# Patient Record
Sex: Male | Born: 1955 | Race: White | Hispanic: No | Marital: Married | Smoking: Current every day smoker
Health system: Southern US, Community
[De-identification: ages and names within clinical notes are randomized; demographics above are authoritative.]

## PROBLEM LIST (undated history)

## (undated) DIAGNOSIS — I639 Cerebral infarction, unspecified: Secondary | ICD-10-CM

## (undated) DIAGNOSIS — Z860100 Personal history of colon polyps, unspecified: Secondary | ICD-10-CM

## (undated) DIAGNOSIS — E785 Hyperlipidemia, unspecified: Secondary | ICD-10-CM

## (undated) DIAGNOSIS — R7989 Other specified abnormal findings of blood chemistry: Secondary | ICD-10-CM

## (undated) DIAGNOSIS — I10 Essential (primary) hypertension: Secondary | ICD-10-CM

## (undated) DIAGNOSIS — R945 Abnormal results of liver function studies: Secondary | ICD-10-CM

## (undated) DIAGNOSIS — F419 Anxiety disorder, unspecified: Secondary | ICD-10-CM

## (undated) DIAGNOSIS — R229 Localized swelling, mass and lump, unspecified: Secondary | ICD-10-CM

## (undated) HISTORY — DX: Abnormal results of liver function studies: R94.5

## (undated) HISTORY — DX: Cerebral infarction, unspecified: I63.9

## (undated) HISTORY — DX: Personal history of colonic polyps: Z86.010

## (undated) HISTORY — PX: CERVICAL DISC SURGERY: SHX588

## (undated) HISTORY — DX: Other specified abnormal findings of blood chemistry: R79.89

## (undated) HISTORY — DX: Personal history of colon polyps, unspecified: Z86.0100

## (undated) HISTORY — DX: Hyperlipidemia, unspecified: E78.5

## (undated) HISTORY — DX: Anxiety disorder, unspecified: F41.9

## (undated) HISTORY — DX: Essential (primary) hypertension: I10

## (undated) HISTORY — PX: NECK SURGERY: SHX720

## (undated) HISTORY — DX: Localized swelling, mass and lump, unspecified: R22.9

## (undated) HISTORY — PX: TOTAL HIP ARTHROPLASTY: SHX124

## (undated) HISTORY — PX: LEG SURGERY: SHX1003

---

## 1998-05-11 ENCOUNTER — Ambulatory Visit (HOSPITAL_COMMUNITY): Admission: RE | Admit: 1998-05-11 | Discharge: 1998-05-11 | Payer: Self-pay | Admitting: Unknown Physician Specialty

## 2001-08-03 ENCOUNTER — Emergency Department (HOSPITAL_COMMUNITY): Admission: EM | Admit: 2001-08-03 | Discharge: 2001-08-03 | Payer: Self-pay | Admitting: Emergency Medicine

## 2001-08-03 ENCOUNTER — Encounter: Payer: Self-pay | Admitting: Emergency Medicine

## 2001-08-14 ENCOUNTER — Observation Stay (HOSPITAL_COMMUNITY): Admission: RE | Admit: 2001-08-14 | Discharge: 2001-08-15 | Payer: Self-pay | Admitting: Neurosurgery

## 2001-08-14 ENCOUNTER — Encounter: Payer: Self-pay | Admitting: Neurosurgery

## 2006-06-12 ENCOUNTER — Ambulatory Visit (HOSPITAL_COMMUNITY): Admission: RE | Admit: 2006-06-12 | Discharge: 2006-06-12 | Payer: Self-pay | Admitting: Gastroenterology

## 2006-06-12 ENCOUNTER — Encounter (INDEPENDENT_AMBULATORY_CARE_PROVIDER_SITE_OTHER): Payer: Self-pay | Admitting: *Deleted

## 2008-06-24 ENCOUNTER — Inpatient Hospital Stay (HOSPITAL_COMMUNITY): Admission: RE | Admit: 2008-06-24 | Discharge: 2008-06-26 | Payer: Self-pay | Admitting: Orthopaedic Surgery

## 2010-10-19 ENCOUNTER — Ambulatory Visit (HOSPITAL_COMMUNITY): Payer: PRIVATE HEALTH INSURANCE

## 2010-10-19 ENCOUNTER — Ambulatory Visit (HOSPITAL_COMMUNITY)
Admission: RE | Admit: 2010-10-19 | Discharge: 2010-10-19 | Disposition: A | Discharge status: Home / Self Care | Payer: PRIVATE HEALTH INSURANCE | Source: Ambulatory Visit | Attending: Orthopaedic Surgery | Admitting: Orthopaedic Surgery

## 2010-10-19 DIAGNOSIS — I10 Essential (primary) hypertension: Secondary | ICD-10-CM | POA: Insufficient documentation

## 2010-10-19 DIAGNOSIS — F17200 Nicotine dependence, unspecified, uncomplicated: Secondary | ICD-10-CM | POA: Insufficient documentation

## 2010-10-19 DIAGNOSIS — F411 Generalized anxiety disorder: Secondary | ICD-10-CM | POA: Insufficient documentation

## 2010-10-19 DIAGNOSIS — Z01812 Encounter for preprocedural laboratory examination: Secondary | ICD-10-CM | POA: Insufficient documentation

## 2010-10-19 DIAGNOSIS — S8263XA Displaced fracture of lateral malleolus of unspecified fibula, initial encounter for closed fracture: Secondary | ICD-10-CM | POA: Insufficient documentation

## 2010-10-19 DIAGNOSIS — Z01818 Encounter for other preprocedural examination: Secondary | ICD-10-CM | POA: Insufficient documentation

## 2010-10-19 DIAGNOSIS — X58XXXA Exposure to other specified factors, initial encounter: Secondary | ICD-10-CM | POA: Insufficient documentation

## 2010-10-19 DIAGNOSIS — Z01810 Encounter for preprocedural cardiovascular examination: Secondary | ICD-10-CM | POA: Insufficient documentation

## 2010-10-19 LAB — BASIC METABOLIC PANEL
CO2: 33 mEq/L — ABNORMAL HIGH (ref 19–32)
Calcium: 9.2 mg/dL (ref 8.4–10.5)
Creatinine, Ser: 0.97 mg/dL (ref 0.4–1.5)
GFR calc Af Amer: >60 mL/min (ref >60)
GFR calc non Af Amer: >60 mL/min (ref >60)
Sodium: 139 mEq/L (ref 135–145)

## 2010-10-19 LAB — CBC
HCT: 51.3 % (ref 39.0–52.0)
Hemoglobin: 18.4 g/dL — ABNORMAL HIGH (ref 13.0–17.0)
MCH: 34.8 pg — ABNORMAL HIGH (ref 26.0–34.0)
MCHC: 35.9 g/dL (ref 30.0–36.0)
MCV: 97.2 fL (ref 78.0–100.0)
Platelets: 235 10*3/uL (ref 150–400)
RBC: 5.28 MIL/uL (ref 4.22–5.81)
RDW: 12.6 % (ref 11.5–15.5)
WBC: 8.3 10*3/uL (ref 4.0–10.5)

## 2010-10-19 LAB — SURGICAL PCR SCREEN
MRSA, PCR: NEGATIVE
Staphylococcus aureus: NEGATIVE

## 2010-11-22 NOTE — Op Note (Signed)
Patrick Padilla, NEAR                 ACCOUNT NO.:  [id number]   MEDICAL RECORD NO.:  [id number]          PATIENT TYPE:  INP   LOCATION:  2899                         FACILITY:  [facility]   PHYSICIAN:  Mark C. Ophelia Charter, M.D.    DATE OF BIRTH:  Jul 10, 1956   DATE OF PROCEDURE:  06/24/2008  DATE OF DISCHARGE:                               OPERATIVE REPORT   PREOPERATIVE DIAGNOSIS:  Left hip avascular necrosis, (Fryckman VI).   POSTOPERATIVE DIAGNOSIS:  Left hip avascular necrosis, (Fryckman VI).   PROCEDURE:  Left total hip arthroplasty.   SURGEON:  Mark C. Ophelia Charter, MD   ASSISTANT:  Wende Neighbors, PA   ANESTHESIA:  GOT plus Marcaine, skin local.   ESTIMATED BLOOD LOSS:  300 mL.   DRAINS:  None.   COMPONENTS USED:  DePuy Pinnacle 100 series acetabular two-piece,  acetabulum 58-mm external diameter.  A 40 x 58 mm metal shell.  Summit  size 7 femoral prosthesis, high offset with 40-mm ball, plus 8.5 mm  neck.   PROCEDURE:  After induction of general anesthesia and orotracheal  intubation, the patient placed in lateral position with axillary roll.  Standard prep and drape was performed in the usual fashion.  Foley was  inserted after the patient was intubated and before he was placed in  lateral position.  Prepping and draping of impervious stockinette,  Coban, split sheets, drapes, sterile skin marker, and Betadine Vi-Drapes  x2 was used to seal the lateral hip and buttocks as well as the groin.  Surgical checklist was completed, 2 g of Ancef was given.  Posterior  approach was made.  Gluteus maximus was split in line with the fibers.  Piriformis was tagged and released.  Posterior capsule was thick and  there was clear fluid in the joint under some mild pressure, no  purulence.  Short external rotators had multiple small bleeders that had  to be coagulated.  Large Steinmann pin was placed in the pelvis  underneath the medius perpendicular to the floor and then a drill bit  selected,  pounded into the trochanter parallel to the initial pin and a  measurement used for neck length was initially made.  This measured 58  mm at the beginning.  Final prosthesis inserted was 60-mm since the  patient was slightly short due to AVN and some collapse of the hip.  Posterior capsule was removed.  The hip was dislocated.  Neck was cut  slightly less than a fingerbreadth above the lesser troch.  Sequential  reaming of the femur was done,  first calcar reamer reaming up 6 to 7  and broaching up to 7, which gave excellent rotational stability.  Spine  was placed down in the femoral canal and acetabulum was prepared  removing hypertrophic ligamentum.  There was some very thick capsule  anteriorly and the hip joint was excised with Bovie electrocautery.  Anterior spurs were present.  The patient had essentially no internal  rotation of the hip.  Anterior spurs removed with a half-inch osteotome  and sequential reaming was performed for the 58-mm cup.  Trials  were  inserted at 90 degrees flexion.  He bumped into the post and would  lever.  This was the anterior post used to secure the patient to keep  him in lateral position, 85 degrees flexion.  He then internally rotated  to 80 degrees and then began to lever out off the anterior osteophytes  and these were the osteophytes that were removed at this time with the  osteotome as previously described.  Once the osteotome was removed,  there was excellent stability.  No shuck, full extension, quad was not  tight.  Measurement showed he had been lengthened to 3 mm.  Trials were  used and this was 8.5 neck length high offset that was selected.  Permanent femur was then inserted after central hole eliminator was  filled and the metal 40-mm liner with suction device was inserted after  it was meticulously cleaned and dried and there was excellent __________  packed in position, was stable, checked with a Glorious Peach, and was secure.  Permanent stem was  impacted making sure appropriate version was kept in  the femur.  The +8.5 40-mm ball was impacted into place.  Hip was  reduced and again the hip was flexed to 85 degrees, this time internally  rotated to 85 degrees before we began to sublux.  Small amount of  anterior capsule was resected, which was thick, and leg length again  showed that he had been almost restored,he was about 5 mm short at the  beginning of the case, he was lengthened 2-3 mm with prosthesis that was  selected.  There was good hip stability.  On extension and external  rotation, there was no anterior instability.  Wound was irrigated.  Piriformis was repaired with free needle back to gluteus medius.  Tensor  fascia was repaired with nonabsorbable Tycron or Ethibond sutures #1.  A  0 Vicryl in the gluteus maximus fascia, 2-0 subcutaneous sutures for  subcuticular skin closure.  Tincture of Benzoin, Steri-Strips, Marcaine  infiltration, postop dressing, knee immobilizer.  Instrument count and  needle count was correct.      Mark C. Ophelia Charter, M.D.  Electronically Signed     MCY/MEDQ  D:  06/24/2008  T:  06/25/2008  Job:  [id number]

## 2010-11-25 NOTE — H&P (Signed)
[place]. [facility]  Patient:    Patrick Padilla, Patrick Padilla Visit Number: [id number] MRN: [id number]          Service Type: OBV Location: 3000 3020 01 Attending Physician:  Coletta Memos Dictated by:   Mena Goes. Franky Macho, M.D. Admit Date:  08/14/2001                           History and Physical  ADMITTING DIAGNOSES: 1. Right C7 radiculopathy. 2. Displaced disk, right C6-7.  INDICATION:  Patrick Padilla is a 55 year old gentleman who presented with a three-week history of searing pain down the right upper extremity into the hand.  He has had the pain every since he sneezed that morning.  He has had no previous problem with neck pain or arm pain.  He is self-employed and Publishing rights manager.  He is right-handed and does have some weakness in the right upper extremity.  PAST MEDICAL HISTORY:  Excellent.  ALLERGIES:  He has no known drug allergies.  MEDICATIONS: 1. Voltaren 75 mg b.i.d. 2. Percocet. 3. Zanaflex one-half to one tablet twice a day.  FAMILY HISTORY:  Mother is 39 and is in good health.  Father is deceased. Diabetes present in the family history.  He reports tingling in the right arm.  SOCIAL HISTORY:  He smokes two packs of cigarettes per day.  He drinks alcohol daily.  Does not use illicit drugs.  Six feet tall and weighs 180 pounds.  REVIEW OF SYSTEMS:  Positive for eye glasses, hearing loss, arm weakness, arm pain and neck pain.  Denies constitutional, cardiovascular, respiratory, gastrointestinal, genitourinary, skin, neurological, psychiatric, endocrine, hematologic or allergic problems.  PHYSICAL EXAMINATION:  VITAL SIGNS:  He has a pulse of 80.  GENERAL:  He is alert, oriented x4 and answers all questions appropriately. He is in moderate distress.  He is holding his neck slightly flex and rotated towards the right side.  He also had a on soft cervical color.  NEUROLOGIC:  Weakness of the right triceps is 4/5, otherwise, good strength; 4/5  wrist extensors, 4+/5 otherwise; good strength in the right upper extremity; normal strength in the left upper and both lower extremities. There is no Hoffmanns sign elicited.  He has normal muscle tone, bulk and coordination.  Romberg test is negative.  He can tandem walk without difficulty.  Deep tendon reflexes are 2+ at the biceps, triceps, brachioradialis, knees and ankles.  Toes are downgoing to plantar stimulation. Pinprick and proprioception are intact.  NECK:  There are no cervical masses or bruits.  LUNGS:  Lung fields are clear.  HEART:  Regular rhythm and rate.  No murmurs or rubs.  Pulses are good at the wrists and feet bilaterally.  LABORATORY AND ACCESSORY DATA:  MRI shows a large herniated disk at C6-7 on the right side.  He also has some spondylitic change present at C6-7 and at C5-6.  ASSESSMENT AND PLAN:  I recommended to Mr. Gettel that he undergo operative decompression via an anterior approach with arthrodesis and plating due to the weakness in the right triceps, pain in the right upper extremity and a large disk herniation.  He has agreed.  Risks of the procedure including bleeding, infection, no pain relief, bowel or bladder dysfunction and need for further surgery were discussed.  He understands and wishes to proceed. Dictated by:   Mena Goes. Franky Macho, M.D. Attending Physician:  Coletta Memos DD:  08/14/01 TD:  08/15/01 Job: [id number]

## 2010-11-25 NOTE — Op Note (Signed)
[place]. [facility]  Patient:    Patrick Padilla Visit Number: [id number] MRN: [id number]          Service Type: OBV Location: 3000 3020 01 Attending Physician:  Coletta Memos Dictated by:   Mena Goes. Franky Macho, M.D. Proc. Date: 08/14/01 Admit Date:  08/14/2001 Discharge Date: 08/15/2001                             Operative Report  PREOPERATIVE DIAGNOSES: 1. Displaced disk at right C6-7. 2. Right C7 radiculopathy.  POSTOPERATIVE DIAGNOSES: 1. Displaced disk at right C6-7. 2. Right C7 radiculopathy.  PROCEDURE:  Anterior cervical diskectomy at C6-7, arthrodesis C6-7 with anterior plating, 16 mm Synthes standard plate, and 8 mm ______ allograft.  COMPLICATIONS:  None.  SURGEON:  Kyle L. Franky Macho, M.D.  ASSISTANT:  Payton Doughty, M.D.  ANESTHESIA:  General endotracheal.  INDICATIONS FOR PROCEDURE:  Patrick Padilla is a 55 year old gentleman with a three week history of severe pain in the right upper extremity consistent with a right C6-7 radiculopathy.  MRI showed a large herniated disk at C6-7 eccentric to the right side.  I recommended, and he has agreed, to undergo and anterior cervical diskectomy and arthrodesis.  DESCRIPTION OF PROCEDURE:  Patrick Padilla was brought to the operating room, intubated, and placed under general anesthesia without difficulty.  His head was positioned in slight extension with 10 pounds of traction applied via ______ on a horseshoe head rest.  His neck was prepped, and he was draped in a sterile fashion.  Infiltrated 4 cc of 0.50% lidocaine with 1:200,000 strength epinephrine into my proposed incision at his lower skin crease on the neck two fingerbreadths above the sternal notch.  I made my incision with a #10 blade, and took this down to the platysma.  I dissected above the platysma with Metzenbaum scissors.  I then opened the platysma in a horizontal fashion. I dissected inferior to the platysma both longitudinally and  caudally.  I then developed a plane between the medial strap muscles, the omohyoid, and the sternocleidomastoid.  I retracted those muscles medially, and using a Kidner was able to clean soft tissue from the anterior cervical spine.  After exposing the disk space, I placed a needle into that disk space.  X-rays showed that I was at C6-7.  I then proceeded to place the self-retaining Cloward retractors after reflecting the longus coli muscles laterally surrounding C6-7.  I then placed self-retaining McCloward retractor.  I placed through distraction pin, one at C6, one at C7, and distracted the disk space. I opened the disk space with a #15 blade, and starting removing disk material. I brought the microscope into the operative field, and using curettes, pituitary rongeurs and Kerrison punches removed more soft disk.  After identifying the posterior longitudinal ligament, I used curettes again, and was able to develop a plane where I could get a Kerrison punch underneath the posterior longitudinal ligament.  ______ ______  for this portion of the procedure.  I was able to also remove three large fragments of disk material lodge over the right C7 nerve root.  The nerve roots were then decompressed bilaterally, and after I felt there was adequate decompression, I inspected both neural foramins.  After that was done, I placed an 8 mm ______ graft into the disk space.  I removed the distraction pins.  The weight was taken off of the traction device.  I then placed  a 60 mm plate, Synthes ______ first by drilling, tapping, and then placing the screws.  This was also done with the assistance of Dr. Trey Sailors.  I then irrigated the wound once more. Took another x-ray.  The locking screws had been placed.  X-rays showed the screws to be in good position just at the inferior surface of the vertebral end plate on C6.  Irrigated again.  I then closed the wound in layered fashion after ensuring hemostasis.   Platysma was reapproximated.  Subcutaneous tissue reapproximated.  Dermabond used for a sterile incision.  The patient was then awakened, extubated, and moving all extremities well. Dictated by:   Mena Goes. Franky Macho, M.D. Attending Physician:  Coletta Memos DD:  08/14/01 TD:  08/15/01 Job: [id number]

## 2011-04-13 LAB — CBC
HCT: 32.6 % — ABNORMAL LOW (ref 39.0–52.0)
HCT: 33.8 % — ABNORMAL LOW (ref 39.0–52.0)
HCT: 50.3 % (ref 39.0–52.0)
Hemoglobin: 11.1 g/dL — ABNORMAL LOW (ref 13.0–17.0)
MCHC: 34.2 g/dL (ref 30.0–36.0)
MCV: 100.3 fL — ABNORMAL HIGH (ref 78.0–100.0)
Platelets: 244 10*3/uL (ref 150–400)
RBC: 3.37 MIL/uL — ABNORMAL LOW (ref 4.22–5.81)
RDW: 12.8 % (ref 11.5–15.5)
RDW: 13 % (ref 11.5–15.5)
WBC: 8.6 10*3/uL (ref 4.0–10.5)
WBC: 9.9 10*3/uL (ref 4.0–10.5)

## 2011-04-13 LAB — BASIC METABOLIC PANEL
CO2: 28 mEq/L (ref 19–32)
Chloride: 102 mEq/L (ref 96–112)
Chloride: 108 mEq/L (ref 96–112)
GFR calc Af Amer: >60 mL/min (ref >60)
GFR calc non Af Amer: >60 mL/min (ref >60)
Glucose, Bld: 131 mg/dL — ABNORMAL HIGH (ref 70–99)
Potassium: 3.5 mEq/L (ref 3.5–5.1)
Potassium: 3.6 mEq/L (ref 3.5–5.1)
Sodium: 136 mEq/L (ref 135–145)

## 2011-04-13 LAB — COMPREHENSIVE METABOLIC PANEL
AST: 32 U/L (ref 0–37)
Albumin: 4.2 g/dL (ref 3.5–5.2)
Alkaline Phosphatase: 133 U/L — ABNORMAL HIGH (ref 39–117)
BUN: 8 mg/dL (ref 6–23)
GFR calc Af Amer: >60 mL/min (ref >60)
Potassium: 3.6 mEq/L (ref 3.5–5.1)
Total Protein: 6.4 g/dL (ref 6.0–8.3)

## 2011-04-13 LAB — DIFFERENTIAL
Basophils Relative: 1 % (ref 0–1)
Eosinophils Relative: 3 % (ref 0–5)
Lymphocytes Relative: 19 % (ref 12–46)
Monocytes Absolute: 0.5 10*3/uL (ref 0.1–1.0)
Monocytes Relative: 5 % (ref 3–12)
Neutro Abs: 7.2 10*3/uL (ref 1.7–7.7)

## 2011-04-13 LAB — APTT: aPTT: 26 seconds (ref 24–37)

## 2011-05-15 ENCOUNTER — Other Ambulatory Visit: Payer: Self-pay | Admitting: Otolaryngology

## 2011-05-22 ENCOUNTER — Ambulatory Visit
Admission: RE | Admit: 2011-05-22 | Discharge: 2011-05-22 | Disposition: A | Discharge status: Home / Self Care | Payer: PRIVATE HEALTH INSURANCE | Source: Ambulatory Visit | Attending: Otolaryngology | Admitting: Otolaryngology

## 2011-05-22 MED ORDER — IOHEXOL 300 MG/ML  SOLN
75.0000 mL | Freq: Once | INTRAMUSCULAR | Status: AC | PRN
  Administered 2011-05-22: 75 mL via INTRAVENOUS

## 2011-12-26 ENCOUNTER — Encounter: Payer: Self-pay | Admitting: Gastroenterology

## 2011-12-26 ENCOUNTER — Ambulatory Visit (INDEPENDENT_AMBULATORY_CARE_PROVIDER_SITE_OTHER): Payer: PRIVATE HEALTH INSURANCE | Admitting: Gastroenterology

## 2011-12-26 ENCOUNTER — Other Ambulatory Visit (INDEPENDENT_AMBULATORY_CARE_PROVIDER_SITE_OTHER): Payer: PRIVATE HEALTH INSURANCE

## 2011-12-26 VITALS — BP 100/58 | HR 108 | Ht 72.0 in | Wt 194.0 lb

## 2011-12-26 DIAGNOSIS — R7989 Other specified abnormal findings of blood chemistry: Secondary | ICD-10-CM

## 2011-12-26 LAB — CBC WITH DIFFERENTIAL/PLATELET
Basophils Absolute: 0 10*3/uL (ref 0.0–0.1)
Eosinophils Absolute: 0.2 10*3/uL (ref 0.0–0.7)
Hemoglobin: 17.3 g/dL — ABNORMAL HIGH (ref 13.0–17.0)
Lymphocytes Relative: 19.7 % (ref 12.0–46.0)
Lymphs Abs: 2.1 10*3/uL (ref 0.7–4.0)
MCHC: 34.5 g/dL (ref 30.0–36.0)
Monocytes Absolute: 0.6 10*3/uL (ref 0.1–1.0)
Neutro Abs: 7.7 10*3/uL (ref 1.4–7.7)
RDW: 14.4 % (ref 11.5–14.6)

## 2011-12-26 LAB — COMPREHENSIVE METABOLIC PANEL
ALT: 52 U/L (ref 0–53)
AST: 42 U/L — ABNORMAL HIGH (ref 0–37)
Calcium: 9.7 mg/dL (ref 8.4–10.5)
Chloride: 102 mEq/L (ref 96–112)
Creatinine, Ser: 1 mg/dL (ref 0.4–1.5)
Potassium: 3.3 mEq/L — ABNORMAL LOW (ref 3.5–5.1)

## 2011-12-26 LAB — FERRITIN: Ferritin: 409.9 ng/mL — ABNORMAL HIGH (ref 22.0–322.0)

## 2011-12-26 LAB — IBC PANEL
Iron: 221 ug/dL — ABNORMAL HIGH (ref 42–165)
Saturation Ratios: 54.8 % — ABNORMAL HIGH (ref 20.0–50.0)
Transferrin: 287.8 mg/dL (ref 212.0–360.0)

## 2011-12-26 LAB — IGA: IgA: 279 mg/dL (ref 68–378)

## 2011-12-26 NOTE — Progress Notes (Signed)
HPI: This is a   very pleasant 56 year old man whom I am meeting for the first time today.  Had elevated liver tests last year also.   This year was also found to have slightly elevated liver tests, see below.  Never had cirrhsois, never jaundiced, no hepatitis.  His father had cirrhosis, he thinks.  He was an alcoholic.  No tatoos.   No remote blood tranfusions, never in [employer].      Labs earlier this month: Alk phos 151 (ULN 150) ast 56 Alt 69 tbili normal Ferritin 601 HCV ab <0.1)  US liver 12/2011 shows increased liver echogenicity, normal bile ducts, normal GB without stones.   He drinks 3-4 whisky drinks a day.  Used to drink a lot of beer.  He has never tried stopped.  He thinks he would have shakes if he stopped.  Started weaning down on etoh lately, was up to 5-6 whiskey's a day.    Review of systems: Pertinent positive and negative review of systems were noted in the above HPI section. Complete review of systems was performed and was otherwise normal.    Past Medical History  Diagnosis Date  . HTN (hypertension)   . Anxiety   . Localized superficial swelling, mass, or lump   . Elevated LFTs   . History of colon polyps   . HLD (hyperlipidemia)     Past Surgical History  Procedure Date  . Total hip arthroplasty     left  . Leg surgery     right leg and ankle  . Cervical disc surgery   . Neck surgery     cyst removed    Current Outpatient Prescriptions  Medication Sig Dispense Refill  . PARoxetine (PAXIL) 20 MG tablet Take 20 mg by mouth daily.        Allergies as of 12/26/2011  . (No Known Allergies)    Family History  Problem Relation Age of Onset  . Hepatitis Mother   . Clotting disorder Mother   . Stroke Mother   . Thyroid disease Mother   . Colon cancer Father     ?  Marland Kitchen Rheumatic fever Brother   . Heart disease Brother     History   Social History  . Marital Status: Married    Spouse Name: N/A    Number of Children: 2  . Years  of Education: N/A   Occupational History  . disabled    Social History Main Topics  . Smoking status: Current Everyday Smoker  . Smokeless tobacco: Never Used  . Alcohol Use: Yes     3-4 a day  . Drug Use: No  . Sexually Active: Not on file   Other Topics Concern  . Not on file   Social History Narrative  . No narrative on file       Physical Exam: BP 100/58  Pulse 108  Ht 6' (1.829 m)  Wt 194 lb (87.998 kg)  BMI 26.31 kg/m2 Constitutional: generally well-appearing Psychiatric: alert and oriented x3 Eyes: extraocular movements intact Mouth: oral pharynx moist, no lesions Neck: supple no lymphadenopathy Cardiovascular: heart regular rate and rhythm Lungs: clear to auscultation bilaterally Abdomen: soft, nontender, nondistended, no obvious ascites, no peritoneal signs, normal bowel sounds Extremities: no lower extremity edema bilaterally Skin: no lesions on visible extremities    Assessment and plan: 56 y.o. male with  elevated liver tests, alcoholic  He was drinking at least 5-6 whiskey drinks a day, lately has been cutting back. Still  drinks 3-4 a day. I suspect this habit is at least contributing to his elevated liver tests. He is going to get a battery of blood work to rule out other causes, see list below in the instructions. I recommended he try to completely quit drinking alcohol as well stop smoking. We will call him with the results of his liver tests and will arrange followup for her.

## 2011-12-26 NOTE — Patient Instructions (Addendum)
One of your biggest health concerns is your smoking.  This increases your risk for most cancers and serious cardiovascular diseases such as strokes, heart attacks.  You should try your best to stop.  If you need assistance, please contact your PCP or Smoking Cessation Class at [facility] [phone]) or [facility] Quit-Line (1-800-QUIT-NOW). Alcohol consumption MAY be contributing or causing your liver test elevation, you should try to completely stop drinking alcohol for the next 6-7 weeks and then we will recheck your bloodwork (LFTS) You will get labs drawn today:  Hepatitis A (IgM and IgG), Hepatitis B surface antigen, Hepatitis B surface antibody, Hepatitis C antibody, total iron, ferritin, TIBC, ANA, AMA, alphafeto protein (AFP), anti smooth muscle antibody, alpha 1 antitrypsin, cerulloplasm, TTG, total IgA level, CBC, CMET, INR. A copy of this information will be made available to Dr. Andi Devon.

## 2011-12-27 LAB — HEPATITIS B SURFACE ANTIGEN: Hepatitis B Surface Ag: NEGATIVE

## 2011-12-27 LAB — ANTI-SMOOTH MUSCLE ANTIBODY, IGG: Smooth Muscle Ab: 2 U (ref <20)

## 2011-12-27 LAB — HEPATITIS A ANTIBODY, TOTAL: Hep A Total Ab: NEGATIVE

## 2011-12-27 LAB — TISSUE TRANSGLUTAMINASE, IGA: Tissue Transglutaminase Ab, IgA: 4.5 U/mL (ref <20)

## 2011-12-27 LAB — HEPATITIS C ANTIBODY: HCV Ab: NEGATIVE

## 2012-02-13 ENCOUNTER — Telehealth: Payer: Self-pay

## 2012-02-13 NOTE — Telephone Encounter (Signed)
Message copied by Donata Duff on Tue Feb 13, 2012  8:04 AM ------      Message from: Donata Duff      Created: Tue Dec 26, 2011 10:41 AM       Pt to get repeat labs

## 2012-02-14 NOTE — Telephone Encounter (Signed)
Pt has been notified to have lab work

## 2012-04-09 ENCOUNTER — Telehealth: Payer: Self-pay

## 2012-04-09 NOTE — Telephone Encounter (Signed)
Pt does not want to schedule the labs or the ROV and will call when ready

## 2012-04-09 NOTE — Telephone Encounter (Signed)
Message copied by Donata Duff on Tue Apr 09, 2012  9:13 AM ------      Message from: Donata Duff      Created: Mon Jan 08, 2012 10:05 AM       Pt needs appt and labs see 01/08/12 result note

## 2017-08-23 DIAGNOSIS — E559 Vitamin D deficiency, unspecified: Secondary | ICD-10-CM | POA: Insufficient documentation

## 2017-08-25 DIAGNOSIS — F1010 Alcohol abuse, uncomplicated: Secondary | ICD-10-CM | POA: Insufficient documentation

## 2017-08-25 DIAGNOSIS — Z720 Tobacco use: Secondary | ICD-10-CM | POA: Insufficient documentation

## 2017-08-25 DIAGNOSIS — Z9189 Other specified personal risk factors, not elsewhere classified: Secondary | ICD-10-CM | POA: Insufficient documentation

## 2018-01-15 DIAGNOSIS — Z8739 Personal history of other diseases of the musculoskeletal system and connective tissue: Secondary | ICD-10-CM | POA: Insufficient documentation

## 2019-02-03 ENCOUNTER — Emergency Department (HOSPITAL_COMMUNITY): Payer: Medicare Other

## 2019-02-03 ENCOUNTER — Inpatient Hospital Stay (HOSPITAL_COMMUNITY)
Admission: EM | Admit: 2019-02-03 | Discharge: 2019-02-05 | DRG: 310 | Disposition: A | Discharge status: Home / Self Care | Payer: Medicare Other | Attending: Cardiovascular Disease | Admitting: Cardiovascular Disease

## 2019-02-03 ENCOUNTER — Other Ambulatory Visit: Payer: Self-pay

## 2019-02-03 ENCOUNTER — Encounter (HOSPITAL_COMMUNITY): Payer: Self-pay | Admitting: *Deleted

## 2019-02-03 DIAGNOSIS — Z79899 Other long term (current) drug therapy: Secondary | ICD-10-CM | POA: Diagnosis not present

## 2019-02-03 DIAGNOSIS — I4811 Longstanding persistent atrial fibrillation: Secondary | ICD-10-CM

## 2019-02-03 DIAGNOSIS — R001 Bradycardia, unspecified: Secondary | ICD-10-CM | POA: Diagnosis present

## 2019-02-03 DIAGNOSIS — I4819 Other persistent atrial fibrillation: Secondary | ICD-10-CM | POA: Diagnosis present

## 2019-02-03 DIAGNOSIS — Z7982 Long term (current) use of aspirin: Secondary | ICD-10-CM

## 2019-02-03 DIAGNOSIS — Z832 Family history of diseases of the blood and blood-forming organs and certain disorders involving the immune mechanism: Secondary | ICD-10-CM

## 2019-02-03 DIAGNOSIS — Z7289 Other problems related to lifestyle: Secondary | ICD-10-CM | POA: Diagnosis not present

## 2019-02-03 DIAGNOSIS — Z96642 Presence of left artificial hip joint: Secondary | ICD-10-CM | POA: Diagnosis present

## 2019-02-03 DIAGNOSIS — Z823 Family history of stroke: Secondary | ICD-10-CM

## 2019-02-03 DIAGNOSIS — E785 Hyperlipidemia, unspecified: Secondary | ICD-10-CM | POA: Diagnosis present

## 2019-02-03 DIAGNOSIS — Z86010 Personal history of colonic polyps: Secondary | ICD-10-CM | POA: Diagnosis not present

## 2019-02-03 DIAGNOSIS — Z20828 Contact with and (suspected) exposure to other viral communicable diseases: Secondary | ICD-10-CM | POA: Diagnosis present

## 2019-02-03 DIAGNOSIS — I10 Essential (primary) hypertension: Secondary | ICD-10-CM | POA: Diagnosis present

## 2019-02-03 DIAGNOSIS — Z800 Family history of malignant neoplasm of digestive organs: Secondary | ICD-10-CM

## 2019-02-03 DIAGNOSIS — F419 Anxiety disorder, unspecified: Secondary | ICD-10-CM | POA: Diagnosis present

## 2019-02-03 DIAGNOSIS — Z8249 Family history of ischemic heart disease and other diseases of the circulatory system: Secondary | ICD-10-CM

## 2019-02-03 DIAGNOSIS — F17210 Nicotine dependence, cigarettes, uncomplicated: Secondary | ICD-10-CM | POA: Diagnosis present

## 2019-02-03 DIAGNOSIS — I4891 Unspecified atrial fibrillation: Secondary | ICD-10-CM | POA: Diagnosis present

## 2019-02-03 DIAGNOSIS — E7800 Pure hypercholesterolemia, unspecified: Secondary | ICD-10-CM | POA: Diagnosis not present

## 2019-02-03 LAB — TROPONIN I (HIGH SENSITIVITY)
Troponin I (High Sensitivity): 5 ng/L (ref <18)
Troponin I (High Sensitivity): 3 ng/L (ref <18)

## 2019-02-03 LAB — BASIC METABOLIC PANEL
Anion gap: 14 (ref 5–15)
BUN: 7 mg/dL — ABNORMAL LOW (ref 8–23)
CO2: 18 mmol/L — ABNORMAL LOW (ref 22–32)
Calcium: 9.2 mg/dL (ref 8.9–10.3)
Chloride: 108 mmol/L (ref 98–111)
Creatinine, Ser: 0.95 mg/dL (ref 0.61–1.24)
GFR calc Af Amer: >60 mL/min (ref >60)
GFR calc non Af Amer: >60 mL/min (ref >60)
Glucose, Bld: 125 mg/dL — ABNORMAL HIGH (ref 70–99)
Potassium: 4.4 mmol/L (ref 3.5–5.1)
Sodium: 140 mmol/L (ref 135–145)

## 2019-02-03 LAB — CBC
HCT: 52.4 % — ABNORMAL HIGH (ref 39.0–52.0)
Hemoglobin: 17.6 g/dL — ABNORMAL HIGH (ref 13.0–17.0)
MCH: 33 pg (ref 26.0–34.0)
MCHC: 33.6 g/dL (ref 30.0–36.0)
MCV: 98.1 fL (ref 80.0–100.0)
Platelets: 250 10*3/uL (ref 150–400)
RBC: 5.34 MIL/uL (ref 4.22–5.81)
RDW: 12.2 % (ref 11.5–15.5)
WBC: 18 10*3/uL — ABNORMAL HIGH (ref 4.0–10.5)
nRBC: 0 % (ref 0.0–0.2)

## 2019-02-03 LAB — SARS CORONAVIRUS 2 BY RT PCR (HOSPITAL ORDER, PERFORMED IN ~~LOC~~ HOSPITAL LAB): SARS Coronavirus 2: NEGATIVE

## 2019-02-03 LAB — D-DIMER, QUANTITATIVE: D-Dimer, Quant: 0.45 ug/mL-FEU (ref 0.00–0.50)

## 2019-02-03 LAB — BRAIN NATRIURETIC PEPTIDE: B Natriuretic Peptide: 103.1 pg/mL — ABNORMAL HIGH (ref 0.0–100.0)

## 2019-02-03 MED ORDER — DILTIAZEM LOAD VIA INFUSION
15.0000 mg | Freq: Once | INTRAVENOUS | Status: AC
  Administered 2019-02-03: 15 mg via INTRAVENOUS
  Filled 2019-02-03: qty 15

## 2019-02-03 MED ORDER — PAROXETINE HCL 20 MG PO TABS
20.0000 mg | ORAL_TABLET | Freq: Every day | ORAL | Status: DC
  Administered 2019-02-05: 20 mg via ORAL
  Filled 2019-02-03 (×3): qty 1

## 2019-02-03 MED ORDER — METOPROLOL TARTRATE 25 MG PO TABS
25.0000 mg | ORAL_TABLET | Freq: Two times a day (BID) | ORAL | Status: DC
  Administered 2019-02-03 – 2019-02-04 (×2): 25 mg via ORAL
  Filled 2019-02-03 (×2): qty 1

## 2019-02-03 MED ORDER — DILTIAZEM HCL-DEXTROSE 100-5 MG/100ML-% IV SOLN (PREMIX)
5.0000 mg/h | INTRAVENOUS | Status: DC
  Administered 2019-02-03: 5 mg/h via INTRAVENOUS
  Filled 2019-02-03: qty 100

## 2019-02-03 NOTE — ED Triage Notes (Signed)
Pt here for central chest aching, especially when taking a deep breath, and shortness of breath that began this morning while sitting in his recliner. Denies sick contacts.

## 2019-02-03 NOTE — H&P (Signed)
Cardiology Admission History and Physical:   Patient ID: Patrick BucklesJoel Langland; MRN: [id number]; DOB: June 14, 1956   Admission date: 02/03/2019  Primary Care Provider: Lavell Islamloward, Davis L, MD Primary Cardiologist: No primary care provider on file.   Chief Complaint:  Chest discomfort  History of Present Illness:   Patrick Padilla is a 63 y.o. male with a history of hypertension, hyperlipidemia, colon polyps and anxiety who presents to the hospital with complaints of chest discomfort. The patient reports that the pain started in the morning while he was lying in a recliner. The pain was localized to the center of the chest. There was no radiation of the pain and it was associated with some shortness of breath. He denied any nausea, vomiting, diaphoresis or palpitations. He had no leg swelling. He also did not report any dizziness or syncope.  He takes ASA for primary prevention. He denies any history of MI or stroke. He is on a statin for hyperlipidemia. He denies having a diagnosis of hypertension. He smokes about 2 packs per day and drinks 3-4 beers per night. His alcohol intake has been relatively stable over the past few months. He is currently on disability. No sick contacts at home. The patient does not have any fevers, chills or travel history outside of [name].  In the ED he was noted to have atrial fibrillation with RVR and the ECG documented a rate of 150 bpm with low voltage in the precordial leads. The BNP was 103.1 with a high sensitivity troponin of 3. The WBC was elevated at 18.0. The CXR revealed no acute cardiopulmonary disease. He was started on an IV diltiazem infusion for rate control.    Past Medical History:  Diagnosis Date  . Anxiety   . Elevated LFTs   . History of colon polyps   . HLD (hyperlipidemia)   . HTN (hypertension)   . Localized superficial swelling, mass, or lump     Past Surgical History:  Procedure Laterality Date  . CERVICAL DISC SURGERY    . LEG SURGERY      right leg and ankle  . NECK SURGERY     cyst removed  . TOTAL HIP ARTHROPLASTY     left     Medications Prior to Admission: Prior to Admission medications   Medication Sig Start Date End Date Taking? Authorizing Provider  aspirin EC 81 MG tablet Take 81 mg by mouth daily.   Yes [provider]  Multiple Vitamin (MULTIVITAMIN) tablet Take 1 tablet by mouth daily.   Yes [provider]  OVER THE COUNTER MEDICATION Take 2 tablets by mouth daily. Equate brand   Yes [provider]  PARoxetine (PAXIL) 20 MG tablet Take 20 mg by mouth daily.   Yes [provider]  simvastatin (ZOCOR) 20 MG tablet Take 20 mg by mouth daily.   Yes [provider]     Allergies:   No Known Allergies  Social History:   Social History   Socioeconomic History  . Marital status: Married    Spouse name: Not on file  . Number of children: 2  . Years of education: Not on file  . Highest education level: Not on file  Occupational History  . Occupation: disabled    Associate Professormployer: SELF-EMPLOYED  Social Needs  . Financial resource strain: Not on file  . Food insecurity    Worry: Not on file    Inability: Not on file  . Transportation needs    Medical: Not on file  Non-medical: Not on file  Tobacco Use  . Smoking status: Current Every Day Smoker  . Smokeless tobacco: Never Used  Substance and Sexual Activity  . Alcohol use: Yes    Comment: 3-4 a day  . Drug use: No  . Sexual activity: Not on file  Lifestyle  . Physical activity    Days per week: Not on file    Minutes per session: Not on file  . Stress: Not on file  Relationships  . Social Herbalist on phone: Not on file    Gets together: Not on file    Attends religious service: Not on file    Active member of club or organization: Not on file    Attends meetings of clubs or organizations: Not on file    Relationship status: Not on file  . Intimate partner violence    Fear of current or  ex partner: Not on file    Emotionally abused: Not on file    Physically abused: Not on file    Forced sexual activity: Not on file  Other Topics Concern  . Not on file  Social History Narrative  . Not on file     Family History:  The patient's family history includes Clotting disorder in his mother; Colon cancer in his father; Heart disease in his brother; Hepatitis in his mother; Rheumatic fever in his brother; Stroke in his mother; Thyroid disease in his mother.     Review of Systems: [y] = yes, [ ]  = no   . General: Weight gain [ ] ; Weight loss [ ] ; Anorexia [ ] ; Fatigue [ ] ; Fever [ ] ; Chills [ ] ; Weakness [ ]   . Cardiac: Chest pain/pressure [Y]; Resting SOB [Y]; Exertional SOB [ ] ; Orthopnea [ ] ; Pedal Edema [ ] ; Palpitations [ ] ; Syncope [ ] ; Presyncope [ ] ; Paroxysmal nocturnal dyspnea[ ]   . Pulmonary: Cough [ ] ; Wheezing[ ] ; Hemoptysis[ ] ; Sputum [ ] ; Snoring [ ]   . GI: Vomiting[ ] ; Dysphagia[ ] ; Melena[ ] ; Hematochezia [ ] ; Heartburn[ ] ; Abdominal pain [ ] ; Constipation [ ] ; Diarrhea [ ] ; BRBPR [ ]   . GU: Hematuria[ ] ; Dysuria [ ] ; Nocturia[ ]   . Vascular: Pain in legs with walking [ ] ; Pain in feet with lying flat [ ] ; Non-healing sores [ ] ; Stroke [ ] ; TIA [ ] ; Slurred speech [ ] ;  . Neuro: Headaches[ ] ; Vertigo[ ] ; Seizures[ ] ; Paresthesias[ ] ;Blurred vision [ ] ; Diplopia [ ] ; Vision changes [ ]   . Ortho/Skin: Arthritis [ ] ; Joint pain [ ] ; Muscle pain [ ] ; Joint swelling [ ] ; Back Pain [ ] ; Rash [ ]   . Psych: Depression[ ] ; Anxiety[Y]  . Heme: Bleeding problems [ ] ; Clotting disorders [ ] ; Anemia [ ]   . Endocrine: Diabetes [ ] ; Thyroid dysfunction[ ]      Physical Exam/Data:   Vitals:   02/03/19 1845 02/03/19 1900 02/03/19 1915 02/03/19 1930  BP: 139/85 138/82 (!) 152/77 (!) 155/97  Pulse: (!) 122 72 (!) 124 (!) 128  Resp: (!) 25 (!) 25 (!) 23 (!) 24  Temp:      TempSrc:      SpO2: 96% 95% 95% 94%  Height:       No intake or output data in the 24 hours ending  02/03/19 2014 There were no vitals filed for this visit. Body mass index is 26.31 kg/m.  General:  Well nourished, well developed, mildly uncomfortable HEENT: normal Lymph: no adenopathy Neck: no JVD  Endocrine:  No thryomegaly Vascular: No carotid bruits; FA pulses 2+ bilaterally without bruits  Cardiac:  normal S1, S2; irregularly irregular rhythm, tachycardiac, no murmur Lungs:  Mild bibasilar crackles, no wheezing  Abd: soft, nontender, no hepatomegaly  Ext: no lower extremity edema Musculoskeletal:  No deformities, BUE and BLE strength normal and equal Skin: warm and dry  Neuro:  CNs 2-12 intact, no focal abnormalities noted Psych:  Normal affect    EKG:  The ECG that was done today was personally reviewed and demonstrates atrial fibrillation with RVR (rate 150 bpm), and low voltage in the precordial leads.   Laboratory Data:  Chemistry Recent Labs  Lab 02/03/19 1640  NA 140  K 4.4  CL 108  CO2 18*  GLUCOSE 125*  BUN 7*  CREATININE 0.95  CALCIUM 9.2  GFRNONAA >60  GFRAA >60  ANIONGAP 14    No results for input(s): PROT, ALBUMIN, AST, ALT, ALKPHOS, BILITOT in the last 168 hours. Hematology Recent Labs  Lab 02/03/19 1640  WBC 18.0*  RBC 5.34  HGB 17.6*  HCT 52.4*  MCV 98.1  MCH 33.0  MCHC 33.6  RDW 12.2  PLT 250   Cardiac EnzymesNo results for input(s): TROPONINI in the last 168 hours. No results for input(s): TROPIPOC in the last 168 hours.  BNP Recent Labs  Lab 02/03/19 1738  BNP 103.1*    DDimer  Recent Labs  Lab 02/03/19 1738  DDIMER 0.45    Radiology/Studies:  Dg Chest Portable 1 View  Result Date: 02/03/2019 CLINICAL DATA:  Pt here for central chest aching, especially when taking a deep breath, and shortness of breath that began this morning while sitting in his recliner. Denies sick contacts. No cough. Pt states fever of 99.0 today. Pt is SOB during exam. Hx of HTN. EXAM: PORTABLE CHEST - 1 VIEW COMPARISON:  10/19/2010 FINDINGS: Lungs  are clear. Heart size and mediastinal contours are within normal limits. No effusion. Cervical fixation hardware. IMPRESSION: No acute cardiopulmonary disease. Electronically Signed   By: Corlis Leak  Hassell M.D.   On: 02/03/2019 18:47    Assessment and Plan:   1. Atrial fibrillation with RVR  The CHA2DS2-VASc Score for the patient is 0 to 1. He is noted to be hypertensive on this admission but does not admit to a history of elevated blood pressures previously. His alcohol intake has been constant in the past few days (3-4 beers per day). He has no prior history of MI or stroke. His cardiovascular exam reveals an irregularly irregular rhythm and he has mild crackles on auscultation.  - Admit to telemetry - IV Cardizem infusion to control heart rate - Start PO metoprolol at 25 mg twice daily - Continue ASA 81 mg daily - Consider 2-D echo to evaluate LV function - Daily weights, strict I and Os, PRN Lasix for c/o shortness of breath - Serial enzymes and ECG to rule out ACS - Continue statin, check lipid panel - Will keep patient NPO after midnight (will evaluate for cardioversion if patient does not convert spontaneously to NSR overnight)    Severity of Illness: The appropriate patient status for this patient is INPATIENT. Inpatient status is judged to be reasonable and necessary in order to provide the required intensity of service to ensure the patient's safety. The patient's presenting symptoms, physical exam findings, and initial radiographic and laboratory data in the context of their chronic comorbidities is felt to place them at high risk for further clinical deterioration. Furthermore, it is not  anticipated that the patient will be medically stable for discharge from the hospital within 2 midnights of admission. The following factors support the patient status of inpatient.   " The patient's presenting symptoms include chest discomfort. " The worrisome physical exam findings include  tachycardia. " The initial radiographic and laboratory data are worrisome because of newly diagnosed AFIB on ECG. " The chronic co-morbidities include anxiety and hyperlipidemia.   * I certify that at the point of admission it is my clinical judgment that the patient will require inpatient hospital care spanning beyond 2 midnights from the point of admission due to high intensity of service, high risk for further deterioration and high frequency of surveillance required.*    For questions or updates, please contact [facility] Please consult [email or web address] for contact info under Cardiology/STEMI.    Signed, Lonie PeakMohammed W Delonte Musich, MD  02/03/2019 8:14 PM

## 2019-02-03 NOTE — ED Provider Notes (Signed)
[facility] EMERGENCY DEPART[facility] Provider Note   CSN: [id number] Arrival date & time: 02/03/19  1625    History   Chief Complaint Chief Complaint  Patient presents with   Chest Pain   Shortness of Breath    HPI Bevelyn BucklesJoel Kemler is a 63 y.o. male.     HPI Pt started having pain in his chest this am.  He then strarted having sghortness of breath.  The pain occurs with deep breathing.  The pain is dull.  The pain is in the center of the chest.  No radiation.  No nausea.  No fevers.  No leg swelling. Past Medical History:  Diagnosis Date   Anxiety    Elevated LFTs    History of colon polyps    HLD (hyperlipidemia)    HTN (hypertension)    Localized superficial swelling, mass, or lump     There are no active problems to display for this patient.   Past Surgical History:  Procedure Laterality Date   CERVICAL DISC SURGERY     LEG SURGERY     right leg and ankle   NECK SURGERY     cyst removed   TOTAL HIP ARTHROPLASTY     left        Home Medications    Prior to Admission medications   Medication Sig Start Date End Date Taking? Authorizing Provider  aspirin EC 81 MG tablet Take 81 mg by mouth daily.   Yes [provider]  Multiple Vitamin (MULTIVITAMIN) tablet Take 1 tablet by mouth daily.   Yes [provider]  OVER THE COUNTER MEDICATION Take 2 tablets by mouth daily. Equate brand   Yes [provider]  PARoxetine (PAXIL) 20 MG tablet Take 20 mg by mouth daily.   Yes [provider]  simvastatin (ZOCOR) 20 MG tablet Take 20 mg by mouth daily.   Yes [provider]    Family History Family History  Problem Relation Age of Onset   Hepatitis Mother    Clotting disorder Mother    Stroke Mother    Thyroid disease Mother    Colon cancer Father        ?   Rheumatic fever Brother    Heart disease Brother     Social History Social History   Tobacco Use   Smoking status: Current  Every Day Smoker   Smokeless tobacco: Never Used  Substance Use Topics   Alcohol use: Yes    Comment: 3-4 a day   Drug use: No     Allergies   Patient has no known allergies.   Review of Systems Review of Systems  All other systems reviewed and are negative.    Physical Exam Updated Vital Signs BP 139/85    Pulse (!) 122    Temp 99 F (37.2 C) (Oral)    Resp (!) 25    Ht 1.829 m (6')    SpO2 96%    BMI 26.31 kg/m   Physical Exam Vitals signs and nursing note reviewed.  Constitutional:      General: He is not in acute distress.    Appearance: He is well-developed.  HENT:     Head: Normocephalic and atraumatic.     Right Ear: External ear normal.     Left Ear: External ear normal.  Eyes:     General: No scleral icterus.       Right eye: No discharge.        Left  eye: No discharge.     Conjunctiva/sclera: Conjunctivae normal.  Neck:     Musculoskeletal: Neck supple.     Trachea: No tracheal deviation.  Cardiovascular:     Rate and Rhythm: Tachycardia present. Rhythm irregular.  Pulmonary:     Effort: Pulmonary effort is normal. No respiratory distress.     Breath sounds: Normal breath sounds. No stridor. No wheezing or rales.  Abdominal:     General: Bowel sounds are normal. There is no distension.     Palpations: Abdomen is soft.     Tenderness: There is no abdominal tenderness. There is no guarding or rebound.  Musculoskeletal:        General: No tenderness.  Skin:    General: Skin is warm and dry.     Findings: No rash.  Neurological:     Mental Status: He is alert.     Cranial Nerves: No cranial nerve deficit (no facial droop, extraocular movements intact, no slurred speech).     Sensory: No sensory deficit.     Motor: No abnormal muscle tone or seizure activity.     Coordination: Coordination normal.      ED Treatments / Results  Labs (all labs ordered are listed, but only abnormal results are displayed) Labs Reviewed  BASIC METABOLIC PANEL -  Abnormal; Notable for the following components:      Result Value   CO2 18 (*)    Glucose, Bld 125 (*)    BUN 7 (*)    All other components within normal limits  CBC - Abnormal; Notable for the following components:   WBC 18.0 (*)    Hemoglobin 17.6 (*)    HCT 52.4 (*)    All other components within normal limits  BRAIN NATRIURETIC PEPTIDE - Abnormal; Notable for the following components:   B Natriuretic Peptide 103.1 (*)    All other components within normal limits  SARS CORONAVIRUS 2 (HOSPITAL ORDER, [place] [facility])  D-DIMER, QUANTITATIVE (NOT AT [facility])  TROPONIN I (HIGH SENSITIVITY)  TROPONIN I (HIGH SENSITIVITY)    EKG EKG Interpretation  Date/Time:  Monday February 03 2019 17:17:02 EDT Ventricular Rate:  150 PR Interval:    QRS Duration: 95 QT Interval:  287 QTC Calculation: 451 R Axis:   37 Text Interpretation:  Atrial fibrillation with rapid ventricular response Low voltage, precordial leads Anteroseptal infarct, age indeterminate a fib is new since last tracing Confirmed by Dorie Rank [phone]) on 02/03/2019 5:41:27 PM   Radiology Dg Chest Portable 1 View  Result Date: 02/03/2019 CLINICAL DATA:  Pt here for central chest aching, especially when taking a deep breath, and shortness of breath that began this morning while sitting in his recliner. Denies sick contacts. No cough. Pt states fever of 99.0 today. Pt is SOB during exam. Hx of HTN. EXAM: PORTABLE CHEST - 1 VIEW COMPARISON:  10/19/2010 FINDINGS: Lungs are clear. Heart size and mediastinal contours are within normal limits. No effusion. Cervical fixation hardware. IMPRESSION: No acute cardiopulmonary disease. Electronically Signed   By: Lucrezia Europe M.D.   On: 02/03/2019 18:47    Procedures .Critical Care Performed by: Dorie Rank, MD Authorized by: Dorie Rank, MD   Critical care provider statement:    Critical care time (minutes):  35   Critical care was time spent personally by me on the  following activities:  Discussions with consultants, evaluation of patient's response to treatment, examination of patient, ordering and performing treatments and interventions, ordering and review  of laboratory studies, ordering and review of radiographic studies, pulse oximetry, re-evaluation of patient's condition, obtaining history from patient or surrogate and review of old charts   (including critical care time)  Medications Ordered in ED Medications  diltiazem (CARDIZEM) 1 mg/mL load via infusion 15 mg (15 mg Intravenous Bolus from Bag 02/03/19 1817)    And  diltiazem (CARDIZEM) 100 mg in dextrose 5% 100mL (1 mg/mL) infusion (5 mg/hr Intravenous New Bag/Given 02/03/19 1817)     Initial Impression / Assessment and Plan / ED Course  I have reviewed the triage vital signs and the nursing notes.  Pertinent labs & imaging results that were available during my care of the patient were reviewed by me and considered in my medical decision making (see chart for details).  Clinical Course as of Feb 03 1903  Mon Feb 03, 2019  1732 Cads Vasc 2=3   [JK]  1901 Patient's heart rate continues in the 130s to 140s while on a Cardizem drip we will need to continue to titrate   [JK]    Clinical Course User Index [JK] Linwood DibblesKnapp, Dariusz Brase, MD     Patient presented to the emergency room with complaints of chest pain.  Patient did not appreciate that his heart was racing.  Patient was noted to be rather tachycardic on exam.  His initial ED work-up is reassuring.  I doubt acute coronary syndrome.  D-dimer is -2.  I doubt PE.  No signs of pneumonia.  COVID test is pending but is not having any symptoms to suggest that.  I suspect his chest discomfort is related to his tachycardia.  I will consult with cardiology for further treatment and evaluation. Final Clinical Impressions(s) / ED Diagnoses   Final diagnoses:  Atrial fibrillation with rapid ventricular response (HCC)      Linwood DibblesKnapp, Yannick Steuber, MD 02/03/19 1904

## 2019-02-04 ENCOUNTER — Inpatient Hospital Stay (HOSPITAL_COMMUNITY): Payer: Medicare Other

## 2019-02-04 ENCOUNTER — Telehealth: Payer: Self-pay | Admitting: Cardiovascular Disease

## 2019-02-04 DIAGNOSIS — I4891 Unspecified atrial fibrillation: Secondary | ICD-10-CM

## 2019-02-04 DIAGNOSIS — E7800 Pure hypercholesterolemia, unspecified: Secondary | ICD-10-CM

## 2019-02-04 LAB — ECHOCARDIOGRAM COMPLETE
Height: 73 in
Weight: 3208 oz

## 2019-02-04 LAB — CBC
HCT: 48.4 % (ref 39.0–52.0)
Hemoglobin: 16.2 g/dL (ref 13.0–17.0)
MCH: 32.7 pg (ref 26.0–34.0)
MCHC: 33.5 g/dL (ref 30.0–36.0)
MCV: 97.8 fL (ref 80.0–100.0)
Platelets: 214 10*3/uL (ref 150–400)
RBC: 4.95 MIL/uL (ref 4.22–5.81)
RDW: 12.2 % (ref 11.5–15.5)
WBC: 16.8 10*3/uL — ABNORMAL HIGH (ref 4.0–10.5)
nRBC: 0 % (ref 0.0–0.2)

## 2019-02-04 LAB — HEMOGLOBIN A1C
Hgb A1c MFr Bld: 5.2 % (ref 4.8–5.6)
Mean Plasma Glucose: 102.54 mg/dL

## 2019-02-04 LAB — BASIC METABOLIC PANEL
Anion gap: 11 (ref 5–15)
BUN: 9 mg/dL (ref 8–23)
CO2: 20 mmol/L — ABNORMAL LOW (ref 22–32)
Calcium: 9.3 mg/dL (ref 8.9–10.3)
Chloride: 108 mmol/L (ref 98–111)
Creatinine, Ser: 0.85 mg/dL (ref 0.61–1.24)
GFR calc Af Amer: >60 mL/min (ref >60)
GFR calc non Af Amer: >60 mL/min (ref >60)
Glucose, Bld: 145 mg/dL — ABNORMAL HIGH (ref 70–99)
Potassium: 4 mmol/L (ref 3.5–5.1)
Sodium: 139 mmol/L (ref 135–145)

## 2019-02-04 LAB — LIPID PANEL
Cholesterol: 135 mg/dL (ref 0–200)
HDL: 78 mg/dL (ref >40)
LDL Cholesterol: 48 mg/dL (ref 0–99)
Total CHOL/HDL Ratio: 1.7 RATIO
Triglycerides: 47 mg/dL (ref <150)
VLDL: 9 mg/dL (ref 0–40)

## 2019-02-04 LAB — PROTIME-INR
INR: 1.1 (ref 0.8–1.2)
Prothrombin Time: 14.2 seconds (ref 11.4–15.2)

## 2019-02-04 LAB — HIV ANTIBODY (ROUTINE TESTING W REFLEX): HIV Screen 4th Generation wRfx: NONREACTIVE

## 2019-02-04 MED ORDER — DILTIAZEM HCL-DEXTROSE 100-5 MG/100ML-% IV SOLN (PREMIX)
5.0000 mg/h | INTRAVENOUS | Status: DC
  Administered 2019-02-04: 15 mg/h via INTRAVENOUS
  Administered 2019-02-04 (×2): 10 mg/h via INTRAVENOUS
  Filled 2019-02-04 (×4): qty 100

## 2019-02-04 MED ORDER — APIXABAN 5 MG PO TABS
5.0000 mg | ORAL_TABLET | Freq: Two times a day (BID) | ORAL | Status: DC
  Administered 2019-02-04 – 2019-02-05 (×4): 5 mg via ORAL
  Filled 2019-02-04 (×5): qty 1

## 2019-02-04 MED ORDER — ASPIRIN EC 81 MG PO TBEC: 81.0000 mg | DELAYED_RELEASE_TABLET | Freq: Every day | ORAL | Status: DC

## 2019-02-04 MED ORDER — NICOTINE 14 MG/24HR TD PT24: 14.0000 mg | MEDICATED_PATCH | Freq: Every day | TRANSDERMAL | Status: DC

## 2019-02-04 MED ORDER — ENOXAPARIN SODIUM 100 MG/ML ~~LOC~~ SOLN
1.0000 mg/kg | Freq: Two times a day (BID) | SUBCUTANEOUS | Status: DC
  Administered 2019-02-04: 95 mg via SUBCUTANEOUS
  Filled 2019-02-04 (×2): qty 0.95

## 2019-02-04 MED ORDER — SODIUM CHLORIDE 0.9% FLUSH
3.0000 mL | Freq: Two times a day (BID) | INTRAVENOUS | Status: DC
  Administered 2019-02-05: 3 mL via INTRAVENOUS

## 2019-02-04 MED ORDER — SODIUM CHLORIDE 0.9% FLUSH: 3.0000 mL | INTRAVENOUS | Status: DC | PRN

## 2019-02-04 MED ORDER — ONDANSETRON HCL 4 MG/2ML IJ SOLN: 4.0000 mg | Freq: Four times a day (QID) | INTRAMUSCULAR | Status: DC | PRN

## 2019-02-04 MED ORDER — SODIUM CHLORIDE 0.9 % IV SOLN
250.0000 mL | INTRAVENOUS | Status: DC
  Administered 2019-02-04: 250 mL via INTRAVENOUS

## 2019-02-04 MED ORDER — ACETAMINOPHEN 325 MG PO TABS: 650.0000 mg | ORAL_TABLET | ORAL | Status: DC | PRN

## 2019-02-04 MED ORDER — SODIUM CHLORIDE 0.9 % IV SOLN: INTRAVENOUS | Status: DC

## 2019-02-04 MED ORDER — METOPROLOL TARTRATE 50 MG PO TABS
50.0000 mg | ORAL_TABLET | Freq: Two times a day (BID) | ORAL | Status: DC
  Administered 2019-02-04 – 2019-02-05 (×3): 50 mg via ORAL
  Filled 2019-02-04 (×3): qty 1

## 2019-02-04 MED ORDER — ADULT MULTIVITAMIN W/MINERALS CH
1.0000 | ORAL_TABLET | Freq: Every day | ORAL | Status: DC
  Administered 2019-02-04 – 2019-02-05 (×2): 1 via ORAL
  Filled 2019-02-04 (×2): qty 1

## 2019-02-04 MED ORDER — NICOTINE 14 MG/24HR TD PT24
14.0000 mg | MEDICATED_PATCH | Freq: Every day | TRANSDERMAL | Status: DC
  Administered 2019-02-04 – 2019-02-05 (×2): 14 mg via TRANSDERMAL
  Filled 2019-02-04 (×2): qty 1

## 2019-02-04 MED ORDER — THIAMINE HCL 100 MG/ML IJ SOLN
100.0000 mg | Freq: Every day | INTRAMUSCULAR | Status: DC
  Filled 2019-02-04 (×2): qty 2

## 2019-02-04 MED ORDER — LORAZEPAM 1 MG PO TABS: 1.0000 mg | ORAL_TABLET | Freq: Four times a day (QID) | ORAL | Status: DC | PRN

## 2019-02-04 MED ORDER — FOLIC ACID 1 MG PO TABS
1.0000 mg | ORAL_TABLET | Freq: Every day | ORAL | Status: DC
  Administered 2019-02-04 – 2019-02-05 (×2): 1 mg via ORAL
  Filled 2019-02-04 (×2): qty 1

## 2019-02-04 MED ORDER — LORAZEPAM 2 MG/ML IJ SOLN: 1.0000 mg | Freq: Four times a day (QID) | INTRAMUSCULAR | Status: DC | PRN

## 2019-02-04 MED ORDER — VITAMIN B-1 100 MG PO TABS
100.0000 mg | ORAL_TABLET | Freq: Every day | ORAL | Status: DC
  Administered 2019-02-04 – 2019-02-05 (×2): 100 mg via ORAL
  Filled 2019-02-04 (×2): qty 1

## 2019-02-04 NOTE — ED Notes (Signed)
ED TO INPATIENT HANDOFF REPORT  ED Nurse Name and Phone #: Britta Mccreedy RN  S Name/Age/Gender Patrick Padilla 63 y.o. male Room/Bed: 037C/037C  Code Status   Code Status: Full Code  Home/SNF/Other Home Patient oriented to: self, place, time and situation Is this baseline? Yes   Triage Complete: Triage complete  Chief Complaint [facility]  Triage Note Pt here for central chest aching, especially when taking a deep breath, and shortness of breath that began this morning while sitting in his recliner. Denies sick contacts.    Allergies No Known Allergies  Level of Care/Admitting Diagnosis ED Disposition    ED Disposition Condition Comment   Admit  Hospital Area: [facility] [[id number]]  Level of Care: Telemetry Cardiac [[id number]]  Covid Evaluation: N/A  Diagnosis: Atrial fibrillation with rapid ventricular response [facility]) [[id number]]  Admitting Physician: Lonie Peak [[id number]]  Attending Physician: Chrystie Nose [phone]  Estimated length of stay: 3 - 4 days  Certification:: I certify this patient will need inpatient services for at least 2 midnights  PT Class (Do Not Modify): Inpatient [101]  PT Acc Code (Do Not Modify): Private [1]       B Medical/Surgery History Past Medical History:  Diagnosis Date  . Anxiety   . Elevated LFTs   . History of colon polyps   . HLD (hyperlipidemia)   . HTN (hypertension)   . Localized superficial swelling, mass, or lump    Past Surgical History:  Procedure Laterality Date  . CERVICAL DISC SURGERY    . LEG SURGERY     right leg and ankle  . NECK SURGERY     cyst removed  . TOTAL HIP ARTHROPLASTY     left     A IV Location/Drains/Wounds Patient Lines/Drains/Airways Status   Active Line/Drains/Airways    Name:   Placement date:   Placement time:   Site:   Days:   Peripheral IV 02/03/19 Left Antecubital   02/03/19    1729    Antecubital   1          Intake/Output Last 24 hours No intake or output data in  the 24 hours ending 02/04/19 1425  Labs/Imaging Results for orders placed or performed during the hospital encounter of 02/03/19 (from the past 48 hour(s))  Basic metabolic panel     Status: Abnormal   Collection Time: 02/03/19  4:40 PM  Result Value Ref Range   Sodium 140 135 - 145 mmol/L   Potassium 4.4 3.5 - 5.1 mmol/L   Chloride 108 98 - 111 mmol/L   CO2 18 (L) 22 - 32 mmol/L   Glucose, Bld 125 (H) 70 - 99 mg/dL   BUN 7 (L) 8 - 23 mg/dL   Creatinine, Ser 3.23 0.61 - 1.24 mg/dL   Calcium 9.2 8.9 - 55.7 mg/dL   GFR calc non Af Amer >60 >60 mL/min   GFR calc Af Amer >60 >60 mL/min   Anion gap 14 5 - 15    Comment: Performed at [facility], [address], [place], [place] [address]  CBC     Status: Abnormal   Collection Time: 02/03/19  4:40 PM  Result Value Ref Range   WBC 18.0 (H) 4.0 - 10.5 K/uL   RBC 5.34 4.22 - 5.81 MIL/uL   Hemoglobin 17.6 (H) 13.0 - 17.0 g/dL   HCT 54.2 (H) 70.6 - 23.7 %   MCV 98.1 80.0 - 100.0 fL   MCH 33.0 26.0 -  34.0 pg   MCHC 33.6 30.0 - 36.0 g/dL   RDW 16.112.2 09.611.5 - 04.515.5 %   Platelets 250 150 - 400 K/uL   nRBC 0.0 0.0 - 0.2 %    Comment: Performed at [facility], [address] [place], [place] [address]  Troponin I (High Sensitivity)     Status: None   Collection Time: 02/03/19  4:40 PM  Result Value Ref Range   Troponin I (High Sensitivity) 5 <18 ng/L    Comment: (NOTE) Elevated high sensitivity troponin I (hsTnI) values and significant  changes across serial measurements may suggest ACS but many other  chronic and acute conditions are known to elevate hsTnI results.  Refer to the "Links" section for chest pain algorithms and additional  guidance. Performed at [facility], [address] [place], [place] [address]   Brain natriuretic peptide     Status: Abnormal   Collection Time: 02/03/19  5:38 PM  Result Value Ref Range   B Natriuretic Peptide 103.1 (H) 0.0 - 100.0 pg/mL    Comment: Performed at [facility], [address] [place], [place] [address]  D-dimer, quantitative (not at [facility])     Status: None   Collection Time: 02/03/19  5:38 PM  Result Value Ref Range   D-Dimer, Quant 0.45 0.00 - 0.50 ug/mL-FEU    Comment: (NOTE) At the manufacturer cut-off of 0.50 ug/mL FEU, this assay has been documented to exclude PE with a sensitivity and negative predictive value of 97 to 99%.  At this time, this assay has not been approved by the FDA to exclude DVT/VTE. Results should be correlated with clinical presentation. Performed at [facility], [address] [place], [place] [address]   SARS Coronavirus 2 (CEPHEID - Performed in [facility] hospital lab), Hosp Order     Status: None   Collection Time: 02/03/19  6:18 PM   Specimen: Nasopharyngeal Swab  Result Value Ref Range   SARS Coronavirus 2 NEGATIVE NEGATIVE    Comment: (NOTE) If result is NEGATIVE SARS-CoV-2 target nucleic acids are NOT DETECTED. The SARS-CoV-2 RNA is generally detectable in upper and lower  respiratory specimens during the acute phase of infection. The lowest  concentration of SARS-CoV-2 viral copies this assay can detect is 250  copies / mL. A negative result does not preclude SARS-CoV-2 infection  and should not be used as the sole basis for treatment or other  patient management decisions.  A negative result may occur with  improper specimen collection / handling, submission of specimen other  than nasopharyngeal swab, presence of viral mutation(s) within the  areas targeted by this assay, and inadequate number of viral copies  (<250 copies / mL). A negative result must be combined with clinical  observations, patient history, and epidemiological information. If result is POSITIVE SARS-CoV-2 target nucleic acids are DETECTED. The SARS-CoV-2 RNA is generally detectable in upper and lower  respiratory specimens dur ing the acute phase of infection.  Positive  results are indicative of active  infection with SARS-CoV-2.  Clinical  correlation with patient history and other diagnostic information is  necessary to determine patient infection status.  Positive results do  not rule out bacterial infection or co-infection with other viruses. If result is PRESUMPTIVE POSTIVE SARS-CoV-2 nucleic acids MAY BE PRESENT.   A presumptive positive result was obtained on the submitted specimen  and confirmed on repeat testing.  While 2019 novel coronavirus  (SARS-CoV-2) nucleic acids  may be present in the submitted sample  additional confirmatory testing may be necessary for epidemiological  and / or clinical management purposes  to differentiate between  SARS-CoV-2 and other Sarbecovirus currently known to infect humans.  If clinically indicated additional testing with an alternate test  methodology [phone]([id number]) is advised. The SARS-CoV-2 RNA is generally  detectable in upper and lower respiratory sp ecimens during the acute  phase of infection. The expected result is Negative. Fact Sheet for Patients:  BoilerBrush.com.cyhttps://www.fda.gov/media/136312/download Fact Sheet for Healthcare Providers: https://pope.com/https://www.fda.gov/media/136313/download This test is not yet approved or cleared by the [place] FDA and has been authorized for detection and/or diagnosis of SARS-CoV-2 by FDA under an Emergency Use Authorization (EUA).  This EUA will remain in effect (meaning this test can be used) for the duration of the COVID-19 declaration under Section 564(b)(1) of the Act, 21 U.S.C. section 360bbb-3(b)(1), unless the authorization is terminated or revoked sooner. Performed at [facility], [address] [place], [place] [address]   Troponin I (High Sensitivity)     Status: None   Collection Time: 02/03/19  6:33 PM  Result Value Ref Range   Troponin I (High Sensitivity) 3 <18 ng/L    Comment: (NOTE) Elevated high sensitivity troponin I (hsTnI) values and significant  changes across serial measurements may  suggest ACS but many other  chronic and acute conditions are known to elevate hsTnI results.  Refer to the "Links" section for chest pain algorithms and additional  guidance. Performed at [facility], [address] [place], [place] [address]   Basic metabolic panel     Status: Abnormal   Collection Time: 02/04/19  3:16 AM  Result Value Ref Range   Sodium 139 135 - 145 mmol/L   Potassium 4.0 3.5 - 5.1 mmol/L   Chloride 108 98 - 111 mmol/L   CO2 20 (L) 22 - 32 mmol/L   Glucose, Bld 145 (H) 70 - 99 mg/dL   BUN 9 8 - 23 mg/dL   Creatinine, Ser 4.780.85 0.61 - 1.24 mg/dL   Calcium 9.3 8.9 - 29.510.3 mg/dL   GFR calc non Af Amer >60 >60 mL/min   GFR calc Af Amer >60 >60 mL/min   Anion gap 11 5 - 15    Comment: Performed at [facility], [address] [place], [place] [address]  Lipid panel     Status: None   Collection Time: 02/04/19  3:16 AM  Result Value Ref Range   Cholesterol 135 0 - 200 mg/dL   Triglycerides 47 <865<150 mg/dL   HDL 78 >78>40 mg/dL   Total CHOL/HDL Ratio 1.7 RATIO   VLDL 9 0 - 40 mg/dL   LDL Cholesterol 48 0 - 99 mg/dL    Comment:        Total Cholesterol/HDL:CHD Risk Coronary Heart Disease Risk Table                     Men   Women  1/2 Average Risk   3.4   3.3  Average Risk       5.0   4.4  2 X Average Risk   9.6   7.1  3 X Average Risk  23.4   11.0        Use the calculated Patient Ratio above and the CHD Risk Table to determine the patient's CHD Risk.        ATP III CLASSIFICATION (LDL):  <100  mg/dL   Optimal  [id number]  mg/dL   Near or Above                    Optimal  130-159  mg/dL   Borderline  [id number]  mg/dL   High  >295>190     mg/dL   Very High Performed at [facility], [address] [place], [place] [address]   CBC     Status: Abnormal   Collection Time: 02/04/19  3:16 AM  Result Value Ref Range   WBC 16.8 (H) 4.0 - 10.5 K/uL   RBC 4.95 4.22 - 5.81 MIL/uL   Hemoglobin 16.2 13.0 - 17.0 g/dL   HCT 24.448.4 01.039.0 - 27.252.0 %   MCV  97.8 80.0 - 100.0 fL   MCH 32.7 26.0 - 34.0 pg   MCHC 33.5 30.0 - 36.0 g/dL   RDW 53.612.2 64.411.5 - 03.415.5 %   Platelets 214 150 - 400 K/uL   nRBC 0.0 0.0 - 0.2 %    Comment: Performed at [facility], [address] [place], [place] [address]  Protime-INR     Status: None   Collection Time: 02/04/19  3:16 AM  Result Value Ref Range   Prothrombin Time 14.2 11.4 - 15.2 seconds   INR 1.1 0.8 - 1.2    Comment: (NOTE) INR goal varies based on device and disease states. Performed at [facility], [address] [place], [place] [address]   HIV antibody (Routine Testing)     Status: None   Collection Time: 02/04/19  3:16 AM  Result Value Ref Range   HIV Screen 4th Generation wRfx Non Reactive Non Reactive    Comment: (NOTE) Performed At: [facility] [address] [place], [place] [id number] Jolene SchimkeNagendra Sanjai MD OA:[phone]   Hemoglobin A1c     Status: None   Collection Time: 02/04/19  3:16 AM  Result Value Ref Range   Hgb A1c MFr Bld 5.2 4.8 - 5.6 %    Comment: (NOTE) Pre diabetes:          5.7%-6.4% Diabetes:              >6.4% Glycemic control for   <7.0% adults with diabetes    Mean Plasma Glucose 102.54 mg/dL    Comment: Performed at [facility], [address] [place], [place] [address]   Dg Chest Portable 1 View  Result Date: 02/03/2019 CLINICAL DATA:  Pt here for central chest aching, especially when taking a deep breath, and shortness of breath that began this morning while sitting in his recliner. Denies sick contacts. No cough. Pt states fever of 99.0 today. Pt is SOB during exam. Hx of HTN. EXAM: PORTABLE CHEST - 1 VIEW COMPARISON:  10/19/2010 FINDINGS: Lungs are clear. Heart size and mediastinal contours are within normal limits. No effusion. Cervical fixation hardware. IMPRESSION: No acute cardiopulmonary disease. Electronically Signed   By: Corlis Leak  Hassell M.D.   On: 02/03/2019 18:47    Pending Labs Unresulted Labs (From admission, onward)     Start     Ordered   02/11/19 0000  Creatinine, serum  (enoxaparin (LOVENOX) full dose)  Weekly,   R    Comments: while on enoxaparin therapy.    02/04/19 0021   02/04/19 0817  TSH  Add-on,   AD     02/04/19 0816          Vitals/Pain Today's Vitals   02/04/19 1130 02/04/19  1145 02/04/19 1200 02/04/19 1314  BP: 123/71 108/71 125/77   Pulse: 65 (!) 37 71   Resp: (!) 21 (!) 22 19   Temp:      TempSrc:      SpO2: 95% 95% 95%   Weight:      Height:      PainSc:    0-No pain    Isolation Precautions No active isolations  Medications Medications  PARoxetine (PAXIL) tablet 20 mg (has no administration in time range)  acetaminophen (TYLENOL) tablet 650 mg (has no administration in time range)  ondansetron (ZOFRAN) injection 4 mg (has no administration in time range)  diltiazem (CARDIZEM) 100 mg in dextrose 5% 117mL (1 mg/mL) infusion (10 mg/hr Intravenous New Bag/Given 02/04/19 0642)  apixaban (ELIQUIS) tablet 5 mg (has no administration in time range)  sodium chloride flush (NS) 0.9 % injection 3 mL (3 mLs Intravenous Not Given 02/04/19 1121)  sodium chloride flush (NS) 0.9 % injection 3 mL (has no administration in time range)  0.9 %  sodium chloride infusion (250 mLs Intravenous New Bag/Given 02/04/19 1122)  LORazepam (ATIVAN) tablet 1 mg (has no administration in time range)    Or  LORazepam (ATIVAN) injection 1 mg (has no administration in time range)  thiamine (VITAMIN B-1) tablet 100 mg (100 mg Oral Given 02/04/19 1309)    Or  thiamine (B-1) injection 100 mg ( Intravenous See Alternative 9/60/45 4098)  folic acid (FOLVITE) tablet 1 mg (has no administration in time range)  multivitamin with minerals tablet 1 tablet (has no administration in time range)  metoprolol tartrate (LOPRESSOR) tablet 50 mg (has no administration in time range)  nicotine (NICODERM CQ - dosed in mg/24 hours) patch 14 mg (14 mg Transdermal Patch Applied 02/04/19 1308)  diltiazem (CARDIZEM) 1 mg/mL load  via infusion 15 mg (15 mg Intravenous Bolus from Bag 02/03/19 1817)    Mobility walks Low fall risk   Focused Assessments Cardiac Assessment Handoff:  Cardiac Rhythm: Atrial fibrillation No results found for: CKTOTAL, CKMB, CKMBINDEX, TROPONINI Lab Results  Component Value Date   DDIMER 0.45 02/03/2019   Does the Patient currently have chest pain? No     R Recommendations: See Admitting Provider Note  Report given to:   Additional Notes:

## 2019-02-04 NOTE — Progress Notes (Signed)
  Echocardiogram 2D Echocardiogram has been performed.  Patrick Padilla 02/04/2019, 4:16 PM

## 2019-02-04 NOTE — Progress Notes (Addendum)
Progress Note  Patient Name: Patrick Padilla Date of Encounter: 02/04/2019  Primary Cardiologist: Sanda Klein, MD   Subjective   Reports feeling much better this morning. Still with lingering chest pressure, particularly with deep breathing. He denies palpitations at this time and did not appreciate any palpitations or racing heart beats yesterday when he was found to have a HR of 150.   Inpatient Medications    Scheduled Meds: . apixaban  5 mg Oral BID  . metoprolol tartrate  25 mg Oral BID  . PARoxetine  20 mg Oral Daily   Continuous Infusions: . diltiazem (CARDIZEM) infusion 10 mg/hr (02/04/19 0642)   PRN Meds: acetaminophen, ondansetron (ZOFRAN) IV   Vital Signs    Vitals:   02/04/19 0845 02/04/19 0900 02/04/19 0945 02/04/19 1015  BP: 124/76 119/79 130/78 112/64  Pulse: 73 83 80 67  Resp: (!) 22 (!) 22 18 18   Temp:      TempSrc:      SpO2: 92% 94% 96% 96%  Weight:      Height:       No intake or output data in the 24 hours ending 02/04/19 1042 Filed Weights   02/04/19 0130  Weight: 94.8 kg    Telemetry    Atrial fibrillation with rates in the 70s-80s this morning - Personally Reviewed  Physical Exam   GEN: Sitting upright in bed in no acute distress.   Neck: No JVD, no carotid bruits Cardiac: IRIR, no murmurs, rubs, or gallops.  Respiratory: Clear to auscultation bilaterally, no wheezes/ rales/ rhonchi GI: NABS, Soft, nontender, non-distended  MS: No edema; No deformity. Neuro:  Nonfocal, moving all extremities spontaneously. Hard of hearing Psych: Normal affect   Labs    Chemistry Recent Labs  Lab 02/03/19 1640 02/04/19 0316  NA 140 139  K 4.4 4.0  CL 108 108  CO2 18* 20*  GLUCOSE 125* 145*  BUN 7* 9  CREATININE 0.95 0.85  CALCIUM 9.2 9.3  GFRNONAA >60 >60  GFRAA >60 >60  ANIONGAP 14 11     Hematology Recent Labs  Lab 02/03/19 1640 02/04/19 0316  WBC 18.0* 16.8*  RBC 5.34 4.95  HGB 17.6* 16.2  HCT 52.4* 48.4  MCV 98.1 97.8   MCH 33.0 32.7  MCHC 33.6 33.5  RDW 12.2 12.2  PLT 250 214    Cardiac EnzymesNo results for input(s): TROPONINI in the last 168 hours. No results for input(s): TROPIPOC in the last 168 hours.   BNP Recent Labs  Lab 02/03/19 1738  BNP 103.1*     DDimer  Recent Labs  Lab 02/03/19 1738  DDIMER 0.45     Radiology    Dg Chest Portable 1 View  Result Date: 02/03/2019 CLINICAL DATA:  Pt here for central chest aching, especially when taking a deep breath, and shortness of breath that began this morning while sitting in his recliner. Denies sick contacts. No cough. Pt states fever of 99.0 today. Pt is SOB during exam. Hx of HTN. EXAM: PORTABLE CHEST - 1 VIEW COMPARISON:  10/19/2010 FINDINGS: Lungs are clear. Heart size and mediastinal contours are within normal limits. No effusion. Cervical fixation hardware. IMPRESSION: No acute cardiopulmonary disease. Electronically Signed   By: Lucrezia Europe M.D.   On: 02/03/2019 18:47    Cardiac Studies   Echo pending.  Patient Profile     63 y.o. male with PMH of HTN, HLD, anxiety, colon polyps, ETOH abuse, and tobacco abuse, who presented with chest pain and  was found to have new onset atrial fibrillation.  Assessment & Plan    1. New onset atrial fibrillation: patient presented with chest pain starting yesterday morning. EKG showed atrial fibrillation with RVR with rate to 150bpm and low voltage in precordial leads. He was started on a diltiazem gtt and po metoprolol for rate control with improvement in HR to 70s-80s, He was started on therapeutic lovenox for stroke ppx given CHA2DS2-VASc Score of at least 1 (HTN). He reports drinking 3-4 beers per day. No prior ischemic evaluation though he does have risk factors for CAD (HTN, HLD, tobacco/ETOH abuse). Trops negative and EKG without ischemic changes.    - Will check TSH  - Will check HgbA1C for risk stratification - Will check an echocardiogram to assess LV function and valves. If normal, could  consider outpatient ischemic evaluation - Will start apixaban 5mg  BID  - Will stop aspirin at this time given no prior CAD history and need for anticoagulation.  - Plan for TEE/DCCV tomorrow. The risks and benefits of transesophageal echocardiogram have been explained including risks of esophageal damage, perforation (1:10,000 risk), bleeding, pharyngeal hematoma as well as other potential complications associated with conscious sedation including aspiration, arrhythmia, respiratory failure and death. Alternatives to treatment were discussed, questions were answered. Patient is willing to proceed.   2. Chest pain: likely occurred in the setting of new onset atrial fibrillation with RVR - rates up to 150s. Still with lingering pressure this morning, with some pleuritic components. Trops and Ddimer negative. EKG non-ischemic. No prior ischemic evaluation though he does have risk factors for CAD (HTN, HLD, tobacco/ETOH abuse).  - Will check an echocardiogram to assess LV function and wall motion - if normal, could consider outpatient ischemic evaluation  3. HTN: BP elevated on presentation to 188/87. Not on medications prior to admission. BP improved with po metoprolol and diltiazem gtt for management of #1.  - Continue metoprolol 25mg  BID - Further medication adjustments pending echo.   4. HLD: LDL 48 this admission. Well controlled on simvastatin 20mg  daily - Continue simvastatin  5. ETOH abuse: patient reports drinking 3-4 beers per day. No history of withdrawals - Continue to encourage cutting back/cessation  6. Tobacco abuse: smoke 2 ppd.  - Continue to encourage smoking cessation  7. Anxiety:  - Continue paxil  For questions or updates, please contact [facility] Please consult [email or web address] for contact info under Cardiology/STEMI.      Signed, Beatriz StallionKrista M. Kroeger, PA-C  02/04/2019, 10:42 AM   [phone]  I have seen and examined the patient along with Beatriz StallionKrista M. Kroeger, PA-C.   I have reviewed the chart, notes and new data.  I agree with PA/NP's note.  Key new complaints: substantially better w rate control; getting a little anxious without cigarettes. Drinks alcohol heavily. Key examination changes: irregular rhythm, otherwise normal CV exam Key new findings / data: AFib w RVR and without ST-T changes even at very fast rates.  PLAN: Plan TEE guided DCCV tomorrow, after 3 doses apixaban. Reinforced need for uninterrupted anticoagulation for at least 30 days after the cardioversion.  If LVEF is normal and no major structural abnormalities, may dc anticoagulation after 1 month (CHADSVasc 1 - HTN). Discussed the strong relationship between alcohol consumption and AFib burden. He needs to stop or cut back to no more than 7 drinks a week. Increase beta blocker and wean off diltiazem.  Thurmon FairMihai D'Arcy Abraha, MD, [facility] [phone] 02/04/2019, 11:24 AM

## 2019-02-04 NOTE — Discharge Instructions (Addendum)
NO tobacco, use the patch LIMIT alcohol to 1 ounce daily>>VERY IMPORTANT  Information on my medicine - ELIQUIS (apixaban)  This medication education was reviewed with me or my healthcare representative as part of my discharge preparation.  Why was Eliquis prescribed for you? Eliquis was prescribed for you to reduce the risk of a blood clot forming that can cause a stroke if you have a medical condition called atrial fibrillation (a type of irregular heartbeat).  What do You need to know about Eliquis ? Take your Eliquis TWICE DAILY - one tablet in the morning and one tablet in the evening with or without food. If you have difficulty swallowing the tablet whole please discuss with your pharmacist how to take the medication safely.  Take Eliquis exactly as prescribed by your doctor and DO NOT stop taking Eliquis without talking to the doctor who prescribed the medication.  Stopping may increase your risk of developing a stroke.  Refill your prescription before you run out.  After discharge, you should have regular check-up appointments with your healthcare provider that is prescribing your Eliquis.  In the future your dose may need to be changed if your kidney function or weight changes by a significant amount or as you get older.  What do you do if you miss a dose? If you miss a dose, take it as soon as you remember on the same day and resume taking twice daily.  Do not take more than one dose of ELIQUIS at the same time to make up a missed dose.  Important Safety Information A possible side effect of Eliquis is bleeding. You should call your healthcare provider right away if you experience any of the following: ? Bleeding from an injury or your nose that does not stop. ? Unusual colored urine (red or dark brown) or unusual colored stools (red or black). ? Unusual bruising for unknown reasons. ? A serious fall or if you hit your head (even if there is no bleeding).  Some medicines  may interact with Eliquis and might increase your risk of bleeding or clotting while on Eliquis. To help avoid this, consult your healthcare provider or pharmacist prior to using any new prescription or non-prescription medications, including herbals, vitamins, non-steroidal anti-inflammatory drugs (NSAIDs) and supplements.  This website has more information on Eliquis (apixaban): http://www.eliquis.com/eliquis/home

## 2019-02-04 NOTE — Telephone Encounter (Signed)
New Message   Per PA Daleen Snook [employer] scheduled [facility] visit with PA Almyra Deforest on 02/21/2019 at 8:15am.

## 2019-02-04 NOTE — Telephone Encounter (Signed)
Phoned and left voicemail requesting callback to [phone]

## 2019-02-04 NOTE — ED Notes (Signed)
Cardiology PA here for eval.

## 2019-02-05 ENCOUNTER — Encounter (HOSPITAL_COMMUNITY)
Admission: EM | Disposition: A | Discharge status: Home / Self Care | Payer: Self-pay | Source: Home / Self Care | Attending: Internal Medicine

## 2019-02-05 ENCOUNTER — Inpatient Hospital Stay (HOSPITAL_COMMUNITY): Payer: Medicare Other | Admitting: Certified Registered"

## 2019-02-05 ENCOUNTER — Inpatient Hospital Stay (HOSPITAL_COMMUNITY): Payer: Medicare Other

## 2019-02-05 ENCOUNTER — Other Ambulatory Visit: Payer: Self-pay

## 2019-02-05 ENCOUNTER — Encounter (HOSPITAL_COMMUNITY): Payer: Self-pay

## 2019-02-05 DIAGNOSIS — I4819 Other persistent atrial fibrillation: Secondary | ICD-10-CM

## 2019-02-05 DIAGNOSIS — I4811 Longstanding persistent atrial fibrillation: Secondary | ICD-10-CM

## 2019-02-05 DIAGNOSIS — I4891 Unspecified atrial fibrillation: Secondary | ICD-10-CM

## 2019-02-05 HISTORY — PX: TEE WITHOUT CARDIOVERSION: SHX5443

## 2019-02-05 HISTORY — PX: CARDIOVERSION: SHX1299

## 2019-02-05 LAB — TSH: TSH: 4.058 u[IU]/mL (ref 0.350–4.500)

## 2019-02-05 SURGERY — ECHOCARDIOGRAM, TRANSESOPHAGEAL
Anesthesia: Monitor Anesthesia Care

## 2019-02-05 MED ORDER — NICOTINE 14 MG/24HR TD PT24: 14.0000 mg | MEDICATED_PATCH | Freq: Every day | TRANSDERMAL | 0 refills | Status: DC

## 2019-02-05 MED ORDER — LACTATED RINGERS IV SOLN
INTRAVENOUS | Status: DC | PRN
  Administered 2019-02-05: 13:00:00 via INTRAVENOUS

## 2019-02-05 MED ORDER — PROPOFOL 500 MG/50ML IV EMUL
INTRAVENOUS | Status: DC | PRN
  Administered 2019-02-05: 150 ug/kg/min via INTRAVENOUS

## 2019-02-05 MED ORDER — LIDOCAINE 2% (20 MG/ML) 5 ML SYRINGE
INTRAMUSCULAR | Status: DC | PRN
  Administered 2019-02-05: 100 mg via INTRAVENOUS

## 2019-02-05 MED ORDER — APIXABAN 5 MG PO TABS: 5.0000 mg | ORAL_TABLET | Freq: Two times a day (BID) | ORAL | 0 refills | Status: DC

## 2019-02-05 MED ORDER — BUTAMBEN-TETRACAINE-BENZOCAINE 2-2-14 % EX AERO
INHALATION_SPRAY | CUTANEOUS | Status: DC | PRN
  Administered 2019-02-05: 13:00:00 2 via TOPICAL

## 2019-02-05 MED ORDER — PROPOFOL 10 MG/ML IV BOLUS
INTRAVENOUS | Status: DC | PRN
  Administered 2019-02-05: 20 mg via INTRAVENOUS
  Administered 2019-02-05: 30 mg via INTRAVENOUS
  Administered 2019-02-05: 20 mg via INTRAVENOUS
  Administered 2019-02-05: 70 mg via INTRAVENOUS

## 2019-02-05 MED ORDER — METOPROLOL TARTRATE 50 MG PO TABS: 50.0000 mg | ORAL_TABLET | Freq: Two times a day (BID) | ORAL | 11 refills | Status: DC

## 2019-02-05 MED ORDER — PHENYLEPHRINE HCL (PRESSORS) 10 MG/ML IV SOLN
INTRAVENOUS | Status: DC | PRN
  Administered 2019-02-05: 120 ug via INTRAVENOUS

## 2019-02-05 NOTE — Progress Notes (Signed)
 Progress Note  Patient Name: Patrick Padilla Date of Encounter: 02/05/2019  Primary Cardiologist: Zyrion Coey, MD   Subjective   Had a good night.  Denies dyspnea or other cardiovascular complaints.  Good ventricular rate control.  Transient bradycardia overnight, diltiazem stopped.  Inpatient Medications    Scheduled Meds: . apixaban  5 mg Oral BID  . folic acid  1 mg Oral Daily  . metoprolol tartrate  50 mg Oral BID  . multivitamin with minerals  1 tablet Oral Daily  . nicotine  14 mg Transdermal Daily  . PARoxetine  20 mg Oral Daily  . sodium chloride flush  3 mL Intravenous Q12H  . thiamine  100 mg Oral Daily   Or  . thiamine  100 mg Intravenous Daily   Continuous Infusions: . sodium chloride    . sodium chloride 10 mL/hr at 02/04/19 1125  . diltiazem (CARDIZEM) infusion Stopped (02/05/19 0127)   PRN Meds: acetaminophen, LORazepam **OR** LORazepam, ondansetron (ZOFRAN) IV, sodium chloride flush   Vital Signs    Vitals:   02/05/19 0109 02/05/19 0400 02/05/19 0416 02/05/19 0744  BP: 110/61  107/75 (!) 114/57  Pulse: 62  65 80  Resp: 18 17 19 19  Temp: 98.6 F (37 C)  98.2 F (36.8 C) 98.2 F (36.8 C)  TempSrc: Oral  Oral Oral  SpO2: 93%  94% 95%  Weight:   91 kg   Height:        Intake/Output Summary (Last 24 hours) at 02/05/2019 0959 Last data filed at 02/05/2019 0700 Gross per 24 hour  Intake 904.11 ml  Output -  Net 904.11 ml   Last 3 Weights 02/05/2019 02/04/2019 02/04/2019  Weight (lbs) 200 lb 9.9 oz 200 lb 8 oz 209 lb  Weight (kg) 91 kg 90.946 kg 94.802 kg      Telemetry    Atrial fibrillation with controlled ventricular response- Personally Reviewed  ECG    No new tracing- Personally Reviewed  Physical Exam  Looks very comfortable lying fully supine in bed GEN: No acute distress.   Neck: No JVD Cardiac:  Irregular, no murmurs, rubs, or gallops.  Respiratory: Clear to auscultation bilaterally. GI: Soft, nontender, non-distended  MS: No  edema; No deformity. Neuro:  Nonfocal  Psych: Normal affect   Labs    High Sensitivity Troponin:   Recent Labs  Lab 02/03/19 1640 02/03/19 1833  TROPONINIHS 5 3      Cardiac EnzymesNo results for input(s): TROPONINI in the last 168 hours. No results for input(s): TROPIPOC in the last 168 hours.   Chemistry Recent Labs  Lab 02/03/19 1640 02/04/19 0316  NA 140 139  K 4.4 4.0  CL 108 108  CO2 18* 20*  GLUCOSE 125* 145*  BUN 7* 9  CREATININE 0.95 0.85  CALCIUM 9.2 9.3  GFRNONAA >60 >60  GFRAA >60 >60  ANIONGAP 14 11     Hematology Recent Labs  Lab 02/03/19 1640 02/04/19 0316  WBC 18.0* 16.8*  RBC 5.34 4.95  HGB 17.6* 16.2  HCT 52.4* 48.4  MCV 98.1 97.8  MCH 33.0 32.7  MCHC 33.6 33.5  RDW 12.2 12.2  PLT 250 214    BNP Recent Labs  Lab 02/03/19 1738  BNP 103.1*     DDimer  Recent Labs  Lab 02/03/19 1738  DDIMER 0.45     Radiology    Dg Chest Portable 1 View  Result Date: 02/03/2019 CLINICAL DATA:  Pt here for central chest aching, especially   when taking a deep breath, and shortness of breath that began this morning while sitting in his recliner. Denies sick contacts. No cough. Pt states fever of 99.0 today. Pt is SOB during exam. Hx of HTN. EXAM: PORTABLE CHEST - 1 VIEW COMPARISON:  10/19/2010 FINDINGS: Lungs are clear. Heart size and mediastinal contours are within normal limits. No effusion. Cervical fixation hardware. IMPRESSION: No acute cardiopulmonary disease. Electronically Signed   By: Lucrezia Europe M.D.   On: 02/03/2019 18:47    Cardiac Studies   Echo 02/04/2019   1. The left ventricle has normal systolic function, with an ejection fraction of 55-60%. The cavity size was normal. There is moderately increased left ventricular wall thickness. Left ventricular diastolic function could not be evaluated secondary to  atrial fibrillation. No evidence of left ventricular regional wall motion abnormalities.  2. The mitral valve is abnormal. Mild  thickening of the mitral valve leaflet.  3. The tricuspid valve is grossly normal.  4. The aortic valve is tricuspid. No stenosis of the aortic valve.  5. The aorta is normal in size and structure.  6. The inferior vena cava was normal in size with <50% respiratory variability.   Patient Profile     63 y.o. male with recently diagnosed persistent atrial fibrillation with rapid ventricular response, history of heavy alcohol use, smoker, hyperlipidemia,  without evidence of structural heart disease or major risk factors (borderline hypertension diagnosis).  Assessment & Plan    1. AFib: Good rate control.  On oral anticoagulants. CHA2DS2-VASc Score 0-1 (borderline HTN).  Will probably discontinue apixaban after 30 days from cardioversion.  Normal size left and right atria.  For TEE-cardioversion today. This procedure has been fully reviewed with the patient and written informed consent has been obtained. 2. HTN: Normal blood pressure while receiving beta-blockers. 3. HLP: On simvastatin; excellent LDL cholesterol level. 4. Alcohol use: Discussed the known relationship between alcohol intake and the overall burden of atrial fibrillation.  Strongly encouraged reduction of alcohol use.   For questions or updates, please contact [place] Please consult [email or web address] for contact info under        Signed, Sanda Klein, MD  02/05/2019, 9:59 AM

## 2019-02-05 NOTE — TOC Benefit Eligibility Note (Signed)
Transition of Care [facility]) Benefit Eligibility Note    Patient Details  Name: Patrick Padilla MRN: [id number] Date of Birth: September 08, 1955                           Additional Notes: NO PHARMACY BENEFIT  ON Kary Kos    Memory Argue Phone Number: 02/05/2019, 12:56 PM

## 2019-02-05 NOTE — CV Procedure (Signed)
TEE/DCC: 3D Rendering of atrial septum Anesthesia : Propofol  EF 55% in afib No LAA thrombus Mild LAE Redundant and mobile atrial septum no obvious PFO Normal RV Trivial MR Normal AV  DCC x 2 150 J and 200 J biphasic  Converted from afib rate 90 to NSR rate 72 bpm On Eliquis  No immediate neurologic sequelae  Jenkins Rouge

## 2019-02-05 NOTE — Anesthesia Postprocedure Evaluation (Signed)
Anesthesia Post Note  Patient: Patrick Padilla  Procedure(s) Performed: TRANSESOPHAGEAL ECHOCARDIOGRAM (TEE) (N/A ) CARDIOVERSION (N/A )     Patient location during evaluation: Endoscopy Anesthesia Type: MAC Level of consciousness: awake and alert, oriented and patient cooperative Pain management: pain level controlled Vital Signs Assessment: post-procedure vital signs reviewed and stable Respiratory status: spontaneous breathing, nonlabored ventilation, respiratory function stable and patient connected to nasal cannula oxygen Cardiovascular status: blood pressure returned to baseline and stable Postop Assessment: no apparent nausea or vomiting Anesthetic complications: no    Last Vitals:  Vitals:   02/05/19 1310 02/05/19 1320  BP: (!) 85/44 (!) 105/56  Pulse: 73 75  Resp: 17 16  Temp:    SpO2: 96% 96%    Last Pain:  Vitals:   02/05/19 1320  TempSrc:   PainSc: 0-No pain                 [name],E. Letica Giaimo

## 2019-02-05 NOTE — Progress Notes (Signed)
Patient's HR dropped in the 40's and had 2 sec pause. Pt is asyptomatic. Cardizem drip discontinued per protocol. MD on call notified. No new orders given. Will continue to monitor pt.

## 2019-02-05 NOTE — Progress Notes (Signed)
Pt verbalized understanding of all discharge instructions and medications

## 2019-02-05 NOTE — Interval H&P Note (Signed)
History and Physical Interval Note:  02/05/2019 12:11 PM  Patrick Padilla  has presented today for surgery, with the diagnosis of AFIB.  The various methods of treatment have been discussed with the patient and family. After consideration of risks, benefits and other options for treatment, the patient has consented to  Procedure(s): TRANSESOPHAGEAL ECHOCARDIOGRAM (TEE) (N/A) CARDIOVERSION (N/A) as a surgical intervention.  The patient's history has been reviewed, patient examined, no change in status, stable for surgery.  I have reviewed the patient's chart and labs.  Questions were answered to the patient's satisfaction.     Jenkins Rouge

## 2019-02-05 NOTE — Discharge Summary (Signed)
Discharge Summary    Patient ID: Patrick BucklesJoel Padilla,  MRN: [id number], DOB/AGE: 03/19/1956 63 y.o.  Admit date: 02/03/2019 Discharge date: 02/05/2019  Primary Care Provider: Lavell Islamloward, Davis L Primary Cardiologist: Thurmon FairMihai Croitoru, MD   Discharge Diagnoses    Principal Problem:   Atrial fibrillation with rapid ventricular response [facility](HCC) Active Problems:   Hyperlipidemia   Anxiety   Other persistent atrial fibrillation   Allergies No Known Allergies  Diagnostic Studies/Procedures    ECHO: 02/04/2019  1. The left ventricle has normal systolic function, with an ejection fraction of 55-60%. The cavity size was normal. There is moderately increased left ventricular wall thickness. Left ventricular diastolic function could not be evaluated secondary to  atrial fibrillation. No evidence of left ventricular regional wall motion abnormalities.  2. The mitral valve is abnormal. Mild thickening of the mitral valve leaflet.  3. The tricuspid valve is grossly normal.  4. The aortic valve is tricuspid. No stenosis of the aortic valve.  5. The aorta is normal in size and structure.  6. The inferior vena cava was normal in size with <50% respiratory variability.  TEE: 02/05/2019  1. The left ventricle has normal systolic function, with an ejection fraction of 55-60%. The cavity size was normal. Left ventricular diastolic function could not be evaluated.  2. The right ventricle has normal systolc function. The cavity was normal. There is no increase in right ventricular wall thickness.  3. Left atrial size was mildly dilated.  4. Aneurysmal and mobile atrial septum but no obvious PFO by color flow or 2D imaging Bubble study not performed.  5. The aortic valve is tricuspid.  6. The aortic root is normal in size and structure.  7. 3D imaging of the atrial septum performed.  8. DCC: x 2 150 J then 200J biphasic. Patient converted from afib rate 98 to NSR rate 74 beats / min On Eliquis No immediate  neurologic sequleae.  _____________   History of Present Illness     63 y.o. male with recently diagnosed persistent atrial fibrillation with rapid ventricular response, history of heavy alcohol use, smoker, hyperlipidemia,  without evidence of structural heart disease or major risk factors (borderline hypertension diagnosis) was admitted 07/28 with chest pain, Afib, RVR.   Hospital Course     Consultants: None   Chest pain resolved with heart rate control and cardiac enzymes were negative.  He was put on IV Cardizem to help with rate control.  He was started on an oral beta-blocker and the IV Cardizem was able to be discontinued.  He was started on Eliquis. CHA2DS2-VASc = 0-1, so long-term anticoagulation is not required.  He is to be on Eliquis for 30 days, then aspirin 81 mg after that.  It was decided that he would benefit from sinus rhythm.  An echocardiogram was performed and reviewed.  He had no significant valvular abnormalities, his atria were normal in size and his EF was normal.  A TEE cardioversion was ordered and performed on 7/29.  Results are above.  He tolerated the procedure well.  Dr. Royann Shiversroitoru evaluated Patrick Padilla on 7/29 and reviewed all data.  After the cardioversion, he was maintaining sinus rhythm and ambulating well.  No further inpatient work-up is indicated and he is considered stable for discharge, to follow-up as an outpatient.  _____________  Discharge Vitals Blood pressure 123/78, pulse 84, temperature (!) 97.5 F (36.4 C), temperature source Oral, resp. rate 18, height 6\' 1"  (1.854 m), weight 91 kg, SpO2  98 %.  Filed Weights   02/04/19 0130 02/04/19 1513 02/05/19 0416  Weight: 94.8 kg 90.9 kg 91 kg    Labs & Radiologic Studies    CBC Recent Labs    02/03/19 1640 02/04/19 0316  WBC 18.0* 16.8*  HGB 17.6* 16.2  HCT 52.4* 48.4  MCV 98.1 97.8  PLT 250 163   Basic Metabolic Panel Recent Labs    02/03/19 1640 02/04/19 0316  NA 140 139  K  4.4 4.0  CL 108 108  CO2 18* 20*  GLUCOSE 125* 145*  BUN 7* 9  CREATININE 0.95 0.85  CALCIUM 9.2 9.3   Cardiac Enzymes High Sensitivity Troponin:   Recent Labs  Lab 02/03/19 1640 02/03/19 1833  TROPONINIHS 5 3      D-Dimer Recent Labs    02/03/19 1738  DDIMER 0.45   Hemoglobin A1C Lab Results  Component Value Date   HGBA1C 5.2 02/04/2019   Fasting Lipid Panel Recent Labs    02/04/19 0316  CHOL 135  HDL 78  LDLCALC 48  TRIG 47  CHOLHDL 1.7   Thyroid Function Tests  Recent Labs    02/05/19 0446  TSH 4.058   _____________  Dg Chest Portable 1 View  Result Date: 02/03/2019 CLINICAL DATA:  Pt here for central chest aching, especially when taking a deep breath, and shortness of breath that began this morning while sitting in his recliner. Denies sick contacts. No cough. Pt states fever of 99.0 today. Pt is SOB during exam. Hx of HTN. EXAM: PORTABLE CHEST - 1 VIEW COMPARISON:  10/19/2010 FINDINGS: Lungs are clear. Heart size and mediastinal contours are within normal limits. No effusion. Cervical fixation hardware. IMPRESSION: No acute cardiopulmonary disease. Electronically Signed   By: Lucrezia Europe M.D.   On: 02/03/2019 18:47   Disposition   Pt is being discharged home today in good condition.  Follow-up Plans & Appointments    Follow-up Information    Almyra Deforest, [place] Follow up on 02/21/2019.   Specialties: Cardiology, Radiology Why: Please arrive 15 minutes early for your 8:15am appointment - post-hospital follow-up.  Contact information: [address] [place] [address] [phone]          Discharge Instructions    Diet - low sodium heart healthy   Complete by: As directed    Increase activity slowly   Complete by: As directed       Discharge Medications   Allergies as of 02/05/2019   No Known Allergies     Medication List    TAKE these medications   apixaban 5 MG Tabs tablet Commonly known as: ELIQUIS Take 1 tablet  (5 mg total) by mouth 2 (two) times daily.   aspirin EC 81 MG tablet Take 81 mg by mouth daily.   metoprolol tartrate 50 MG tablet Commonly known as: LOPRESSOR Take 1 tablet (50 mg total) by mouth 2 (two) times daily.   multivitamin tablet Take 1 tablet by mouth daily.   nicotine 14 mg/24hr patch Commonly known as: NICODERM CQ - dosed in mg/24 hours Place 1 patch (14 mg total) onto the skin daily. Start taking on: February 06, 2019   OVER THE COUNTER MEDICATION Take 2 tablets by mouth daily. Equate brand   PARoxetine 20 MG tablet Commonly known as: PAXIL Take 20 mg by mouth daily.   simvastatin 20 MG tablet Commonly known as: ZOCOR Take 20 mg by mouth daily.          Outstanding  Labs/Studies   None  Duration of Discharge Encounter   Greater than 30 minutes including physician time.  Bobbye RiggsSigned, Rhonda Barrett NP 02/05/2019, 8:08 PM

## 2019-02-05 NOTE — Progress Notes (Signed)
  Echocardiogram 2D Echocardiogram has been performed.  Darlina Sicilian M 02/05/2019, 1:00 PM

## 2019-02-05 NOTE — Progress Notes (Signed)
Doing well, maintaining NSR 5 hours after DCCV. Eliquis for 30 days, then ASA 81 mg daily thereafter (CHADSVasc 0-1). Metoprolol 50 mg BID. Cautioned against abrupt cessation of beta blocker therapy. Needs to significantly reduce alcohol intake, preferably under 7 drinks per week. Follow up 3-4 weeks (in person, needs ECG). Sanda Klein, MD, [facility] [phone] office [phone] pager

## 2019-02-05 NOTE — Transfer of Care (Signed)
Immediate Anesthesia Transfer of Care Note  Patient: Patrick Padilla  Procedure(s) Performed: TRANSESOPHAGEAL ECHOCARDIOGRAM (TEE) (N/A ) CARDIOVERSION (N/A )  Patient Location: PACU  Anesthesia Type:General  Level of Consciousness: awake, alert , oriented and patient cooperative  Airway & Oxygen Therapy: Patient Spontanous Breathing and Patient connected to nasal cannula oxygen  Post-op Assessment: Report given to RN and Post -op Vital signs reviewed and stable  Post vital signs: Reviewed and stable  Last Vitals:  Vitals Value Taken Time  BP 84/47 02/05/19 1301  Temp 36.7 C 02/05/19 1301  Pulse 72 02/05/19 1304  Resp 19 02/05/19 1304  SpO2 95 % 02/05/19 1304  Vitals shown include unvalidated device data.  Last Pain:  Vitals:   02/05/19 1301  TempSrc: Temporal  PainSc: 0-No pain      Patients Stated Pain Goal: 0 (49/20/10 0712)  Complications: No apparent anesthesia complications

## 2019-02-05 NOTE — H&P (View-Only) (Signed)
Progress Note  Patient Name: Patrick BucklesJoel Padilla Date of Encounter: 02/05/2019  Primary Cardiologist: Thurmon FairMihai Cooper Moroney, MD   Subjective   Had a good night.  Denies dyspnea or other cardiovascular complaints.  Good ventricular rate control.  Transient bradycardia overnight, diltiazem stopped.  Inpatient Medications    Scheduled Meds: . apixaban  5 mg Oral BID  . folic acid  1 mg Oral Daily  . metoprolol tartrate  50 mg Oral BID  . multivitamin with minerals  1 tablet Oral Daily  . nicotine  14 mg Transdermal Daily  . PARoxetine  20 mg Oral Daily  . sodium chloride flush  3 mL Intravenous Q12H  . thiamine  100 mg Oral Daily   Or  . thiamine  100 mg Intravenous Daily   Continuous Infusions: . sodium chloride    . sodium chloride 10 mL/hr at 02/04/19 1125  . diltiazem (CARDIZEM) infusion Stopped (02/05/19 0127)   PRN Meds: acetaminophen, LORazepam **OR** LORazepam, ondansetron (ZOFRAN) IV, sodium chloride flush   Vital Signs    Vitals:   02/05/19 0109 02/05/19 0400 02/05/19 0416 02/05/19 0744  BP: 110/61  107/75 (!) 114/57  Pulse: 62  65 80  Resp: 18 17 19 19   Temp: 98.6 F (37 C)  98.2 F (36.8 C) 98.2 F (36.8 C)  TempSrc: Oral  Oral Oral  SpO2: 93%  94% 95%  Weight:   91 kg   Height:        Intake/Output Summary (Last 24 hours) at 02/05/2019 0959 Last data filed at 02/05/2019 0700 Gross per 24 hour  Intake 904.11 ml  Output -  Net 904.11 ml   Last 3 Weights 02/05/2019 02/04/2019 02/04/2019  Weight (lbs) 200 lb 9.9 oz 200 lb 8 oz 209 lb  Weight (kg) 91 kg 90.946 kg 94.802 kg      Telemetry    Atrial fibrillation with controlled ventricular response- Personally Reviewed  ECG    No new tracing- Personally Reviewed  Physical Exam  Looks very comfortable lying fully supine in bed GEN: No acute distress.   Neck: No JVD Cardiac:  Irregular, no murmurs, rubs, or gallops.  Respiratory: Clear to auscultation bilaterally. GI: Soft, nontender, non-distended  MS: No  edema; No deformity. Neuro:  Nonfocal  Psych: Normal affect   Labs    High Sensitivity Troponin:   Recent Labs  Lab 02/03/19 1640 02/03/19 1833  TROPONINIHS 5 3      Cardiac EnzymesNo results for input(s): TROPONINI in the last 168 hours. No results for input(s): TROPIPOC in the last 168 hours.   Chemistry Recent Labs  Lab 02/03/19 1640 02/04/19 0316  NA 140 139  K 4.4 4.0  CL 108 108  CO2 18* 20*  GLUCOSE 125* 145*  BUN 7* 9  CREATININE 0.95 0.85  CALCIUM 9.2 9.3  GFRNONAA >60 >60  GFRAA >60 >60  ANIONGAP 14 11     Hematology Recent Labs  Lab 02/03/19 1640 02/04/19 0316  WBC 18.0* 16.8*  RBC 5.34 4.95  HGB 17.6* 16.2  HCT 52.4* 48.4  MCV 98.1 97.8  MCH 33.0 32.7  MCHC 33.6 33.5  RDW 12.2 12.2  PLT 250 214    BNP Recent Labs  Lab 02/03/19 1738  BNP 103.1*     DDimer  Recent Labs  Lab 02/03/19 1738  DDIMER 0.45     Radiology    Dg Chest Portable 1 View  Result Date: 02/03/2019 CLINICAL DATA:  Pt here for central chest aching, especially  when taking a deep breath, and shortness of breath that began this morning while sitting in his recliner. Denies sick contacts. No cough. Pt states fever of 99.0 today. Pt is SOB during exam. Hx of HTN. EXAM: PORTABLE CHEST - 1 VIEW COMPARISON:  10/19/2010 FINDINGS: Lungs are clear. Heart size and mediastinal contours are within normal limits. No effusion. Cervical fixation hardware. IMPRESSION: No acute cardiopulmonary disease. Electronically Signed   By: Lucrezia Europe M.D.   On: 02/03/2019 18:47    Cardiac Studies   Echo 02/04/2019   1. The left ventricle has normal systolic function, with an ejection fraction of 55-60%. The cavity size was normal. There is moderately increased left ventricular wall thickness. Left ventricular diastolic function could not be evaluated secondary to  atrial fibrillation. No evidence of left ventricular regional wall motion abnormalities.  2. The mitral valve is abnormal. Mild  thickening of the mitral valve leaflet.  3. The tricuspid valve is grossly normal.  4. The aortic valve is tricuspid. No stenosis of the aortic valve.  5. The aorta is normal in size and structure.  6. The inferior vena cava was normal in size with <50% respiratory variability.   Patient Profile     63 y.o. male with recently diagnosed persistent atrial fibrillation with rapid ventricular response, history of heavy alcohol use, smoker, hyperlipidemia,  without evidence of structural heart disease or major risk factors (borderline hypertension diagnosis).  Assessment & Plan    1. AFib: Good rate control.  On oral anticoagulants. CHA2DS2-VASc Score 0-1 (borderline HTN).  Will probably discontinue apixaban after 30 days from cardioversion.  Normal size left and right atria.  For TEE-cardioversion today. This procedure has been fully reviewed with the patient and written informed consent has been obtained. 2. HTN: Normal blood pressure while receiving beta-blockers. 3. HLP: On simvastatin; excellent LDL cholesterol level. 4. Alcohol use: Discussed the known relationship between alcohol intake and the overall burden of atrial fibrillation.  Strongly encouraged reduction of alcohol use.   For questions or updates, please contact [place] Please consult [email or web address] for contact info under        Signed, Sanda Klein, MD  02/05/2019, 9:59 AM

## 2019-02-05 NOTE — Anesthesia Preprocedure Evaluation (Signed)
Anesthesia Evaluation  Patient identified by MRN, date of birth, ID band Patient awake    Reviewed: Allergy & Precautions, NPO status , Patient's Chart, lab work & pertinent test results  Airway Mallampati: I  TM Distance: >3 FB Neck ROM: Full    Dental  (+) Edentulous Upper, Missing, Dental Advisory Given, Chipped   Pulmonary Current Smoker,  02/03/2019 SARS coronavirus NEG   breath sounds clear to auscultation       Cardiovascular METS: no home meds. hypertension, (-) angina+ dysrhythmias Atrial Fibrillation  Rhythm:Irregular Rate:Normal  02/04/2019 ECHO: EF 55-60%, valves OK   Neuro/Psych Anxiety negative neurological ROS     GI/Hepatic negative GI ROS, Neg liver ROS,   Endo/Other  negative endocrine ROS  Renal/GU negative Renal ROS     Musculoskeletal   Abdominal   Peds  Hematology negative hematology ROS (+)   Anesthesia Other Findings   Reproductive/Obstetrics                             Anesthesia Physical Anesthesia Plan  ASA: III  Anesthesia Plan: MAC   Post-op Pain Management:    Induction:   PONV Risk Score and Plan: 0 and Treatment may vary due to age or medical condition  Airway Management Planned: Natural Airway and Nasal Cannula  Additional Equipment:   Intra-op Plan:   Post-operative Plan:   Informed Consent: I have reviewed the patients History and Physical, chart, labs and discussed the procedure including the risks, benefits and alternatives for the proposed anesthesia with the patient or authorized representative who has indicated his/her understanding and acceptance.     Dental advisory given  Plan Discussed with: CRNA and Surgeon  Anesthesia Plan Comments:         Anesthesia Quick Evaluation

## 2019-02-10 ENCOUNTER — Telehealth: Payer: Self-pay | Admitting: Cardiovascular Disease

## 2019-02-10 NOTE — Telephone Encounter (Signed)
Patient contacted regarding discharge from [facility] on 02/05/19.  Patient understands to follow up with provider Almyra Deforest, [place] on 02/21/2019 at [place] [facility]. Patient understands discharge instructions? YES Patient understands medications and regiment? YES Patient understands to bring all medications to this visit? YES  Patient has first 30 days of Eliquis for free. May need patient assistance

## 2019-02-10 NOTE — Telephone Encounter (Signed)
New Message   Patient states that he is returning call that he received today. It shows someone calls but not what the call was about. Please call to discuss.

## 2019-02-10 NOTE — Telephone Encounter (Signed)
duplicate

## 2019-02-21 ENCOUNTER — Ambulatory Visit (INDEPENDENT_AMBULATORY_CARE_PROVIDER_SITE_OTHER): Payer: Medicare Other | Admitting: Physician Assistant

## 2019-02-21 ENCOUNTER — Encounter: Payer: Self-pay | Admitting: Physician Assistant

## 2019-02-21 VITALS — BP 155/103 | HR 90 | Ht 73.0 in | Wt 201.0 lb

## 2019-02-21 DIAGNOSIS — I480 Paroxysmal atrial fibrillation: Secondary | ICD-10-CM

## 2019-02-21 DIAGNOSIS — E7800 Pure hypercholesterolemia, unspecified: Secondary | ICD-10-CM

## 2019-02-21 DIAGNOSIS — F1011 Alcohol abuse, in remission: Secondary | ICD-10-CM

## 2019-02-21 NOTE — Progress Notes (Signed)
Cardiology Office Note    Date:  02/21/2019   ID:  Patrick BucklesJoel Holle, DOB 1955/11/13, MRN [id number]  PCP:  Lavell Islamloward, Davis L, MD  Cardiologist:  Dr. Royann Shiversroitoru   Chief Complaint  Patient presents with   Follow-up    seen for Dr. Royann Shiversroitoru. Post cardioversion    History of Present Illness:  Patrick BucklesJoel Padilla is a 63 y.o. male with past medical history of alcohol abuse, tobacco abuse, hyperlipidemia, and recently diagnosed persistent atrial fibrillation.  She was recently admitted to the hospital on 7/820/2020 with chest pain and atrial fibrillation with RVR.  Chest pain resolved with a rate control.  Cardiac enzyme was negative.  Patient was placed on IV diltiazem and later transitioned to oral beta-blocker.  His CHA2DS2-Vasc score is (0-1), therefore long-term anticoagulation is not required.  A successful TEE cardioversion was performed on 7/29, the plan is to for him to continue Eliquis for 30 days, then transition to aspirin 81 mg after that.  TEE showed mild LAE. Echocardiogram obtained on 01/27/2019 shows EF of 55 to 60%, no significant valve disease.   Patient presents today for cardiology office visit.  He denies any recent chest pain or shortness of breath.  He is still under a lot of emotional stress as he is going through a divorce.  EKG demonstrated he is back in atrial fibrillation with heart rate of 94.  He does not have any cardiac awareness of this.  He has no lower extremity edema, orthopnea or PND.  I recommended increase his metoprolol to 100 mg twice daily.  I will see him back in 1 week, if he is still in atrial fibrillation, I likely will arrange for repeat DC cardioversion.    Past Medical History:  Diagnosis Date   Anxiety    Elevated LFTs    History of colon polyps    HLD (hyperlipidemia)    HTN (hypertension)    Localized superficial swelling, mass, or lump     Past Surgical History:  Procedure Laterality Date   CARDIOVERSION N/A 02/05/2019   Procedure:  CARDIOVERSION;  Surgeon: Wendall StadeNishan, Peter C, MD;  Location: [facility] ENDOSCOPY;  Service: Cardiovascular;  Laterality: N/A;   CERVICAL DISC SURGERY     LEG SURGERY     right leg and ankle   NECK SURGERY     cyst removed   TEE WITHOUT CARDIOVERSION N/A 02/05/2019   Procedure: TRANSESOPHAGEAL ECHOCARDIOGRAM (TEE);  Surgeon: Wendall StadeNishan, Peter C, MD;  [facility];  Service: Cardiovascular;  Laterality: N/A;   TOTAL HIP ARTHROPLASTY     left    Current Medications: Outpatient Medications Prior to Visit  Medication Sig Dispense Refill   apixaban (ELIQUIS) 5 MG TABS tablet Take 1 tablet (5 mg total) by mouth 2 (two) times daily. 60 tablet 0   metoprolol tartrate (LOPRESSOR) 50 MG tablet Take 1 tablet (50 mg total) by mouth 2 (two) times daily. 60 tablet 11   Multiple Vitamin (MULTIVITAMIN) tablet Take 1 tablet by mouth daily.     OVER THE COUNTER MEDICATION Take 2 tablets by mouth daily. Equate brand     PARoxetine (PAXIL) 20 MG tablet Take 20 mg by mouth daily.     aspirin EC 81 MG tablet Take 81 mg by mouth daily.     nicotine (NICODERM CQ - DOSED IN MG/24 HOURS) 14 mg/24hr patch Place 1 patch (14 mg total) onto the skin daily. 28 patch 0   simvastatin (ZOCOR) 20 MG tablet Take 20 mg by mouth  daily.     No facility-administered medications prior to visit.      Allergies:   Patient has no known allergies.   Social History   Socioeconomic History   Marital status: Married    Spouse name: Not on file   Number of children: 2   Years of education: Not on file   Highest education level: Not on file  Occupational History   Occupation: disabled    Fish farm manager: SELF-EMPLOYED  Social Designer, fashion/clothing strain: Not on file   Food insecurity    Worry: Not on file    Inability: Not on file   Transportation needs    Medical: Not on file    Non-medical: Not on file  Tobacco Use   Smoking status: Current Every Day Smoker   Smokeless tobacco: Never Used    Substance and Sexual Activity   Alcohol use: Yes    Comment: 3-4 a day   Drug use: No   Sexual activity: Not on file  Lifestyle   Physical activity    Days per week: Not on file    Minutes per session: Not on file   Stress: Not on file  Relationships   Social connections    Talks on phone: Not on file    Gets together: Not on file    Attends religious service: Not on file    Active member of club or organization: Not on file    Attends meetings of clubs or organizations: Not on file    Relationship status: Not on file  Other Topics Concern   Not on file  Social History Narrative   Not on file     Family History:  The patient's family history includes Clotting disorder in his mother; Colon cancer in his father; Heart disease in his brother; Hepatitis in his mother; Rheumatic fever in his brother; Stroke in his mother; Thyroid disease in his mother.   ROS:   Please see the history of present illness.    ROS All other systems reviewed and are negative.   PHYSICAL EXAM:   VS:  BP (!) 155/103    Pulse 90    Ht 6\' 1"  (1.854 m)    Wt 201 lb (91.2 kg)    SpO2 99%    BMI 26.52 kg/m    GEN: Well nourished, well developed, in no acute distress  HEENT: normal  Neck: no JVD, carotid bruits, or masses Cardiac: Irregularly irregular; no murmurs, rubs, or gallops,no edema  Respiratory:  clear to auscultation bilaterally, normal work of breathing GI: soft, nontender, nondistended, + BS MS: no deformity or atrophy  Skin: warm and dry, no rash Neuro:  Alert and Oriented x 3, Strength and sensation are intact Psych: euthymic mood, full affect  Wt Readings from Last 3 Encounters:  02/21/19 201 lb (91.2 kg)  02/05/19 200 lb 9.9 oz (91 kg)  12/26/11 194 lb (88 kg)      Studies/Labs Reviewed:   EKG:  EKG is ordered today.  The ekg ordered today demonstrates atrial fibrillation without significant ST-T wave changes, poor R wave progression anterior leads  Recent  Labs: 02/03/2019: B Natriuretic Peptide 103.1 02/04/2019: BUN 9; Creatinine, Ser 0.85; Hemoglobin 16.2; Platelets 214; Potassium 4.0; Sodium 139 02/05/2019: TSH 4.058   Lipid Panel    Component Value Date/Time   CHOL 135 02/04/2019 0316   TRIG 47 02/04/2019 0316   HDL 78 02/04/2019 0316   CHOLHDL 1.7 02/04/2019 0316   VLDL 9  02/04/2019 0316   LDLCALC 48 02/04/2019 0316    Additional studies/ records that were reviewed today include:   Echo 02/04/2019   1. The left ventricle has normal systolic function, with an ejection fraction of 55-60%. The cavity size was normal. There is moderately increased left ventricular wall thickness. Left ventricular diastolic function could not be evaluated secondary to  atrial fibrillation. No evidence of left ventricular regional wall motion abnormalities.  2. The mitral valve is abnormal. Mild thickening of the mitral valve leaflet.  3. The tricuspid valve is grossly normal.  4. The aortic valve is tricuspid. No stenosis of the aortic valve.  5. The aorta is normal in size and structure.  6. The inferior vena cava was normal in size with <50% respiratory variability.    ASSESSMENT:    1. Paroxysmal atrial fibrillation (HCC)   2. H/O ETOH abuse   3. Pure hypercholesterolemia      PLAN:  In order of problems listed above:  1. Paroxysmal atrial fibrillation: Recently underwent TEE cardioversion, unfortunately patient has since went back into atrial fibrillation based on today's EKG.  Will increase metoprolol to 100 mg twice daily for better rate control.  I will bring the patient back in 1 week for reassessment, if he is still in atrial fibrillation, I will consider arrange for repeat DC cardioversion.  Recent TEE revealed no left atrial thrombus, mild LAE.  To avoid any future recurrence, he will need to limit alcohol consumption.  Recent TSH was normal.  2. Alcohol abuse: We will need strict limitation of the number of alcohol assumption.   Emphasis has been placed on avoidance of large quantity of alcohol.  3. Hyperlipidemia: Patient says his physician stopped the Zocor.  Recent lab work showed very well controlled cholesterol.    Medication Adjustments/Labs and Tests Ordered: Current medicines are reviewed at length with the patient today.  Concerns regarding medicines are outlined above.  Medication changes, Labs and Tests ordered today are listed in the Patient Instructions below. Patient Instructions  Medication Instructions:   INCREASE Metoprolol Tartrate to 100 mg 2 times a day If you need a refill on your cardiac medications before your next appointment, please call your pharmacy.   Lab work: NONE ordered at this time of appointment   If you have labs (blood work) drawn today and your tests are completely normal, you will receive your results only by:  MyChart Message (if you have MyChart) OR  A paper copy in the mail If you have any lab test that is abnormal or we need to change your treatment, we will call you to review the results.  Testing/Procedures: NONE ordered at this time of appointment   Follow-Up: At [facility], you and your health needs are our priority.  As part of our continuing mission to provide you with exceptional heart care, we have created designated Provider Care Teams.  These Care Teams include your primary Cardiologist (physician) and Advanced Practice Providers (APPs -  Physician Assistants and Nurse Practitioners) who all work together to provide you with the care you need, when you need it.  Your physician recommends that you schedule a follow-up appointment in 1 week with Azalee CourseHao Heily Carlucci, PA-C  Any Other Special Instructions Will Be Listed Below (If Applicable).       Ramond DialSigned, Dalessandro Baldyga, [place]  02/21/2019 9:26 AM    [facility] [address], [place], [place]  [address] Phone: [phone]; Fax: [phone]

## 2019-02-21 NOTE — Patient Instructions (Signed)
Medication Instructions:   INCREASE Metoprolol Tartrate to 100 mg 2 times a day If you need a refill on your cardiac medications before your next appointment, please call your pharmacy.   Lab work: NONE ordered at this time of appointment   If you have labs (blood work) drawn today and your tests are completely normal, you will receive your results only by: Marland Kitchen MyChart Message (if you have MyChart) OR . A paper copy in the mail If you have any lab test that is abnormal or we need to change your treatment, we will call you to review the results.  Testing/Procedures: NONE ordered at this time of appointment   Follow-Up: At [facility], you and your health needs are our priority.  As part of our continuing mission to provide you with exceptional heart care, we have created designated Provider Care Teams.  These Care Teams include your primary Cardiologist (physician) and Advanced Practice Providers (APPs -  Physician Assistants and Nurse Practitioners) who all work together to provide you with the care you need, when you need it. . Your physician recommends that you schedule a follow-up appointment in 1 week with Almyra Deforest, PA-C  Any Other Special Instructions Will Be Listed Below (If Applicable).

## 2019-02-23 NOTE — Progress Notes (Signed)
May need an antiarrhythmic. Multaq 400 mg BID if has insurance. Flecainide another option, but would need a stress Myoview or coronary CTA first.

## 2019-02-27 ENCOUNTER — Ambulatory Visit (INDEPENDENT_AMBULATORY_CARE_PROVIDER_SITE_OTHER): Payer: Medicare Other | Admitting: Physician Assistant

## 2019-02-27 ENCOUNTER — Other Ambulatory Visit: Payer: Self-pay

## 2019-02-27 VITALS — BP 128/72 | HR 84 | Ht 73.0 in | Wt 203.4 lb

## 2019-02-27 DIAGNOSIS — Z79899 Other long term (current) drug therapy: Secondary | ICD-10-CM

## 2019-02-27 DIAGNOSIS — F1010 Alcohol abuse, uncomplicated: Secondary | ICD-10-CM

## 2019-02-27 DIAGNOSIS — E7800 Pure hypercholesterolemia, unspecified: Secondary | ICD-10-CM | POA: Diagnosis not present

## 2019-02-27 DIAGNOSIS — I4819 Other persistent atrial fibrillation: Secondary | ICD-10-CM | POA: Diagnosis not present

## 2019-02-27 NOTE — Patient Instructions (Addendum)
Medication Instructions:   Continue taking Metoprolol Tartrate 100 mg 2 times a day Your physician recommends that you continue on your current medications as directed. Please refer to the Current Medication list given to you today.  If you need a refill on your cardiac medications before your next appointment, please call your pharmacy.   Lab work: You will need to have labs (blood work) drawn before your next appointment:  CBC  If you have labs (blood work) drawn today and your tests are completely normal, you will receive your results only by: Marland Kitchen MyChart Message (if you have MyChart) OR . A paper copy in the mail If you have any lab test that is abnormal or we need to change your treatment, we will call you to review the results.  Testing/Procedures: NONE ordered at this time of appointment   Follow-Up: At [facility], you and your health needs are our priority.  As part of our continuing mission to provide you with exceptional heart care, we have created designated Provider Care Teams.  These Care Teams include your primary Cardiologist (physician) and Advanced Practice Providers (APPs -  Physician Assistants and Nurse Practitioners) who all work together to provide you with the care you need, when you need it. . You will need a follow up appointment in 2-3 months with Sanda Klein, MD   Any Other Special Instructions Will Be Listed Below (If Applicable).

## 2019-02-27 NOTE — Progress Notes (Signed)
Cardiology Office Note    Date:  03/01/2019   ID:  Patrick Padilla, DOB 1955/11/11, MRN [id number]  PCP:  Patrick Padilla, Patrick L, MD  Cardiologist: Dr. Royann Padilla  Chief Complaint  Patient presents with   Follow-up    seen for Dr. Royann Padilla    History of Present Illness:  Patrick BucklesJoel Padilla is a 63 y.o. male with past medical history of alcohol abuse, tobacco abuse, hyperlipidemia, and recently diagnosed persistent atrial fibrillation.  She was recently admitted to the hospital on 7/820/2020 with chest pain and atrial fibrillation with RVR.  Chest pain resolved with a rate control.  Cardiac enzyme was negative.  Patient was placed on IV diltiazem and later transitioned to oral beta-blocker.  His CHA2DS2-Vasc score is (0-1), therefore long-term anticoagulation is not required.  A successful TEE cardioversion was performed on 7/29, the plan is for him to continue Eliquis for 30 days, then transition to aspirin 81 mg after that.  TEE showed mild LAE. Echocardiogram obtained on 01/27/2019 shows EF of 55 to 60%, no significant valve disease.   Unfortunately, he has went back into atrial fibrillation when I saw him a week ago on 02/21/2019.  He was under a lot of emotional stress as he was going through a divorce.  I increased his metoprolol to 100 mg twice daily for better rate control.  He returned today for follow-up.  He has high likelihood of recurrent A. fib given alcohol use.   Patient presents today for follow-up.  He remains in atrial fibrillation, however heart rate improved.  I will continue him on 100 mg twice daily of metoprolol.  Eliquis is costing him close to $600, we have started on the medication assistance for him.  We discussed various options with regard to anticoagulation therapy include Coumadin, Pradaxa, Xarelto, Eliquis and Sayvesa.  We also discussed various antiarrhythmic medications and the risk and benefit include multaq, flecainide, amiodarone and Tikosyn.  He is going through a divorce and  he does not think he can afford stress testing, coronary CT or multaq.  He does not have prescription part D plan.  Given lack of symptoms such as shortness of breath, fatigue or palpitation, he opted to remain in atrial fibrillation with good rate control therapy.  I think this is quite reasonable.  Unfortunately, he continues to drink roughly 3 shot glasses of whiskey per night.  He is aware that we will call increase the risk of recurrent atrial fibrillation and make it more difficult for him to come out of A. fib.  I highly advised him to stop drinking.  He likely will require anticoagulation long-term.   Past Medical History:  Diagnosis Date   Anxiety    Elevated LFTs    History of colon polyps    HLD (hyperlipidemia)    HTN (hypertension)    Localized superficial swelling, mass, or lump     Past Surgical History:  Procedure Laterality Date   CARDIOVERSION N/A 02/05/2019   Procedure: CARDIOVERSION;  Surgeon: Wendall StadeNishan, Peter C, MD;  Location: [facility] ENDOSCOPY;  Service: Cardiovascular;  Laterality: N/A;   CERVICAL DISC SURGERY     LEG SURGERY     right leg and ankle   NECK SURGERY     cyst removed   TEE WITHOUT CARDIOVERSION N/A 02/05/2019   Procedure: TRANSESOPHAGEAL ECHOCARDIOGRAM (TEE);  Surgeon: Wendall StadeNishan, Peter C, MD;  [facility];  Service: Cardiovascular;  Laterality: N/A;   TOTAL HIP ARTHROPLASTY     left    Current  Medications: Outpatient Medications Prior to Visit  Medication Sig Dispense Refill   metoprolol tartrate (LOPRESSOR) 100 MG tablet Take 100 mg by mouth 2 (two) times daily.     apixaban (ELIQUIS) 5 MG TABS tablet Take 1 tablet (5 mg total) by mouth 2 (two) times daily. 60 tablet 0   metoprolol tartrate (LOPRESSOR) 50 MG tablet Take 1 tablet (50 mg total) by mouth 2 (two) times daily. (Patient not taking: Reported on 02/27/2019) 60 tablet 11   Multiple Vitamin (MULTIVITAMIN) tablet Take 1 tablet by mouth daily.     PARoxetine (PAXIL) 20 MG  tablet Take 20 mg by mouth daily.     OVER THE COUNTER MEDICATION Take 2 tablets by mouth daily. Equate brand     No facility-administered medications prior to visit.      Allergies:   Patient has no known allergies.   Social History   Socioeconomic History   Marital status: Married    Spouse name: Not on file   Number of children: 2   Years of education: Not on file   Highest education level: Not on file  Occupational History   Occupation: disabled    Fish farm manager: SELF-EMPLOYED  Social Designer, fashion/clothing strain: Not on file   Food insecurity    Worry: Not on file    Inability: Not on file   Transportation needs    Medical: Not on file    Non-medical: Not on file  Tobacco Use   Smoking status: Current Every Day Smoker   Smokeless tobacco: Never Used  Substance and Sexual Activity   Alcohol use: Yes    Comment: 3-4 a day   Drug use: No   Sexual activity: Not on file  Lifestyle   Physical activity    Days per week: Not on file    Minutes per session: Not on file   Stress: Not on file  Relationships   Social connections    Talks on phone: Not on file    Gets together: Not on file    Attends religious service: Not on file    Active member of club or organization: Not on file    Attends meetings of clubs or organizations: Not on file    Relationship status: Not on file  Other Topics Concern   Not on file  Social History Narrative   Not on file     Family History:  The patient's family history includes Clotting disorder in his mother; Colon cancer in his father; Heart disease in his brother; Hepatitis in his mother; Rheumatic fever in his brother; Stroke in his mother; Thyroid disease in his mother.   ROS:   Please see the history of present illness.    ROS All other systems reviewed and are negative.   PHYSICAL EXAM:   VS:  BP 128/72    Pulse 84    Ht 6\' 1"  (1.854 m)    Wt 203 lb 6.4 oz (92.3 kg)    BMI 26.84 kg/m    GEN: Well  nourished, well developed, in no acute distress  HEENT: normal  Neck: no JVD, carotid bruits, or masses Cardiac: irregular irregular; no murmurs, rubs, or gallops,no edema  Respiratory:  clear to auscultation bilaterally, normal work of breathing GI: soft, nontender, nondistended, + BS MS: no deformity or atrophy  Skin: warm and dry, no rash Neuro:  Alert and Oriented x 3, Strength and sensation are intact Psych: euthymic mood, full affect  Wt Readings from Last  3 Encounters:  02/27/19 203 lb 6.4 oz (92.3 kg)  02/21/19 201 lb (91.2 kg)  02/05/19 200 lb 9.9 oz (91 kg)      Studies/Labs Reviewed:   EKG:  EKG is ordered today.  The ekg ordered today demonstrates atrial fibrillation, poor R wave progression in anterior leads.  Recent Labs: 02/03/2019: B Natriuretic Peptide 103.1 02/04/2019: BUN 9; Creatinine, Ser 0.85; Hemoglobin 16.2; Platelets 214; Potassium 4.0; Sodium 139 02/05/2019: TSH 4.058   Lipid Panel    Component Value Date/Time   CHOL 135 02/04/2019 0316   TRIG 47 02/04/2019 0316   HDL 78 02/04/2019 0316   CHOLHDL 1.7 02/04/2019 0316   VLDL 9 02/04/2019 0316   LDLCALC 48 02/04/2019 0316    Additional studies/ records that were reviewed today include:   Echo 02/04/2019 1. The left ventricle has normal systolic function, with an ejection fraction of 55-60%. The cavity size was normal. There is moderately increased left ventricular wall thickness. Left ventricular diastolic function could not be evaluated secondary to  atrial fibrillation. No evidence of left ventricular regional wall motion abnormalities.  2. The mitral valve is abnormal. Mild thickening of the mitral valve leaflet.  3. The tricuspid valve is grossly normal.  4. The aortic valve is tricuspid. No stenosis of the aortic valve.  5. The aorta is normal in size and structure.  6. The inferior vena cava was normal in size with <50% respiratory variability.    ASSESSMENT:    1. Persistent atrial  fibrillation   2. Medication management   3. Pure hypercholesterolemia   4. Alcohol abuse      PLAN:  In order of problems listed above:  1. Persistent atrial fibrillation: Although his CHA2DS2-Vasc score is low, his alcohol intake likely make conversion to sinus sinus rhythm very difficult.  I increased his metoprolol to 100 mg twice daily.  He is still in atrial fibrillation based on today's EKG.  Dr. Royann Padilla recommended consideration of antiarrhythmic therapy include multaq versus flecainide.  I set down with the patient today and had a long discussion between different antiarrhythmic therapies include multaq, flecainide, amiodarone, and Tikosyn.  We also discussed the different anticoagulation therapies include Coumadin, Pradaxa, Eliquis and Xarelto.  He says he is completely asymptomatic, he is going through a divorce and is in significant financial difficulty.  At this time he does not wish to consider antiarrhythmic therapy.  I will hold off on cardioversion and antiarrhythmic therapy at this time.  He is aware that the longer he stays in atrial fibrillation, the less likelihood he will convert back to sinus rhythm in the future.  Although Xarelto is currently causing him $600, however when I discussed with him benefit and risk of the Coumadin therapy, he says his eating habit is very poor and unlikely to be compliant with fixed amount of vegetable on a weekly basis.  The increased bleeding risk with Coumadin also does not interest him either.  We will proceed with Xarelto medication assistance program.  2. Alcohol abuse: Unfortunately he still drinks 3 shots glass of whiskey per night.  He is aware of the correlation between alcohol abuse and atrial fibrillation  3. Hyperlipidemia: Recent lipid panel shows very well-controlled cholesterol despite not on any statins.  Although he carries a diagnosis of hyperlipidemia, there is really no evidence to support this.    Medication  Adjustments/Labs and Tests Ordered: Current medicines are reviewed at length with the patient today.  Concerns regarding medicines are outlined above.  Medication changes, Labs and Tests ordered today are listed in the Patient Instructions below. Patient Instructions  Medication Instructions:   Continue taking Metoprolol Tartrate 100 mg 2 times a day Your physician recommends that you continue on your current medications as directed. Please refer to the Current Medication list given to you today.  If you need a refill on your cardiac medications before your next appointment, please call your pharmacy.   Lab work: You will need to have labs (blood work) drawn before your next appointment:  CBC  If you have labs (blood work) drawn today and your tests are completely normal, you will receive your results only by:  MyChart Message (if you have MyChart) OR  A paper copy in the mail If you have any lab test that is abnormal or we need to change your treatment, we will call you to review the results.  Testing/Procedures: NONE ordered at this time of appointment   Follow-Up: At [facility], you and your health needs are our priority.  As part of our continuing mission to provide you with exceptional heart care, we have created designated Provider Care Teams.  These Care Teams include your primary Cardiologist (physician) and Advanced Practice Providers (APPs -  Physician Assistants and Nurse Practitioners) who all work together to provide you with the care you need, when you need it.  You will need a follow up appointment in 2-3 months with Thurmon FairMihai Croitoru, MD   Any Other Special Instructions Will Be Listed Below (If Applicable).       Ramond DialSigned, Shawnique Mariotti, [place]  03/01/2019 11:21 AM    [facility] [address], [place], [place]  [address] Phone: [phone]; Fax: [phone]

## 2019-02-28 ENCOUNTER — Telehealth: Payer: Self-pay

## 2019-02-28 NOTE — Telephone Encounter (Signed)
Patient is approved for Eliquis  Free of charge from 02/28/2019-07/10/2019.

## 2019-02-28 NOTE — Telephone Encounter (Signed)
Called and left a message for the patient to give me a call back about his patient assistance form for his Eliquis.

## 2019-03-01 ENCOUNTER — Encounter: Payer: Self-pay | Admitting: Physician Assistant

## 2019-05-27 ENCOUNTER — Ambulatory Visit (INDEPENDENT_AMBULATORY_CARE_PROVIDER_SITE_OTHER): Payer: Medicare Other | Admitting: Cardiovascular Disease

## 2019-05-27 ENCOUNTER — Other Ambulatory Visit: Payer: Self-pay

## 2019-05-27 ENCOUNTER — Encounter: Payer: Self-pay | Admitting: Cardiovascular Disease

## 2019-05-27 VITALS — BP 124/80 | HR 84 | Temp 97.0°F | Ht 73.0 in | Wt 195.0 lb

## 2019-05-27 DIAGNOSIS — I4819 Other persistent atrial fibrillation: Secondary | ICD-10-CM

## 2019-05-27 NOTE — Progress Notes (Signed)
Cardiology Office Note:    Date:  05/29/2019   ID:  Patrick Padilla, DOB 05-13-1956, MRN [id number]  PCP:  Lavell Islam, MD  Cardiologist:  Thurmon Fair, MD  Electrophysiologist:  None   Referring MD: Lavell Islam, MD   Chief Complaint  Patient presents with  . Atrial Fibrillation    3 months.    History of Present Illness:    Patrick Padilla is a 63 y.o. male with a hx of  Alcohol and tobacco abuse, hyperlipidemia (questionable) and persistent atrial fibrillation.  He was hospitalized in July 2020 with chest pain in the setting of atrial fibrillation with rapid ventricular response and underwent TEE cardioversion on July 29.  Echo showed normal left ventricular systolic function and no valvular abnormalities.  By the time he returned for a follow-up visit on August 14, he was back in atrial fibrillation.With good rate control, he is asymptomatic.  He continues to be under emotional stress due to his divorce and continues to drink, in my opinion, excessively.  He does not have prescription drug coverage.  Awaiting response on Eliquis patient assistance program.  Notes that his embolic risk is low (CHA2DS2-VASc 0-1).  Past Medical History:  Diagnosis Date  . Anxiety   . Elevated LFTs   . History of colon polyps   . HLD (hyperlipidemia)   . HTN (hypertension)   . Localized superficial swelling, mass, or lump     Past Surgical History:  Procedure Laterality Date  . CARDIOVERSION N/A 02/05/2019   Procedure: CARDIOVERSION;  Surgeon: Wendall Stade, MD;  [facility];  Service: Cardiovascular;  Laterality: N/A;  . CERVICAL DISC SURGERY    . LEG SURGERY     right leg and ankle  . NECK SURGERY     cyst removed  . TEE WITHOUT CARDIOVERSION N/A 02/05/2019   Procedure: TRANSESOPHAGEAL ECHOCARDIOGRAM (TEE);  Surgeon: Wendall Stade, MD;  Location[facility] ENDOSCOPY;  Service: Cardiovascular;  Laterality: N/A;  . TOTAL HIP ARTHROPLASTY     left    Current Medications:  Current Meds  Medication Sig  . apixaban (ELIQUIS) 5 MG TABS tablet Take 1 tablet (5 mg total) by mouth 2 (two) times daily.  . metoprolol tartrate (LOPRESSOR) 100 MG tablet Take 100 mg by mouth 2 (two) times daily.  . metoprolol tartrate (LOPRESSOR) 50 MG tablet Take 1 tablet (50 mg total) by mouth 2 (two) times daily.  . Multiple Vitamin (MULTIVITAMIN) tablet Take 1 tablet by mouth daily.  Marland Kitchen PARoxetine (PAXIL) 20 MG tablet Take 20 mg by mouth daily.     Allergies:   Patient has no known allergies.   Social History   Socioeconomic History  . Marital status: Married    Spouse name: Not on file  . Number of children: 2  . Years of education: Not on file  . Highest education level: Not on file  Occupational History  . Occupation: disabled    Associate Professor: SELF-EMPLOYED  Social Needs  . Financial resource strain: Not on file  . Food insecurity    Worry: Not on file    Inability: Not on file  . Transportation needs    Medical: Not on file    Non-medical: Not on file  Tobacco Use  . Smoking status: Current Every Day Smoker  . Smokeless tobacco: Never Used  Substance and Sexual Activity  . Alcohol use: Yes    Comment: 3-4 a day  . Drug use: No  . Sexual activity: Not on  file  Lifestyle  . Physical activity    Days per week: Not on file    Minutes per session: Not on file  . Stress: Not on file  Relationships  . Social Herbalist on phone: Not on file    Gets together: Not on file    Attends religious service: Not on file    Active member of club or organization: Not on file    Attends meetings of clubs or organizations: Not on file    Relationship status: Not on file  Other Topics Concern  . Not on file  Social History Narrative  . Not on file     Family History: The patient's family history includes Clotting disorder in his mother; Colon cancer in his father; Heart disease in his brother; Hepatitis in his mother; Rheumatic fever in his brother; Stroke in  his mother; Thyroid disease in his mother.  ROS:   Please see the history of present illness.     All other systems reviewed and are negative.  EKGs/Labs/Other Studies Reviewed:    The following studies were reviewed today: Transthoracic echocardiogram and transesophageal echocardiogram July 2020  EKG:  EKG is  ordered today.  The ekg ordered today demonstrates atrial fibrillation with controlled ventricular response and T wave inversion limited to leads V1-V2, otherwise normal repolarization, QTC 425 ms  Recent Labs: 02/03/2019: B Natriuretic Peptide 103.1 02/04/2019: BUN 9; Creatinine, Ser 0.85; Hemoglobin 16.2; Platelets 214; Potassium 4.0; Sodium 139 02/05/2019: TSH 4.058  Recent Lipid Panel    Component Value Date/Time   CHOL 135 02/04/2019 0316   TRIG 47 02/04/2019 0316   HDL 78 02/04/2019 0316   CHOLHDL 1.7 02/04/2019 0316   VLDL 9 02/04/2019 0316   LDLCALC 48 02/04/2019 0316    Physical Exam:    VS:  BP 124/80 (BP Location: Left Arm, Patient Position: Sitting, Cuff Size: Normal)   Pulse 84   Temp (!) 97 F (36.1 C)   Ht 6\' 1"  (1.854 m)   Wt 195 lb (88.5 kg)   BMI 25.73 kg/m     Wt Readings from Last 3 Encounters:  05/27/19 195 lb (88.5 kg)  02/27/19 203 lb 6.4 oz (92.3 kg)  02/21/19 201 lb (91.2 kg)     GEN:  Well nourished, well developed in no acute distress HEENT: Normal NECK: No JVD; No carotid bruits LYMPHATICS: No lymphadenopathy CARDIAC: Irregular, no murmurs, rubs, gallops RESPIRATORY:  Clear to auscultation without rales, wheezing or rhonchi  ABDOMEN: Soft, non-tender, non-distended MUSCULOSKELETAL:  No edema; No deformity  SKIN: Warm and dry NEUROLOGIC:  Alert and oriented x 3 PSYCHIATRIC:  Normal affect   ASSESSMENT:    1. Persistent atrial fibrillation (HCC)    PLAN:    In order of problems listed above:  AFib: He is asymptomatic while he is well rate controlled. I think we are compelled to stick to a rate control strategy.  He does  not want to be hospitalized or to take additional antiarrhythmic medications.  It makes little sense to pursue another cardioversion without a change to therapy.    I do not think he is an ablation candidate until he curtail his use of alcohol.  I discussed the fact that the longer he stays in atrial fibrillation the lesser our chances of returning him to stable normal rhythm in the long run.  I strongly encouraged reduction in alcohol use.  Medication Adjustments/Labs and Tests Ordered: Current medicines are reviewed at length with  the patient today.  Concerns regarding medicines are outlined above.  Orders Placed This Encounter  Procedures  . EKG 12-Lead   No orders of the defined types were placed in this encounter.   Patient Instructions  Medication Instructions:  No changes *If you need a refill on your cardiac medications before your next appointment, please call your pharmacy*  Lab Work: None ordered If you have labs (blood work) drawn today and your tests are completely normal, you will receive your results only by: Marland Kitchen. MyChart Message (if you have MyChart) OR . A paper copy in the mail If you have any lab test that is abnormal or we need to change your treatment, we will call you to review the results.  Testing/Procedures: None ordered  Follow-Up: At [facility], you and your health needs are our priority.  As part of our continuing mission to provide you with exceptional heart care, we have created designated Provider Care Teams.  These Care Teams include your primary Cardiologist (physician) and Advanced Practice Providers (APPs -  Physician Assistants and Nurse Practitioners) who all work together to provide you with the care you need, when you need it.  Your next appointment:   Follow up in 3 months with Azalee CourseHao Meng, PA Follow up in 6 months with Dr. Royann Shiversroitoru    Signed, Thurmon FairMihai Myrna Vonseggern, MD  05/29/2019 1:51 PM     [facility]

## 2019-05-27 NOTE — Patient Instructions (Signed)
Medication Instructions:  No changes *If you need a refill on your cardiac medications before your next appointment, please call your pharmacy*  Lab Work: None ordered If you have labs (blood work) drawn today and your tests are completely normal, you will receive your results only by: Marland Kitchen MyChart Message (if you have MyChart) OR . A paper copy in the mail If you have any lab test that is abnormal or we need to change your treatment, we will call you to review the results.  Testing/Procedures: None ordered  Follow-Up: At [facility], you and your health needs are our priority.  As part of our continuing mission to provide you with exceptional heart care, we have created designated Provider Care Teams.  These Care Teams include your primary Cardiologist (physician) and Advanced Practice Providers (APPs -  Physician Assistants and Nurse Practitioners) who all work together to provide you with the care you need, when you need it.  Your next appointment:   Follow up in 3 months with Almyra Deforest, PA Follow up in 6 months with Dr. Sallyanne Kuster

## 2019-05-29 ENCOUNTER — Encounter: Payer: Self-pay | Admitting: Cardiovascular Disease

## 2019-07-08 ENCOUNTER — Other Ambulatory Visit: Payer: Self-pay | Admitting: *Deleted

## 2019-07-08 MED ORDER — APIXABAN 5 MG PO TABS: 5.0000 mg | ORAL_TABLET | Freq: Two times a day (BID) | ORAL | 3 refills | Status: DC

## 2019-07-10 ENCOUNTER — Telehealth: Payer: Self-pay | Admitting: *Deleted

## 2019-07-10 NOTE — Telephone Encounter (Signed)
Eliquis assistance papers have been successfully faxed.

## 2019-07-30 NOTE — Telephone Encounter (Signed)
Assistance has been approved from 07/23/2019-07/09/2020

## 2019-08-26 ENCOUNTER — Telehealth: Payer: Self-pay | Admitting: *Deleted

## 2019-08-26 NOTE — Telephone Encounter (Signed)
A message was left, re: his follow up visit. 

## 2019-08-27 ENCOUNTER — Emergency Department (HOSPITAL_COMMUNITY): Payer: Medicare Other

## 2019-08-27 ENCOUNTER — Encounter (HOSPITAL_COMMUNITY): Payer: Self-pay

## 2019-08-27 ENCOUNTER — Emergency Department (HOSPITAL_COMMUNITY)
Admission: EM | Admit: 2019-08-27 | Discharge: 2019-08-27 | Disposition: A | Discharge status: Home / Self Care | Payer: Medicare Other | Attending: Emergency Medicine | Admitting: Emergency Medicine

## 2019-08-27 ENCOUNTER — Other Ambulatory Visit: Payer: Self-pay

## 2019-08-27 DIAGNOSIS — Z79899 Other long term (current) drug therapy: Secondary | ICD-10-CM | POA: Diagnosis not present

## 2019-08-27 DIAGNOSIS — Z7901 Long term (current) use of anticoagulants: Secondary | ICD-10-CM | POA: Insufficient documentation

## 2019-08-27 DIAGNOSIS — I4891 Unspecified atrial fibrillation: Secondary | ICD-10-CM | POA: Diagnosis not present

## 2019-08-27 DIAGNOSIS — F17210 Nicotine dependence, cigarettes, uncomplicated: Secondary | ICD-10-CM | POA: Insufficient documentation

## 2019-08-27 DIAGNOSIS — J189 Pneumonia, unspecified organism: Secondary | ICD-10-CM | POA: Insufficient documentation

## 2019-08-27 DIAGNOSIS — I10 Essential (primary) hypertension: Secondary | ICD-10-CM | POA: Diagnosis not present

## 2019-08-27 DIAGNOSIS — Z20822 Contact with and (suspected) exposure to covid-19: Secondary | ICD-10-CM | POA: Insufficient documentation

## 2019-08-27 DIAGNOSIS — R0789 Other chest pain: Secondary | ICD-10-CM | POA: Diagnosis present

## 2019-08-27 DIAGNOSIS — R079 Chest pain, unspecified: Secondary | ICD-10-CM

## 2019-08-27 LAB — CBC
HCT: 46.6 % (ref 39.0–52.0)
Hemoglobin: 15.6 g/dL (ref 13.0–17.0)
MCH: 32.6 pg (ref 26.0–34.0)
MCHC: 33.5 g/dL (ref 30.0–36.0)
MCV: 97.5 fL (ref 80.0–100.0)
Platelets: 266 10*3/uL (ref 150–400)
RBC: 4.78 MIL/uL (ref 4.22–5.81)
RDW: 13.4 % (ref 11.5–15.5)
WBC: 18 10*3/uL — ABNORMAL HIGH (ref 4.0–10.5)
nRBC: 0 % (ref 0.0–0.2)

## 2019-08-27 LAB — TROPONIN I (HIGH SENSITIVITY)
Troponin I (High Sensitivity): 5 ng/L (ref <18)
Troponin I (High Sensitivity): 8 ng/L (ref <18)

## 2019-08-27 LAB — BASIC METABOLIC PANEL
Anion gap: 14 (ref 5–15)
BUN: 12 mg/dL (ref 8–23)
CO2: 21 mmol/L — ABNORMAL LOW (ref 22–32)
Calcium: 8.6 mg/dL — ABNORMAL LOW (ref 8.9–10.3)
Chloride: 100 mmol/L (ref 98–111)
Creatinine, Ser: 0.82 mg/dL (ref 0.61–1.24)
GFR calc Af Amer: >60 mL/min (ref >60)
GFR calc non Af Amer: >60 mL/min (ref >60)
Glucose, Bld: 126 mg/dL — ABNORMAL HIGH (ref 70–99)
Potassium: 4.8 mmol/L (ref 3.5–5.1)
Sodium: 135 mmol/L (ref 135–145)

## 2019-08-27 LAB — SARS CORONAVIRUS 2 (TAT 6-24 HRS): SARS Coronavirus 2: NEGATIVE

## 2019-08-27 LAB — PROTIME-INR
INR: 1.4 — ABNORMAL HIGH (ref 0.8–1.2)
Prothrombin Time: 16.8 seconds — ABNORMAL HIGH (ref 11.4–15.2)

## 2019-08-27 LAB — MAGNESIUM: Magnesium: 1.7 mg/dL (ref 1.7–2.4)

## 2019-08-27 LAB — ETHANOL: Alcohol, Ethyl (B): 134 mg/dL — ABNORMAL HIGH (ref <10)

## 2019-08-27 MED ORDER — METOPROLOL TARTRATE 5 MG/5ML IV SOLN
5.0000 mg | Freq: Once | INTRAVENOUS | Status: AC
  Administered 2019-08-27: 5 mg via INTRAVENOUS
  Filled 2019-08-27: qty 5

## 2019-08-27 MED ORDER — METOPROLOL TARTRATE 25 MG PO TABS
100.0000 mg | ORAL_TABLET | Freq: Once | ORAL | Status: AC
  Administered 2019-08-27: 100 mg via ORAL
  Filled 2019-08-27: qty 4

## 2019-08-27 MED ORDER — AZITHROMYCIN 250 MG PO TABS: 250.0000 mg | ORAL_TABLET | Freq: Every day | ORAL | 0 refills | Status: DC

## 2019-08-27 NOTE — ED Notes (Signed)
Pt placed on 3L [place] for comfort.

## 2019-08-27 NOTE — ED Provider Notes (Signed)
Care assumed from [facility], PA-C at shift change. See her note for full HPI.  In short, patient is a 64 year old male with a medical history significant for HTN, dyslipidemia, and persistent A. Fib who presents to the ED due to shortness of breath x 5 days. He had a similar episode 3 weeks ago that lasted roughly 10 days. Shortness of breath is associated with left-sided chest pressure. Patient had been compliant with his daily medication as well as Eliquis. Of note, his metoprolol was halved when he last picked up his prescription.   Plan: F/u on troponin. Suspect chest discomfort due to increased demand. Continue to monitor vitals. If patient is rate controlled and feels better, patient can be discharged home with cardiology follow-up. If his heart rate remains elevated and he continues to be symptomatic, admission for observation.   Physical Exam  BP 122/80   Pulse (!) 109   Temp 99.2 F (37.3 C) (Oral)   Resp (!) 21   Ht 6\' 1"  (1.854 m)   Wt 88.5 kg   SpO2 96%   BMI 25.73 kg/m   Physical Exam Vitals and nursing note reviewed.  Constitutional:      General: He is not in acute distress.    Appearance: He is not toxic-appearing.  HENT:     Head: Normocephalic.  Eyes:     Pupils: Pupils are equal, round, and reactive to light.  Cardiovascular:     Rate and Rhythm: Tachycardia present. Rhythm irregular.     Pulses: Normal pulses.     Heart sounds: Normal heart sounds. No murmur. No friction rub. No gallop.      Comments: A. Fib on monitor.  Pulmonary:     Effort: Pulmonary effort is normal.     Breath sounds: Normal breath sounds.     Comments: Decreased air movement in left base.  Abdominal:     General: Abdomen is flat. Bowel sounds are normal. There is no distension.     Palpations: Abdomen is soft.     Tenderness: There is no abdominal tenderness. There is no guarding or rebound.  Musculoskeletal:     Cervical back: Neck supple.     Comments: Able to move all 4  extremities without difficulty. No lower extremity edema.   Skin:    General: Skin is warm and dry.  Neurological:     General: No focal deficit present.     Mental Status: He is alert.  Psychiatric:        Mood and Affect: Mood normal.        Behavior: Behavior normal.     ED Course/Procedures   Clinical Course as of Aug 27 1119  Wed Aug 27, 2019  0518 Patient compliant with metoprolol, but was taking 100mg  BID and his most recent Rx was for 50mg  BID. Continues to take his Eliquis. From chart review, appears to chronically be in rate controlled Afib. Had TEE cardioversion in 01/2019 without sustained success. Not felt good candidate for ablation due to ETOH use.  Appears uncomfortable, diaphoretic. While rate is rapid, not severely so. Plan for 5mg  IV Lopressor with 100mg  oral metoprolol dose now to try and control rate. He was placed on 3L O2 via [place] for comfort. Sats never dropped below 94% on room air.   [KH]    Clinical Course User Index [KH] , PA-C    Procedures  MDM  Care assumed from [facility], PA-C at shift change. See her note  for full MDM.  63 year old male with a history of A. Fib with RVR presents to the ED for evaluation of shortness of breath and chest pressure. When patient arrived, patient was in A. Fib with HR from 105-122bpm. He appeared diaphoretic, but stable. Patient was placed on O2 for comfort, no hypoxia.   Patient given 3 doses of Lopressor (Oral 100mg  and IV 5mg  x 2). CBC significant for leukocytosis at 18, but appears to be chronic in nature. Previous provider had no concern for infectious etiology. BMP reassuring. CXR personally reviewed which demonstrates: 1. Retrocardiac left lower lobe opacity with pleural effusion. This  may represent pneumonia with parapneumonic effusion or pleural  effusion with compressive atelectasis.  2. Stable mild cardiomegaly.   Patient notes 3 weeks ago he felt feverish and had chills.He admits to an  intermittent chronic cough. Smoke 1.5 packs a day. Denies sick contacts and COVID exposures. Will add COVID test given CXR findings, but doesn't appear to be COVID-like pneumonia. Initial troponin 5. Will obtain delta given patient is still having mild chest pressure. During my evaluation HR has improved to 105-110.   2nd troponin normal. Low suspicion for ACS. Suspect shortness of breath related to combination of demand and possible pneumonia. Low suspicion for PE/DVT. Elevated Ethanol level. Patient clinically sober on my exam. HR has improved throughout his ED stay. Patient taken off O2 with no further complaints. Patient notes he is ready to go home which I find to be reasonable given his improvement in HR. Will discharge patient with treatment for community acquired pneumonia. COVID test pending.  Instructed patient to self quarantine until results become available.  Patient advised to call cardiologist today to inquire about metoprolol amount he should be taking daily.  Advised patient to follow-up with cardiologist within the next week for further evaluation.  Ambulatory A. fib clinic referral placed at discharge.  Patient advised to return to the ER for worsening shortness of breath, central chest pain, or worsening symptoms. Discussed case with Dr. Stark Jock who agrees with assessment and plan for discharge. Strict ED precautions discussed with patient. Patient states understanding and agrees to plan. Patient discharged home in no acute distress and stable vitals     Karie Kirks 08/27/19 1122    Veryl Speak, MD 08/28/19 [phone]

## 2019-08-27 NOTE — Discharge Instructions (Addendum)
As discussed, your heart rate slowed down as your were observed here in the ER. Your chest x-ray showed what could be pneumonia. Your COVID test is pending. Self quarantine until your results become available. I am sending you home with an antibiotic. Start the antibiotics today. Call your cardiologist when you leave here to ask how much metoprolol you should be taking twice a day. I suspect the change in the medication has caused your heart rate to elevated. Follow-up with cardiologist within the next week for further evaluation. Return to the ER if you develop worsening shortness of breath, central chest pain, or worsening symptoms.

## 2019-08-27 NOTE — ED Triage Notes (Signed)
Pt from home w CC of central chest pain, SHOB w exertion. Chest pain is not radiating, not reproducible. EKG showed Afib RVR 16 G in L AC, fluids en route EMS noted orthostatic changes en route, VSS otherwise

## 2019-08-27 NOTE — ED Notes (Signed)
Warm blanket provided, HOB lowered for comfort

## 2019-08-27 NOTE — ED Notes (Signed)
Patient verbalized understanding of dc instructions, vss, ambulatory with nad.   

## 2019-08-27 NOTE — ED Provider Notes (Signed)
[facility] EMERGENCY DEPARTMENT Provider Note   CSN: [id number] Arrival date & time: 08/27/19  0405     History Chief Complaint  Patient presents with  . Chest Pain  . Shortness of Breath    Patrick Padilla is a 64 y.o. male.  64 year old male with a history of hypertension, dyslipidemia, persistent atrial fibrillation presents to the ED for complaints of shortness of breath.  States that he had onset of shortness of breath episode 3 weeks ago which lasted approximately 10 days.  His symptoms spontaneously subsided, but recurred 5 days ago and have continued to remain constant.  He reports inability to sleep secondary to his shortness of breath.  Also noting associated left-sided chest pressure.  Has had some discomfort in his left shoulder as well, though he initially attributed this to musculoskeletal pain.  Feels sweaty and lightheaded at times.  Had one episode of vomiting when his shortness of breath began, but no nausea or vomiting since.  He has been compliant with his daily medications as well as his chronic Eliquis.  Does state that his most recent prescription for metoprolol appeared to have been prescribed at half the dose he was used to taking.  Continues to drink alcohol daily; usually 3-4 liquor drinks per night.  Denies any drug use as well as any fevers, cough, leg swelling, hemoptysis, syncope.  CHA2DS2-Vasc score is 0-1 Cardiologist - Dr. Sallyanne Kuster  The history is provided by the patient. No language interpreter was used.  Chest Pain Associated symptoms: shortness of breath   Shortness of Breath Associated symptoms: chest pain        Past Medical History:  Diagnosis Date  . Anxiety   . Elevated LFTs   . History of colon polyps   . HLD (hyperlipidemia)   . HTN (hypertension)   . Localized superficial swelling, mass, or lump     Patient Active Problem List   Diagnosis Date Noted  . Other persistent atrial fibrillation ([place])   . Atrial  fibrillation with rapid ventricular response ([place]) 02/03/2019  . Hyperlipidemia 02/03/2019  . Anxiety 02/03/2019    Past Surgical History:  Procedure Laterality Date  . CARDIOVERSION N/A 02/05/2019   Procedure: CARDIOVERSION;  Surgeon: Josue Hector, MD;  Location: [facility] ENDOSCOPY;  Service: Cardiovascular;  Laterality: N/A;  . CERVICAL DISC SURGERY    . LEG SURGERY     right leg and ankle  . NECK SURGERY     cyst removed  . TEE WITHOUT CARDIOVERSION N/A 02/05/2019   Procedure: TRANSESOPHAGEAL ECHOCARDIOGRAM (TEE);  Surgeon: Josue Hector, MD;  Location: [facility] ENDOSCOPY;  Service: Cardiovascular;  Laterality: N/A;  . TOTAL HIP ARTHROPLASTY     left       Family History  Problem Relation Age of Onset  . Hepatitis Mother   . Clotting disorder Mother   . Stroke Mother   . Thyroid disease Mother   . Colon cancer Father        ?  Marland Kitchen Rheumatic fever Brother   . Heart disease Brother     Social History   Tobacco Use  . Smoking status: Current Every Day Smoker  . Smokeless tobacco: Never Used  Substance Use Topics  . Alcohol use: Yes    Comment: 3-4 a day  . Drug use: No    Home Medications Prior to Admission medications   Medication Sig Start Date End Date Taking? Authorizing Provider  apixaban (ELIQUIS) 5 MG TABS tablet Take 1 tablet (5  mg total) by mouth 2 (two) times daily. 07/08/19  Yes Croitoru, Mihai, MD  metoprolol tartrate (LOPRESSOR) 50 MG tablet Take 1 tablet (50 mg total) by mouth 2 (two) times daily. 02/05/19  Yes Barrett, Joline Salt, PA-C  Multiple Vitamin (MULTIVITAMIN) tablet Take 1 tablet by mouth daily.   Yes [provider]  PARoxetine (PAXIL) 20 MG tablet Take 20 mg by mouth daily.   Yes [provider]    Allergies    Patient has no known allergies.  Review of Systems   Review of Systems  Respiratory: Positive for shortness of breath.   Cardiovascular: Positive for chest pain.  Ten systems reviewed and are negative for acute change,  except as noted in the HPI.    Physical Exam Updated Vital Signs BP 110/82   Pulse (!) 110   Temp 99.2 F (37.3 C) (Oral)   Resp (!) 25   Ht 6\' 1"  (1.854 m)   Wt 88.5 kg   SpO2 99%   BMI 25.73 kg/m   Physical Exam Vitals and nursing note reviewed.  Constitutional:      General: He is not in acute distress.    Appearance: He is well-developed.     Comments: Appears uncomfortable. Mildly diaphoretic.  HENT:     Head: Normocephalic and atraumatic.  Eyes:     General: No scleral icterus.    Conjunctiva/sclera: Conjunctivae normal.  Cardiovascular:     Rate and Rhythm: Tachycardia present. Rhythm irregularly irregular.     Comments: Rate 106-122bpm. Afib on monitor. Pulmonary:     Effort: Pulmonary effort is normal. No respiratory distress.     Breath sounds: No wheezing.     Comments: Decreased in the L lung base. Otherwise grossly clear. Musculoskeletal:        General: Normal range of motion.     Cervical back: Normal range of motion.     Comments: No BLE edema.  Skin:    General: Skin is warm and dry.     Coloration: Skin is not pale.     Findings: No erythema or rash.  Neurological:     Mental Status: He is alert and oriented to person, place, and time.     Coordination: Coordination normal.  Psychiatric:        Behavior: Behavior normal.     ED Results / Procedures / Treatments   Labs (all labs ordered are listed, but only abnormal results are displayed) Labs Reviewed  BASIC METABOLIC PANEL - Abnormal; Notable for the following components:      Result Value   CO2 21 (*)    Glucose, Bld 126 (*)    Calcium 8.6 (*)    All other components within normal limits  CBC - Abnormal; Notable for the following components:   WBC 18.0 (*)    All other components within normal limits  PROTIME-INR - Abnormal; Notable for the following components:   Prothrombin Time 16.8 (*)    INR 1.4 (*)    All other components within normal limits  MAGNESIUM  ETHANOL  RAPID  URINE DRUG SCREEN, HOSP PERFORMED  TROPONIN I (HIGH SENSITIVITY)    EKG EKG Interpretation  Date/Time:  Wednesday August 27 2019 04:12:37 EST Ventricular Rate:  107 PR Interval:    QRS Duration: 88 QT Interval:  317 QTC Calculation: 419 R Axis:   73 Text Interpretation: Atrial fibrillation Anteroseptal infarct, age indeterminate Confirmed by [id number] (Ross Marcus) on 08/27/2019 4:25:43 AM   Radiology DG Chest 2  View  Result Date: 08/27/2019 CLINICAL DATA:  Chest pain and shortness of breath. Atrial fibrillation EKG. EXAM: CHEST - 2 VIEW COMPARISON:  02/03/2019 FINDINGS: Retrocardiac opacity with left pleural effusion. Mild cardiomegaly is similar to prior with unchanged mediastinal contours. No pulmonary edema or pneumothorax. Surgical hardware in the lower cervical spine is partially included. IMPRESSION: 1. Retrocardiac left lower lobe opacity with pleural effusion. This may represent pneumonia with parapneumonic effusion or pleural effusion with compressive atelectasis. 2. Stable mild cardiomegaly. Electronically Signed   By: Narda Rutherford M.D.   On: 08/27/2019 04:46    Procedures Procedures (including critical care time)  Medications Ordered in ED Medications  metoprolol tartrate (LOPRESSOR) injection 5 mg (5 mg Intravenous Given 08/27/19 0519)  metoprolol tartrate (LOPRESSOR) tablet 100 mg (100 mg Oral Given 08/27/19 0522)  metoprolol tartrate (LOPRESSOR) injection 5 mg (5 mg Intravenous Given 08/27/19 0631)    ED Course  I have reviewed the triage vital signs and the nursing notes.  Pertinent labs & imaging results that were available during my care of the patient were reviewed by me and considered in my medical decision making (see chart for details).  Clinical Course as of Aug 26 648  Wed Aug 27, 2019  0518 Patient compliant with metoprolol, but was taking 100mg  BID and his most recent Rx was for 50mg  BID. Continues to take his Eliquis. From chart review, appears  to chronically be in rate controlled Afib. Had TEE cardioversion in 01/2019 without sustained success. Not felt good candidate for ablation due to ETOH use.  Appears uncomfortable, diaphoretic. While rate is rapid, not severely so. Plan for 5mg  IV Lopressor with 100mg  oral metoprolol dose now to try and control rate. He was placed on 3L O2 via [place] for comfort. Sats never dropped below 94% on room air.   [KH]    Clinical Course User Index [KH] , PA-C   MDM Rules/Calculators/A&P                      64 year old male with a history of A. fib with RVR presents to the emergency department complaining of shortness of breath with mild chest pressure.  Noted to have atrial fibrillation on monitor with rate from 105-122bpm.  Mildly diaphoretic, but stable.  No hypoxia, placed on O2 for comfort.  Rate control being attempted with IV and oral metoprolol. Labs generally reassuring.  While he has a leukocytosis, this appears chronic x 6 months and at baseline.  He denies infectious symptoms such as fever and cough.  Chest x-ray findings favored to represent pleural effusion with atelectasis rather than opacity.  Patient signed out to [place], PA-C at change of shift pending reassessment.  Patient may require admission if symptoms unable to be controlled.   Final Clinical Impression(s) / ED Diagnoses Final diagnoses:  Atrial fibrillation with RVR [facility])    Rx / DC Orders ED Discharge Orders         Ordered    Amb referral to AFIB Clinic     08/27/19 0423           64, PA-C 08/27/19 [facility]    08/29/19, MD 08/27/19 [phone]

## 2019-08-27 NOTE — ED Notes (Signed)
ED Provider at bedside. 

## 2019-08-27 NOTE — ED Notes (Signed)
Patient trialled off of supplemental O2, remains 96-98% on room air.

## 2019-09-16 ENCOUNTER — Other Ambulatory Visit: Payer: Self-pay | Admitting: Physician Assistant

## 2019-09-17 ENCOUNTER — Other Ambulatory Visit: Payer: Self-pay | Admitting: Physician Assistant

## 2019-09-17 ENCOUNTER — Ambulatory Visit (INDEPENDENT_AMBULATORY_CARE_PROVIDER_SITE_OTHER): Payer: Medicare Other | Admitting: Physician Assistant

## 2019-09-17 ENCOUNTER — Other Ambulatory Visit: Payer: Self-pay

## 2019-09-17 ENCOUNTER — Encounter: Payer: Self-pay | Admitting: Physician Assistant

## 2019-09-17 VITALS — BP 126/74 | HR 93 | Temp 97.3°F | Ht 73.0 in | Wt 196.6 lb

## 2019-09-17 DIAGNOSIS — F1010 Alcohol abuse, uncomplicated: Secondary | ICD-10-CM | POA: Diagnosis not present

## 2019-09-17 DIAGNOSIS — I4819 Other persistent atrial fibrillation: Secondary | ICD-10-CM

## 2019-09-17 DIAGNOSIS — E7800 Pure hypercholesterolemia, unspecified: Secondary | ICD-10-CM

## 2019-09-17 MED ORDER — METOPROLOL TARTRATE 100 MG PO TABS: 100.0000 mg | ORAL_TABLET | Freq: Two times a day (BID) | ORAL | 1 refills | Status: DC

## 2019-09-17 NOTE — Progress Notes (Signed)
Cardiology Office Note:    Date:  09/19/2019   ID:  Patrick Padilla, DOB 09-22-1955, MRN [id number]  PCP:  Patient, No Pcp Per  Cardiologist:  Sanda Klein, MD  Electrophysiologist:  None   Referring MD: No ref. provider found   Chief Complaint  Patient presents with  . Follow-up    seen for Dr. Sallyanne Kuster    History of Present Illness:    Patrick Padilla is a 64 y.o. male with a hx of alcohol abuse, tobacco abuse, hyperlipidemia, and persistent atrial fibrillation. He was recently admitted to the hospital in 01/2019 with chest pain and atrial fibrillation with RVR. Chest pain resolved with a rate control. Cardiac enzyme was negative.Patient was placed on IV diltiazem and later transitioned to oral beta-blocker. His CHA2DS2-Vasc score is (0-1),therefore long-term anticoagulation is not required. A successfulTEE cardioversion was performed on 7/29, the plan is for him to continue Eliquis for 30 days, then transition to aspirin 81 mg after that. TEE showed mild LAE. Echocardiogram obtained on 01/27/2019 shows EF of 55 to 60%, no significant valve disease.   When I saw the patient back in August 2020, he has went back into atrial fibrillation.  I discussed with the patient about antiarrhythmic therapy at the time, however he was going through a divorce and wished to remain on rate control therapy.  He was also drinking excessively.  More recently, patient presented to the ED with chest discomfort.  Heart rate was not very well controlled at 109.  Serial troponin negative.  Patient was instructed to follow-up with A. fib clinic, this apparently did not happen.  His white blood cell count was 18.0.  EtOH level elevated 134.  Chest x-ray revealed a left lower lobe opacity with pleural effusion, this read may represent pneumonia with parapneumonic effusion versus pleural effusion with atelectasis.  His metoprolol was increased to 100 mg twice daily and he was given antibiotic for possible  pneumonia.  Patient presents today for cardiology office visit.  He has been taking 2 tablets of metoprolol every morning and every night.  He requires a refill of his medication.  I have consolidated the metoprolol dosage to 100 mg twice daily.  He says his breathing is much better.  Heart rate is well controlled in the 70s.  He denies any further chest discomfort.  On exam he does not have any lower extremity edema and his lungs is clear.  He can follow-up with Dr. Sallyanne Kuster in 3 to 4 months.   Past Medical History:  Diagnosis Date  . Anxiety   . Elevated LFTs   . History of colon polyps   . HLD (hyperlipidemia)   . HTN (hypertension)   . Localized superficial swelling, mass, or lump     Past Surgical History:  Procedure Laterality Date  . CARDIOVERSION N/A 02/05/2019   Procedure: CARDIOVERSION;  Surgeon: Josue Hector, MD;  Location: [facility] ENDOSCOPY;  Service: Cardiovascular;  Laterality: N/A;  . CERVICAL DISC SURGERY    . LEG SURGERY     right leg and ankle  . NECK SURGERY     cyst removed  . TEE WITHOUT CARDIOVERSION N/A 02/05/2019   Procedure: TRANSESOPHAGEAL ECHOCARDIOGRAM (TEE);  Surgeon: Josue Hector, MD;  Location: [facility] ENDOSCOPY;  Service: Cardiovascular;  Laterality: N/A;  . TOTAL HIP ARTHROPLASTY     left    Current Medications: Current Meds  Medication Sig  . apixaban (ELIQUIS) 5 MG TABS tablet Take 1 tablet (5 mg total) by mouth 2 (  two) times daily.  . metoprolol tartrate (LOPRESSOR) 100 MG tablet Take 1 tablet (100 mg total) by mouth 2 (two) times daily.  . Multiple Vitamin (MULTIVITAMIN) tablet Take 1 tablet by mouth daily.  Marland Kitchen PARoxetine (PAXIL) 20 MG tablet Take 20 mg by mouth daily.  . [DISCONTINUED] metoprolol tartrate (LOPRESSOR) 50 MG tablet Take 1 tablet (50 mg total) by mouth 2 (two) times daily.     Allergies:   Patient has no known allergies.   Social History   Socioeconomic History  . Marital status: Married    Spouse name: Not on file  . Number  of children: 2  . Years of education: Not on file  . Highest education level: Not on file  Occupational History  . Occupation: disabled    Employer: SELF-EMPLOYED  Tobacco Use  . Smoking status: Current Every Day Smoker  . Smokeless tobacco: Never Used  Substance and Sexual Activity  . Alcohol use: Yes    Comment: 3-4 a day  . Drug use: No  . Sexual activity: Not on file  Other Topics Concern  . Not on file  Social History Narrative  . Not on file   Social Determinants of Health   Financial Resource Strain:   . Difficulty of Paying Living Expenses:   Food Insecurity:   . Worried About Programme researcher, broadcasting/film/video in the Last Year:   . Barista in the Last Year:   Transportation Needs:   . Freight forwarder (Medical):   Marland Kitchen Lack of Transportation (Non-Medical):   Physical Activity:   . Days of Exercise per Week:   . Minutes of Exercise per Session:   Stress:   . Feeling of Stress :   Social Connections:   . Frequency of Communication with Friends and Family:   . Frequency of Social Gatherings with Friends and Family:   . Attends Religious Services:   . Active Member of Clubs or Organizations:   . Attends Banker Meetings:   Marland Kitchen Marital Status:      Family History: The patient's family history includes Clotting disorder in his mother; Colon cancer in his father; Heart disease in his brother; Hepatitis in his mother; Rheumatic fever in his brother; Stroke in his mother; Thyroid disease in his mother.  ROS:   Please see the history of present illness.     All other systems reviewed and are negative.  EKGs/Labs/Other Studies Reviewed:    The following studies were reviewed today:  Echo 02/05/2019 1. The left ventricle has normal systolic function, with an ejection  fraction of 55-60%. The cavity size was normal. Left ventricular diastolic  function could not be evaluated.  2. The right ventricle has normal systolc function. The cavity was  normal.  There is no increase in right ventricular wall thickness.  3. Left atrial size was mildly dilated.  4. Aneurysmal and mobile atrial septum but no obvious PFO by color flow  or 2D imaging Bubble study not performed.  5. The aortic valve is tricuspid.  6. The aortic root is normal in size and structure.  7. 3D imaging of the atrial septum performed.  8. DCC: x 2 150 J then 200J biphasic. Patient converted from afib rate 98  to NSR rate 74 beats / min On Eliquis No immediate neurologic sequleae.   EKG:  EKG is ordered today.  The ekg ordered today demonstrates atrial fibrillation, heart rate 77.  Recent Labs: 02/03/2019: B Natriuretic Peptide 103.1 02/05/2019:  TSH 4.058 08/27/2019: BUN 12; Creatinine, Ser 0.82; Hemoglobin 15.6; Magnesium 1.7; Platelets 266; Potassium 4.8; Sodium 135  Recent Lipid Panel    Component Value Date/Time   CHOL 135 02/04/2019 0316   TRIG 47 02/04/2019 0316   HDL 78 02/04/2019 0316   CHOLHDL 1.7 02/04/2019 0316   VLDL 9 02/04/2019 0316   LDLCALC 48 02/04/2019 0316    Physical Exam:    VS:  BP 126/74   Pulse 93   Temp (!) 97.3 F (36.3 C)   Ht 6\' 1"  (1.854 m)   Wt 196 lb 9.6 oz (89.2 kg)   SpO2 96%   BMI 25.94 kg/m     Wt Readings from Last 3 Encounters:  09/17/19 196 lb 9.6 oz (89.2 kg)  08/27/19 195 lb (88.5 kg)  05/27/19 195 lb (88.5 kg)     GEN:  Well nourished, well developed in no acute distress HEENT: Normal NECK: No JVD; No carotid bruits LYMPHATICS: No lymphadenopathy CARDIAC: Irregularly irregular, no murmurs, rubs, gallops RESPIRATORY:  Clear to auscultation without rales, wheezing or rhonchi  ABDOMEN: Soft, non-tender, non-distended MUSCULOSKELETAL:  No edema; No deformity  SKIN: Warm and dry NEUROLOGIC:  Alert and oriented x 3 PSYCHIATRIC:  Normal affect   ASSESSMENT:    1. Persistent atrial fibrillation (HCC)   2. Pure hypercholesterolemia   3. Alcohol abuse    PLAN:    In order of problems listed  above:  1. Persistent atrial fibrillation: Continue Eliquis and metoprolol.  Consolidate metoprolol to 100 mg twice daily dosing.  Heart rate is well controlled on the current therapy.  Unfortunately he continues to drink alcohol on a daily basis.  I did encourage him to cut back.  He is aware of the correlation between alcohol use and persistent atrial fibrillation.  Cardioverting him at this point likely will not offer much benefit because of high likelihood of recurrence.  2. Hyperlipidemia: Diet and exercise  3. Alcohol abuse: Cessation recommended due to correlation with atrial fibrillation   Medication Adjustments/Labs and Tests Ordered: Current medicines are reviewed at length with the patient today.  Concerns regarding medicines are outlined above.  Orders Placed This Encounter  Procedures  . EKG 12-Lead   Meds ordered this encounter  Medications  . metoprolol tartrate (LOPRESSOR) 100 MG tablet    Sig: Take 1 tablet (100 mg total) by mouth 2 (two) times daily.    Dispense:  180 tablet    Refill:  1    New Rx cancel the old 50 mg BID    Patient Instructions  Medication Instructions:   INCREASE Metoprolol Tartrate to 100 mg 2 times a day\  *If you need a refill on your cardiac medications before your next appointment, please call your pharmacy*  Lab Work: NONE ordered at this time of appointment   If you have labs (blood work) drawn today and your tests are completely normal, you will receive your results only by: 05/29/19 MyChart Message (if you have MyChart) OR . A paper copy in the mail If you have any lab test that is abnormal or we need to change your treatment, we will call you to review the results.  Testing/Procedures: NONE ordered at this time of appointment   Follow-Up: At [facility], you and your health needs are our priority.  As part of our continuing mission to provide you with exceptional heart care, we have created designated Provider Care Teams.  These  Care Teams include your primary Cardiologist (physician) and  Advanced Practice Providers (APPs -  Physician Assistants and Nurse Practitioners) who all work together to provide you with the care you need, when you need it.  We recommend signing up for the patient portal called "MyChart".  Sign up information is provided on this After Visit Summary.  MyChart is used to connect with patients for Virtual Visits (Telemedicine).  Patients are able to view lab/test results, encounter notes, upcoming appointments, etc.  Non-urgent messages can be sent to your provider as well.   To learn more about what you can do with MyChart, go to [email or web address].    Your next appointment:   3-4 month(s)  The format for your next appointment:   In Person  Provider:   Thurmon Fair, MD  Other Instructions      Signed, Azalee Course, [place]  09/19/2019 11:56 PM    [facility]

## 2019-09-17 NOTE — Patient Instructions (Signed)
Medication Instructions:   INCREASE Metoprolol Tartrate to 100 mg 2 times a day\  *If you need a refill on your cardiac medications before your next appointment, please call your pharmacy*  Lab Work: NONE ordered at this time of appointment   If you have labs (blood work) drawn today and your tests are completely normal, you will receive your results only by: Marland Kitchen MyChart Message (if you have MyChart) OR . A paper copy in the mail If you have any lab test that is abnormal or we need to change your treatment, we will call you to review the results.  Testing/Procedures: NONE ordered at this time of appointment   Follow-Up: At [facility], you and your health needs are our priority.  As part of our continuing mission to provide you with exceptional heart care, we have created designated Provider Care Teams.  These Care Teams include your primary Cardiologist (physician) and Advanced Practice Providers (APPs -  Physician Assistants and Nurse Practitioners) who all work together to provide you with the care you need, when you need it.  We recommend signing up for the patient portal called "MyChart".  Sign up information is provided on this After Visit Summary.  MyChart is used to connect with patients for Virtual Visits (Telemedicine).  Patients are able to view lab/test results, encounter notes, upcoming appointments, etc.  Non-urgent messages can be sent to your provider as well.   To learn more about what you can do with MyChart, go to [email or web address].    Your next appointment:   3-4 month(s)  The format for your next appointment:   In Person  Provider:   Thurmon Fair, MD  Other Instructions

## 2019-09-19 ENCOUNTER — Encounter: Payer: Self-pay | Admitting: Physician Assistant

## 2019-12-24 ENCOUNTER — Ambulatory Visit (INDEPENDENT_AMBULATORY_CARE_PROVIDER_SITE_OTHER): Payer: Medicare Other | Admitting: Cardiovascular Disease

## 2019-12-24 ENCOUNTER — Other Ambulatory Visit: Payer: Self-pay

## 2019-12-24 VITALS — BP 140/80 | HR 80 | Ht 72.0 in | Wt 202.0 lb

## 2019-12-24 DIAGNOSIS — I4811 Longstanding persistent atrial fibrillation: Secondary | ICD-10-CM

## 2019-12-24 DIAGNOSIS — I10 Essential (primary) hypertension: Secondary | ICD-10-CM | POA: Diagnosis not present

## 2019-12-24 DIAGNOSIS — L02413 Cutaneous abscess of right upper limb: Secondary | ICD-10-CM | POA: Diagnosis not present

## 2019-12-24 MED ORDER — CIPROFLOXACIN HCL 500 MG PO TABS: 500.0000 mg | ORAL_TABLET | Freq: Two times a day (BID) | ORAL | 0 refills | Status: DC

## 2019-12-24 NOTE — Progress Notes (Signed)
Cardiology Office Note:    Date:  12/26/2019   ID:  Patrick Padilla, DOB [birth year], MRN [id number]  PCP:  Patient, No Pcp Per  Cardiologist:  Thurmon Fair, MD  Electrophysiologist:  None   Referring MD: No ref. provider found   Chief Complaint  Patient presents with  . Atrial Fibrillation    History of Present Illness:    Patrick Padilla is a 64 y.o. male with a hx of alcohol and tobacco abuse, hyperlipidemia (questionable) and persistent atrial fibrillation.  He was hospitalized in July 2020 with chest pain in the setting of atrial fibrillation with rapid ventricular response and underwent TEE cardioversion on July 29.  Echo showed normal left ventricular systolic function and no valvular abnormalities.  Not long after he returned to persistent atrial fibrillation.  He was seen in the emergency room in February 2021 with shortness of breath and some left-sided chest pressure.  Work-up was benign including negative troponins.Marland Kitchen  He improved quickly after administration of metoprolol.  He presents today completely oblivious to the arrhythmia.  He is compliant with the metoprolol and since then has not had any cardiac complaints.  He also reports compliance with the Eliquis and has not had any falls, injuries or serious bleeding problems.  The patient specifically denies any chest pain at rest exertion, dyspnea at rest or with exertion, orthopnea, paroxysmal nocturnal dyspnea, syncope, palpitations, focal neurological deficits, intermittent claudication, lower extremity edema, unexplained weight gain, cough, hemoptysis or wheezing.  He still drinks alcohol, although "not as much".  More than a week ago he had a cat bite on his forearm and developed a forearm abscess.  This is actively draining.  He says that the area is not as red or as firm as it was a few days ago and appears to be improving, but the drainage is continuing any thinks he needs antibiotics.  He does not have painful axillary  lymphadenopathy and does not have fever or chills.   Past Medical History:  Diagnosis Date  . Anxiety   . Elevated LFTs   . History of colon polyps   . HLD (hyperlipidemia)   . HTN (hypertension)   . Localized superficial swelling, mass, or lump     Past Surgical History:  Procedure Laterality Date  . CARDIOVERSION N/A 02/05/2019   Procedure: CARDIOVERSION;  Surgeon: Wendall Stade, MD;  Location: [facility] ENDOSCOPY;  Service: Cardiovascular;  Laterality: N/A;  . CERVICAL DISC SURGERY    . LEG SURGERY     right leg and ankle  . NECK SURGERY     cyst removed  . TEE WITHOUT CARDIOVERSION N/A 02/05/2019   Procedure: TRANSESOPHAGEAL ECHOCARDIOGRAM (TEE);  Surgeon: Wendall Stade, MD;  Location: [facility] ENDOSCOPY;  Service: Cardiovascular;  Laterality: N/A;  . TOTAL HIP ARTHROPLASTY     left    Current Medications: Current Meds  Medication Sig  . apixaban (ELIQUIS) 5 MG TABS tablet Take 1 tablet (5 mg total) by mouth 2 (two) times daily.  . metoprolol tartrate (LOPRESSOR) 100 MG tablet Take 1 tablet (100 mg total) by mouth 2 (two) times daily.  . Multiple Vitamin (MULTIVITAMIN) tablet Take 1 tablet by mouth daily.  . Naproxen Sodium (ALEVE PO) Take 1 tablet by mouth. As needed  . PARoxetine (PAXIL) 20 MG tablet Take 20 mg by mouth daily.     Allergies:   Patient has no known allergies.   Social History   Socioeconomic History  . Marital status: Married    Spouse  name: Not on file  . Number of children: 2  . Years of education: Not on file  . Highest education level: Not on file  Occupational History  . Occupation: disabled    Employer: SELF-EMPLOYED  Tobacco Use  . Smoking status: Current Every Day Smoker  . Smokeless tobacco: Never Used  Substance and Sexual Activity  . Alcohol use: Yes    Comment: 3-4 a day  . Drug use: No  . Sexual activity: Not on file  Other Topics Concern  . Not on file  Social History Narrative  . Not on file   Social Determinants of Health    Financial Resource Strain:   . Difficulty of Paying Living Expenses:   Food Insecurity:   . Worried About Programme researcher, broadcasting/film/video in the Last Year:   . Barista in the Last Year:   Transportation Needs:   . Freight forwarder (Medical):   Marland Kitchen Lack of Transportation (Non-Medical):   Physical Activity:   . Days of Exercise per Week:   . Minutes of Exercise per Session:   Stress:   . Feeling of Stress :   Social Connections:   . Frequency of Communication with Friends and Family:   . Frequency of Social Gatherings with Friends and Family:   . Attends Religious Services:   . Active Member of Clubs or Organizations:   . Attends Banker Meetings:   Marland Kitchen Marital Status:      Family History: The patient's family history includes Clotting disorder in his mother; Colon cancer in his father; Heart disease in his brother; Hepatitis in his mother; Rheumatic fever in his brother; Stroke in his mother; Thyroid disease in his mother.  ROS:   Please see the history of present illness.     All other systems reviewed and are negative.  EKGs/Labs/Other Studies Reviewed:    The following studies were reviewed today:  EKG:  EKG is  ordered today.  It shows atrial fibrillation with minor nonspecific ST-T changes, QTC 440 ms.  Recent Labs: 02/03/2019: B Natriuretic Peptide 103.1 02/05/2019: TSH 4.058 08/27/2019: BUN 12; Creatinine, Ser 0.82; Hemoglobin 15.6; Magnesium 1.7; Platelets 266; Potassium 4.8; Sodium 135  Recent Lipid Panel    Component Value Date/Time   CHOL 135 02/04/2019 0316   TRIG 47 02/04/2019 0316   HDL 78 02/04/2019 0316   CHOLHDL 1.7 02/04/2019 0316   VLDL 9 02/04/2019 0316   LDLCALC 48 02/04/2019 0316    Physical Exam:    VS:  BP 140/80 (BP Location: Right Arm, Patient Position: Sitting, Cuff Size: Normal)   Pulse 80   Ht 6' (1.829 m)   Wt 202 lb (91.6 kg)   BMI 27.40 kg/m     Wt Readings from Last 3 Encounters:  12/24/19 202 lb (91.6 kg)   09/17/19 196 lb 9.6 oz (89.2 kg)  08/27/19 195 lb (88.5 kg)      General: Alert, oriented x3, no distress, appears comfortable Head: no evidence of trauma, PERRL, EOMI, no exophtalmos or lid lag, no myxedema, no xanthelasma; normal ears, nose and oropharynx Neck: normal jugular venous pulsations and no hepatojugular reflux; brisk carotid pulses without delay and no carotid bruits Chest: clear to auscultation, no signs of consolidation by percussion or palpation, normal fremitus, symmetrical and full respiratory excursions Cardiovascular: normal position and quality of the apical impulse, regular rhythm, normal first and second heart sounds, no murmurs, rubs or gallops Abdomen: no tenderness or distention, no masses  by palpation, no abnormal pulsatility or arterial bruits, normal bowel sounds, no hepatosplenomegaly Extremities: no clubbing, cyanosis or edema; 2+ radial, ulnar and brachial pulses bilaterally; 2+ right femoral, posterior tibial and dorsalis pedis pulses; 2+ left femoral, posterior tibial and dorsalis pedis pulses; no subclavian or femoral bruits On the volar aspect of his right forearm he has a roughly 1 cm ruptured abscess that is draining seropurulent fluid.  Surrounding it there is a dusky purple poorly defined area of inflammation that is roughly 6-7 cm in diameter.  It is not particularly tender or warm to touch.  There is no epitrochlear or axillary lymphadenopathy.  He does not appear toxic. Neurological: grossly nonfocal Psych: Normal mood and affect   ASSESSMENT:    1. Longstanding persistent atrial fibrillation (HCC)   2. Cutaneous abscess of right upper extremity    PLAN:    In order of problems listed above:  1. AFib: I believe the odds of return to normal rhythm are low and he does not want to take antiarrhythmics or undergo invasive procedures.  He is completely asymptomatic as long as he is rate controlled.  It is likely that his arrhythmia is much more  chronic than we had initially estimated.  He has preserved left ventricular systolic function.  Discussed the need to continue treatment with anticoagulation to avoid stroke (technically CHA2DS2-VASc 1 for HTN, but he is getting very close to the age of 42 and had rather not have a repeat discussion with him about the pros and cons of anticoagulation after interrupting it for a year).  Strongly recommended that he cut back on drinking. 2. HTN: Borderline elevated blood pressure.  Continue metoprolol.  Avoid excessive salt in food.  I think we need to keep his medical regimen uncomplicated to ensure compliance. 3. Abscess: It looks more like a staphylococcal abscess than true cat scratch disease, but will prescribe Cipro just in case.  Medication Adjustments/Labs and Tests Ordered: Current medicines are reviewed at length with the patient today.  Concerns regarding medicines are outlined above.  Orders Placed This Encounter  Procedures  . EKG 12-Lead   Meds ordered this encounter  Medications  . ciprofloxacin (CIPRO) 500 MG tablet    Sig: Take 1 tablet (500 mg total) by mouth 2 (two) times daily.    Dispense:  10 tablet    Refill:  0    Patient Instructions  Medication Instructions:  START Cipro 500 mg twice daily for 5 days  *If you need a refill on your cardiac medications before your next appointment, please call your pharmacy*   Lab Work: None ordered If you have labs (blood work) drawn today and your tests are completely normal, you will receive your results only by: Marland Kitchen MyChart Message (if you have MyChart) OR . A paper copy in the mail If you have any lab test that is abnormal or we need to change your treatment, we will call you to review the results.   Testing/Procedures: None ordered   Follow-Up: At [facility], you and your health needs are our priority.  As part of our continuing mission to provide you with exceptional heart care, we have created designated Provider  Care Teams.  These Care Teams include your primary Cardiologist (physician) and Advanced Practice Providers (APPs -  Physician Assistants and Nurse Practitioners) who all work together to provide you with the care you need, when you need it.  We recommend signing up for the patient portal called "MyChart".  Sign  up information is provided on this After Visit Summary.  MyChart is used to connect with patients for Virtual Visits (Telemedicine).  Patients are able to view lab/test results, encounter notes, upcoming appointments, etc.  Non-urgent messages can be sent to your provider as well.   To learn more about what you can do with MyChart, go to [email or web address].    Your next appointment:   12 month(s)  The format for your next appointment:   In Person  Provider:   You may see Sanda Klein, MD or one of the following Advanced Practice Providers on your designated Care Team:    Almyra Deforest, PA-C  Fabian Sharp, [place] or   Roby Lofts, PA-C        Signed, Sanda Klein, MD  12/26/2019 7:14 PM    [place]

## 2019-12-24 NOTE — Patient Instructions (Signed)
Medication Instructions:  START Cipro 500 mg twice daily for 5 days  *If you need a refill on your cardiac medications before your next appointment, please call your pharmacy*   Lab Work: None ordered If you have labs (blood work) drawn today and your tests are completely normal, you will receive your results only by: Marland Kitchen MyChart Message (if you have MyChart) OR . A paper copy in the mail If you have any lab test that is abnormal or we need to change your treatment, we will call you to review the results.   Testing/Procedures: None ordered   Follow-Up: At [facility], you and your health needs are our priority.  As part of our continuing mission to provide you with exceptional heart care, we have created designated Provider Care Teams.  These Care Teams include your primary Cardiologist (physician) and Advanced Practice Providers (APPs -  Physician Assistants and Nurse Practitioners) who all work together to provide you with the care you need, when you need it.  We recommend signing up for the patient portal called "MyChart".  Sign up information is provided on this After Visit Summary.  MyChart is used to connect with patients for Virtual Visits (Telemedicine).  Patients are able to view lab/test results, encounter notes, upcoming appointments, etc.  Non-urgent messages can be sent to your provider as well.   To learn more about what you can do with MyChart, go to [email or web address].    Your next appointment:   12 month(s)  The format for your next appointment:   In Person  Provider:   You may see Thurmon Fair, MD or one of the following Advanced Practice Providers on your designated Care Team:    Azalee Course, PA-C  Micah Flesher, PA-C or   Judy Pimple, [place]

## 2019-12-26 ENCOUNTER — Encounter: Payer: Self-pay | Admitting: Cardiovascular Disease

## 2019-12-26 DIAGNOSIS — I10 Essential (primary) hypertension: Secondary | ICD-10-CM | POA: Insufficient documentation

## 2020-04-27 ENCOUNTER — Other Ambulatory Visit: Payer: Self-pay | Admitting: Physician Assistant

## 2020-04-28 NOTE — Telephone Encounter (Signed)
Rx has been sent to the pharmacy electronically. ° °

## 2020-07-06 ENCOUNTER — Other Ambulatory Visit: Payer: Self-pay | Admitting: Cardiovascular Disease

## 2020-07-23 IMAGING — DX PORTABLE CHEST - 1 VIEW
1 series · 1 of 1 positions shown · non-contrast
Comparison: 10/19/2010

CLINICAL DATA: Pt here for central chest aching, especially when
taking a deep breath, and shortness of breath that began this
morning while sitting in his recliner. Denies sick contacts. No
cough. Pt states fever of 99.0 today. Pt is SOB during exam. Hx of
HTN.

EXAM:
PORTABLE CHEST - 1 VIEW

[chest ap]
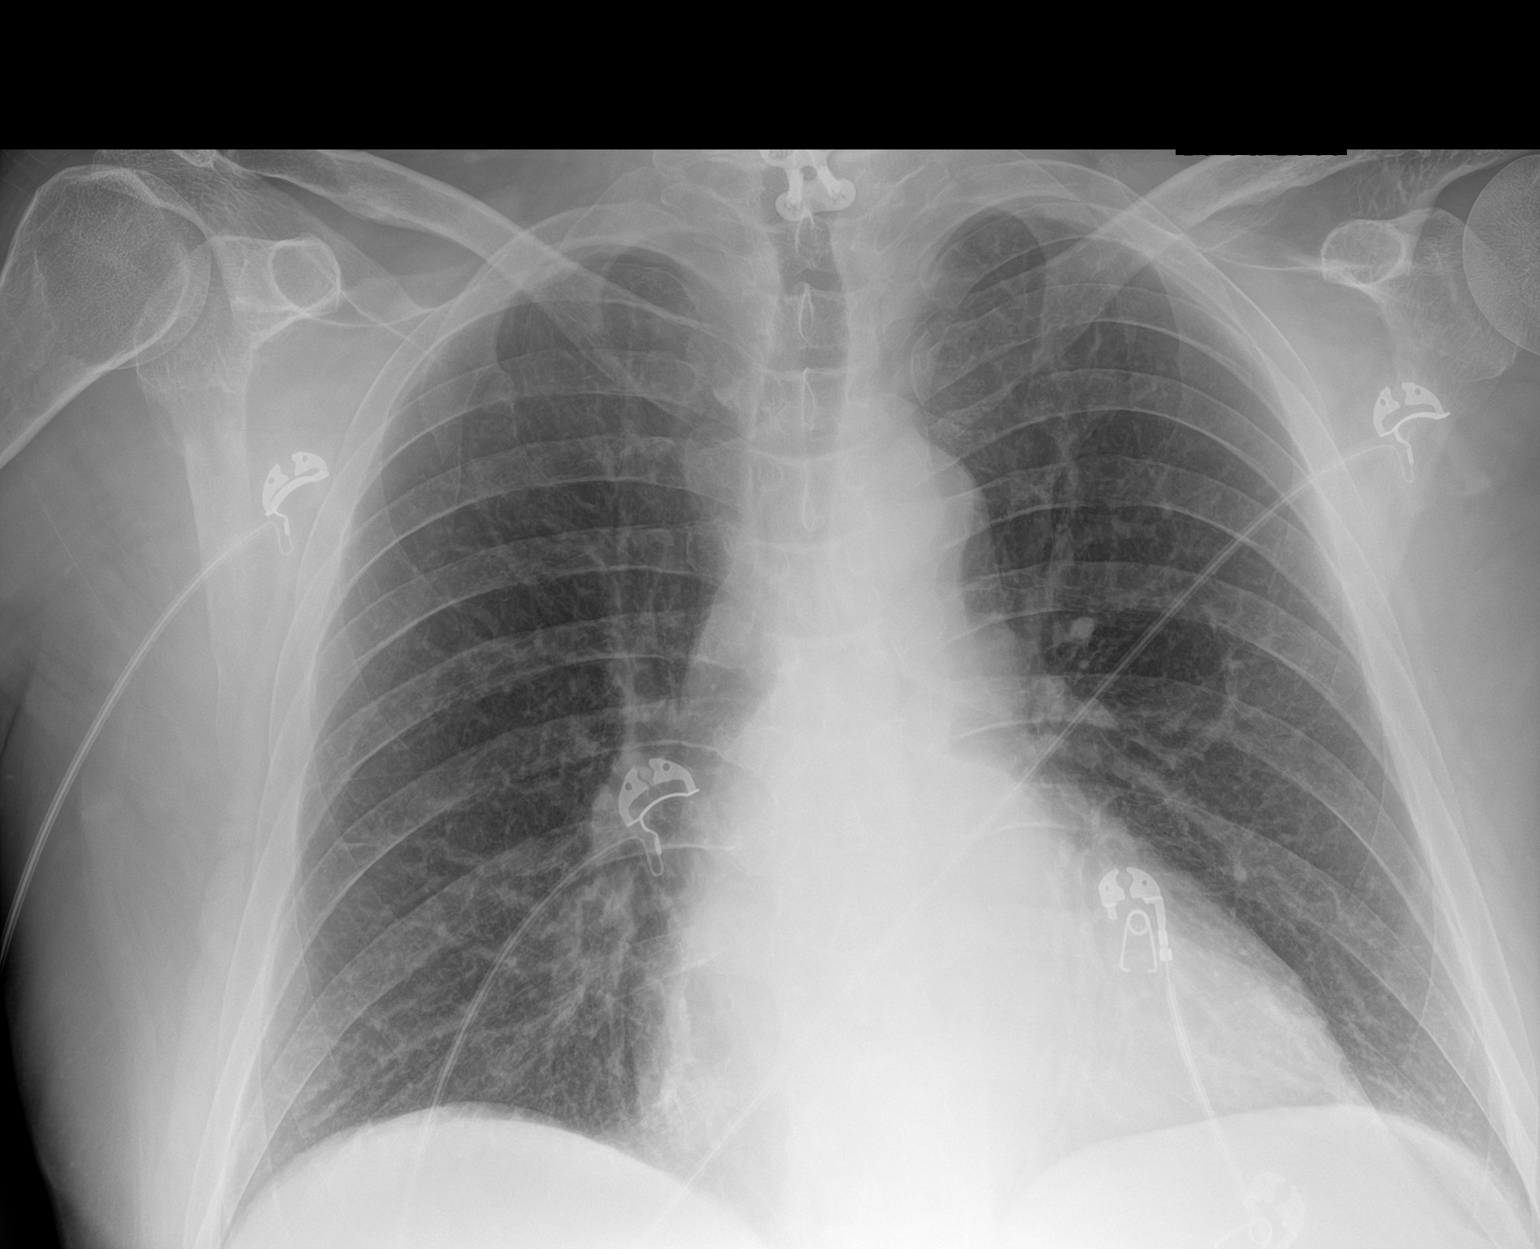

[1 of 1 positions shown; findings below may reference images not displayed]

FINDINGS: Lungs are clear.

Heart size and mediastinal contours are within normal limits.

No effusion.

Cervical fixation hardware.
IMPRESSION: No acute cardiopulmonary disease.

## 2020-10-19 DIAGNOSIS — F17200 Nicotine dependence, unspecified, uncomplicated: Secondary | ICD-10-CM | POA: Insufficient documentation

## 2020-10-19 DIAGNOSIS — F1090 Alcohol use, unspecified, uncomplicated: Secondary | ICD-10-CM | POA: Insufficient documentation

## 2021-03-12 ENCOUNTER — Emergency Department (HOSPITAL_COMMUNITY): Payer: Medicare Other

## 2021-03-12 ENCOUNTER — Emergency Department (HOSPITAL_COMMUNITY): Payer: Medicare Other | Admitting: Registered Nurse

## 2021-03-12 ENCOUNTER — Inpatient Hospital Stay (HOSPITAL_COMMUNITY): Payer: Medicare Other

## 2021-03-12 ENCOUNTER — Other Ambulatory Visit (HOSPITAL_COMMUNITY): Payer: Medicare Other

## 2021-03-12 ENCOUNTER — Inpatient Hospital Stay (HOSPITAL_COMMUNITY)
Admission: EM | Admit: 2021-03-12 | Discharge: 2021-03-17 | DRG: 023 | Disposition: A | Discharge status: Rehabilitation Facility | Payer: Medicare Other | Attending: Neurology | Admitting: Neurology

## 2021-03-12 ENCOUNTER — Encounter (HOSPITAL_COMMUNITY)
Admission: EM | Disposition: A | Discharge status: Rehabilitation Facility | Payer: Self-pay | Source: Home / Self Care | Attending: Neurology

## 2021-03-12 DIAGNOSIS — I6783 Posterior reversible encephalopathy syndrome: Secondary | ICD-10-CM | POA: Diagnosis not present

## 2021-03-12 DIAGNOSIS — I63511 Cerebral infarction due to unspecified occlusion or stenosis of right middle cerebral artery: Secondary | ICD-10-CM | POA: Diagnosis not present

## 2021-03-12 DIAGNOSIS — J9601 Acute respiratory failure with hypoxia: Secondary | ICD-10-CM | POA: Diagnosis present

## 2021-03-12 DIAGNOSIS — R29726 NIHSS score 26: Secondary | ICD-10-CM | POA: Diagnosis present

## 2021-03-12 DIAGNOSIS — Z79899 Other long term (current) drug therapy: Secondary | ICD-10-CM

## 2021-03-12 DIAGNOSIS — R1310 Dysphagia, unspecified: Secondary | ICD-10-CM | POA: Diagnosis not present

## 2021-03-12 DIAGNOSIS — F17210 Nicotine dependence, cigarettes, uncomplicated: Secondary | ICD-10-CM | POA: Diagnosis present

## 2021-03-12 DIAGNOSIS — R29810 Facial weakness: Secondary | ICD-10-CM | POA: Diagnosis present

## 2021-03-12 DIAGNOSIS — I639 Cerebral infarction, unspecified: Secondary | ICD-10-CM | POA: Diagnosis not present

## 2021-03-12 DIAGNOSIS — G8194 Hemiplegia, unspecified affecting left nondominant side: Secondary | ICD-10-CM | POA: Diagnosis present

## 2021-03-12 DIAGNOSIS — M6281 Muscle weakness (generalized): Secondary | ICD-10-CM | POA: Diagnosis not present

## 2021-03-12 DIAGNOSIS — I63512 Cerebral infarction due to unspecified occlusion or stenosis of left middle cerebral artery: Secondary | ICD-10-CM | POA: Diagnosis not present

## 2021-03-12 DIAGNOSIS — R7989 Other specified abnormal findings of blood chemistry: Secondary | ICD-10-CM | POA: Diagnosis not present

## 2021-03-12 DIAGNOSIS — E669 Obesity, unspecified: Secondary | ICD-10-CM | POA: Diagnosis present

## 2021-03-12 DIAGNOSIS — I4891 Unspecified atrial fibrillation: Secondary | ICD-10-CM | POA: Diagnosis not present

## 2021-03-12 DIAGNOSIS — Z9889 Other specified postprocedural states: Secondary | ICD-10-CM | POA: Diagnosis not present

## 2021-03-12 DIAGNOSIS — I517 Cardiomegaly: Secondary | ICD-10-CM | POA: Diagnosis not present

## 2021-03-12 DIAGNOSIS — F419 Anxiety disorder, unspecified: Secondary | ICD-10-CM | POA: Diagnosis present

## 2021-03-12 DIAGNOSIS — E876 Hypokalemia: Secondary | ICD-10-CM | POA: Diagnosis present

## 2021-03-12 DIAGNOSIS — M25561 Pain in right knee: Secondary | ICD-10-CM | POA: Diagnosis present

## 2021-03-12 DIAGNOSIS — Z20822 Contact with and (suspected) exposure to covid-19: Secondary | ICD-10-CM | POA: Diagnosis present

## 2021-03-12 DIAGNOSIS — Z7141 Alcohol abuse counseling and surveillance of alcoholic: Secondary | ICD-10-CM

## 2021-03-12 DIAGNOSIS — R410 Disorientation, unspecified: Secondary | ICD-10-CM | POA: Diagnosis not present

## 2021-03-12 DIAGNOSIS — I63411 Cerebral infarction due to embolism of right middle cerebral artery: Secondary | ICD-10-CM | POA: Diagnosis present

## 2021-03-12 DIAGNOSIS — D72829 Elevated white blood cell count, unspecified: Secondary | ICD-10-CM | POA: Diagnosis present

## 2021-03-12 DIAGNOSIS — N179 Acute kidney failure, unspecified: Secondary | ICD-10-CM | POA: Diagnosis present

## 2021-03-12 DIAGNOSIS — Z7289 Other problems related to lifestyle: Secondary | ICD-10-CM

## 2021-03-12 DIAGNOSIS — D72828 Other elevated white blood cell count: Secondary | ICD-10-CM | POA: Diagnosis not present

## 2021-03-12 DIAGNOSIS — Z716 Tobacco abuse counseling: Secondary | ICD-10-CM | POA: Diagnosis not present

## 2021-03-12 DIAGNOSIS — Z6826 Body mass index (BMI) 26.0-26.9, adult: Secondary | ICD-10-CM

## 2021-03-12 DIAGNOSIS — M25511 Pain in right shoulder: Secondary | ICD-10-CM | POA: Diagnosis present

## 2021-03-12 DIAGNOSIS — R569 Unspecified convulsions: Secondary | ICD-10-CM

## 2021-03-12 DIAGNOSIS — M109 Gout, unspecified: Secondary | ICD-10-CM | POA: Diagnosis not present

## 2021-03-12 DIAGNOSIS — I609 Nontraumatic subarachnoid hemorrhage, unspecified: Secondary | ICD-10-CM | POA: Diagnosis not present

## 2021-03-12 DIAGNOSIS — Z7901 Long term (current) use of anticoagulants: Secondary | ICD-10-CM | POA: Diagnosis not present

## 2021-03-12 DIAGNOSIS — R404 Transient alteration of awareness: Secondary | ICD-10-CM | POA: Diagnosis not present

## 2021-03-12 DIAGNOSIS — I10 Essential (primary) hypertension: Secondary | ICD-10-CM | POA: Diagnosis not present

## 2021-03-12 DIAGNOSIS — I480 Paroxysmal atrial fibrillation: Secondary | ICD-10-CM | POA: Diagnosis present

## 2021-03-12 DIAGNOSIS — Z823 Family history of stroke: Secondary | ICD-10-CM | POA: Diagnosis not present

## 2021-03-12 DIAGNOSIS — J9600 Acute respiratory failure, unspecified whether with hypoxia or hypercapnia: Secondary | ICD-10-CM

## 2021-03-12 DIAGNOSIS — F17200 Nicotine dependence, unspecified, uncomplicated: Secondary | ICD-10-CM | POA: Diagnosis not present

## 2021-03-12 DIAGNOSIS — G9340 Encephalopathy, unspecified: Secondary | ICD-10-CM | POA: Diagnosis not present

## 2021-03-12 DIAGNOSIS — I6523 Occlusion and stenosis of bilateral carotid arteries: Secondary | ICD-10-CM | POA: Diagnosis not present

## 2021-03-12 DIAGNOSIS — F1010 Alcohol abuse, uncomplicated: Secondary | ICD-10-CM

## 2021-03-12 DIAGNOSIS — R34 Anuria and oliguria: Secondary | ICD-10-CM | POA: Diagnosis present

## 2021-03-12 DIAGNOSIS — M25461 Effusion, right knee: Secondary | ICD-10-CM | POA: Diagnosis not present

## 2021-03-12 DIAGNOSIS — Z781 Physical restraint status: Secondary | ICD-10-CM

## 2021-03-12 DIAGNOSIS — Z1389 Encounter for screening for other disorder: Secondary | ICD-10-CM

## 2021-03-12 DIAGNOSIS — Z96642 Presence of left artificial hip joint: Secondary | ICD-10-CM | POA: Diagnosis not present

## 2021-03-12 DIAGNOSIS — I499 Cardiac arrhythmia, unspecified: Secondary | ICD-10-CM | POA: Diagnosis not present

## 2021-03-12 DIAGNOSIS — R29818 Other symptoms and signs involving the nervous system: Secondary | ICD-10-CM | POA: Diagnosis not present

## 2021-03-12 DIAGNOSIS — E785 Hyperlipidemia, unspecified: Secondary | ICD-10-CM | POA: Diagnosis not present

## 2021-03-12 DIAGNOSIS — W19XXXA Unspecified fall, initial encounter: Secondary | ICD-10-CM | POA: Diagnosis present

## 2021-03-12 DIAGNOSIS — R4182 Altered mental status, unspecified: Secondary | ICD-10-CM | POA: Diagnosis not present

## 2021-03-12 DIAGNOSIS — I4811 Longstanding persistent atrial fibrillation: Secondary | ICD-10-CM | POA: Diagnosis not present

## 2021-03-12 DIAGNOSIS — R0603 Acute respiratory distress: Secondary | ICD-10-CM | POA: Diagnosis not present

## 2021-03-12 DIAGNOSIS — Z043 Encounter for examination and observation following other accident: Secondary | ICD-10-CM | POA: Diagnosis not present

## 2021-03-12 HISTORY — PX: IR PERCUTANEOUS ART THROMBECTOMY/INFUSION INTRACRANIAL INC DIAG ANGIO: IMG6087

## 2021-03-12 HISTORY — PX: IR CT HEAD LTD: IMG2386

## 2021-03-12 HISTORY — PX: RADIOLOGY WITH ANESTHESIA: SHX6223

## 2021-03-12 LAB — I-STAT VENOUS BLOOD GAS, ED
Acid-Base Excess: 1 mmol/L (ref 0.0–2.0)
Bicarbonate: 24.9 mmol/L (ref 20.0–28.0)
Calcium, Ion: 1.09 mmol/L — ABNORMAL LOW (ref 1.15–1.40)
HCT: 54 % — ABNORMAL HIGH (ref 39.0–52.0)
Hemoglobin: 18.4 g/dL — ABNORMAL HIGH (ref 13.0–17.0)
O2 Saturation: 100 %
Potassium: 3.6 mmol/L (ref 3.5–5.1)
Sodium: 140 mmol/L (ref 135–145)
TCO2: 26 mmol/L (ref 22–32)
pCO2, Ven: 38.5 mmHg — ABNORMAL LOW (ref 44.0–60.0)
pH, Ven: 7.42 (ref 7.250–7.430)
pO2, Ven: 185 mmHg — ABNORMAL HIGH (ref 32.0–45.0)

## 2021-03-12 LAB — T4, FREE: Free T4: 1.15 ng/dL — ABNORMAL HIGH (ref 0.61–1.12)

## 2021-03-12 LAB — BLOOD GAS, VENOUS
Acid-base deficit: 0.2 mmol/L (ref 0.0–2.0)
Bicarbonate: 25.5 mmol/L (ref 20.0–28.0)
Drawn by: 164
FIO2: 100
O2 Saturation: 49.7 %
Patient temperature: 37
pCO2, Ven: 53.8 mmHg (ref 44.0–60.0)
pH, Ven: 7.297 (ref 7.250–7.430)
pO2, Ven: <32 mmHg — CL (ref 32.0–45.0)

## 2021-03-12 LAB — POCT I-STAT 7, (LYTES, BLD GAS, ICA,H+H)
Acid-base deficit: 4 mmol/L — ABNORMAL HIGH (ref 0.0–2.0)
Bicarbonate: 21.5 mmol/L (ref 20.0–28.0)
Calcium, Ion: 1.21 mmol/L (ref 1.15–1.40)
HCT: 51 % (ref 39.0–52.0)
Hemoglobin: 17.3 g/dL — ABNORMAL HIGH (ref 13.0–17.0)
O2 Saturation: 95 %
Patient temperature: 98.5
Potassium: 3.8 mmol/L (ref 3.5–5.1)
Sodium: 140 mmol/L (ref 135–145)
TCO2: 23 mmol/L (ref 22–32)
pCO2 arterial: 40.1 mmHg (ref 32.0–48.0)
pH, Arterial: 7.336 — ABNORMAL LOW (ref 7.350–7.450)
pO2, Arterial: 78 mmHg — ABNORMAL LOW (ref 83.0–108.0)

## 2021-03-12 LAB — CBC WITH DIFFERENTIAL/PLATELET
Abs Immature Granulocytes: 0.14 10*3/uL — ABNORMAL HIGH (ref 0.00–0.07)
Basophils Absolute: 0.1 10*3/uL (ref 0.0–0.1)
Basophils Relative: 0 %
Eosinophils Absolute: 0 10*3/uL (ref 0.0–0.5)
Eosinophils Relative: 0 %
HCT: 53.2 % — ABNORMAL HIGH (ref 39.0–52.0)
Hemoglobin: 18.6 g/dL — ABNORMAL HIGH (ref 13.0–17.0)
Immature Granulocytes: 1 %
Lymphocytes Relative: 5 %
Lymphs Abs: 1.1 10*3/uL (ref 0.7–4.0)
MCH: 35 pg — ABNORMAL HIGH (ref 26.0–34.0)
MCHC: 35 g/dL (ref 30.0–36.0)
MCV: 100 fL (ref 80.0–100.0)
Monocytes Absolute: 1.2 10*3/uL — ABNORMAL HIGH (ref 0.1–1.0)
Monocytes Relative: 6 %
Neutro Abs: 18.2 10*3/uL — ABNORMAL HIGH (ref 1.7–7.7)
Neutrophils Relative %: 88 %
Platelets: 217 10*3/uL (ref 150–400)
RBC: 5.32 MIL/uL (ref 4.22–5.81)
RDW: 13.5 % (ref 11.5–15.5)
WBC: 20.6 10*3/uL — ABNORMAL HIGH (ref 4.0–10.5)
nRBC: 0 % (ref 0.0–0.2)

## 2021-03-12 LAB — ETHANOL: Alcohol, Ethyl (B): <10 mg/dL (ref <10)

## 2021-03-12 LAB — AMMONIA: Ammonia: 31 umol/L (ref 9–35)

## 2021-03-12 LAB — I-STAT ARTERIAL BLOOD GAS, ED
Acid-Base Excess: 2 mmol/L (ref 0.0–2.0)
Bicarbonate: 27.3 mmol/L (ref 20.0–28.0)
Calcium, Ion: 1.23 mmol/L (ref 1.15–1.40)
HCT: 49 % (ref 39.0–52.0)
Hemoglobin: 16.7 g/dL (ref 13.0–17.0)
O2 Saturation: 100 %
Patient temperature: 98.7
Potassium: 3.4 mmol/L — ABNORMAL LOW (ref 3.5–5.1)
Sodium: 139 mmol/L (ref 135–145)
TCO2: 29 mmol/L (ref 22–32)
pCO2 arterial: 45 mmHg (ref 32.0–48.0)
pH, Arterial: 7.391 (ref 7.350–7.450)
pO2, Arterial: 428 mmHg — ABNORMAL HIGH (ref 83.0–108.0)

## 2021-03-12 LAB — COMPREHENSIVE METABOLIC PANEL
ALT: 16 U/L (ref 0–44)
AST: 24 U/L (ref 15–41)
Albumin: 3.7 g/dL (ref 3.5–5.0)
Alkaline Phosphatase: 154 U/L — ABNORMAL HIGH (ref 38–126)
Anion gap: 14 (ref 5–15)
BUN: 8 mg/dL (ref 8–23)
CO2: 20 mmol/L — ABNORMAL LOW (ref 22–32)
Calcium: 9.5 mg/dL (ref 8.9–10.3)
Chloride: 106 mmol/L (ref 98–111)
Creatinine, Ser: 1.17 mg/dL (ref 0.61–1.24)
GFR, Estimated: >60 mL/min (ref >60)
Glucose, Bld: 165 mg/dL — ABNORMAL HIGH (ref 70–99)
Potassium: 3.7 mmol/L (ref 3.5–5.1)
Sodium: 140 mmol/L (ref 135–145)
Total Bilirubin: 2.1 mg/dL — ABNORMAL HIGH (ref 0.3–1.2)
Total Protein: 6.9 g/dL (ref 6.5–8.1)

## 2021-03-12 LAB — LACTIC ACID, PLASMA: Lactic Acid, Venous: 3.7 mmol/L (ref 0.5–1.9)

## 2021-03-12 LAB — SALICYLATE LEVEL: Salicylate Lvl: <7 mg/dL — ABNORMAL LOW (ref 7.0–30.0)

## 2021-03-12 LAB — BRAIN NATRIURETIC PEPTIDE: B Natriuretic Peptide: 348.9 pg/mL — ABNORMAL HIGH (ref 0.0–100.0)

## 2021-03-12 LAB — HIV ANTIBODY (ROUTINE TESTING W REFLEX): HIV Screen 4th Generation wRfx: NONREACTIVE

## 2021-03-12 LAB — TSH: TSH: 3.703 u[IU]/mL (ref 0.350–4.500)

## 2021-03-12 LAB — TROPONIN I (HIGH SENSITIVITY)
Troponin I (High Sensitivity): 11 ng/L (ref <18)
Troponin I (High Sensitivity): 13 ng/L (ref <18)

## 2021-03-12 LAB — CK: Total CK: 130 U/L (ref 49–397)

## 2021-03-12 LAB — ACETAMINOPHEN LEVEL: Acetaminophen (Tylenol), Serum: <10 ug/mL — ABNORMAL LOW (ref 10–30)

## 2021-03-12 SURGERY — RADIOLOGY WITH ANESTHESIA
Anesthesia: General

## 2021-03-12 MED ORDER — LEVETIRACETAM IN NACL 1500 MG/100ML IV SOLN
1500.0000 mg | Freq: Two times a day (BID) | INTRAVENOUS | Status: DC
  Administered 2021-03-13 – 2021-03-15 (×5): 1500 mg via INTRAVENOUS
  Filled 2021-03-12 (×6): qty 100

## 2021-03-12 MED ORDER — METOPROLOL TARTRATE 5 MG/5ML IV SOLN
INTRAVENOUS | Status: AC
  Filled 2021-03-12: qty 5

## 2021-03-12 MED ORDER — LEVETIRACETAM IN NACL 1000 MG/100ML IV SOLN: 1000.0000 mg | Freq: Once | INTRAVENOUS | Status: DC

## 2021-03-12 MED ORDER — CLEVIDIPINE BUTYRATE 0.5 MG/ML IV EMUL
0.0000 mg/h | INTRAVENOUS | Status: DC
  Administered 2021-03-12: 1 mg/h via INTRAVENOUS
  Filled 2021-03-12: qty 50

## 2021-03-12 MED ORDER — IOHEXOL 240 MG/ML SOLN
50.0000 mL | Freq: Once | INTRAMUSCULAR | Status: AC | PRN
  Administered 2021-03-12: 25 mL via INTRA_ARTERIAL

## 2021-03-12 MED ORDER — STROKE: EARLY STAGES OF RECOVERY BOOK
Freq: Once | Status: AC
  Filled 2021-03-12: qty 1

## 2021-03-12 MED ORDER — CLEVIDIPINE BUTYRATE 0.5 MG/ML IV EMUL
0.0000 mg/h | INTRAVENOUS | Status: DC
  Administered 2021-03-12: 16 mg/h via INTRAVENOUS
  Administered 2021-03-12: 21 mg/h via INTRAVENOUS
  Administered 2021-03-13: 4 mg/h via INTRAVENOUS
  Filled 2021-03-12 (×3): qty 100

## 2021-03-12 MED ORDER — LACTATED RINGERS IV SOLN
INTRAVENOUS | Status: DC | PRN
Start: 2021-03-12 — End: 2021-03-12

## 2021-03-12 MED ORDER — ROCURONIUM BROMIDE 50 MG/5ML IV SOLN
100.0000 mg | Freq: Once | INTRAVENOUS | Status: AC
  Administered 2021-03-12: 100 mg via INTRAVENOUS

## 2021-03-12 MED ORDER — VERAPAMIL HCL 2.5 MG/ML IV SOLN
INTRAVENOUS | Status: AC
  Filled 2021-03-12: qty 2

## 2021-03-12 MED ORDER — LEVETIRACETAM IN NACL 1000 MG/100ML IV SOLN
1000.0000 mg | Freq: Once | INTRAVENOUS | Status: AC
  Administered 2021-03-12: 1000 mg via INTRAVENOUS

## 2021-03-12 MED ORDER — PHENYLEPHRINE 40 MCG/ML (10ML) SYRINGE FOR IV PUSH (FOR BLOOD PRESSURE SUPPORT)
PREFILLED_SYRINGE | INTRAVENOUS | Status: DC | PRN
  Administered 2021-03-12: 80 ug via INTRAVENOUS

## 2021-03-12 MED ORDER — IOHEXOL 350 MG/ML SOLN
75.0000 mL | Freq: Once | INTRAVENOUS | Status: AC | PRN
  Administered 2021-03-12: 75 mL via INTRAVENOUS

## 2021-03-12 MED ORDER — PHENYLEPHRINE HCL-NACL 20-0.9 MG/250ML-% IV SOLN: 25.0000 ug/min | INTRAVENOUS | Status: DC

## 2021-03-12 MED ORDER — PROPOFOL 1000 MG/100ML IV EMUL
0.0000 ug/kg/min | INTRAVENOUS | Status: DC
  Administered 2021-03-12: 20 ug/kg/min via INTRAVENOUS
  Administered 2021-03-12: 15 ug/kg/min via INTRAVENOUS
  Administered 2021-03-12: 35 ug/kg/min via INTRAVENOUS
  Administered 2021-03-13: 20 ug/kg/min via INTRAVENOUS
  Administered 2021-03-13 (×2): 40 ug/kg/min via INTRAVENOUS
  Administered 2021-03-14: 20 ug/kg/min via INTRAVENOUS
  Filled 2021-03-12 (×5): qty 100

## 2021-03-12 MED ORDER — FENTANYL CITRATE PF 50 MCG/ML IJ SOSY: 25.0000 ug | PREFILLED_SYRINGE | INTRAMUSCULAR | Status: DC | PRN

## 2021-03-12 MED ORDER — POLYETHYLENE GLYCOL 3350 17 G PO PACK
17.0000 g | PACK | Freq: Every day | ORAL | Status: DC
  Administered 2021-03-13 – 2021-03-14 (×2): 17 g
  Filled 2021-03-12 (×2): qty 1

## 2021-03-12 MED ORDER — VERAPAMIL HCL 2.5 MG/ML IV SOLN
INTRAVENOUS | Status: AC | PRN
  Administered 2021-03-12: 5 mg via INTRA_ARTERIAL

## 2021-03-12 MED ORDER — FENTANYL CITRATE (PF) 100 MCG/2ML IJ SOLN
INTRAMUSCULAR | Status: DC | PRN
  Administered 2021-03-12: 100 ug via INTRAVENOUS

## 2021-03-12 MED ORDER — LABETALOL HCL 5 MG/ML IV SOLN
20.0000 mg | Freq: Once | INTRAVENOUS | Status: AC
  Administered 2021-03-12: 20 mg via INTRAVENOUS
  Filled 2021-03-12: qty 4

## 2021-03-12 MED ORDER — DOCUSATE SODIUM 50 MG/5ML PO LIQD
100.0000 mg | Freq: Two times a day (BID) | ORAL | Status: DC
  Administered 2021-03-12 – 2021-03-14 (×4): 100 mg
  Filled 2021-03-12 (×4): qty 10

## 2021-03-12 MED ORDER — ONDANSETRON HCL 4 MG/2ML IJ SOLN
INTRAMUSCULAR | Status: DC | PRN
  Administered 2021-03-12: 4 mg via INTRAVENOUS

## 2021-03-12 MED ORDER — ACETAMINOPHEN 650 MG RE SUPP: 650.0000 mg | RECTAL | Status: DC | PRN

## 2021-03-12 MED ORDER — FENTANYL CITRATE PF 50 MCG/ML IJ SOSY
25.0000 ug | PREFILLED_SYRINGE | INTRAMUSCULAR | Status: DC | PRN
  Administered 2021-03-13: 100 ug via INTRAVENOUS
  Filled 2021-03-12: qty 2

## 2021-03-12 MED ORDER — METOPROLOL TARTRATE 5 MG/5ML IV SOLN
5.0000 mg | INTRAVENOUS | Status: DC | PRN
  Administered 2021-03-14: 5 mg via INTRAVENOUS
  Filled 2021-03-12 (×2): qty 5

## 2021-03-12 MED ORDER — PANTOPRAZOLE SODIUM 40 MG IV SOLR
40.0000 mg | Freq: Every day | INTRAVENOUS | Status: DC
  Administered 2021-03-12: 40 mg via INTRAVENOUS
  Filled 2021-03-12: qty 40

## 2021-03-12 MED ORDER — SENNOSIDES-DOCUSATE SODIUM 8.6-50 MG PO TABS: 1.0000 | ORAL_TABLET | Freq: Every evening | ORAL | Status: DC | PRN

## 2021-03-12 MED ORDER — LEVETIRACETAM IN NACL 1500 MG/100ML IV SOLN
1500.0000 mg | Freq: Two times a day (BID) | INTRAVENOUS | Status: DC
  Filled 2021-03-12: qty 100

## 2021-03-12 MED ORDER — DEXAMETHASONE SODIUM PHOSPHATE 10 MG/ML IJ SOLN
INTRAMUSCULAR | Status: DC | PRN
  Administered 2021-03-12: 10 mg via INTRAVENOUS

## 2021-03-12 MED ORDER — ACETAMINOPHEN 325 MG PO TABS
650.0000 mg | ORAL_TABLET | ORAL | Status: DC | PRN
  Administered 2021-03-16 – 2021-03-17 (×3): 650 mg
  Filled 2021-03-12 (×3): qty 2

## 2021-03-12 MED ORDER — METOPROLOL TARTRATE 5 MG/5ML IV SOLN
INTRAVENOUS | Status: DC | PRN
  Administered 2021-03-12 (×2): 1 mg via INTRAVENOUS
  Administered 2021-03-12: 2 mg via INTRAVENOUS
  Administered 2021-03-12: 1 mg via INTRAVENOUS

## 2021-03-12 MED ORDER — ACETAMINOPHEN 160 MG/5ML PO SOLN: 650.0000 mg | ORAL | Status: DC | PRN

## 2021-03-12 MED ORDER — ROCURONIUM BROMIDE 10 MG/ML (PF) SYRINGE
PREFILLED_SYRINGE | INTRAVENOUS | Status: DC | PRN
  Administered 2021-03-12 (×2): 40 mg via INTRAVENOUS

## 2021-03-12 MED ORDER — IOHEXOL 240 MG/ML SOLN
50.0000 mL | Freq: Once | INTRAMUSCULAR | Status: AC | PRN
  Administered 2021-03-12: 40 mL via INTRA_ARTERIAL

## 2021-03-12 MED ORDER — IOHEXOL 350 MG/ML SOLN
40.0000 mL | Freq: Once | INTRAVENOUS | Status: AC | PRN
  Administered 2021-03-12: 40 mL via INTRAVENOUS

## 2021-03-12 MED ORDER — ETOMIDATE 2 MG/ML IV SOLN
20.0000 mg | Freq: Once | INTRAVENOUS | Status: AC
  Administered 2021-03-12: 20 mg via INTRAVENOUS

## 2021-03-12 MED ORDER — SODIUM CHLORIDE 0.9 % IV SOLN: INTRAVENOUS | Status: DC

## 2021-03-12 MED ORDER — SODIUM CHLORIDE 0.9 % IV SOLN: 250.0000 mL | INTRAVENOUS | Status: DC

## 2021-03-12 MED ORDER — SODIUM CHLORIDE 0.9 % IV SOLN
40.0000 mg/kg | Freq: Once | INTRAVENOUS | Status: DC
  Filled 2021-03-12: qty 36

## 2021-03-12 NOTE — Progress Notes (Signed)
Transported pt from IR to 4N25 on full support and 100% FIO2 during transport.  Tolerated well.

## 2021-03-12 NOTE — Procedures (Addendum)
Intubation Procedure Note  Patrick Padilla  [id number]  [birth year]  Date:03/12/21  Time:4:22 PM   Provider Performing: ED physician Ancil Boozer, RRT assisted   Procedure: Intubation (31500)  Indication(s) Respiratory Failure  Consent Unable to obtain consent due to emergent nature of procedure.   Anesthesia     Time Out Verified patient identification, verified procedure, site/side was marked, verified correct patient position, special equipment/implants available, medications/allergies/relevant history reviewed, required imaging and test results available.   Sterile Technique Usual hand hygeine, masks, and gloves were used   Procedure Description Patient positioned in bed supine.  Sedation given as noted above.  Patient was intubated with endotracheal tube using Glidescope.  View was Grade 1 full glottis .  Number of attempts was 1.  Colorimetric CO2 detector was consistent with tracheal placement.   Complications/Tolerance None; patient tolerated the procedure well. Chest X-ray is ordered to verify placement.   EBL Minimal   Specimen(s) None

## 2021-03-12 NOTE — ED Notes (Signed)
Son Onalee Hua [phone] would like an update

## 2021-03-12 NOTE — H&P (Addendum)
NEUROLOGY H&P   Date of service: March 12, 2021 Patient Name: Patrick Padilla MRN:  [id number] DOB:  1955/08/16 Reason for admission: CODE IR FOR R M1 OCCLUSION _ _ _   _ __   _ __ _ _  __ __   _ __   __ _  History of Present Illness   65 yo patient with hx EtOH abuse and a fib (unclear if on [facility]) who presented to ED with L hemiplegia and R sided jerking after attempted cardioversion by EMS for a fib with RVR. Patient inubated on my examination and unable to provide history. Family is unreachable. Hx is second-hand from EMS via EDP. Per this report, patient last seen well when he went to bed at 10pm last night. Woke up this morning confused. Earlier this afternoon developed diaphoresis and chest discomfort. Called EMS who stated patient was in a fib with RVR. Attempted cardioversion en route. On arrival to ED per EDP patient was hemiplegic on the left with rhythmic jerking RUE. He was intubated with rocuronium for acute hypoxic respiratory failure. EDP contacted neurology and I activated a stroke code while he was in the CT scan. He remained paralyzed and so I could not perform a neurologic exam or NIHSS. Outside window for TNK. CTH possible subtle loss of gray-white differentiation in R lentiform nucleus and bilateral thalami ASPECTS 9/10. CTA showed R M1 occlusion. CTP showed R MCA infarct core 41ml with penumbra of .   At that point we were still unable to contact family therefore after discussion with neurointerventionalist I activated code IR under emergency protocol. I attempted contacting wife at all numbers listed in chart multiple times and was unable to reach. ED received a message to call [phone] to update son Onalee Hua but when I called the number he was not home and his fiance stated she had no way to get in touch with him until he got home from the store.   Patient taken to IR under emergency protocol for emergent thrombectomy for R M1 occlusion.    ROS   UTA 2/2 intubated and  sedated  Past History   Past Medical History:  Diagnosis Date   Anxiety    Elevated LFTs    History of colon polyps    HLD (hyperlipidemia)    HTN (hypertension)    Localized superficial swelling, mass, or lump    Past Surgical History:  Procedure Laterality Date   CARDIOVERSION N/A 02/05/2019   Procedure: CARDIOVERSION;  Surgeon: Wendall Stade, MD;  Location: [facility] ENDOSCOPY;  Service: Cardiovascular;  Laterality: N/A;   CERVICAL DISC SURGERY     LEG SURGERY     right leg and ankle   NECK SURGERY     cyst removed   TEE WITHOUT CARDIOVERSION N/A 02/05/2019   Procedure: TRANSESOPHAGEAL ECHOCARDIOGRAM (TEE);  Surgeon: Wendall Stade, MD;  Location: [facility] ENDOSCOPY;  Service: Cardiovascular;  Laterality: N/A;   TOTAL HIP ARTHROPLASTY     left   Family History  Problem Relation Age of Onset   Hepatitis Mother    Clotting disorder Mother    Stroke Mother    Thyroid disease Mother    Colon cancer Father        ?   Rheumatic fever Brother    Heart disease Brother    Social History   Socioeconomic History   Marital status: Married    Spouse name: Not on file   Number of children: 2   Years of education: Not  on file   Highest education level: Not on file  Occupational History   Occupation: disabled    Employer: SELF-EMPLOYED  Tobacco Use   Smoking status: Every Day   Smokeless tobacco: Never  Substance and Sexual Activity   Alcohol use: Yes    Comment: 3-4 a day   Drug use: No   Sexual activity: Not on file  Other Topics Concern   Not on file  Social History Narrative   Not on file   Social Determinants of Health   Financial Resource Strain: Not on file  Food Insecurity: Not on file  Transportation Needs: Not on file  Physical Activity: Not on file  Stress: Not on file  Social Connections: Not on file   No Known Allergies  Medications   Scheduled Meds:  docusate  100 mg Per Tube BID   polyethylene glycol  17 g Per Tube Daily   Continuous Infusions:   levETIRAcetam     And   levETIRAcetam     And   levETIRAcetam     And   levETIRAcetam     [START ON 03/13/2021] levETIRAcetam     propofol (DIPRIVAN) infusion Stopped (03/12/21 1658)   PRN Meds:.fentaNYL (SUBLIMAZE) injection, fentaNYL (SUBLIMAZE) injection     Vitals   Vitals:   03/12/21 1540 03/12/21 1545 03/12/21 1600 03/12/21 1632  BP: (!) 153/96 (!) 152/95 (!) 153/89 (!) 142/110  Pulse: (!) 154 (!) 148 (!) 126 (!) 117  Resp: 20 18 18 18   SpO2: 98% 99% 99% 99%  Weight: 90 kg     Height:         Body mass index is 26.91 kg/m.  Physical Exam   Physical Exam  Patient intubated with rocuronium within last 30 min and on my examination remained paralyzed. Pupils 40mm equal and reactive, no other brainstem reflexes, no response to noxious stimuli  NIHSS = 26 given paralysis however this is not reflective of his actual neurologic exam  NIHSS  1a Level of Conscious.: 3 1b LOC Questions: 2 1c LOC Commands: 2 2 Best Gaze: 0 3 Visual: 0 4 Facial Palsy: 0 5a Motor Arm - left: 4 5b Motor Arm - Right: 4 6a Motor Leg - Left: 4 6b Motor Leg - Right: 4 7 Limb Ataxia: 0 8 Sensory: 0 9 Best Language: 3 10 Dysarthria: U 11 Extinct. and Inatten.: 0  TOTAL: 26   Premorbid mRS = 2   Labs   CBC:  Recent Labs  Lab 03/12/21 1540 03/12/21 1600 03/12/21 1645  WBC 20.6*  --   --   NEUTROABS 18.2*  --   --   HGB 18.6* 18.4* 16.7  HCT 53.2* 54.0* 49.0  MCV 100.0  --   --   PLT 217  --   --     Basic Metabolic Panel:  Lab Results  Component Value Date   NA 139 03/12/2021   K 3.4 (L) 03/12/2021   CO2 20 (L) 03/12/2021   GLUCOSE 165 (H) 03/12/2021   BUN 8 03/12/2021   CREATININE 1.17 03/12/2021   CALCIUM 9.5 03/12/2021   GFRNONAA >60 03/12/2021   GFRAA >60 08/27/2019   Lipid Panel:  Lab Results  Component Value Date   LDLCALC 48 02/04/2019   HgbA1c:  Lab Results  Component Value Date   HGBA1C 5.2 02/04/2019   Urine Drug Screen: No results found for:  LABOPIA, COCAINSCRNUR, LABBENZ, AMPHETMU, THCU, LABBARB  Alcohol Level     Component Value Date/Time  ETH <10 03/12/2021 1540     Impression   65 yo man with hx a fib and EtOH abuse presenting with L hemiplegia and seizure on arrival 2/2 acute R M1 occlusion with mismatch on CTP. After discussion with Dr. Salvadore Dom we agreed to activate code IR and take him for emergent thrombectomy. Patient was taken under emergency protocol due to inability to reach any family members to consent. His penumbra was so large that not taking him to IR would have left him with a devastatingly disabling, and potentially fatal, ischemic stroke.   Update 1817: Spoke to son Onalee Hua by phone and updated him. He will come to hospital. He reported that fiance called 911 at 1400 2/2 patient having witnessed seizure. He stated patient has history of a fib, does not know patient's medications or if he is taking eliquis but can bring patient's medicine bottles to the hospital tonight. Patient drinks approx 1/4 gallon whiskey per night.   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 Salvadore Dom post-procedure: Mechanical thrombectomy performed with direct contact aspiration, total 3 passes with complete recanalization (TICI3). No embolus to new territory. Trace SAH in right Sylvian fissure seen on post procedure flat panel head CT. - Admit to NICU under Dr. Selina Cooley - Head CT tonight @ 2100 eval stability SAH - BP parameters per IR - MRI brain wo contrast - TTE  - Check A1c and LDL + add statin per guidelines - SCDs only for now 2/2 trace SAH until confirmed stable on repeat imaging - Hold antiplatelet for now given trace SAH - q4 hr neuro checks - STAT head CT for any change in neuro exam - Tele - PT/OT/SLP - Stroke education - Amb referral to neurology upon discharge  - Stroke team will assume care tomorrow  # Seizure 2/2 acute R MCA stroke - S/p LEV 40 mg/kg  load f/b 1500mg  q 12 hrs - STAT routine EEG f/b LTM  # EtOH abuse - Mgmt of potential withdrawal per CCM; d/w Dr.  # Ventilator mgmt - Per CCM  # A fib - Tele - Cardiology consult - TTE  # Med rec - Patient's son to bring medication bottles to hospital tonight.  Stroke team to assume care tomorrow.    This patient is critically ill and at significant risk of neurological worsening, death and care requires constant monitoring of vital signs, hemodynamics,respiratory and cardiac monitoring, neurological assessment, discussion with family, other specialists and medical decision making of high complexity. I spent 110 minutes of neurocritical care time  in the care of  this patient. This was time spent independent of any time provided by nurse practitioner or PA.  Delton Coombes, MD [facility] [phone]  If 7pm- 7am, please page neurology on call as listed in AMION.

## 2021-03-12 NOTE — Anesthesia Preprocedure Evaluation (Signed)
Anesthesia Evaluation  Patient identified by MRN, date of birth, ID band Patient unresponsive    Reviewed: Unable to perform ROS - Chart review onlyPreop documentation limited or incomplete due to emergent nature of procedure.  Airway Mallampati: Intubated       Dental   Pulmonary  Intubated by ed   breath sounds clear to auscultation       Cardiovascular hypertension, Pt. on medications + dysrhythmias Atrial Fibrillation  Rhythm:Regular     Neuro/Psych Anxiety CVA    GI/Hepatic negative GI ROS, Neg liver ROS,   Endo/Other    Renal/GU negative Renal ROS     Musculoskeletal negative musculoskeletal ROS (+)   Abdominal   Peds  Hematology eliquis Lab Results      Component                Value               Date                      WBC                      20.6 (H)            03/12/2021                HGB                      16.7                03/12/2021                HCT                      49.0                03/12/2021                MCV                      100.0               03/12/2021                PLT                      217                 03/12/2021              Anesthesia Other Findings   Reproductive/Obstetrics                             Anesthesia Physical Anesthesia Plan  ASA: 4 and emergent  Anesthesia Plan: General   Post-op Pain Management:    Induction: Inhalational  PONV Risk Score and Plan: 2 and Treatment may vary due to age or medical condition  Airway Management Planned: Oral ETT  Additional Equipment:   Intra-op Plan:   Post-operative Plan: Post-operative intubation/ventilation  Informed Consent:     Only emergency history available and History available from chart only  Plan Discussed with: CRNA and Anesthesiologist  Anesthesia Plan Comments:         Anesthesia Quick Evaluation

## 2021-03-12 NOTE — Transfer of Care (Signed)
Immediate Anesthesia Transfer of Care Note  Patient: Patrick Padilla  Procedure(s) Performed: RADIOLOGY WITH ANESTHESIA  Patient Location: PACU  Anesthesia Type:General  Level of Consciousness: Patient remains intubated per anesthesia plan  Airway & Oxygen Therapy: Patient remains intubated per anesthesia plan and Patient placed on Ventilator (see vital sign flow sheet for setting)  Post-op Assessment: Report given to RN and Post -op Vital signs reviewed and stable  Post vital signs: Reviewed and stable  Last Vitals:  Vitals Value Taken Time  BP 130/80 03/12/21 1838  Temp 36.9 C 03/12/21 1830  Pulse 83 03/12/21 1847  Resp 18 03/12/21 1847  SpO2 99 % 03/12/21 1847  Vitals shown include unvalidated device data.  Last Pain:  Vitals:   03/12/21 1830  TempSrc: Axillary         Complications: No notable events documented.

## 2021-03-12 NOTE — ED Provider Notes (Signed)
[facility] EMERGENCY DEPARTMENT Provider Note   CSN: [id number] Arrival date & time: 03/12/21  1531     History No chief complaint on file.   Kache Mcclurg is a 65 y.o. male with PMHx atrial fibrillation, HTN, HLD, alcohol use who presents for evaluation of altered mental status.  Patient is altered so history is limited at this time.  EMS provides history.  EMS reports a last known normal approximately 10 PM yesterday evening.  Patient was later observed sleeping around midnight.  Upon awakening this morning, family noted the patient to be confused.  EMS was subsequently called.  Upon EMS arrival, the patient was unresponsive and demonstrating rhythmic shaking in the right arm.  He was noted to be tachycardic, tachypneic, and flushed and diaphoretic.  Reportedly, he was moving all extremities at that time.  He was given 5 Versed without significant improvement.  He was placed on non-rebreather due to tachypnea.  EKG was concerning for atrial fibrillation with RVR.  He was cardioverted while in route with no change in rhythm.  Patient was transported to our emergency department for further evaluation.     Past Medical History:  Diagnosis Date   Anxiety    Elevated LFTs    History of colon polyps    HLD (hyperlipidemia)    HTN (hypertension)    Localized superficial swelling, mass, or lump     Patient Active Problem List   Diagnosis Date Noted   Acute ischemic right MCA stroke (HCC) 03/12/2021   Alcohol abuse    Seizure (HCC)    Essential hypertension 12/26/2019   Longstanding persistent atrial fibrillation (HCC)    Atrial fibrillation with RVR (HCC) 02/03/2019   Hyperlipidemia 02/03/2019   Anxiety 02/03/2019    Past Surgical History:  Procedure Laterality Date   CARDIOVERSION N/A 02/05/2019   Procedure: CARDIOVERSION;  Surgeon: Wendall Stade, MD;  Location: [facility] ENDOSCOPY;  Service: Cardiovascular;  Laterality: N/A;   CERVICAL DISC SURGERY     LEG SURGERY      right leg and ankle   NECK SURGERY     cyst removed   TEE WITHOUT CARDIOVERSION N/A 02/05/2019   Procedure: TRANSESOPHAGEAL ECHOCARDIOGRAM (TEE);  Surgeon: Wendall Stade, MD;  Location: [facility] ENDOSCOPY;  Service: Cardiovascular;  Laterality: N/A;   TOTAL HIP ARTHROPLASTY     left     Family History  Problem Relation Age of Onset   Hepatitis Mother    Clotting disorder Mother    Stroke Mother    Thyroid disease Mother    Colon cancer Father        ?   Rheumatic fever Brother    Heart disease Brother     Social History   Tobacco Use   Smoking status: Every Day   Smokeless tobacco: Never  Substance Use Topics   Alcohol use: Yes    Comment: 3-4 a day   Drug use: No    Home Medications Prior to Admission medications   Medication Sig Start Date End Date Taking? Authorizing Provider  ciprofloxacin (CIPRO) 500 MG tablet Take 1 tablet (500 mg total) by mouth 2 (two) times daily. 12/24/19   Croitoru, Mihai, MD  ELIQUIS 5 MG TABS tablet TAKE 1 TABLET BY MOUTH TWICE DAILY 07/07/20   Croitoru, Rachelle Hora, MD  metoprolol tartrate (LOPRESSOR) 100 MG tablet Take 1 tablet by mouth twice daily 04/28/20   Azalee Course, PA  Multiple Vitamin (MULTIVITAMIN) tablet Take 1 tablet by mouth daily.  [provider]  Naproxen Sodium (ALEVE PO) Take 1 tablet by mouth. As needed    [provider]  PARoxetine (PAXIL) 20 MG tablet Take 20 mg by mouth daily.    [provider]    Allergies    Patient has no known allergies.  Review of Systems   Review of Systems  Unable to perform ROS: Mental status change   Physical Exam Updated Vital Signs BP 114/73   Pulse 98   Temp 98.5 F (36.9 C) (Oral)   Resp (!) 25   Ht 6' (1.829 m)   Wt 90 kg   SpO2 96%   BMI 26.91 kg/m   Physical Exam Constitutional:      General: He is in acute distress.     Appearance: He is ill-appearing and diaphoretic.     Comments: Flushed, diaphoretic man in acute distress.  HENT:     Head:  Normocephalic and atraumatic.  Eyes:     Conjunctiva/sclera: Conjunctivae normal.     Pupils: Pupils are equal, round, and reactive to light.  Cardiovascular:     Rate and Rhythm: Regular rhythm. Tachycardia present.     Pulses: Normal pulses.     Heart sounds: Normal heart sounds.  Pulmonary:     Effort: Respiratory distress present.     Comments: Increased respiratory rate and effort.  Lungs clear to auscultation bilaterally. Abdominal:     General: Abdomen is flat. There is no distension.     Palpations: Abdomen is soft.  Musculoskeletal:     Cervical back: Normal range of motion.     Right lower leg: No edema.     Left lower leg: No edema.  Skin:    General: Skin is warm.     Capillary Refill: Capillary refill takes less than 2 seconds.  Neurological:     Comments: Unresponsive.  Pupils are 6 mm, equal, round, reactive to light.  Does not spontaneously move for withdraw from noxious stimuli in left hemibody.  Right hemibody with withdrawal from noxious stimuli.  Jerking movement of the right upper extremity.      ED Results / Procedures / Treatments   Labs (all labs ordered are listed, but only abnormal results are displayed) Labs Reviewed  COMPREHENSIVE METABOLIC PANEL - Abnormal; Notable for the following components:      Result Value   CO2 20 (*)    Glucose, Bld 165 (*)    Alkaline Phosphatase 154 (*)    Total Bilirubin 2.1 (*)    All other components within normal limits  CBC WITH DIFFERENTIAL/PLATELET - Abnormal; Notable for the following components:   WBC 20.6 (*)    Hemoglobin 18.6 (*)    HCT 53.2 (*)    MCH 35.0 (*)    Neutro Abs 18.2 (*)    Monocytes Absolute 1.2 (*)    Abs Immature Granulocytes 0.14 (*)    All other components within normal limits  LACTIC ACID, PLASMA - Abnormal; Notable for the following components:   Lactic Acid, Venous 3.7 (*)    All other components within normal limits  BLOOD GAS, VENOUS - Abnormal; Notable for the following  components:   pO2, Ven <32.0 (*)    All other components within normal limits  BRAIN NATRIURETIC PEPTIDE - Abnormal; Notable for the following components:   B Natriuretic Peptide 348.9 (*)    All other components within normal limits  ACETAMINOPHEN LEVEL - Abnormal; Notable for the following components:   Acetaminophen (Tylenol), Serum <  10 (*)    All other components within normal limits  SALICYLATE LEVEL - Abnormal; Notable for the following components:   Salicylate Lvl <7.0 (*)    All other components within normal limits  T4, FREE - Abnormal; Notable for the following components:   Free T4 1.15 (*)    All other components within normal limits  I-STAT VENOUS BLOOD GAS, ED - Abnormal; Notable for the following components:   pCO2, Ven 38.5 (*)    pO2, Ven 185.0 (*)    Calcium, Ion 1.09 (*)    HCT 54.0 (*)    Hemoglobin 18.4 (*)    All other components within normal limits  I-STAT ARTERIAL BLOOD GAS, ED - Abnormal; Notable for the following components:   pO2, Arterial 428 (*)    Potassium 3.4 (*)    All other components within normal limits  POCT I-STAT 7, (LYTES, BLD GAS, ICA,H+H) - Abnormal; Notable for the following components:   pH, Arterial 7.336 (*)    pO2, Arterial 78 (*)    Acid-base deficit 4.0 (*)    Hemoglobin 17.3 (*)    All other components within normal limits  CULTURE, BLOOD (ROUTINE X 2)  CULTURE, BLOOD (ROUTINE X 2)  URINE CULTURE  MRSA NEXT GEN BY PCR, NASAL  RESP PANEL BY RT-PCR (FLU A&B, COVID) ARPGX2  AMMONIA  ETHANOL  CK  TSH  HIV ANTIBODY (ROUTINE TESTING W REFLEX)  URINALYSIS, COMPLETE (UACMP) WITH MICROSCOPIC  RAPID URINE DRUG SCREEN, HOSP PERFORMED  BLOOD GAS, ARTERIAL  TRIGLYCERIDES  HEMOGLOBIN A1C  LIPID PANEL  CBG MONITORING, ED  TROPONIN I (HIGH SENSITIVITY)  TROPONIN I (HIGH SENSITIVITY)    EKG None  Radiology CT ANGIO HEAD NECK W WO CM  Result Date: 03/12/2021 CLINICAL DATA:  Neuro deficit.  Stroke suspected. EXAM: CT  ANGIOGRAPHY HEAD AND NECK TECHNIQUE: Multidetector CT imaging of the head and neck was performed using the standard protocol during bolus administration of intravenous contrast. Multiplanar CT image reconstructions and MIPs were obtained to evaluate the vascular anatomy. Carotid stenosis measurements (when applicable) are obtained utilizing NASCET criteria, using the distal internal carotid diameter as the denominator. CONTRAST:  75mL OMNIPAQUE IOHEXOL 350 MG/ML SOLN COMPARISON:  CT temporal bone 05/22/2011. FINDINGS: CT HEAD FINDINGS Brain: The right lentiform nucleus is slightly hypodense compared to the left. Insular ribbon is intact. No acute cortical abnormalities are present. There is a hypoattenuation involve the thalami bilaterally. No acute hemorrhage or mass lesion is present. The ventricles are of normal size. No significant extraaxial fluid collection is present. Vascular: Hyperdense right MCA noted. Atherosclerotic calcifications are present within the cavernous internal carotid arteries bilaterally. Skull: Calvarium is intact. No focal lytic or blastic lesions are present. No significant extracranial soft tissue lesion is present. Sinuses: The paranasal sinuses and mastoid air cells are clear. Orbits: The globes and orbits are within normal limits. Review of the MIP images confirms the above findings CTA NECK FINDINGS Aortic arch: Atherosclerotic calcifications are present at the origin left subclavian artery. Minimal calcifications are present otherwise. No aneurysm or stenosis is present. Right carotid system: Right common carotid artery is within normal limits. Atherosclerotic calcifications are present bifurcation proximal right ICA without significant stenosis. More distal cervical right ICA is within normal limits. Left carotid system: Left common carotid artery is within normal limits. Minimal atherosclerotic changes are noted at the bifurcation. Cervical left ICA is within normal limits.  Vertebral arteries: Left vertebral artery is the dominant vessel. No significant stenosis is  present in either vertebral artery. Both vertebral arteries originate from the subclavian arteries. Skeleton: Cervical fusion noted at C6-7. Endplate changes most evident at C3-4 and C5-6. Vertebral body heights are normal. No focal osseous lesions are present. The patient is edentulous. Other neck: The patient is intubated. Endotracheal tube in satisfactory position. Upper chest: The lung apices are clear. Thoracic inlet is within normal limits. Review of the MIP images confirms the above findings CTA HEAD FINDINGS Anterior circulation: Atherosclerotic calcifications are present within a cavernous internal carotid arteries bilaterally. No significant stenosis is present through the ICA terminus bilaterally. The A1 segments are within normal limits. Anterior communicating artery is patent. Right M1 occlusion is present with flow into a single inferior branch. Poor collaterals are present. A left MCA bifurcation is within normal limits. Left MCA branch vessels bilateral ACA branch vessels are normal. Posterior circulation: The left vertebral artery is the dominant vessel. The left PICA origin is visualized and normal. Right AICA is dominant. Basilar artery is within normal limits. Both posterior cerebral arteries originate from the basilar tip. The PCA branch vessels are within normal limits bilaterally. Venous sinuses: Dural sinuses are patent. Straight sinus deep cerebral veins are intact. Cortical veins are unremarkable. Anatomic variants: None Review of the MIP images confirms the above findings IMPRESSION: 1. Emergent large vessel occlusion of the right M1 segment with poor collaterals. 2. Subtle loss of gray-white differentiation in the right lentiform nucleus and bilateral thalami compatible with acute infarct. ASPECTS score 9/10 3. No acute hemorrhage. 4. Minimal atherosclerotic changes at the carotid bifurcations  bilaterally without significant stenosis. 5. Atherosclerotic changes within the cavernous internal carotid arteries bilaterally without significant stenosis. 6. Aortic Atherosclerosis (ICD10-I70.0). These results were called by telephone at the time of interpretation on 03/12/2021 at 4:08 pm to provider Dr. Selina Cooley, who verbally acknowledged these results. Electronically Signed   By: Marin Roberts M.D.   On: 03/12/2021 16:22   CT CEREBRAL PERFUSION W CONTRAST  Result Date: 03/12/2021 CLINICAL DATA:  Neuro deficit, acute, stroke suspected. EXAM: CT PERFUSION BRAIN TECHNIQUE: Multiphase CT imaging of the brain was performed following IV bolus contrast injection. Subsequent parametric perfusion maps were calculated using RAPID software. CONTRAST:  82mL OMNIPAQUE IOHEXOL 350 MG/ML SOLN COMPARISON:  CTA head and neck of the same day. FINDINGS: CT Brain Perfusion Findings: CBF (<30%) Volume: 71mL Perfusion (Tmax>6.0s) volume: Mismatch Volume: ASPECTS on noncontrast CT Head: 9/10 at 3:55 p.m. today. Infarction Location:Right MCA territory. Core infarct is in the right lentiform nucleus. IMPRESSION: 1. Right MCA territory infarct with a core infarct of 11 mL and surrounding penumbra of 164 mL. 2. Core infarct is in the right lentiform nucleus. The above was relayed via text pager to Dr. Bing Neighbors on 03/12/2021 at 16:20 . Electronically Signed   By: Marin Roberts M.D.   On: 03/12/2021 16:26   DG Chest Portable 1 View  Result Date: 03/12/2021 CLINICAL DATA:  Status post intubation. EXAM: PORTABLE CHEST 1 VIEW COMPARISON:  Chest radiograph dated 08/27/2019. FINDINGS: An endotracheal tube terminates in the midthoracic trachea. The heart is enlarged. There is a minimal atelectasis or interstitial opacity in the right mid lung and the left lung base. There is no pleural effusion or pneumothorax. Degenerative changes are seen in the spine. IMPRESSION: Endotracheal tube in the midthoracic trachea.  Minimal atelectasis or interstitial opacities in the right mid lung and left lung base. Electronically Signed   By: Romona Curls M.D.   On: 03/12/2021 16:24  Procedures Procedure Name: Intubation Date/Time: 03/12/2021 3:35 PM Performed by: Holley Dexter, MD      Medications Ordered in ED Medications  propofol (DIPRIVAN) 1000 MG/100ML infusion (35 mcg/kg/min  90 kg Intravenous New Bag/Given 03/12/21 2323)  docusate (COLACE) 50 MG/5ML liquid 100 mg (100 mg Per Tube Given 03/12/21 2133)  polyethylene glycol (MIRALAX / GLYCOLAX) packet 17 g (has no administration in time range)  fentaNYL (SUBLIMAZE) injection 25 mcg (has no administration in time range)  fentaNYL (SUBLIMAZE) injection 25-100 mcg (has no administration in time range)  levETIRAcetam (KEPPRA) IVPB 1000 mg/100 mL premix (1,000 mg Intravenous Given 03/12/21 1733)    And  levETIRAcetam (KEPPRA) IVPB 1000 mg/100 mL premix (1,000 mg Intravenous Given 03/12/21 1714)    And  levETIRAcetam (KEPPRA) IVPB 1000 mg/100 mL premix (has no administration in time range)    And  levETIRAcetam (KEPPRA) IVPB 1000 mg/100 mL premix (has no administration in time range)  levETIRAcetam (KEPPRA) IVPB 1500 mg/ 100 mL premix (has no administration in time range)  metoprolol tartrate (LOPRESSOR) injection 5 mg (has no administration in time range)  pantoprazole (PROTONIX) injection 40 mg (40 mg Intravenous Given 03/12/21 2133)  verapamil (ISOPTIN) 2.5 MG/ML injection (has no administration in time range)   stroke: mapping our early stages of recovery book (has no administration in time range)  0.9 %  sodium chloride infusion ( Intravenous New Bag/Given 03/12/21 1837)  acetaminophen (TYLENOL) tablet 650 mg (has no administration in time range)    Or  acetaminophen (TYLENOL) 160 MG/5ML solution 650 mg (has no administration in time range)    Or  acetaminophen (TYLENOL) suppository 650 mg (has no administration in time range)  senna-docusate (Senokot-S) tablet  1 tablet (has no administration in time range)  clevidipine (CLEVIPREX) infusion 0.5 mg/mL (16 mg/hr Intravenous New Bag/Given 03/12/21 2143)  0.9 %  sodium chloride infusion (0 mLs Intravenous Hold 03/12/21 2002)  phenylephrine (NEO-SYNEPHRINE) 20mg /NS premix infusion (0 mcg/min Intravenous Hold 03/12/21 2002)  rocuronium (ZEMURON) injection 100 mg (100 mg Intravenous Given 03/12/21 1533)  etomidate (AMIDATE) injection 20 mg (20 mg Intravenous Given 03/12/21 1532)  iohexol (OMNIPAQUE) 350 MG/ML injection 75 mL (75 mLs Intravenous Contrast Given 03/12/21 1604)  iohexol (OMNIPAQUE) 350 MG/ML injection 40 mL (40 mLs Intravenous Contrast Given 03/12/21 1608)  metoprolol tartrate (LOPRESSOR) 5 MG/5ML injection (  Override pull for Anesthesia 03/12/21 1720)  metoprolol tartrate (LOPRESSOR) 5 MG/5ML injection (  Override pull for Anesthesia 03/12/21 1721)  iohexol (OMNIPAQUE) 240 MG/ML injection 50 mL (25 mLs Intra-arterial Contrast Given 03/12/21 1829)  iohexol (OMNIPAQUE) 240 MG/ML injection 50 mL (40 mLs Intra-arterial Contrast Given 03/12/21 1828)  verapamil (ISOPTIN) injection (5 mg Intra-arterial Given 03/12/21 1739)  labetalol (NORMODYNE) injection 20 mg (20 mg Intravenous Given 03/12/21 2129)    ED Course  I have reviewed the triage vital signs and the nursing notes.  Pertinent labs & imaging results that were available during my care of the patient were reviewed by me and considered in my medical decision making (see chart for details).    MDM Rules/Calculators/A&P                           65 y.o. male with past medical history as above who presents for evaluation of altered mental status.  Upon arrival, patient is profoundly altered with GCS 6.  He is demonstrating focal seizure-like activity in the right arm, with a left hemiparesis.  Decision was made  to intubate for airway protection.  Intubation performed as documented above.  Patient was placed on propofol sedation.  After airway was secured, the  patient was transported to the CT scanner for emergent CTA.  Primary considerations included status epilepticus, Todd's paralysis, acute ischemic stroke, intracranial hemorrhage, or toxic/metabolic causes.  Given last known normal approximately 10 PM, patient is outside of tPA window.  Neurology was consulted for further recommendations, who advised that we activated code stroke.  Neurology arrived at bedside and independently evaluated the patient.  Labs are notable for leukocytosis with left shift, elevated hemoglobin, non-anion gap metabolic acidosis with bicarbonate 20, elevated lactic of 3.7.  VBG with pH 7.42, PCO2 38, and PO2 25.  CTA demonstrated a right M1 LVO with right MCA core infarct 11 mL and large surrounding penumbra of 164 mL.  After discussion with neurology, patient will proceed to IR for endovascular intervention.   Final Clinical Impression(s) / ED Diagnoses Final diagnoses:  Altered mental status, unspecified altered mental status type  Ischemic stroke [facility])    Rx / DC Orders ED Discharge Orders     None        Holley Dexter, MD 03/12/21 2351    Milagros Loll, MD 03/13/21 1526

## 2021-03-12 NOTE — Code Documentation (Signed)
Pt here via EMS with AMS, seizure like activity and labored brething. EMS gave 5mg  versed in the field. Pt unable to move left side of body

## 2021-03-12 NOTE — Progress Notes (Signed)
Patient transported on ventilator to CT and back to TRA-A with no complications.

## 2021-03-12 NOTE — Sedation Documentation (Signed)
ED nurse obtain patient's sons contact information: Patrick Padilla [phone]

## 2021-03-12 NOTE — Consult Note (Signed)
NAME:  Patrick Padilla, MRN:  [id number], DOB:  1955-07-28, LOS: 0 ADMISSION DATE:  03/12/2021, CONSULTATION DATE:  9/3 REFERRING MD:  Dr Selina Cooley, CHIEF COMPLAINT:  acute resp failure   History of Present Illness:  65 year old man history of tobacco use (?amount), hypertension, alcohol abuse, atrial fibrillation on Eliquis (unclear compliance).  Last seen normal 22:00 9/2, noted with altered mental status, confusion morning 9/3.  Then evolved chest discomfort diaphoresis.  EMS activated.  Some report of questionable seizure activity although unclear timing.  Found to be tachycardic, question atrial fibrillation plus RVR.  Cardioversion was attempted in the field.  On arrival to the ED patient had left hemiplegia with rhythmic jerking of the right upper extremity.  He required intubation and ventilation for impaired airway protection and hypoxemia.  TNK deferred, unclear timing window for symptom onset.  CT/angio showed possible infarct right lentiform nucleus and bilateral thalami, right M1 occlusion, right MCA infarct.  No family available.  He has been seen by neurology and is going urgently for neuro IR intervention, sedated and intubated.  Pertinent  Medical History   Past Medical History:  Diagnosis Date   Anxiety    Elevated LFTs    History of colon polyps    HLD (hyperlipidemia)    HTN (hypertension)    Localized superficial swelling, mass, or lump     Significant Hospital Events: Including procedures, antibiotic start and stop dates in addition to other pertinent events   CT/angio Head >> large vessel occlusion right M1 segment, evolving acute infarct right lentiform nucleus, bilateral thalami, MCA territory.  No evidence of an acute hemorrhage.  Minimal atherosclerotic changes bilateral carotids.  Interim History / Subjective:  No family available, history available from ED providers and from Neurology  Objective   Blood pressure (!) 142/110, pulse (!) 117, resp. rate 18, height 6'  (1.829 m), weight 90 kg, SpO2 99 %.    Vent Mode: PRVC FiO2 (%):  [100 %] 100 % Set Rate:  [18 bmp] 18 bmp Vt Set:  [620 mL] 620 mL PEEP:  [5 cmH20] 5 cmH20  No intake or output data in the 24 hours ending 03/12/21 1655 Filed Weights   03/12/21 1540  Weight: 90 kg    Examination: General: Obese man, intubated, sedated and paralyzed HENT: ET tube in position, oropharynx otherwise clear, pupils are equal Lungs: Clear bilaterally, distant, decreased to both bases, no wheezing Cardiovascular: Tachycardic, distant, regular without a murmur Abdomen: Obese, nondistended, positive bowel sounds Extremities: No apparent edema Neuro: Paralyzed and sedated.  No corneals.  Pupils do react and are equal  Resolved Hospital Problem list     Assessment & Plan:  Acute right lentiform nucleus, MCA territory CVA -Going urgently to interventional radiology 9/3 for possible thrombectomy and revascularization -SBP goal post procedure as per neurology and interventional radiology orders.  -Initiate anticoagulation and antiplatelet meds as per neurology orders -Anticipate repeat head CT and/or MRI brain in a.m. 9/4  Acute respiratory failure, principally due to encephalopathy.  Consider contribution of hemodynamic instability in the setting of tachycardia -PRVC 8 cc/kg.  Plan to continue sedation and mechanical ventilation overnight 9/3.  Assess for SBT/PSV on 9/4 after repeat imaging.  Hopefully he will be a candidate for consideration of extubation depending on his mental status, presumed airway protection -Pulmonary hygiene -VAP prevention orders -Follow chest x-ray -ABG on arrival to ICU  Encephalopathy, need for sedation -PAD protocol, propofol infusion and fentanyl as needed  Atrial fibrillation with RVR -Unclear whether  he was compliant with his Eliquis as an outpatient.  Defer anticoagulation timing to neurology post procedure -Rate control with metoprolol.  Consider initiation diltiazem  infusion if control difficult.  Hypertension -Hold home metoprolol for now  Hx EtOH use -Watch for any evidence for withdrawal    Best Practice (right click and "Reselect all SmartList Selections" daily)   Diet/type: NPO DVT prophylaxis: SCD GI prophylaxis: PPI Lines: N/A Foley:  Yes, and it is still needed Code Status:  full code Last date of multidisciplinary goals of care discussion [pending]  Labs   CBC: Recent Labs  Lab 03/12/21 1540 03/12/21 1600 03/12/21 1645  WBC 20.6*  --   --   NEUTROABS 18.2*  --   --   HGB 18.6* 18.4* 16.7  HCT 53.2* 54.0* 49.0  MCV 100.0  --   --   PLT 217  --   --     Basic Metabolic Panel: Recent Labs  Lab 03/12/21 1540 03/12/21 1600 03/12/21 1645  NA 140 140 139  K 3.7 3.6 3.4*  CL 106  --   --   CO2 20*  --   --   GLUCOSE 165*  --   --   BUN 8  --   --   CREATININE 1.17  --   --   CALCIUM 9.5  --   --    GFR: Estimated Creatinine Clearance: 69.1 mL/min (by C-G formula based on SCr of 1.17 mg/dL). Recent Labs  Lab 03/12/21 1540  WBC 20.6*  LATICACIDVEN 3.7*    Liver Function Tests: Recent Labs  Lab 03/12/21 1540  AST 24  ALT 16  ALKPHOS 154*  BILITOT 2.1*  PROT 6.9  ALBUMIN 3.7   No results for input(s): LIPASE, AMYLASE in the last 168 hours. Recent Labs  Lab 03/12/21 1540  AMMONIA 31    ABG    Component Value Date/Time   PHART 7.391 03/12/2021 1645   PCO2ART 45.0 03/12/2021 1645   PO2ART 428 (H) 03/12/2021 1645   HCO3 27.3 03/12/2021 1645   TCO2 29 03/12/2021 1645   O2SAT 100.0 03/12/2021 1645     Coagulation Profile: No results for input(s): INR, PROTIME in the last 168 hours.  Cardiac Enzymes: No results for input(s): CKTOTAL, CKMB, CKMBINDEX, TROPONINI in the last 168 hours.  HbA1C: Hgb A1c MFr Bld  Date/Time Value Ref Range Status  02/04/2019 03:16 AM 5.2 4.8 - 5.6 % Final    Comment:    (NOTE) Pre diabetes:          5.7%-6.4% Diabetes:              >6.4% Glycemic control for    <7.0% adults with diabetes     CBG: No results for input(s): GLUCAP in the last 168 hours.  Review of Systems:   Unable to obtain  Past Medical History:  He,  has a past medical history of Anxiety, Elevated LFTs, History of colon polyps, HLD (hyperlipidemia), HTN (hypertension), and Localized superficial swelling, mass, or lump.   Surgical History:   Past Surgical History:  Procedure Laterality Date   CARDIOVERSION N/A 02/05/2019   Procedure: CARDIOVERSION;  Surgeon: Wendall Stade, MD;  Location: [facility] ENDOSCOPY;  Service: Cardiovascular;  Laterality: N/A;   CERVICAL DISC SURGERY     LEG SURGERY     right leg and ankle   NECK SURGERY     cyst removed   TEE WITHOUT CARDIOVERSION N/A 02/05/2019   Procedure: TRANSESOPHAGEAL ECHOCARDIOGRAM (TEE);  Surgeon:  Wendall Stade, MD;  Location: [facility] ENDOSCOPY;  Service: Cardiovascular;  Laterality: N/A;   TOTAL HIP ARTHROPLASTY     left     Social History:   reports that he has been smoking. He has never used smokeless tobacco. He reports current alcohol use. He reports that he does not use drugs.   Family History:  His family history includes Clotting disorder in his mother; Colon cancer in his father; Heart disease in his brother; Hepatitis in his mother; Rheumatic fever in his brother; Stroke in his mother; Thyroid disease in his mother.   Allergies No Known Allergies   Home Medications  Prior to Admission medications   Medication Sig Start Date End Date Taking? Authorizing Provider  ciprofloxacin (CIPRO) 500 MG tablet Take 1 tablet (500 mg total) by mouth 2 (two) times daily. 12/24/19   Croitoru, Mihai, MD  ELIQUIS 5 MG TABS tablet TAKE 1 TABLET BY MOUTH TWICE DAILY 07/07/20   Croitoru, Rachelle Hora, MD  metoprolol tartrate (LOPRESSOR) 100 MG tablet Take 1 tablet by mouth twice daily 04/28/20   Azalee Course, PA  Multiple Vitamin (MULTIVITAMIN) tablet Take 1 tablet by mouth daily.    [provider]  Naproxen Sodium (ALEVE PO) Take 1  tablet by mouth. As needed    [provider]  PARoxetine (PAXIL) 20 MG tablet Take 20 mg by mouth daily.    [provider]     Critical care time: 40 minutes     Levy Pupa, MD, PhD 03/12/2021, 5:38 PM [facility] [phone] or if no answer before 7:00PM call [phone] For any issues after 7:00PM please call eLink [phone]

## 2021-03-12 NOTE — Procedures (Signed)
History: 65 yo M with status epilepticus and M1 occlusion  Sedation: Propofol 5 mcg/kg/min  Technique: This EEG was acquired with electrodes placed according to the International 10-20 electrode system (including Fp1, Fp2, F3, F4, C3, C4, P3, P4, O1, O2, T3, T4, T5, T6, A1, A2, Fz, Cz, Pz). The following electrodes were missing or displaced: none.  Background: The background consists of generalized irregular delta and theta range slow activity punctuated by occasional sleep spindles.  There is no posterior dominant rhythm seen.  There is no epileptiform discharges seen.  Photic stimulation: Physiologic driving is not performed  EEG Abnormalities: 1) sedated EEG  Clinical Interpretation: This EEG is consistent with the patient's sedated state. There was no seizure or seizure predisposition recorded on this study. Please note that lack of epileptiform activity on EEG does not preclude the possibility of epilepsy.   Ritta Slot, MD [facility] [phone]  If 7pm- 7am, please page neurology on call as listed in AMION.

## 2021-03-12 NOTE — Progress Notes (Signed)
EEG complete - results pending 

## 2021-03-12 NOTE — Sedation Documentation (Signed)
Transported patient to [phone] with Anesthesia and Respiratory. Bedside report given to [facility]. Right femoral dressing intact and no hematoma. Obtain pulses by doppler.

## 2021-03-12 NOTE — Progress Notes (Addendum)
eLink Physician-Brief Progress Note Patient Name: Patrick Padilla DOB: July 25, 1955 MRN: [id number]   Date of Service  03/12/2021  HPI/Events of Note  Seen on camera. Clarified BP goal per sign out. SBP 120-140 and has been in this range per RN, while on propofol and Cleviprex Patient also more purposeful   eICU Interventions  D/w RN     Intervention Category Major Interventions: Respiratory failure - evaluation and management  Oretha Milch 03/12/2021, 9:42 PM  Addendum 9:55 pm- Restraints ordered to prevent self extubation

## 2021-03-12 NOTE — Progress Notes (Signed)
Patient transported on ventilator to IR with no complications.

## 2021-03-12 NOTE — Progress Notes (Signed)
Pt transported to and from CT w/o event.  RT suctioned before leaving.  RT will continue to monitor.

## 2021-03-12 NOTE — Procedures (Signed)
INTERVENTIONAL NEURORADIOLOGY BRIEF POSTPROCEDURE NOTE  DIAGNOSTIC CEREBRAL ANGIOGRAM AND MECHANICAL THROMBECTOMY  Attending: Dr. Jerilynn Mages de Melchor Amour  Assistant: None.  Diagnosis: Right M1/MCA occlusion  Access site: RCFA, 43F  Access closure: Perclose Prostyle  Anesthesia: General  Medication used: Refer to anesthesia documentation.  Complications: Trace SAH right Sylvian fissure.  Estimated blood loss:  Specimen: None.  Findings: Proximal right M1/MCA occlusion. Mechanical thrombectomy performed with direct contact aspiration, total 3 passes with complete recanalization (TICI3). No embolus to new territory. Trace SAH in right Sylvian fissure seen on post procedure flat panel head CT.  PLAN: - Patient remains intubated and will be transferred to ICU - Head CT in 3h to evaluate for [facility] stability - Bed rest x6 hours s/p femoral puncture - SBP120-140 mmHg

## 2021-03-13 ENCOUNTER — Inpatient Hospital Stay (HOSPITAL_COMMUNITY): Payer: Medicare Other

## 2021-03-13 DIAGNOSIS — Z9889 Other specified postprocedural states: Secondary | ICD-10-CM | POA: Diagnosis not present

## 2021-03-13 DIAGNOSIS — G9340 Encephalopathy, unspecified: Secondary | ICD-10-CM | POA: Diagnosis not present

## 2021-03-13 DIAGNOSIS — Z96642 Presence of left artificial hip joint: Secondary | ICD-10-CM | POA: Diagnosis not present

## 2021-03-13 DIAGNOSIS — J9601 Acute respiratory failure with hypoxia: Secondary | ICD-10-CM

## 2021-03-13 DIAGNOSIS — I639 Cerebral infarction, unspecified: Secondary | ICD-10-CM

## 2021-03-13 DIAGNOSIS — I63511 Cerebral infarction due to unspecified occlusion or stenosis of right middle cerebral artery: Secondary | ICD-10-CM | POA: Diagnosis not present

## 2021-03-13 LAB — BASIC METABOLIC PANEL
Anion gap: 12 (ref 5–15)
Anion gap: 10 (ref 5–15)
BUN: 12 mg/dL (ref 8–23)
BUN: 19 mg/dL (ref 8–23)
CO2: 20 mmol/L — ABNORMAL LOW (ref 22–32)
CO2: 23 mmol/L (ref 22–32)
Calcium: 8.8 mg/dL — ABNORMAL LOW (ref 8.9–10.3)
Calcium: 8.5 mg/dL — ABNORMAL LOW (ref 8.9–10.3)
Chloride: 105 mmol/L (ref 98–111)
Chloride: 106 mmol/L (ref 98–111)
Creatinine, Ser: 1.23 mg/dL (ref 0.61–1.24)
Creatinine, Ser: 1.25 mg/dL — ABNORMAL HIGH (ref 0.61–1.24)
GFR, Estimated: >60 mL/min (ref >60)
GFR, Estimated: >60 mL/min (ref >60)
Glucose, Bld: 133 mg/dL — ABNORMAL HIGH (ref 70–99)
Glucose, Bld: 101 mg/dL — ABNORMAL HIGH (ref 70–99)
Potassium: 3.8 mmol/L (ref 3.5–5.1)
Potassium: 3.6 mmol/L (ref 3.5–5.1)
Sodium: 137 mmol/L (ref 135–145)
Sodium: 139 mmol/L (ref 135–145)

## 2021-03-13 LAB — RESP PANEL BY RT-PCR (FLU A&B, COVID) ARPGX2
Influenza A by PCR: NEGATIVE
Influenza B by PCR: NEGATIVE
SARS Coronavirus 2 by RT PCR: NEGATIVE

## 2021-03-13 LAB — BLOOD CULTURE ID PANEL (REFLEXED) - BCID2

## 2021-03-13 LAB — MRSA NEXT GEN BY PCR, NASAL: MRSA by PCR Next Gen: NOT DETECTED

## 2021-03-13 LAB — CBC
HCT: 47.6 % (ref 39.0–52.0)
Hemoglobin: 16.3 g/dL (ref 13.0–17.0)
MCH: 34.9 pg — ABNORMAL HIGH (ref 26.0–34.0)
MCHC: 34.2 g/dL (ref 30.0–36.0)
MCV: 101.9 fL — ABNORMAL HIGH (ref 80.0–100.0)
Platelets: 205 10*3/uL (ref 150–400)
RBC: 4.67 MIL/uL (ref 4.22–5.81)
RDW: 13.9 % (ref 11.5–15.5)
WBC: 17.8 10*3/uL — ABNORMAL HIGH (ref 4.0–10.5)
nRBC: 0 % (ref 0.0–0.2)

## 2021-03-13 LAB — LIPID PANEL
Cholesterol: 162 mg/dL (ref 0–200)
HDL: 47 mg/dL (ref >40)
LDL Cholesterol: 97 mg/dL (ref 0–99)
Total CHOL/HDL Ratio: 3.4 RATIO
Triglycerides: 92 mg/dL (ref <150)
VLDL: 18 mg/dL (ref 0–40)

## 2021-03-13 LAB — TRIGLYCERIDES: Triglycerides: 92 mg/dL (ref <150)

## 2021-03-13 MED ORDER — ASPIRIN 325 MG PO TABS
325.0000 mg | ORAL_TABLET | Freq: Every day | ORAL | Status: DC
  Administered 2021-03-13 – 2021-03-15 (×3): 325 mg
  Filled 2021-03-13 (×3): qty 1

## 2021-03-13 MED ORDER — PANTOPRAZOLE SODIUM 40 MG PO PACK
40.0000 mg | PACK | Freq: Every day | ORAL | Status: DC
  Administered 2021-03-13: 40 mg
  Filled 2021-03-13: qty 20

## 2021-03-13 MED ORDER — CHLORHEXIDINE GLUCONATE 0.12% ORAL RINSE (MEDLINE KIT)
15.0000 mL | Freq: Two times a day (BID) | OROMUCOSAL | Status: DC
  Administered 2021-03-13 – 2021-03-17 (×9): 15 mL via OROMUCOSAL

## 2021-03-13 MED ORDER — ASPIRIN 325 MG PO TABS: 325.0000 mg | ORAL_TABLET | Freq: Every day | ORAL | Status: DC

## 2021-03-13 MED ORDER — HEPARIN SODIUM (PORCINE) 5000 UNIT/ML IJ SOLN
5000.0000 [IU] | Freq: Three times a day (TID) | INTRAMUSCULAR | Status: DC
  Administered 2021-03-13 – 2021-03-15 (×5): 5000 [IU] via SUBCUTANEOUS
  Filled 2021-03-13 (×5): qty 1

## 2021-03-13 MED ORDER — CHLORHEXIDINE GLUCONATE CLOTH 2 % EX PADS
6.0000 | MEDICATED_PAD | Freq: Every day | CUTANEOUS | Status: DC
  Administered 2021-03-13 – 2021-03-15 (×3): 6 via TOPICAL

## 2021-03-13 MED ORDER — ORAL CARE MOUTH RINSE
15.0000 mL | OROMUCOSAL | Status: DC
  Administered 2021-03-13 – 2021-03-14 (×13): 15 mL via OROMUCOSAL

## 2021-03-13 MED ORDER — ATORVASTATIN CALCIUM 40 MG PO TABS
40.0000 mg | ORAL_TABLET | Freq: Every day | ORAL | Status: DC
  Administered 2021-03-14 – 2021-03-15 (×2): 40 mg
  Filled 2021-03-13 (×2): qty 1

## 2021-03-13 NOTE — Progress Notes (Signed)
LTM EEG hooked up and running - no initial skin breakdown - push button tested - neuro notified. [facility] monitoring. New leads. Routine performed 03/12/21.

## 2021-03-13 NOTE — Progress Notes (Signed)
Referring Physician(s): Stack,C  Supervising Physician: Baldemar Lenise Macedo Rodrigues, [place]  [facility]  [facility]  Chief Complaint: Right MCA stroke with left hemiplegia/seizure  Subjective: Patient currently intubated, following commands, moving right side but not left; getting EEG   Allergies: Patient has no known allergies.  Medications: Prior to Admission medications   Medication Sig Start Date End Date Taking? Authorizing Provider  ciprofloxacin (CIPRO) 500 MG tablet Take 1 tablet (500 mg total) by mouth 2 (two) times daily. 12/24/19   Croitoru, Mihai, MD  ELIQUIS 5 MG TABS tablet TAKE 1 TABLET BY MOUTH TWICE DAILY 07/07/20   Croitoru, Rachelle HoraMihai, MD  metoprolol tartrate (LOPRESSOR) 100 MG tablet Take 1 tablet by mouth twice daily 04/28/20   Azalee CourseMeng, Hao, PA  Multiple Vitamin (MULTIVITAMIN) tablet Take 1 tablet by mouth daily.    [provider]  Naproxen Sodium (ALEVE PO) Take 1 tablet by mouth. As needed    [provider]  PARoxetine (PAXIL) 20 MG tablet Take 20 mg by mouth daily.    [provider]     Vital Signs: BP 128/78   Pulse 91   Temp 99.3 F (37.4 C) (Axillary)   Resp 14   Ht 6' (1.829 m)   Wt 198 lb 6.6 oz (90 kg)   SpO2 96%   BMI 26.91 kg/m   Physical Exam intubated, responds to pain, follows commands, pupils equal round /reactive, moves right side , no movement of left upper extremity but minimal purposeful movement of left LE noted; puncture site right groin soft, clean, dry, no hematoma, 1+ distal LE pulses  Imaging: CT ANGIO HEAD NECK W WO CM  Result Date: 03/12/2021 CLINICAL DATA:  Neuro deficit.  Stroke suspected. EXAM: CT ANGIOGRAPHY HEAD AND NECK TECHNIQUE: Multidetector CT imaging of the head and neck was performed using the standard protocol during bolus administration of intravenous contrast. Multiplanar CT image reconstructions and MIPs were obtained to evaluate the vascular anatomy. Carotid stenosis measurements  (when applicable) are obtained utilizing NASCET criteria, using the distal internal carotid diameter as the denominator. CONTRAST:  75mL OMNIPAQUE IOHEXOL 350 MG/ML SOLN COMPARISON:  CT temporal bone 05/22/2011. FINDINGS: CT HEAD FINDINGS Brain: The right lentiform nucleus is slightly hypodense compared to the left. Insular ribbon is intact. No acute cortical abnormalities are present. There is a hypoattenuation involve the thalami bilaterally. No acute hemorrhage or mass lesion is present. The ventricles are of normal size. No significant extraaxial fluid collection is present. Vascular: Hyperdense right MCA noted. Atherosclerotic calcifications are present within the cavernous internal carotid arteries bilaterally. Skull: Calvarium is intact. No focal lytic or blastic lesions are present. No significant extracranial soft tissue lesion is present. Sinuses: The paranasal sinuses and mastoid air cells are clear. Orbits: The globes and orbits are within normal limits. Review of the MIP images confirms the above findings CTA NECK FINDINGS Aortic arch: Atherosclerotic calcifications are present at the origin left subclavian artery. Minimal calcifications are present otherwise. No aneurysm or stenosis is present. Right carotid system: Right common carotid artery is within normal limits. Atherosclerotic calcifications are present bifurcation proximal right ICA without significant stenosis. More distal cervical right ICA is within normal limits. Left carotid system: Left common carotid artery is within normal limits. Minimal atherosclerotic changes are noted at the bifurcation. Cervical left ICA is within normal limits. Vertebral arteries: Left vertebral artery is the dominant vessel. No significant stenosis is present in either vertebral artery. Both vertebral arteries originate from the subclavian arteries.  Skeleton: Cervical fusion noted at C6-7. Endplate changes most evident at C3-4 and C5-6. Vertebral body heights are  normal. No focal osseous lesions are present. The patient is edentulous. Other neck: The patient is intubated. Endotracheal tube in satisfactory position. Upper chest: The lung apices are clear. Thoracic inlet is within normal limits. Review of the MIP images confirms the above findings CTA HEAD FINDINGS Anterior circulation: Atherosclerotic calcifications are present within a cavernous internal carotid arteries bilaterally. No significant stenosis is present through the ICA terminus bilaterally. The A1 segments are within normal limits. Anterior communicating artery is patent. Right M1 occlusion is present with flow into a single inferior branch. Poor collaterals are present. A left MCA bifurcation is within normal limits. Left MCA branch vessels bilateral ACA branch vessels are normal. Posterior circulation: The left vertebral artery is the dominant vessel. The left PICA origin is visualized and normal. Right AICA is dominant. Basilar artery is within normal limits. Both posterior cerebral arteries originate from the basilar tip. The PCA branch vessels are within normal limits bilaterally. Venous sinuses: Dural sinuses are patent. Straight sinus deep cerebral veins are intact. Cortical veins are unremarkable. Anatomic variants: None Review of the MIP images confirms the above findings IMPRESSION: 1. Emergent large vessel occlusion of the right M1 segment with poor collaterals. 2. Subtle loss of gray-white differentiation in the right lentiform nucleus and bilateral thalami compatible with acute infarct. ASPECTS score 9/10 3. No acute hemorrhage. 4. Minimal atherosclerotic changes at the carotid bifurcations bilaterally without significant stenosis. 5. Atherosclerotic changes within the cavernous internal carotid arteries bilaterally without significant stenosis. 6. Aortic Atherosclerosis (ICD10-I70.0). These results were called by telephone at the time of interpretation on 03/12/2021 at 4:08 pm to provider Dr.  Selina Cooley, who verbally acknowledged these results. Electronically Signed   By: Marin Roberts M.D.   On: 03/12/2021 16:22   CT HEAD WO CONTRAST  Result Date: 03/12/2021 CLINICAL DATA:  Stroke follow-up EXAM: CT HEAD WITHOUT CONTRAST TECHNIQUE: Contiguous axial images were obtained from the base of the skull through the vertex without intravenous contrast. COMPARISON:  None. FINDINGS: Brain: Small amount of subarachnoid hyperdensity at the right frontal operculum, likely contrast staining. No acute hemorrhage. No midline shift or other mass effect. Vascular: No hyperdense vessel or unexpected calcification. Skull: Normal. Negative for fracture or focal lesion. Sinuses/Orbits: No acute finding. Other: None. IMPRESSION: Small amount of subarachnoid hyperdensity at the right frontal operculum, likely contrast staining. No acute hemorrhage. Electronically Signed   By: Deatra Robinson M.D.   On: 03/12/2021 22:29   MR BRAIN WO CONTRAST  Result Date: 03/13/2021 CLINICAL DATA:  Acute stroke suspected EXAM: MRI HEAD WITHOUT CONTRAST TECHNIQUE: Multiplanar, multiecho pulse sequences of the brain and surrounding structures were obtained without intravenous contrast. COMPARISON:  Head CT and CTA from yesterday FINDINGS: Brain: Confluent area of acute infarction at the right basal ganglia affecting the striatum. Tiny acute infarcts in the bilateral peripheral frontal lobes and acute or subacute infarct at the medial left thalamus. Small volume hemorrhage at the right sylvian fissure as previously seen by CT. Patchy FLAIR hyperintensity in the bilateral occipital parietal regions. No hydrocephalus or masslike finding Vascular: Major flow voids are preserved Skull and upper cervical spine: Normal marrow signal Sinuses/Orbits: Mild sinus and mastoid opacification in the setting of intubation. Other: Subcutaneous dermal inclusion cysts in the posterior midline neck. IMPRESSION: 1. Confluent acute infarct at the right basal  ganglia. Few and punctate bifrontal peripheral infarcts. 2. Recent medial left thalamic infarct  which may be subacute. 3. 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 4. Small volume blood products at the right sylvian fissure without evidence of progression from prior CT. Electronically Signed   By: Marnee Spring M.D.   On: 03/13/2021 05:08   CT CEREBRAL PERFUSION W CONTRAST  Result Date: 03/12/2021 CLINICAL DATA:  Neuro deficit, acute, stroke suspected. EXAM: CT PERFUSION BRAIN TECHNIQUE: Multiphase CT imaging of the brain was performed following IV bolus contrast injection. Subsequent parametric perfusion maps were calculated using RAPID software. CONTRAST:  38mL OMNIPAQUE IOHEXOL 350 MG/ML SOLN COMPARISON:  CTA head and neck of the same day. FINDINGS: CT Brain Perfusion Findings: CBF (<30%) Volume: 29mL Perfusion (Tmax>6.0s) volume: Mismatch Volume: ASPECTS on noncontrast CT Head: 9/10 at 3:55 p.m. today. Infarction Location:Right MCA territory. Core infarct is in the right lentiform nucleus. IMPRESSION: 1. Right MCA territory infarct with a core infarct of 11 mL and surrounding penumbra of 164 mL. 2. Core infarct is in the right lentiform nucleus. The above was relayed via text pager to Dr. Bing Neighbors on 03/12/2021 at 16:20 . Electronically Signed   By: Marin Roberts M.D.   On: 03/12/2021 16:26   DG Chest Portable 1 View  Result Date: 03/12/2021 CLINICAL DATA:  Status post intubation. EXAM: PORTABLE CHEST 1 VIEW COMPARISON:  Chest radiograph dated 08/27/2019. FINDINGS: An endotracheal tube terminates in the midthoracic trachea. The heart is enlarged. There is a minimal atelectasis or interstitial opacity in the right mid lung and the left lung base. There is no pleural effusion or pneumothorax. Degenerative changes are seen in the spine. IMPRESSION: Endotracheal tube in the midthoracic trachea. Minimal atelectasis or interstitial  opacities in the right mid lung and left lung base. Electronically Signed   By: Romona Curls M.D.   On: 03/12/2021 16:24   DG Abd Portable 1V  Result Date: 03/13/2021 CLINICAL DATA:  Screening for metal prior to MRI. EXAM: PORTABLE ABDOMEN - 1 VIEW COMPARISON:  None. FINDINGS: There is a left hip replacement. No other metallic structures in the visualized lower abdomen or pelvis. Contrast material noted within the urinary bladder. No acute bony abnormality. IMPRESSION: Prior left hip replacement. Electronically Signed   By: Charlett Nose M.D.   On: 03/13/2021 01:22    Labs:  CBC: Recent Labs    03/12/21 1540 03/12/21 1600 03/12/21 1645 03/12/21 2035 03/13/21 0241  WBC 20.6*  --   --   --  17.8*  HGB 18.6* 18.4* 16.7 17.3* 16.3  HCT 53.2* 54.0* 49.0 51.0 47.6  PLT 217  --   --   --  205    COAGS: No results for input(s): INR, APTT in the last 8760 hours.  BMP: Recent Labs    03/12/21 1540 03/12/21 1600 03/12/21 1645 03/12/21 2035 03/13/21 0241  NA 140 140 139 140 137  K 3.7 3.6 3.4* 3.8 3.8  CL 106  --   --   --  105  CO2 20*  --   --   --  20*  GLUCOSE 165*  --   --   --  133*  BUN 8  --   --   --  12  CALCIUM 9.5  --   --   --  8.8*  CREATININE 1.17  --   --   --  1.23  GFRNONAA >60  --   --   --  >60    LIVER FUNCTION TESTS: Recent Labs  03/12/21 1540  BILITOT 2.1*  AST 24  ALT 16  ALKPHOS 154*  PROT 6.9  ALBUMIN 3.7    Assessment and Plan: Patient with past medical history of atrial fibrillation, alcohol abuse with recent presentation of left hemiplegia and seizure secondary to right M1 occlusion with  acute ischemic right MCA CVA; status post mechanical thrombectomy with direct contact aspiration of the proximal right M1/MCA occlusion with complete recanalization om 9/3.  Remains intubated, afebrile, BP 142/83, heart rate 98, WBC 17.8, hemoglobin 16.3, creatinine 1.23, rt groin access site ok, MRI of the brain today-1. Confluent acute infarct at the right  basal ganglia. Few and punctate bifrontal peripheral infarcts. 2. Recent medial left thalamic infarct which may be subacute. 3. 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 4. Small volume blood products at the right sylvian fissure without evidence of progression from prior CT.  Prior CT on 9/3 with small amount of subarachnoid hyperdensity at the right frontal operculum likely contrast staining, no acute hemorrhage.  Plans as per CCM/neurology   Electronically Signed: D. Jeananne Rama, PA-C 03/13/2021, 12:50 PM   I spent a total of 20 minutes at the the patient's bedside AND on the patient's hospital floor or unit, greater than 50% of which was counseling/coordinating care for cerebral arteriogram with mechanical thrombectomy of right M1/MCA occlusion with complete recanalization    Patient ID: Patrick Padilla, male   DOB: 05-18-1956, 65 y.o.   MRN: [id number]

## 2021-03-13 NOTE — Progress Notes (Signed)
PHARMACY - PHYSICIAN COMMUNICATION CRITICAL VALUE ALERT - BLOOD CULTURE IDENTIFICATION (BCID)  Patrick Padilla is an 65 y.o. male who presented to [facility] on 03/12/2021 with a chief complaint of altered mental status / stroke.   Assessment:  WBC elevated afebrile   Name of physician (or Provider) Contacted: none  Staph epi 1/4 bottles per micro lab   Current antibiotics: none     Results for orders placed or performed during the hospital encounter of 03/12/21  Blood Culture ID Panel (Reflexed) (Collected: 03/12/2021  4:40 PM)  Result Value Ref Range   Enterococcus faecalis NOT DETECTED NOT DETECTED   Enterococcus Faecium NOT DETECTED NOT DETECTED   Listeria monocytogenes NOT DETECTED NOT DETECTED   Staphylococcus species DETECTED (A) NOT DETECTED   Staphylococcus aureus (BCID) NOT DETECTED NOT DETECTED   Staphylococcus epidermidis DETECTED (A) NOT DETECTED   Staphylococcus lugdunensis NOT DETECTED NOT DETECTED   Streptococcus species NOT DETECTED NOT DETECTED   Streptococcus agalactiae NOT DETECTED NOT DETECTED   Streptococcus pneumoniae NOT DETECTED NOT DETECTED   Streptococcus pyogenes NOT DETECTED NOT DETECTED   A.calcoaceticus-baumannii NOT DETECTED NOT DETECTED   Bacteroides fragilis NOT DETECTED NOT DETECTED   Enterobacterales NOT DETECTED NOT DETECTED   Enterobacter cloacae complex NOT DETECTED NOT DETECTED   Escherichia coli NOT DETECTED NOT DETECTED   Klebsiella aerogenes NOT DETECTED NOT DETECTED   Klebsiella oxytoca NOT DETECTED NOT DETECTED   Klebsiella pneumoniae NOT DETECTED NOT DETECTED   Proteus species NOT DETECTED NOT DETECTED   Salmonella species NOT DETECTED NOT DETECTED   Serratia marcescens NOT DETECTED NOT DETECTED   Haemophilus influenzae NOT DETECTED NOT DETECTED   Neisseria meningitidis NOT DETECTED NOT DETECTED   Pseudomonas aeruginosa NOT DETECTED NOT DETECTED   Stenotrophomonas maltophilia NOT DETECTED NOT DETECTED   Candida albicans NOT DETECTED  NOT DETECTED   Candida auris NOT DETECTED NOT DETECTED   Candida glabrata NOT DETECTED NOT DETECTED   Candida krusei NOT DETECTED NOT DETECTED   Candida parapsilosis NOT DETECTED NOT DETECTED   Candida tropicalis NOT DETECTED NOT DETECTED   Cryptococcus neoformans/gattii NOT DETECTED NOT DETECTED   Methicillin resistance mecA/C NOT DETECTED NOT DETECTED   Leota Sauers Pharm.D. CPP, BCPS Clinical Pharmacist [phone] 03/13/2021 8:03 PM

## 2021-03-13 NOTE — Progress Notes (Signed)
Inpatient Rehab Admissions Coordinator:   Per therapy recommendations, patient was screened for CIR candidacy by Megan Salon, MS, CCC-SLP ). At this time, Pt. Is limited due to BP, has only tolerated some bed mobility during PT eval.   Pt. may have potential to progress to becoming a potential CIR candidate, so CIR admissions team will follow and monitor for progress and participation with therapies and place consult order if Pt. appears to be an appropriate candidate. Please contact me with any questions.   Megan Salon, MS, CCC-SLP Rehab Admissions Coordinator  [phone] (celll) [phone] (office)

## 2021-03-13 NOTE — Progress Notes (Addendum)
STROKE TEAM PROGRESS NOTE    BRIEF HPI: 65 yo patient with hx EtOH abuse and a fib (unclear if on [facility]) who presented to ED with L hemiplegia and R sided jerking after attempted cardioversion by EMS for a fib with RVR. On arrival to ED per EDP patient was hemiplegic on the left with rhythmic jerking RUE. Intubated for respiratory failure with paralytics on board. Outside window for TNK. CTH possible subtle loss of gray-white differentiation in R lentiform nucleus and bilateral thalami ASPECTS 9/10. CTA showed R M1 occlusion. CTP showed R MCA infarct core 52m with penumbra of 1620m   INTERVAL HISTORY: No visitors at bedside. CCU team has weaned off sedation in the past hour. Also off cleviprex. Patient is briefly opening eyes and  following some commands on the right.  Seen with bedside RN,Chase and  Dr. XuErlinda Hong Attempted to reach son by phone but no answer. Per ChCheri RousRN, son reports patient drinks about 1 quart of whiskey per day however his ETOH level as less than 10 upon arrival.   Vitals:   03/13/21 1100 03/13/21 1200 03/13/21 1230 03/13/21 1300  BP: 130/76 133/77 128/78 133/73  Pulse: 98 96 91 91  Resp: _0 Temp:  99.3 F (37.4 C)    TempSrc:  Axillary    SpO2: 100% 97% 96% 96%  Weight:      Height:       CBC:  Recent Labs  Lab 03/12/21 1540 03/12/21 1600 03/12/21 2035 03/13/21 0241  WBC 20.6*  --   --  17.8*  NEUTROABS 18.2*  --   --   --   HGB 18.6*   < > 17.3* 16.3  HCT 53.2*   < > 51.0 47.6  MCV 100.0  --   --  101.9*  PLT 217  --   --  205   < > = values in this interval not displayed.   Basic Metabolic Panel:  Recent Labs  Lab 03/12/21 1540 03/12/21 1600 03/12/21 2035 03/13/21 0241  NA 140   < > 140 137  K 3.7   < > 3.8 3.8  CL 106  --   --  105  CO2 20*  --   --  20*  GLUCOSE 165*  --   --  133*  BUN 8  --   --  12  CREATININE 1.17  --   --  1.23  CALCIUM 9.5  --   --  8.8*   < > = values in this interval not displayed.   Lipid Panel:  Recent  Labs  Lab 03/13/21 0241  CHOL 162  TRIG 92  92  HDL 47  CHOLHDL 3.4  VLDL 18  LDLCALC 97   HgbA1c: No results for input(s): HGBA1C in the last 168 hours. Urine Drug Screen: No results for input(s): LABOPIA, COCAINSCRNUR, LABBENZ, AMPHETMU, THCU, LABBARB in the last 168 hours.  Alcohol Level  Recent Labs  Lab 03/12/21 1540  ETH <10   IMAGING past 24 hours CT ANGIO HEAD NECK W WO CM  Result Date: 03/12/2021 CLINICAL DATA:  Neuro deficit.  Stroke suspected. EXAM: CT ANGIOGRAPHY HEAD AND NECK TECHNIQUE: Multidetector CT imaging of the head and neck was performed using the standard protocol during bolus administration of intravenous contrast. Multiplanar CT image reconstructions and MIPs were obtained to evaluate the vascular anatomy. Carotid stenosis measurements (when applicable) are obtained utilizing NASCET criteria, using the distal internal carotid diameter as the denominator. CONTRAST:  110m OMNIPAQUE IOHEXOL 350 MG/ML SOLN COMPARISON:  CT temporal bone 05/22/2011. FINDINGS: CT HEAD FINDINGS Brain: The right lentiform nucleus is slightly hypodense compared to the left. Insular ribbon is intact. No acute cortical abnormalities are present. There is a hypoattenuation involve the thalami bilaterally. No acute hemorrhage or mass lesion is present. The ventricles are of normal size. No significant extraaxial fluid collection is present. Vascular: Hyperdense right MCA noted. Atherosclerotic calcifications are present within the cavernous internal carotid arteries bilaterally. Skull: Calvarium is intact. No focal lytic or blastic lesions are present. No significant extracranial soft tissue lesion is present. Sinuses: The paranasal sinuses and mastoid air cells are clear. Orbits: The globes and orbits are within normal limits. Review of the MIP images confirms the above findings CTA NECK FINDINGS Aortic arch: Atherosclerotic calcifications are present at the origin left subclavian artery. Minimal  calcifications are present otherwise. No aneurysm or stenosis is present. Right carotid system: Right common carotid artery is within normal limits. Atherosclerotic calcifications are present bifurcation proximal right ICA without significant stenosis. More distal cervical right ICA is within normal limits. Left carotid system: Left common carotid artery is within normal limits. Minimal atherosclerotic changes are noted at the bifurcation. Cervical left ICA is within normal limits. Vertebral arteries: Left vertebral artery is the dominant vessel. No significant stenosis is present in either vertebral artery. Both vertebral arteries originate from the subclavian arteries. Skeleton: Cervical fusion noted at C6-7. Endplate changes most evident at C3-4 and C5-6. Vertebral body heights are normal. No focal osseous lesions are present. The patient is edentulous. Other neck: The patient is intubated. Endotracheal tube in satisfactory position. Upper chest: The lung apices are clear. Thoracic inlet is within normal limits. Review of the MIP images confirms the above findings CTA HEAD FINDINGS Anterior circulation: Atherosclerotic calcifications are present within a cavernous internal carotid arteries bilaterally. No significant stenosis is present through the ICA terminus bilaterally. The A1 segments are within normal limits. Anterior communicating artery is patent. Right M1 occlusion is present with flow into a single inferior branch. Poor collaterals are present. A left MCA bifurcation is within normal limits. Left MCA branch vessels bilateral ACA branch vessels are normal. Posterior circulation: The left vertebral artery is the dominant vessel. The left PICA origin is visualized and normal. Right AICA is dominant. Basilar artery is within normal limits. Both posterior cerebral arteries originate from the basilar tip. The PCA branch vessels are within normal limits bilaterally. Venous sinuses: Dural sinuses are patent.  Straight sinus deep cerebral veins are intact. Cortical veins are unremarkable. Anatomic variants: None Review of the MIP images confirms the above findings IMPRESSION: 1. Emergent large vessel occlusion of the right M1 segment with poor collaterals. 2. Subtle loss of gray-white differentiation in the right lentiform nucleus and bilateral thalami compatible with acute infarct. ASPECTS score 9/10 3. No acute hemorrhage. 4. Minimal atherosclerotic changes at the carotid bifurcations bilaterally without significant stenosis. 5. Atherosclerotic changes within the cavernous internal carotid arteries bilaterally without significant stenosis. 6. Aortic Atherosclerosis (ICD10-I70.0). These results were called by telephone at the time of interpretation on 03/12/2021 at 4:08 pm to provider Dr. SQuinn Axe who verbally acknowledged these results. Electronically Signed   By: CSan MorelleM.D.   On: 03/12/2021 16:22   CT HEAD WO CONTRAST  Result Date: 03/12/2021 CLINICAL DATA:  Stroke follow-up EXAM: CT HEAD WITHOUT CONTRAST TECHNIQUE: Contiguous axial images were obtained from the base of the skull through the vertex without intravenous contrast. COMPARISON:  None. FINDINGS:  Brain: Small amount of subarachnoid hyperdensity at the right frontal operculum, likely contrast staining. No acute hemorrhage. No midline shift or other mass effect. Vascular: No hyperdense vessel or unexpected calcification. Skull: Normal. Negative for fracture or focal lesion. Sinuses/Orbits: No acute finding. Other: None. IMPRESSION: Small amount of subarachnoid hyperdensity at the right frontal operculum, likely contrast staining. No acute hemorrhage. Electronically Signed   By: Ulyses Jarred M.D.   On: 03/12/2021 22:29   MR BRAIN WO CONTRAST  Result Date: 03/13/2021 CLINICAL DATA:  Acute stroke suspected EXAM: MRI HEAD WITHOUT CONTRAST TECHNIQUE: Multiplanar, multiecho pulse sequences of the brain and surrounding structures were obtained  without intravenous contrast. COMPARISON:  Head CT and CTA from yesterday FINDINGS: Brain: Confluent area of acute infarction at the right basal ganglia affecting the striatum. Tiny acute infarcts in the bilateral peripheral frontal lobes and acute or subacute infarct at the medial left thalamus. Small volume hemorrhage at the right sylvian fissure as previously seen by CT. Patchy FLAIR hyperintensity in the bilateral occipital parietal regions. No hydrocephalus or masslike finding Vascular: Major flow voids are preserved Skull and upper cervical spine: Normal marrow signal Sinuses/Orbits: Mild sinus and mastoid opacification in the setting of intubation. Other: Subcutaneous dermal inclusion cysts in the posterior midline neck. IMPRESSION: 1. Confluent acute infarct at the right basal ganglia. Few and punctate bifrontal peripheral infarcts. 2. Recent medial left thalamic infarct which may be subacute. 3. 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 4. Small volume blood products at the right sylvian fissure without evidence of progression from prior CT. Electronically Signed   By: Monte Fantasia M.D.   On: 03/13/2021 05:08   CT CEREBRAL PERFUSION W CONTRAST  Result Date: 03/12/2021 CLINICAL DATA:  Neuro deficit, acute, stroke suspected. EXAM: CT PERFUSION BRAIN TECHNIQUE: Multiphase CT imaging of the brain was performed following IV bolus contrast injection. Subsequent parametric perfusion maps were calculated using RAPID software. CONTRAST:  17mL OMNIPAQUE IOHEXOL 350 MG/ML SOLN COMPARISON:  CTA head and neck of the same day. FINDINGS: CT Brain Perfusion Findings: CBF (<30%) Volume: 39mL Perfusion (Tmax>6.0s) volume: 122mL Mismatch Volume: 116mL ASPECTS on noncontrast CT Head: 9/10 at 3:55 p.m. today. Infarction Location:Right MCA territory. Core infarct is in the right lentiform nucleus. IMPRESSION: 1. Right MCA territory infarct with a core infarct of 11 mL  and surrounding penumbra of 164 mL. 2. Core infarct is in the right lentiform nucleus. The above was relayed via text pager to Dr. Su Monks on 03/12/2021 at 16:20 . Electronically Signed   By: San Morelle M.D.   On: 03/12/2021 16:26   DG Chest Portable 1 View  Result Date: 03/12/2021 CLINICAL DATA:  Status post intubation. EXAM: PORTABLE CHEST 1 VIEW COMPARISON:  Chest radiograph dated 08/27/2019. FINDINGS: An endotracheal tube terminates in the midthoracic trachea. The heart is enlarged. There is a minimal atelectasis or interstitial opacity in the right mid lung and the left lung base. There is no pleural effusion or pneumothorax. Degenerative changes are seen in the spine. IMPRESSION: Endotracheal tube in the midthoracic trachea. Minimal atelectasis or interstitial opacities in the right mid lung and left lung base. Electronically Signed   By: Zerita Boers M.D.   On: 03/12/2021 16:24   DG Abd Portable 1V  Result Date: 03/13/2021 CLINICAL DATA:  Screening for metal prior to MRI. EXAM: PORTABLE ABDOMEN - 1 VIEW COMPARISON:  None. FINDINGS: There is a left hip replacement. No other metallic structures in the visualized lower abdomen or  pelvis. Contrast material noted within the urinary bladder. No acute bony abnormality. IMPRESSION: Prior left hip replacement. Electronically Signed   By: Rolm Baptise M.D.   On: 03/13/2021 01:22    PHYSICAL EXAM:  Temp:  [98.4 F (36.9 C)-99.7 F (37.6 C)] 99.3 F (37.4 C) (09/04 1200) Pulse Rate:  [81-154] 81 (09/04 1500) Resp:  [11-25] 12 (09/04 1500) BP: (88-171)/(65-116) 133/74 (09/04 1500) SpO2:  [94 %-100 %] 96 % (09/04 1500) FiO2 (%):  [40 %-100 %] 40 % (09/04 1519) Weight:  [90 kg] 90 kg (09/03 1540)  General - Critically ill elderly male intubated and sedated (weaning off) lying in bed in NAD Resp: Intubated, resp even  Cardiovascular - Regular rhythm and rate. No afib at present on tele.  Abdominal-large round soft abdomen Skin: warm  and dry  Ext: No edema  Mental Status -  Brief eye opening to voice command. Follows some commands RUE and RLE.   Cranial Nerves II - XII - Blinks to threat on right, minimal blink to threat on left. + Right gaze preference.  Does not track examiner Intubation prohibits facial symmetry assessment. Tongue protrusion intact to command.  Motor Strength - Antigravity in the right hemibody, Does not follow commands for grip testing or foot assessment. LUE with positional tremor, + withdrawal LUE and LLE.   Sensory - Unable to assess due to AMS  Coordination - unable to assess due to asses to AMS  ASSESSMENT/PLAN: 65 yo patient with hx EtOH abuse and a fib (unclear if on [facility]) who presented to ED with L hemiplegia and R sided jerking after attempted cardioversion by EMS for a fib with RVR.  Per report, patient last seen well when he went to bed at 10pm 9/2. Woke up 9/3 morning confused. Developed diaphoresis and chest discomfort in the afternoon. Called EMS who stated patient was in a fib with RVR. Attempted cardioversion en route. On arrival to ED per ED patient was hemiplegic on the left with rhythmic jerking RUE. He was intubated with rocuronium for acute hypoxic respiratory failure. EDP contacted neurology. Code Stroke activated while he was in the CT scan. He remained paralyzed inhibiting performance of a neurologic exam or NIHSS. Outside window for TNK. CTH showed possible subtle loss of gray-white differentiation in R lentiform nucleus and bilateral thalami ASPECTS 9/10. CTA showed  emergent R M1 occlusion. CTP showed R MCA infarct core 8m with penumbra of 1615m Family was unreachable despite multiple attempts. 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  CT Possible subtle loss of gray-white differentiation in R  lentiform nucleus and bilateral thalami  CTA head & neck Emergent LVO of the right M1 segment with poor collaterals. CT perfusion: R MCA infarct core 114mith penumbra of 165m52mR 9/3 Proximal right M1/MCA occlusion s/p mechanical thrombectomy with TICI3) MRI Confluent acute infarct at the right basal ganglia. Few and punctate bifrontal peripheral infarcts. Recent medial left thalamic infarct which may be subacute. PRES appearance in the bilateral parietooccipital cortex. Small volume blood products at the right sylvian fissure without evidence of progression from prior CT. 2D Echo Pending EEG spot: no seizure or seizure predisposition recorded on this study.   LTM EEG Pending LDL 97 HgbA1c 5.2 VTE prophylaxis - heparin subq Unclear if taking Eliquis prior to admission, currently on ASA 325mg51m  daily given moderate sized stroke with petechial hemorrhage.   Therapy recommendations:  Pending  Disposition:  TBD Son updated by phone on plan of care. Questions answered. He did report patient drinks whiskey daily and smokes heavily. He does see patient taking medications but does not help with them. We discussed diagnostic findings and plan of care. Questions answered.   Atrial fibrillation S/p attempted cardioversion by EMS Rate controlled, now in NSR Unclear compliance with home Eliquis: pharmacy tech noted that the son does not know when the last eliquis was taken. There was a bottle in the bag of medications brought in by son with only one pill left but the label was smeared so the fill date couldn't be seen. The last time the med was filled per the [facility] was December 2021.  Now on ASA daily given moderate sized stroke with petechial hemorrhage.    PRES Severe Hypertension B/p 220s/120s on presentation Home meds:  Micardis Cleviprex weaned off this am  24h post IR BP goal <180/105 Stable now Long-term BP goal normotensive  Right arm shaking ? Rhythmic jerking RUE on  admission More consistent with postural tremor this am EEG spot - no seizure LTM EEG pending On keppra, will continue for now  Hyperlipidemia Home meds:  None LDL 97, goal < 70 Lipitor 40 stated  Continue statin at discharge  Acute respiratory failure  Remains intubated Weaning sedation  CCM managing and appreciated   Leukocytosis WBC 20.6->17.8, monitor  Afebrile, monitor fever curve  Abd xray no acute findings CXR: Minimal atelectasis or interstitial opacities in the right mid lung and left lung base.  Feeding/swallow Status NPO Consider starting TF tomorrow if unable to be extubated/pass swallow testing MIVF NS at 75 cc/hr.   History of ETOH abuse Son reports daily intake of one quart of whiskey alcohol level less than 10 on admission Ammonia wnl, AST wnl, Alk phos 154, Total Bilirubin 2.1. Trig <150 Check thiamine level CIWA if needed   Tobacco abuse Current heavy smoker Smoking cessation counseling will be provided  Other Stroke Risk Factors Advanced Age >/= 29  High risk for Obstructive sleep apnea  Other Active Problems  Hospital day # 1 This plan of care was directed by Dr. Stefanie Libel, NP-C  ATTENDING NOTE: I reviewed above note and agree with the assessment and plan. Pt was seen and examined.   65 year old male with history of A. fib on Eliquis not sure about compliance, alcohol abuse, heavy smoker admitted for confusion, diaphoresis, chest pain found to have A. fib RVR.  EMS attempted cardioversion but then patient developed left hemiplegia, right arm jerking/tremor.  CT showed right frontal early ischemic changes.  CTA head and neck right M1 occlusion.  Status post thrombectomy with TICI3 reperfusion.  EEG no seizure, patient put on Keppra.  MRI showed right BG/CR large infarct, right punctate frontal infarcts with petechial hemorrhage.  EF 55 to 60% in 01/2019 as well as TEE no PFO but ASA.  Repeat 2D echo pending.  LDL 97, A1c pending.   Long-term EEG pending.  WBC 12.6.  On exam, patient not in A. fib, intubated just off propofol, eyes slowly open with voice and pain, needed repetitive stimulation to keep eye half way open, able to follow most simple commands on the right hand and b/l toes. With eye opening, eyes in right gaze preference position, inconsistentl blinking to visual threat bilaterally, PERRL. Corneal reflex present, gag and cough present. Breathing over the vent.  Facial symmetry not able to test due to ET tube.  Tongue protrusion not cooperative. Able to raise right arm against gravity, slight withdraw on the left UE. RLE 3-/5 and LLE 2-/5 on pain stimulation. DTR 1+ and no babinski. Sensation, coordination not cooperative and gait not tested. Right arm postural tremor.   Etiology for patient stroke likely due to A. fib with unclear compliance vs status post cardioversion.  Right arm jerking movement since likely postural tremor instead of seizure-like activity.  Pending long-term EEG to confirm.  Extubate as able, discussed with Dr. Lamonte Sakai CCM.  Given moderate sized stroke and petechial hemorrhage, continue aspirin for now.  Continue Lipitor.  Continue IV fluid, will decide on tube feeding depend on respiratory status tomorrow.  BP goal less than 180/105 24 hours after IR.  Patient is heavy smoker and heavy drinker PTA, seizure precaution, CIWA protocol.  For detailed assessment and plan, please refer to above as I have made changes wherever appropriate.   This patient is critically ill due to right MCA stroke, right MCA occlusion status post thrombectomy, A. fib status post cardioversion, hypertensive urgency, heavy smoker and alcohol abuser and at significant risk of neurological worsening, death form recurrent stroke, hemorrhagic conversion, heart failure, seizure, alcohol withdrawal, DT. This patient's care requires constant monitoring of vital signs, hemodynamics, respiratory and cardiac monitoring, review of multiple  databases, neurological assessment, discussion with family, other specialists and medical decision making of high complexity. I spent 45 minutes of neurocritical care time in the care of this patient.  I discussed with Dr. Lamonte Sakai CCM.  Rosalin Hawking, MD PhD Stroke Neurology 03/13/2021 6:35 PM    To contact Stroke Continuity provider, please refer to [email or web address] After hours, contact General Neurology

## 2021-03-13 NOTE — Progress Notes (Signed)
SLP Cancellation Note  Patient Details Name: Ben Habermann MRN: [id number] DOB: [birth year]   Cancelled treatment:       Reason Eval/Treat Not Completed: Medical issues which prohibited therapy (Pt is currently intubated. SLP will follow up.)  Kato Wieczorek I. Vear Clock, MS, CCC-SLP Acute Rehabilitation Services Office number [phone] Pager [phone]  Scheryl Marten 03/13/2021, 9:16 AM

## 2021-03-13 NOTE — Evaluation (Addendum)
Physical Therapy Evaluation Patient Details Name: Patrick Padilla MRN: [id number] DOB: 17-Feb-1956 Today's Date: 03/13/2021   History of Present Illness  65 yo patient who presented to ED with L hemiplegia and R sided jerking after attempted cardioversion by EMS for afib with RVR. PMH:  hx EtOH abuse and  afib (unclear if on [facility]), anxiety HTN PSH: cardioversion, L THA   Clinical Impression  Pt  admitted with above. Pt following commands with R UE and LE only, unable to transfer to EOB today due to not being able to stay in BP parameters, pt on vent, and continuous EEG. Pt would open eyes to commands but had a hard time focusing on person. Pt with noted increased tone/stiffness in L UE and LE. Pt actively moving R UE to reach for vent tube. Pt was indep prior to admit. Recommend CIR upon d/c for maximal functional recovery.    Follow Up Recommendations CIR    Equipment Recommendations   (TBD at next venue)    Recommendations for Other Services Rehab consult     Precautions / Restrictions Precautions Precautions: Fall Precaution Comments: on continuous EEG, vent Restrictions Weight Bearing Restrictions: No      Mobility  Bed Mobility               General bed mobility comments: utilized egress to position pt and complete low level PT exam as pt on vent and with continuous EEG. Pt's BP increased from 119 systolic to 151 systolic with HOB up at 60 deg elevation, with range being 120-140 systolic, gave pt 5 min to acclimate, BP down to 141 systolic, pt then reclined to 50 deg and BP dec to 133 systolic. RN aware.    Transfers                 General transfer comment: unsafe at this time  Ambulation/Gait             General Gait Details: unable  Stairs            Wheelchair Mobility    Modified Rankin (Stroke Patients Only) Modified Rankin (Stroke Patients Only) Pre-Morbid Rankin Score: No significant disability Modified Rankin: Severe disability      Balance Overall balance assessment: Needs assistance (no officially test as only used egress position but suspect pt with have both sitting and standing impaired balance)                                           Pertinent Vitals/Pain Pain Assessment: Faces Faces Pain Scale: No hurt    Home Living Family/patient expects to be discharged to:: Private residence Living Arrangements: Children (son, dtr-in-law and grandchild) Available Help at Discharge: Family;Available 24 hours/day (dtr in law always home) Type of Home: House Home Access: Stairs to enter Entrance Stairs-Rails: Left Entrance Stairs-Number of Steps: 3 Home Layout: One level Home Equipment: None      Prior Function Level of Independence: Independent         Comments: per son pt was sedentary and spent most of his days in a recliner however was indep in sense of amb and ADLs just didn't do much at baseline     Hand Dominance        Extremity/Trunk Assessment   Upper Extremity Assessment Upper Extremity Assessment: RUE deficits/detail;LUE deficits/detail RUE Deficits / Details: pt using R UE purposefully to reach for  vent tube LUE Deficits / Details: no active movement, appears to have increased tone when completing ROM    Lower Extremity Assessment Lower Extremity Assessment: RLE deficits/detail;LLE deficits/detail RLE Deficits / Details: pt attempted to kick and wiggle toes to command LLE Deficits / Details: no active movement, appearead to have increased tone when attempting to perform PROM    Cervical / Trunk Assessment Cervical / Trunk Assessment: Other exceptions (head with R rotation preference, away from vent, resistance when attempting to turn head to the L)  Communication   Communication:  (intubated)  Cognition Arousal/Alertness: Lethargic Behavior During Therapy: Flat affect Overall Cognitive Status: Impaired/Different from baseline                                  General Comments: pt on vent and continuous EEG, no longer on sedation, pt was able to follow simple commands consistently with R UE and LE however did not move the L UE/LE, pt did attempt to turn head/left to see who was talking about 50% of time      General Comments General comments (skin integrity, edema, etc.): watch BP, range 120-140, pt edematous in bilat hands    Exercises Other Exercises Other Exercises: PROM completed to bilat UE and LEs, noted increased tone in L UE and LE   Assessment/Plan    PT Assessment Patient needs continued PT services  PT Problem List Decreased strength;Decreased range of motion;Decreased activity tolerance;Decreased balance;Decreased mobility;Decreased coordination;Decreased cognition;Decreased knowledge of use of DME;Decreased safety awareness       PT Treatment Interventions DME instruction;Gait training;Stair training;Functional mobility training;Therapeutic activities;Therapeutic exercise;Balance training;Neuromuscular re-education    PT Goals (Current goals can be found in the Care Plan section)  Acute Rehab PT Goals Patient Stated Goal: unable to state PT Goal Formulation: With patient/family Time For Goal Achievement: 03/27/21 Potential to Achieve Goals: Fair    Frequency Min 3X/week (until pt extubated)   Barriers to discharge        Co-evaluation               AM-PAC PT "6 Clicks" Mobility  Outcome Measure Help needed turning from your back to your side while in a flat bed without using bedrails?: Total Help needed moving from lying on your back to sitting on the side of a flat bed without using bedrails?: Total Help needed moving to and from a bed to a chair (including a wheelchair)?: Total Help needed standing up from a chair using your arms (e.g., wheelchair or bedside chair)?: Total Help needed to walk in hospital room?: Total Help needed climbing 3-5 steps with a railing? : Total 6 Click Score: 6    End  of Session   Activity Tolerance: Patient limited by fatigue;Patient limited by lethargy (limited by BP being higher than parameters) Patient left: in bed;with call bell/phone within reach;with nursing/sitter in room (with HOB at 50 deg) Nurse Communication: Mobility status (BPs) PT Visit Diagnosis: Unsteadiness on feet (R26.81);Difficulty in walking, not elsewhere classified (R26.2)    Time: 1137-1200 PT Time Calculation (min) (ACUTE ONLY): 23 min   Charges:   PT Evaluation $PT Eval Moderate Complexity: 1 Mod PT Treatments $Therapeutic Exercise: 8-22 mins        Lewis Shock, PT, DPT Acute Rehabilitation Services Pager #: [phone] Office #: [phone]   Iona Hansen 03/13/2021, 2:50 PM

## 2021-03-13 NOTE — Progress Notes (Signed)
NAME:  Patrick Padilla, MRN:  [id number], DOB:  1955-11-12, LOS: 1 ADMISSION DATE:  03/12/2021, CONSULTATION DATE:  9/3 REFERRING MD:  Dr Selina Cooley, CHIEF COMPLAINT:  acute resp failure   History of Present Illness:  65 year old man history of tobacco use (?amount), hypertension, alcohol abuse, atrial fibrillation on Eliquis (unclear compliance).  Last seen normal 22:00 9/2, noted with altered mental status, confusion morning 9/3.  Then evolved chest discomfort diaphoresis.  EMS activated.  Some report of questionable seizure activity although unclear timing.  Found to be tachycardic, question atrial fibrillation plus RVR.  Cardioversion was attempted in the field.  On arrival to the ED patient had left hemiplegia with rhythmic jerking of the right upper extremity.  He required intubation and ventilation for impaired airway protection and hypoxemia.  TNK deferred, unclear timing window for symptom onset.  CT/angio showed possible infarct right lentiform nucleus and bilateral thalami, right M1 occlusion, right MCA infarct.  No family available.  He has been seen by neurology and is going urgently for neuro IR intervention, sedated and intubated.  Pertinent  Medical History   Past Medical History:  Diagnosis Date   Anxiety    Elevated LFTs    History of colon polyps    HLD (hyperlipidemia)    HTN (hypertension)    Localized superficial swelling, mass, or lump     Significant Hospital Events: Including procedures, antibiotic start and stop dates in addition to other pertinent events   CT/angio Head >> large vessel occlusion right M1 segment, evolving acute infarct right lentiform nucleus, bilateral thalami, MCA territory.  No evidence of an acute hemorrhage.  Minimal atherosclerotic changes bilateral carotids. Head CT 9/3 >> small amount of subarachnoid hypodensity right frontal, question contrast staining, no acute hemorrhage MRI brain 9/4 > confluent acute right basal ganglia infarct, few punctate  bifrontal peripheral infarcts, subacute left thalamic infarct, bilateral paraseptal cortex hyperintensity, consider PRES.  Small volume blood products right sylvian fissure, no progression  Interim History / Subjective:  I/O 650 cc total Propofol 30 >> 10 Phenylephrine off 0.60, PEEP 5  Objective   Blood pressure 111/65, pulse 94, temperature 99.6 F (37.6 C), temperature source Oral, resp. rate 18, height 6' (1.829 m), weight 90 kg, SpO2 95 %.    Vent Mode: PRVC FiO2 (%):  [60 %-100 %] 60 % Set Rate:  [18 bmp] 18 bmp Vt Set:  [620 mL] 620 mL PEEP:  [5 cmH20] 5 cmH20 Plateau Pressure:  [19 cmH20-22 cmH20] 19 cmH20   Intake/Output Summary (Last 24 hours) at 03/13/2021 0723 Last data filed at 03/13/2021 0700 Gross per 24 hour  Intake 1309.56 ml  Output 655 ml  Net 654.56 ml   Filed Weights   03/12/21 1540  Weight: 90 kg    Examination: General: Obese man, intubated, no evidence of distress HENT: ET tube in place, pupils equal and react Lungs: Distant, clear, no wheeze or crackles Cardiovascular: Regular, distant, no murmur Abdomen: Obese, nondistended positive bowel sounds Extremities: No edema Neuro: Sedated.  Does try to open his eyes to voice, will intermittently follow some commands on the right but not reliably.  Not moving on the left.  Propofol 10  Resolved Hospital Problem list     Assessment & Plan:  Acute right lentiform nucleus, MCA territory CVA Question PRES based on MRI 9/4 -Blood pressure goal 120-140 systolic.  Cleviprex off.  May need to restart depending on BP once sedation lifted -Aspirin and anticoagulation as per neurology plans -Seizure precautions.  On Keppra -PT, OT, SLP  Acute respiratory failure, principally due to encephalopathy.  Consider contribution of hemodynamic instability in the setting of tachycardia -PRVC 8 cc/kg.  We will push for PSV 9/4 as sedation is lightened.  Hopefully he will be a candidate for extubation -Pulmonary  hygiene -VAP prevention orders  Encephalopathy, need for sedation -Plan to stop propofol 9/4 to follow mental status  Atrial fibrillation with RVR -Unclear whether he was compliant with his Eliquis as an outpatient.  Defer anticoagulation timing to neurology post procedure -Rate controlled metoprolol  Hypertension -Home meds on hold for now  Hx EtOH use -No current evidence of withdrawal, watch for any evolving symptoms.    Best Practice (right click and "Reselect all SmartList Selections" daily)   Diet/type: NPO DVT prophylaxis: SCD GI prophylaxis: PPI Lines: N/A Foley:  Yes, and it is still needed Code Status:  full code Last date of multidisciplinary goals of care discussion [pending]  Labs   CBC: Recent Labs  Lab 03/12/21 1540 03/12/21 1600 03/12/21 1645 03/12/21 2035  WBC 20.6*  --   --   --   NEUTROABS 18.2*  --   --   --   HGB 18.6* 18.4* 16.7 17.3*  HCT 53.2* 54.0* 49.0 51.0  MCV 100.0  --   --   --   PLT 217  --   --   --     Basic Metabolic Panel: Recent Labs  Lab 03/12/21 1540 03/12/21 1600 03/12/21 1645 03/12/21 2035  NA 140 140 139 140  K 3.7 3.6 3.4* 3.8  CL 106  --   --   --   CO2 20*  --   --   --   GLUCOSE 165*  --   --   --   BUN 8  --   --   --   CREATININE 1.17  --   --   --   CALCIUM 9.5  --   --   --    GFR: Estimated Creatinine Clearance: 69.1 mL/min (by C-G formula based on SCr of 1.17 mg/dL). Recent Labs  Lab 03/12/21 1540  WBC 20.6*  LATICACIDVEN 3.7*    Liver Function Tests: Recent Labs  Lab 03/12/21 1540  AST 24  ALT 16  ALKPHOS 154*  BILITOT 2.1*  PROT 6.9  ALBUMIN 3.7   No results for input(s): LIPASE, AMYLASE in the last 168 hours. Recent Labs  Lab 03/12/21 1540  AMMONIA 31    ABG    Component Value Date/Time   PHART 7.336 (L) 03/12/2021 2035   PCO2ART 40.1 03/12/2021 2035   PO2ART 78 (L) 03/12/2021 2035   HCO3 21.5 03/12/2021 2035   TCO2 23 03/12/2021 2035   ACIDBASEDEF 4.0 (H) 03/12/2021  2035   O2SAT 95.0 03/12/2021 2035     Coagulation Profile: No results for input(s): INR, PROTIME in the last 168 hours.  Cardiac Enzymes: Recent Labs  Lab 03/12/21 1546  CKTOTAL 130    HbA1C: Hgb A1c MFr Bld  Date/Time Value Ref Range Status  02/04/2019 03:16 AM 5.2 4.8 - 5.6 % Final    Comment:    (NOTE) Pre diabetes:          5.7%-6.4% Diabetes:              >6.4% Glycemic control for   <7.0% adults with diabetes     CBG: No results for input(s): GLUCAP in the last 168 hours.    Critical care time: 32 minutes  Levy Pupa, MD, PhD 03/13/2021, 7:23 AM  Pulmonary and Critical Care [phone] or if no answer before 7:00PM call [phone] For any issues after 7:00PM please call eLink [phone]

## 2021-03-13 NOTE — Progress Notes (Signed)
Pt transported to and from MRI w/o event. 

## 2021-03-14 ENCOUNTER — Inpatient Hospital Stay (HOSPITAL_COMMUNITY): Payer: Medicare Other

## 2021-03-14 ENCOUNTER — Encounter (HOSPITAL_COMMUNITY): Payer: Self-pay | Admitting: Radiology

## 2021-03-14 DIAGNOSIS — I4891 Unspecified atrial fibrillation: Secondary | ICD-10-CM | POA: Diagnosis not present

## 2021-03-14 DIAGNOSIS — I63511 Cerebral infarction due to unspecified occlusion or stenosis of right middle cerebral artery: Secondary | ICD-10-CM

## 2021-03-14 DIAGNOSIS — R569 Unspecified convulsions: Secondary | ICD-10-CM | POA: Diagnosis not present

## 2021-03-14 DIAGNOSIS — R4182 Altered mental status, unspecified: Secondary | ICD-10-CM | POA: Diagnosis not present

## 2021-03-14 DIAGNOSIS — J9601 Acute respiratory failure with hypoxia: Secondary | ICD-10-CM | POA: Diagnosis not present

## 2021-03-14 LAB — BASIC METABOLIC PANEL
Anion gap: 16 — ABNORMAL HIGH (ref 5–15)
BUN: 22 mg/dL (ref 8–23)
CO2: 21 mmol/L — ABNORMAL LOW (ref 22–32)
Calcium: 8.8 mg/dL — ABNORMAL LOW (ref 8.9–10.3)
Chloride: 103 mmol/L (ref 98–111)
Creatinine, Ser: 1.37 mg/dL — ABNORMAL HIGH (ref 0.61–1.24)
GFR, Estimated: 57 mL/min — ABNORMAL LOW (ref >60)
Glucose, Bld: 88 mg/dL (ref 70–99)
Potassium: 3.6 mmol/L (ref 3.5–5.1)
Sodium: 140 mmol/L (ref 135–145)

## 2021-03-14 LAB — CBC
HCT: 40.9 % (ref 39.0–52.0)
Hemoglobin: 13.7 g/dL (ref 13.0–17.0)
MCH: 34.6 pg — ABNORMAL HIGH (ref 26.0–34.0)
MCHC: 33.5 g/dL (ref 30.0–36.0)
MCV: 103.3 fL — ABNORMAL HIGH (ref 80.0–100.0)
Platelets: 188 10*3/uL (ref 150–400)
RBC: 3.96 MIL/uL — ABNORMAL LOW (ref 4.22–5.81)
RDW: 13.9 % (ref 11.5–15.5)
WBC: 16.7 10*3/uL — ABNORMAL HIGH (ref 4.0–10.5)
nRBC: 0 % (ref 0.0–0.2)

## 2021-03-14 LAB — ECHOCARDIOGRAM COMPLETE
AR max vel: 3.35 cm2
AV Area VTI: 3.65 cm2
AV Area mean vel: 3.33 cm2
AV Mean grad: 2 mmHg
AV Peak grad: 4.8 mmHg
Ao pk vel: 1.09 m/s
Height: 72 in
MV VTI: 3.01 cm2
S' Lateral: 3.3 cm
Weight: 3174.62 oz

## 2021-03-14 LAB — CULTURE, BLOOD (ROUTINE X 2): Special Requests: ADEQUATE

## 2021-03-14 LAB — MAGNESIUM: Magnesium: 2.2 mg/dL (ref 1.7–2.4)

## 2021-03-14 MED ORDER — LORAZEPAM 2 MG/ML IJ SOLN
1.0000 mg | INTRAMUSCULAR | Status: DC | PRN
  Administered 2021-03-14: 1 mg via INTRAVENOUS
  Administered 2021-03-15 – 2021-03-17 (×2): 2 mg via INTRAVENOUS
  Filled 2021-03-14 (×4): qty 1

## 2021-03-14 MED ORDER — PANTOPRAZOLE SODIUM 40 MG IV SOLR
40.0000 mg | INTRAVENOUS | Status: DC
  Administered 2021-03-14: 40 mg via INTRAVENOUS
  Filled 2021-03-14 (×2): qty 40

## 2021-03-14 MED ORDER — LORAZEPAM 1 MG PO TABS
1.0000 mg | ORAL_TABLET | ORAL | Status: DC | PRN
Start: 2021-03-14 — End: 2021-03-17

## 2021-03-14 MED ORDER — METOPROLOL TARTRATE 5 MG/5ML IV SOLN: 5.0000 mg | Freq: Once | INTRAVENOUS | Status: AC

## 2021-03-14 MED ORDER — THIAMINE HCL 100 MG/ML IJ SOLN
100.0000 mg | Freq: Every day | INTRAMUSCULAR | Status: DC
  Administered 2021-03-14: 100 mg via INTRAVENOUS
  Filled 2021-03-14 (×2): qty 2

## 2021-03-14 MED ORDER — FOLIC ACID 1 MG PO TABS
1.0000 mg | ORAL_TABLET | Freq: Every day | ORAL | Status: DC
  Administered 2021-03-15: 1 mg
  Filled 2021-03-14: qty 1

## 2021-03-14 MED ORDER — THIAMINE HCL 100 MG PO TABS
100.0000 mg | ORAL_TABLET | Freq: Every day | ORAL | Status: DC
  Administered 2021-03-15: 100 mg
  Filled 2021-03-14: qty 1

## 2021-03-14 MED ORDER — ADULT MULTIVITAMIN W/MINERALS CH
1.0000 | ORAL_TABLET | Freq: Every day | ORAL | Status: DC
  Administered 2021-03-15: 1
  Filled 2021-03-14: qty 1

## 2021-03-14 NOTE — Evaluation (Signed)
Occupational Therapy Evaluation Patient Details Name: Patrick Padilla MRN: [id number] DOB: 17-Feb-1956 Today's Date: 03/14/2021    History of Present Illness 65 yo patient who presented to ED with L hemiplegia and R sided jerking after attempted cardioversion by EMS for afib with RVR.  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 Few and  punctate bifrontal peripheral infarcts. Recent medial left thalamic infarct which may be subacute. 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 Per son, pt lives sedentary lifestyle. Pt appeared to be confused and hallucinating at times during evaluation. Oriented to self only and required total A to clean up from incontinent episode at bed level. Restless and pulling off O2 tubing - nsg made aware as per chart review pt heavy drinker.  VSS during session. Pt will benefit from CIR due to deficits listed below. Will follow acutely.     Follow Up Recommendations  CIR    Equipment Recommendations  3 in 1 bedside commode    Recommendations for Other Services Rehab consult     Precautions / Restrictions Precautions Precautions: Fall Precaution Comments: on continuous EEG      Mobility Bed Mobility Overal bed mobility: Needs Assistance Bed Mobility: Rolling Rolling: Max assist         General bed mobility comments: able to roll L with Mod A, to R with Max A; attempting to pull forward in bed using handrails; appears unaware that he is having a difficult time using his LUE    Transfers                 General transfer comment: unsafe at this time due to confusion and need of +2    Balance Overall balance assessment: Needs assistance (no officially test as only used egress position but suspect pt with  have both sitting and standing impaired balance)   Sitting balance-Leahy Scale: Fair Sitting balance - Comments: R bias in supported sitting                                   ADL either performed or assessed with clinical judgement   ADL Overall ADL's : Needs assistance/impaired Eating/Feeding: NPO Eating/Feeding Details (indicate cue type and reason): ice chips/water; awaiting swallow eval Grooming: Moderate assistance   Upper Body Bathing: Moderate assistance   Lower Body Bathing: Maximal assistance;Bed level   Upper Body Dressing : Moderate assistance Upper Body Dressing Details (indicate cue type and reason): pt attempting to help however missing armholes Lower Body Dressing: Total assistance       Toileting- Clothing Manipulation and Hygiene: Total assistance Toileting - Clothing Manipulation Details (indicate cue type and reason): incontinenet of BM; Condom cath fell/pulled off     Functional mobility during ADLs:  (will need +2)       Vision   Additional Comments: Will further assess; ? visual hallucinations - pt reaching for items in air; difficulty accurately reaching for target     Perception Perception Comments: impaired; will further assess? Decreased attention to L   Praxis      Pertinent Vitals/Pain Pain Assessment: No/denies pain Faces Pain Scale: No hurt     Hand Dominance Right   Extremity/Trunk Assessment Upper Extremity  Assessment Upper Extremity Assessment: LUE deficits/detail LUE Deficits / Details: LUE generalized weakness; moving out of synergy pattern; AAROM to @ 100 FF; unablet o hold @ 90; gross grasp/release; not attempting to use functionally LUE Sensation: decreased light touch LUE Coordination: decreased fine motor;decreased gross motor   Lower Extremity Assessment Lower Extremity Assessment: Defer to PT evaluation   Cervical / Trunk Assessment Cervical / Trunk Assessment: Other exceptions Cervical / Trunk  Exceptions: R bias   Communication Communication Communication: Other (comment) (inappropriate word choice at times)   Cognition Arousal/Alertness: Awake/alert Behavior During Therapy: Flat affect;Impulsive;Restless Overall Cognitive Status: Impaired/Different from baseline Area of Impairment: Orientation;Attention;Memory;Following commands;Safety/judgement;Awareness;Problem solving                 Orientation Level: Disoriented to;Place;Time;Situation Current Attention Level: Sustained Memory: Decreased short-term memory Following Commands: Follows one step commands with increased time Safety/Judgement: Decreased awareness of safety;Decreased awareness of deficits Awareness: Intellectual Problem Solving: Slow processing;Difficulty sequencing General Comments: pulling off EEG leads and O2 tubing at times   General Comments       Exercises     Shoulder Instructions      Home Living Family/patient expects to be discharged to:: Private residence Living Arrangements: Children (son, dtr-in-law and grandchild) Available Help at Discharge: Family;Available PRN/intermittently (dtr in law always home) Type of Home: House Home Access: Stairs to enter [employer] of Steps: 1 Entrance Stairs-Rails: Left Home Layout: One level     Bathroom Shower/Tub: Chief Strategy Officer: Standard     Home Equipment: None   Additional Comments: need to confirm; pt unreliable      Prior Functioning/Environment Level of Independence: Independent        Comments: per son pt was sedentary and spent most of his days in a recliner however was independent with mobility and ADL        OT Problem List: Decreased strength;Decreased range of motion;Decreased activity tolerance;Impaired balance (sitting and/or standing);Impaired vision/perception;Decreased coordination;Decreased cognition;Decreased safety awareness;Decreased knowledge of use of DME or AE;Cardiopulmonary  status limiting activity;Impaired sensation;Impaired tone;Obesity;Impaired UE functional use;Increased edema      OT Treatment/Interventions: Self-care/ADL training;Therapeutic exercise;Neuromuscular education;DME and/or AE instruction;Therapeutic activities;Cognitive remediation/compensation;Visual/perceptual remediation/compensation;Patient/family education;Balance training    OT Goals(Current goals can be found in the care plan section) Acute Rehab OT Goals Patient Stated Goal: unable to state OT Goal Formulation: Patient unable to participate in goal setting Time For Goal Achievement: 03/28/21 Potential to Achieve Goals: Good  OT Frequency: Min 2X/week   Barriers to D/C:            Co-evaluation              AM-PAC OT "6 Clicks" Daily Activity     Outcome Measure Help from another person eating meals?: Total Help from another person taking care of personal grooming?: A Lot Help from another person toileting, which includes using toliet, bedpan, or urinal?: Total Help from another person bathing (including washing, rinsing, drying)?: A Lot Help from another person to put on and taking off regular upper body clothing?: A Lot Help from another person to put on and taking off regular lower body clothing?: Total 6 Click Score: 9   End of Session Equipment Utilized During Treatment: Oxygen (2L; pt pulling off) Nurse Communication: Mobility status  Activity Tolerance: Patient tolerated treatment well Patient left: in bed;with call bell/phone within reach;with bed alarm set;with nursing/sitter in room (modified chair position)  OT Visit Diagnosis: Other abnormalities of gait and mobility (R26.89);Muscle weakness (generalized) (  M62.81);Other symptoms and signs involving cognitive function;Hemiplegia and hemiparesis Hemiplegia - Right/Left: Left Hemiplegia - dominant/non-dominant: Non-Dominant Hemiplegia - caused by: Cerebral infarction                Time: 1555-1630 OT Time  Calculation (min): 35 min Charges:  OT General Charges $OT Visit: 1 Visit OT Evaluation $OT Eval Moderate Complexity: 1 Mod OT Treatments $Self Care/Home Management : 8-22 mins  Luisa Dago, OT/L   Acute OT Clinical Specialist Acute Rehabilitation Services Pager [phone] Office [phone]   [facility] 03/14/2021, 5:32 PM

## 2021-03-14 NOTE — Progress Notes (Signed)
  Echocardiogram 2D Echocardiogram has been performed.  Patrick Padilla 03/14/2021, 8:27 AM

## 2021-03-14 NOTE — Procedures (Signed)
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 EEG to evaluate for seizure   Level of alertness: Awake, asleep   AEDs during EEG study: LEV, propofol   Technical aspects: This EEG study was done with scalp electrodes positioned according to the 10-20 International system of electrode placement. Electrical activity was acquired at a sampling rate of 500Hz  and reviewed with a high frequency filter of 70Hz  and a low frequency filter of 1Hz . EEG data were recorded continuously and digitally stored.    Description: No posterior dominant rhythm was seen. Sleep was characterized by vertex waves, sleep spindles (12 to 14 Hz), maximal frontocentral region.  EEG showed continuous generalized 3 to 6 Hz theta-delta slowing. Hyperventilation and photic stimulation were not performed.     Around 1700, patient started pulling eeg wires and was difficult to interpret.    ABNORMALITY - Continuous slow, generalized   IMPRESSION: This study is suggestive of moderate diffuse encephalopathy, nonspecific etiology but likely related to sedation. No seizures or epileptiform discharges were seen throughout the recording.   Patrick Padilla 

## 2021-03-14 NOTE — Progress Notes (Addendum)
STROKE TEAM PROGRESS NOTE    BRIEF HPI: 65 yo patient with hx EtOH abuse and a fib (unclear if on [facility]) who presented to ED with L hemiplegia and R sided jerking after attempted cardioversion by EMS for a fib with RVR. On arrival to ED per EDP patient was hemiplegic on the left with rhythmic jerking RUE. Intubated for respiratory failure with paralytics on board. Outside window for TNK. CTH possible subtle loss of gray-white differentiation in R lentiform nucleus and bilateral thalami ASPECTS 9/10. CTA showed R M1 occlusion. CTP showed R MCA infarct core 86m with penumbra of 1629m   INTERVAL HISTORY: No acute events overnight.  No visitors at bedside.  Afebrile. BP remains stable off cleviprex. HR controlled in SR.  CCM planning on vent weaning trial today.  Improved alertness today, making more sustained eye contact with examiners and following commands more reliably/. Much improved use of left hemibody.  Traci, RN reports low urine output overnight with bladder scans negative for retention.   Vitals:   03/14/21 1115 03/14/21 1137 03/14/21 1200 03/14/21 1205  BP: 123/77   122/85  Pulse: (!) 105  (!) 104 (!) 102  Resp: 17  (!) 23 17  Temp:  99.2 F (37.3 C)    TempSrc:  Axillary    SpO2: 93%  94% 93%  Weight:      Height:       CBC:  Recent Labs  Lab 03/12/21 1540 03/12/21 1600 03/13/21 0241 03/14/21 0405  WBC 20.6*  --  17.8* 16.7*  NEUTROABS 18.2*  --   --   --   HGB 18.6*   < > 16.3 13.7  HCT 53.2*   < > 47.6 40.9  MCV 100.0  --  101.9* 103.3*  PLT 217  --  205 188   < > = values in this interval not displayed.   Basic Metabolic Panel:  Recent Labs  Lab 03/13/21 1813 03/14/21 0405  NA 139 140  K 3.6 3.6  CL 106 103  CO2 23 21*  GLUCOSE 101* 88  BUN 19 22  CREATININE 1.25* 1.37*  CALCIUM 8.5* 8.8*  MG  --  2.2   Lipid Panel:  Recent Labs  Lab 03/13/21 0241  CHOL 162  TRIG 92  92  HDL 47  CHOLHDL 3.4  VLDL 18  LDLCALC 97   HgbA1c: No results for  input(s): HGBA1C in the last 168 hours. Urine Drug Screen: No results for input(s): LABOPIA, COCAINSCRNUR, LABBENZ, AMPHETMU, THCU, LABBARB in the last 168 hours.  Alcohol Level  Recent Labs  Lab 03/12/21 1540  ETH <10   IMAGING past 24 hours Overnight EEG with video  Result Date: 03/14/2021 YaLora HavensMD     03/14/2021 10:18 AM Patient Name: Patrick AcreeRN: [id number] Attending: PrLora Havenseferring Physician/Provider: Dr CoSu Monksuration: 03/13/2021 0932 to 03/14/2021 0932 Patient history: [age]o M with status epilepticus and M1 occlusion. EEG to evaluate for seizure Level of alertness: Awake, asleep AEDs during EEG study: LEV, propofol Technical aspects: This EEG study was done with scalp electrodes positioned according to the 10-20 International system of electrode placement. Electrical activity was acquired at a sampling rate of _0  and reviewed with a high frequency filter of _1  and a low frequency filter of _2 . EEG data were recorded continuously and digitally stored. Description: No posterior dominant rhythm was seen. Sleep was characterized by vertex waves, sleep spindles (12 to 14 Hz), maximal frontocentral region.  EEG showed continuous generalized 3 to 6 Hz theta-delta slowing. Hyperventilation and photic stimulation were not performed.   ABNORMALITY - Continuous slow, generalized IMPRESSION: This study is suggestive of moderate diffuse encephalopathy, nonspecific etiology but likely related to sedation. No seizures or epileptiform discharges were seen throughout the recording. Lora Havens   ECHOCARDIOGRAM COMPLETE  Result Date: 03/14/2021    ECHOCARDIOGRAM REPORT   Patient Name:   TRINTEN BOUDOIN Date of Exam: 03/14/2021 Medical Rec #:  [id number]   Height:       72.0 in Accession #:    [id number]  Weight:       198.4 lb Date of Birth:  01/16/1956   BSA:          2.123 m Patient Age:    65 years    BP:           109/69 mmHg Patient Gender: M           HR:            85 bpm. Exam Location:  Inpatient Procedure: 2D Echo, Cardiac Doppler and Color Doppler Indications:    Stroke  History:        Patient has prior history of Echocardiogram examinations, most                 recent 02/05/2019. Arrythmias:Atrial Fibrillation; Risk                 Factors:Hypertension and Dyslipidemia. ETOH abuse.  Sonographer:    Clayton Lefort RDCS (AE) Referring Phys: [id number] Patrecia Pour STACK  Sonographer Comments: Echo performed with patient supine and on artificial respirator. IMPRESSIONS  1. Left ventricular ejection fraction, by estimation, is 60 to 65%. The left ventricle has normal function. The left ventricle has no regional wall motion abnormalities. There is mild left ventricular hypertrophy. Left ventricular diastolic parameters are indeterminate.  2. Right ventricular systolic function is normal. The right ventricular size is normal.  3. Right atrial size was mildly dilated.  4. The mitral valve is abnormal. Trivial mitral valve regurgitation. No evidence of mitral stenosis.  5. The aortic valve was not well visualized. There is mild calcification of the aortic valve. Aortic valve regurgitation is not visualized. Mild aortic valve sclerosis is present, with no evidence of aortic valve stenosis.  6. The inferior vena cava is normal in size with greater than 50% respiratory variability, suggesting right atrial pressure of 3 mmHg. FINDINGS  Left Ventricle: Left ventricular ejection fraction, by estimation, is 60 to 65%. The left ventricle has normal function. The left ventricle has no regional wall motion abnormalities. The left ventricular internal cavity size was normal in size. There is  mild left ventricular hypertrophy. Left ventricular diastolic parameters are indeterminate. Right Ventricle: The right ventricular size is normal. No increase in right ventricular wall thickness. Right ventricular systolic function is normal. Left Atrium: Left atrial size was normal in size. Right Atrium: Right  atrial size was mildly dilated. Pericardium: There is no evidence of pericardial effusion. Mitral Valve: The mitral valve is abnormal. There is mild thickening of the mitral valve leaflet(s). There is mild calcification of the mitral valve leaflet(s). Trivial mitral valve regurgitation. No evidence of mitral valve stenosis. MV peak gradient, 5.6 mmHg. The mean mitral valve gradient is 2.0 mmHg. Tricuspid Valve: The tricuspid valve is normal in structure. Tricuspid valve regurgitation is trivial. No evidence of tricuspid stenosis. Aortic Valve: The aortic valve was not well visualized. There is mild calcification of the  aortic valve. Aortic valve regurgitation is not visualized. Mild aortic valve sclerosis is present, with no evidence of aortic valve stenosis. Aortic valve mean gradient measures 2.0 mmHg. Aortic valve peak gradient measures 4.8 mmHg. Aortic valve area, by VTI measures 3.65 cm. Pulmonic Valve: The pulmonic valve was normal in structure. Pulmonic valve regurgitation is not visualized. No evidence of pulmonic stenosis. Aorta: The aortic root is normal in size and structure. Venous: The inferior vena cava is normal in size with greater than 50% respiratory variability, suggesting right atrial pressure of 3 mmHg. IAS/Shunts: No atrial level shunt detected by color flow Doppler.  LEFT VENTRICLE PLAX 2D LVIDd:         4.40 cm LVIDs:         3.30 cm LV PW:         1.60 cm LV IVS:        1.30 cm LVOT diam:     2.30 cm LV SV:         71 LV SV Index:   34 LVOT Area:     4.15 cm  RIGHT VENTRICLE             IVC RV Basal diam:  3.30 cm     IVC diam: 1.80 cm RV S prime:     13.80 cm/s TAPSE (M-mode): 1.3 cm LEFT ATRIUM           Index       RIGHT ATRIUM           Index LA diam:      3.50 cm 1.65 cm/m  RA Area:     23.80 cm LA Vol (A2C): 52.0 ml 24.49 ml/m RA Volume:   68.10 ml  32.07 ml/m LA Vol (A4C): 58.8 ml 27.69 ml/m  AORTIC VALVE AV Area (Vmax):    3.35 cm AV Area (Vmean):   3.33 cm AV Area (VTI):      3.65 cm AV Vmax:           109.00 cm/s AV Vmean:          71.900 cm/s AV VTI:            0.196 m AV Peak Grad:      4.8 mmHg AV Mean Grad:      2.0 mmHg LVOT Vmax:         88.00 cm/s LVOT Vmean:        57.600 cm/s LVOT VTI:          0.172 m LVOT/AV VTI ratio: 0.88  AORTA Ao Root diam: 3.60 cm Ao Asc diam:  2.90 cm MITRAL VALVE MV Area VTI:  3.01 cm  SHUNTS MV Peak grad: 5.6 mmHg  Systemic VTI:  0.17 m MV Mean grad: 2.0 mmHg  Systemic Diam: 2.30 cm MV Vmax:      1.18 m/s MV Vmean:     69.2 cm/s Jenkins Rouge MD Electronically signed by Jenkins Rouge MD Signature Date/Time: 03/14/2021/9:49:39 AM    Final     PHYSICAL EXAM:  Temp:  [98.3 F (36.8 C)-99.7 F (37.6 C)] 99.2 F (37.3 C) (09/05 1137) Pulse Rate:  [41-141] 102 (09/05 1205) Resp:  [11-24] 17 (09/05 1205) BP: (81-173)/(49-162) 122/85 (09/05 1205) SpO2:  [91 %-98 %] 93 % (09/05 1205) FiO2 (%):  [40 %] 40 % (09/05 0759)  General - Critically ill elderly male intubated and sedated (weaning off) lying in bed in NAD Resp: Intubated, respirations even  Cardiovascular - Regular rhythm and rate.  No afib at present on tele.  Abdominal-large round soft, no tenderness on exam  Skin: warm and dry.  Ext: No edema Mental Status -  Opens eyes briskly to verbal command. Does not keep eyes open. Follows most simple commands.   Cranial Nerves II - XII -  PERRL. Right gaze preference. Corneal reflex present bilaterally,  gag and cough present. Breathing over the vent.  Facial symmetry not able to test due to ET tube.   No babinski. Sensation, coordination not cooperative and gait not tested. Right arm postural tremor.  Motor Strength -  Moving all 4 extremities purposefully against gravity R>L. Sensory -Intact x 4 extremities to light touch  Coordination - unable to assess due to asses to AMS  ASSESSMENT/PLAN: 66 yo patient with hx EtOH abuse and a fib (unclear if on [facility]) who presented to ED with L hemiplegia and R sided jerking after attempted  cardioversion by EMS for a fib with RVR.  Per report, patient last seen well when he went to bed at 10pm 9/2. Woke up 9/3 morning confused. Developed diaphoresis and chest discomfort in the afternoon. Called EMS who stated patient was in a fib with RVR. Attempted cardioversion en route. On arrival to ED per ED patient was hemiplegic on the left with rhythmic jerking RUE. He was intubated with rocuronium for acute hypoxic respiratory failure. EDP contacted neurology. Code Stroke activated while he was in the CT scan. He remained paralyzed inhibiting performance of a neurologic exam or NIHSS. Outside window for TNK. CTH showed possible subtle loss of gray-white differentiation in R lentiform nucleus and bilateral thalami ASPECTS 9/10. CTA showed  emergent R M1 occlusion. CTP showed R MCA infarct core 32m with penumbra of 1675m Family was unreachable despite multiple attempts. 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  CT Possible subtle loss of gray-white differentiation in R lentiform nucleus and bilateral thalami  CTA head & neck Emergent LVO of the right M1 segment with poor collaterals. CT perfusion: R MCA infarct core 1198mith penumbra of 165m72mR 9/3 Proximal right M1/MCA occlusion s/p mechanical thrombectomy with TICI3) MRI Confluent acute infarct at the right basal ganglia. Few and punctate bifrontal peripheral infarcts. Recent medial left thalamic infarct which may be subacute. PRES appearance in the bilateral parietooccipital cortex. Small volume blood products at the right sylvian fissure without evidence of progression from prior CT. 2D Echo EF 60-65%  EEG spot: no seizure or seizure predisposition recorded on this study.  LTM EEG This study is suggestive of moderate diffuse encephalopathy LDL 97 HgbA1c 5.2 VTE  prophylaxis - heparin subq Unclear if taking Eliquis prior to admission, currently on ASA 325mg41mly given moderate sized stroke with petechial hemorrhage. Will consider AC in a couple of days if neuro stable Therapy recommendations:  Pending  Disposition:  TBD Son updated by phone on plan of care. Questions answered. He did report patient drinks whiskey daily and smokes heavily. He does see patient taking medications but does not help with them. We discussed diagnostic findings and plan of care. Questions answered.   Atrial fibrillation S/p attempted cardioversion by EMS Rate controlled, In NSR  Unclear compliance with home Eliquis: pharmacy tech noted that the son does not know when the last eliquis was taken. There was a bottle in the bag of medications brought  in by son with only one pill left but the label was smeared so the fill date couldn't be seen. The last time the med was filled per the [facility] system was December 2021.  Now on ASA daily given moderate sized stroke with petechial hemorrhage.  Will consider AC in a couple of days if neuro stable  PRES Severe Hypertension B/p 220s/120s on presentation Home meds:  Micardis, restart if passes swallow eval or feeding tube placed 24h post IR BP goal <180/105 Stable now Long-term BP goal normotensive  Right arm shaking ? Rhythmic jerking RUE on admission More consistent with postural tremor this am EEG spot - no seizure LTM EEG- no seizure or epileptiform discharges seen On Keppra, will continue for now  Hyperlipidemia Home meds:  None LDL 97, goal < 70 Lipitor 40 started  Continue statin at discharge  Acute respiratory failure  S/p intubation Extubated mid morning 9/5 CCM managing and appreciated   Leukocytosis WBC 20.6->17.8->16, monitor  Afebrile, monitor fever curve  Blood cultures showing #1 NG at 2 days and the #2 showing staph epidermis which is felt likely to be contaminant Abd xray no acute findings CXR: Minimal  atelectasis or interstitial opacities in the right mid lung and left lung base.  Feeding/swallow status NPO, swallow eval is pending  Consider starting TF today if does not pass swallow testing MIVF NS at 75 cc/hr.   AKI Oliguria Cr 1.7->1.25->1.37 Continue IVF No apparent urinary retention on bladder scans Monitor  History of ETOH abuse Son reports he drinks whiskey nightly Alcohol level less than 10 on admission Ammonia wnl, AST wnl, Alk phos 154, Total Bilirubin 2.1. Trig <150 Check thiamine level CIWA if needed   Tobacco abuse Current heavy smoker Smoking cessation counseling will be provided  Other Stroke Risk Factors Advanced Age >/= 11  High risk for Obstructive sleep apnea  Other Active Problems  Hospital day # 2 This plan of care was directed by Dr. Stefanie Libel, NP-C  ATTENDING NOTE: I reviewed above note and agree with the assessment and plan. Pt was seen and examined.   Patient RN at bedside.  Patient still intubated, on low-dose propofol, however more awake alert than yesterday, eyes open, follow all simple commands.  Still has left sided hemiparesis but also improved from yesterday.    On exam, patient not in A. fib, on low dose propofol, eyes spontaneously open, able to follow all simple commands on the right hand and b/l toes. Eyes in right gaze preference position, barely cross midline, blinking to visual threat bilaterally, PERRL. Corneal reflex present, gag and cough present. Breathing over the vent.  Facial symmetry not able to test due to ET tube.  Tongue protrusion not cooperative. RUE at least 4/5, LUE 3/5 proximal and 3+/5 finger grip. RLE 3+/5 and LLE 3-/5 on knee flexion and toe DF/PF. DTR 1+ and no babinski. Sensation symmetrical subjectively, right FTN intact and gait not tested. Right arm postural tremor resolved.  Discussed with Dr. Lamonte Sakai CCM, will likely to be extubated today. Will need swallow evaluation after extubation. Continue  ASA for now, will consider AC in a couple of days if neuro stable. He is heavy drinker and smoker, will need CIWA protocol.   For detailed assessment and plan, please refer to above as I have made changes wherever appropriate.   This patient is critically ill due to right MCA stroke, right MCA occlusion status post thrombectomy, A. fib, hypertensive emergency, heavy smoker and heavy drinker  and at significant risk of neurological worsening, death form recurrent stroke, hemorrhagic conversion, heart failure, seizure, DT. This patient's care requires constant monitoring of vital signs, hemodynamics, respiratory and cardiac monitoring, review of multiple databases, neurological assessment, discussion with family, other specialists and medical decision making of high complexity. I spent 40 minutes of neurocritical care time in the care of this patient.    Rosalin Hawking, MD PhD Stroke Neurology 03/14/2021 4:17 PM    To contact Stroke Continuity provider, please refer to [email or web address] After hours, contact General Neurology

## 2021-03-14 NOTE — Progress Notes (Signed)
Performed EEG maintenance.  No skin breakdown observed at electrode sites F8, Fp2, Fp1.

## 2021-03-14 NOTE — Progress Notes (Signed)
Pt beginning to be disoriented to situation and place. Trying to get out of bed. Responds well to redirection and reorientation, but needs reminders soon after. Pt reports seeing the clock disappearing and reappearing as well as other visual hallucinations. NIH stable. Dr. Roda Shutters notified and CIWA ordered per request d/t pt's hx of chronic EtOH use.

## 2021-03-14 NOTE — Progress Notes (Signed)
SLP Cancellation Note  Patient Details Name: Declan Mier MRN: [id number] DOB: [birth year]   Cancelled treatment:        Reason Eval/Treat Not Completed: Medical issues which prohibited therapy (Pt is currently intubated. SLP will follow up.)  Jeannie Done, SLP-Student   Jeannie Done 03/14/2021, 8:35 AM

## 2021-03-14 NOTE — Progress Notes (Signed)
NAME:  Patrick Padilla, MRN:  [id number], DOB:  11-13-1955, LOS: 2 ADMISSION DATE:  03/12/2021, CONSULTATION DATE:  9/3 REFERRING MD:  Dr Selina Cooley, CHIEF COMPLAINT:  acute resp failure   History of Present Illness:  65 year old man history of tobacco use (?amount), hypertension, alcohol abuse, atrial fibrillation on Eliquis (unclear compliance).  Last seen normal 22:00 9/2, noted with altered mental status, confusion morning 9/3.  Then evolved chest discomfort diaphoresis.  EMS activated.  Some report of questionable seizure activity although unclear timing.  Found to be tachycardic, question atrial fibrillation plus RVR.  Cardioversion was attempted in the field.  On arrival to the ED patient had left hemiplegia with rhythmic jerking of the right upper extremity.  He required intubation and ventilation for impaired airway protection and hypoxemia.  TNK deferred, unclear timing window for symptom onset.  CT/angio showed possible infarct right lentiform nucleus and bilateral thalami, right M1 occlusion, right MCA infarct.  No family available.  He has been seen by neurology and is going urgently for neuro IR intervention, sedated and intubated.  Pertinent  Medical History   Past Medical History:  Diagnosis Date   Anxiety    Elevated LFTs    History of colon polyps    HLD (hyperlipidemia)    HTN (hypertension)    Localized superficial swelling, mass, or lump     Significant Hospital Events: Including procedures, antibiotic start and stop dates in addition to other pertinent events   CT/angio Head >> large vessel occlusion right M1 segment, evolving acute infarct right lentiform nucleus, bilateral thalami, MCA territory.  No evidence of an acute hemorrhage.  Minimal atherosclerotic changes bilateral carotids. Head CT 9/3 >> small amount of subarachnoid hypodensity right frontal, question contrast staining, no acute hemorrhage MRI brain 9/4 > confluent acute right basal ganglia infarct, few punctate  bifrontal peripheral infarcts, subacute left thalamic infarct, bilateral paraseptal cortex hyperintensity, consider PRES.  Small volume blood products right sylvian fissure, no progression  Interim History / Subjective:   0.40, PEEP 5 Propofol 20 I/O +2.7 L total Rising serum creatinine 1.25 > 1.37.  UOP 550 cc last 24 hours   Objective   Blood pressure 109/69, pulse (!) 58, temperature 98.3 F (36.8 C), temperature source Axillary, resp. rate (!) 21, height 6' (1.829 m), weight 90 kg, SpO2 97 %.    Vent Mode: PRVC FiO2 (%):  [40 %] 40 % Set Rate:  [18 bmp] 18 bmp Vt Set:  [420 mL-620 mL] 420 mL PEEP:  [5 cmH20] 5 cmH20 Pressure Support:  [12 cmH20] 12 cmH20 Plateau Pressure:  [17 cmH20-18 cmH20] 18 cmH20   Intake/Output Summary (Last 24 hours) at 03/14/2021 0730 Last data filed at 03/14/2021 0600 Gross per 24 hour  Intake 2175.89 ml  Output 75 ml  Net 2100.89 ml   Filed Weights   03/12/21 1540  Weight: 90 kg    Examination: General: Obese man, intubated, no distress.  Getting his echocardiogram now HENT: Pupils reactive, ET tube in place, oropharynx clear, large neck Lungs: Distant, clear, no crackles or wheezes Cardiovascular: Regular, distant, no murmur Abdomen: Obese, nondistended with positive bowel sounds Extremities: No significant edema Neuro: Eyes open and response to voice, tracks, nods to questions.  Moves his left toes slightly, is not moving his left upper extremity.  Does follow commands on the right  Resolved Hospital Problem list     Assessment & Plan:  Acute right lentiform nucleus, MCA territory CVA Question PRES based on MRI 9/4 -Blood pressure  goal 120-140, off Cleviprex -Anticoagulation, aspirin as per neurology plans -On Keppra, seizure precautions -PT, OT, SLP  Acute respiratory failure, principally due to encephalopathy.  Consider contribution of hemodynamic instability in the setting of tachycardia -Currently tolerating PSV.  More awake,  even on residual propofol currently.  We will push for possible extubation today 9/5 -Pulmonary hygiene -VAP prevention orders -Will need SLP evaluation  Encephalopathy, need for sedation -Plan to hold propofol 9/5, assess for extubation.  -consider transition to precedex if unable to extubate  Acute renal insufficiency w oliguria. S Cr 1.17 > 1.37 -follow UOP, S Cr -IVF support, 75cch  Atrial fibrillation with RVR -Unclear whether he was compliant with his Eliquis as an outpatient.   -defer anticoag timing to neurology, currently on heparin sq prophylaxis -metoprolol available for rate control  Hypertension -will likely be able to restart home meds as he comes off sedation.   Hx EtOH use -No current evidence of withdrawal, watch for any evolving symptoms.    Best Practice (right click and "Reselect all SmartList Selections" daily)   Diet/type: NPO DVT prophylaxis: SCD GI prophylaxis: PPI Lines: N/A Foley:  Yes, and it is still needed Code Status:  full code Last date of multidisciplinary goals of care discussion [pending]  Labs   CBC: Recent Labs  Lab 03/12/21 1540 03/12/21 1600 03/12/21 1645 03/12/21 2035 03/13/21 0241 03/14/21 0405  WBC 20.6*  --   --   --  17.8* 16.7*  NEUTROABS 18.2*  --   --   --   --   --   HGB 18.6* 18.4* 16.7 17.3* 16.3 13.7  HCT 53.2* 54.0* 49.0 51.0 47.6 40.9  MCV 100.0  --   --   --  101.9* 103.3*  PLT 217  --   --   --  205 188    Basic Metabolic Panel: Recent Labs  Lab 03/12/21 1540 03/12/21 1600 03/12/21 1645 03/12/21 2035 03/13/21 0241 03/13/21 1813 03/14/21 0405  NA 140   < > 139 140 137 139 140  K 3.7   < > 3.4* 3.8 3.8 3.6 3.6  CL 106  --   --   --  105 106 103  CO2 20*  --   --   --  20* 23 21*  GLUCOSE 165*  --   --   --  133* 101* 88  BUN 8  --   --   --  12 19 22   CREATININE 1.17  --   --   --  1.23 1.25* 1.37*  CALCIUM 9.5  --   --   --  8.8* 8.5* 8.8*  MG  --   --   --   --   --   --  2.2   < > = values  in this interval not displayed.   GFR: Estimated Creatinine Clearance: 59 mL/min (A) (by C-G formula based on SCr of 1.37 mg/dL (H)). Recent Labs  Lab 03/12/21 1540 03/13/21 0241 03/14/21 0405  WBC 20.6* 17.8* 16.7*  LATICACIDVEN 3.7*  --   --     Liver Function Tests: Recent Labs  Lab 03/12/21 1540  AST 24  ALT 16  ALKPHOS 154*  BILITOT 2.1*  PROT 6.9  ALBUMIN 3.7   No results for input(s): LIPASE, AMYLASE in the last 168 hours. Recent Labs  Lab 03/12/21 1540  AMMONIA 31    ABG    Component Value Date/Time   PHART 7.336 (L) 03/12/2021 2035   PCO2ART 40.1  03/12/2021 2035   PO2ART 78 (L) 03/12/2021 2035   HCO3 21.5 03/12/2021 2035   TCO2 23 03/12/2021 2035   ACIDBASEDEF 4.0 (H) 03/12/2021 2035   O2SAT 95.0 03/12/2021 2035     Coagulation Profile: No results for input(s): INR, PROTIME in the last 168 hours.  Cardiac Enzymes: Recent Labs  Lab 03/12/21 1546  CKTOTAL 130    HbA1C: Hgb A1c MFr Bld  Date/Time Value Ref Range Status  02/04/2019 03:16 AM 5.2 4.8 - 5.6 % Final    Comment:    (NOTE) Pre diabetes:          5.7%-6.4% Diabetes:              >6.4% Glycemic control for   <7.0% adults with diabetes     CBG: No results for input(s): GLUCAP in the last 168 hours.    Critical care time:  33 minutes     Levy Pupa, MD, PhD 03/14/2021, 7:30 AM [facility] [phone] or if no answer before 7:00PM call [phone] For any issues after 7:00PM please call eLink [phone]

## 2021-03-14 NOTE — Progress Notes (Signed)
LTM EEG discontinued - no skin breakdown at unhook.   

## 2021-03-14 NOTE — Procedures (Signed)
Patient Name: Patrick Padilla  MRN: [id number]  Epilepsy Attending: Charlsie Quest  Referring Physician/Provider: Dr Bing Neighbors Duration: 03/13/2021 0932 to 03/14/2021 0932  Patient history: 65 yo M with status epilepticus and M1 occlusion. EEG to evaluate for seizure  Level of alertness: Awake, asleep  AEDs during EEG study: LEV, propofol  Technical aspects: This EEG study was done with scalp electrodes positioned according to the 10-20 International system of electrode placement. Electrical activity was acquired at a sampling rate of 500Hz  and reviewed with a high frequency filter of 70Hz  and a low frequency filter of 1Hz . EEG data were recorded continuously and digitally stored.   Description: No posterior dominant rhythm was seen. Sleep was characterized by vertex waves, sleep spindles (12 to 14 Hz), maximal frontocentral region.  EEG showed continuous generalized 3 to 6 Hz theta-delta slowing. Hyperventilation and photic stimulation were not performed.     ABNORMALITY - Continuous slow, generalized  IMPRESSION: This study is suggestive of moderate diffuse encephalopathy, nonspecific etiology but likely related to sedation. No seizures or epileptiform discharges were seen throughout the recording.  Kenderick Kobler 

## 2021-03-14 NOTE — Evaluation (Signed)
Speech Language Pathology Evaluation Patient Details Name: Patrick Padilla MRN: [id number] DOB: [birth year] Today's Date: 03/14/2021 Time: 7829-5621 SLP Time Calculation (min) (ACUTE ONLY): 25 min  Problem List:  Patient Active Problem List   Diagnosis Date Noted   Acute ischemic right MCA stroke (HCC) 03/12/2021   Alcohol abuse    Seizure (HCC)    Essential hypertension 12/26/2019   Longstanding persistent atrial fibrillation (HCC)    Atrial fibrillation with RVR (HCC) 02/03/2019   Hyperlipidemia 02/03/2019   Anxiety 02/03/2019   Past Medical History:  Past Medical History:  Diagnosis Date   Anxiety    Elevated LFTs    History of colon polyps    HLD (hyperlipidemia)    HTN (hypertension)    Localized superficial swelling, mass, or lump    Past Surgical History:  Past Surgical History:  Procedure Laterality Date   CARDIOVERSION N/A 02/05/2019   Procedure: CARDIOVERSION;  Surgeon: Wendall Stade, MD;  Location: [facility] ENDOSCOPY;  Service: Cardiovascular;  Laterality: N/A;   CERVICAL DISC SURGERY     LEG SURGERY     right leg and ankle   NECK SURGERY     cyst removed   TEE WITHOUT CARDIOVERSION N/A 02/05/2019   Procedure: TRANSESOPHAGEAL ECHOCARDIOGRAM (TEE);  Surgeon: Wendall Stade, MD;  Location: [facility] ENDOSCOPY;  Service: Cardiovascular;  Laterality: N/A;   TOTAL HIP ARTHROPLASTY     left   HPI:  65 yo patient who presented to ED with L hemiplegia and R sided jerking after attempted cardioversion by EMS for afib with RVR. MRI acute infarct at the right basal ganglia. Few and  punctate bifrontal peripheral infarcts., recent medial left thalamic infarct which may be subacute, Posterior reversible encephalopathy in the bilateral parietooccipital cortex. Intubated 9/3-9/5. PMH:  hx EtOH abuse and  afib (unclear if on [facility]), anxiety HTN PSH: cardioversion, L THA.   Assessment / Plan / Recommendation Clinical Impression  Pt was seen for a bedside swallow evaluation. Pt awake and alert  throughout but still a bit lethargic from extubation procedure. Oral mechanism exam revealed missing teeth (two remaining) but was otherwise unremarkable. Pt completed oral care, and SLP trialed ice chips, thin liquids, puree, and regular solids. Pt presented with intermittent cough at baseline, reports smoking for many years. Pt showed no signs of oral phase impairment and no overt s/s of aspiration. Pt with intermittent weak throat clear but reported it was d/t mucous, likely s/p recent intubation. Recommend water and ice chips as desired until SLP f/u in the AM d/t lethargy and recent extubation.    SLP Assessment  SLP Visit Diagnosis: Dysphagia, unspecified (R13.10)    Follow Up Recommendations  24 hour supervision/assistance    Frequency and Duration min 2x/week         SLP Evaluation Cognition  Orientation Level: Oriented X4       Comprehension       Expression     Oral / Motor  Oral Motor/Sensory Function Overall Oral Motor/Sensory Function: Within functional limits   GO           Jeannie Done, SLP-Student          Jeannie Done 03/14/2021, 3:19 PM

## 2021-03-14 NOTE — Procedures (Signed)
Extubation Procedure Note  Patient Details:   Name: Patrick Padilla DOB: 1956/01/01 MRN: [id number]   Airway Documentation:    Vent end date: 03/14/21 Vent end time: 1025   Evaluation  O2 sats: stable throughout Complications: No apparent complications Patient did tolerate procedure well. Bilateral Breath Sounds: Clear, Diminished   Yes No stridor noted.  Seva Chancy 03/14/2021, 10:29 AM

## 2021-03-14 NOTE — Anesthesia Postprocedure Evaluation (Signed)
Anesthesia Post Note  Patient: Patrick Padilla  Procedure(s) Performed: RADIOLOGY WITH ANESTHESIA     Patient location during evaluation: SICU Anesthesia Type: General Level of consciousness: sedated Pain management: pain level controlled Vital Signs Assessment: post-procedure vital signs reviewed and stable Respiratory status: patient remains intubated per anesthesia plan Cardiovascular status: stable Postop Assessment: no apparent nausea or vomiting Anesthetic complications: no   No notable events documented.  Last Vitals:  Vitals:   03/14/21 1400 03/14/21 1500  BP: 121/76 130/77  Pulse: 97 99  Resp: 19 18  Temp:    SpO2: 94% 93%    Last Pain:  Vitals:   03/14/21 1456  TempSrc:   PainSc: 3                  Timathy Newberry

## 2021-03-14 NOTE — Progress Notes (Signed)
Pt extubated at 1025. Tolerated well. Pt's rhythm converted to Afib shortly after. HR sustained 120s-160s. BP stable. Dr. Delton Coombes notified and given 5mg  Lopressor IV per order. Pt tolerated well. HR decreased to 100s-110s. BP stable.

## 2021-03-15 DIAGNOSIS — M6281 Muscle weakness (generalized): Secondary | ICD-10-CM | POA: Diagnosis not present

## 2021-03-15 DIAGNOSIS — F1010 Alcohol abuse, uncomplicated: Secondary | ICD-10-CM | POA: Diagnosis not present

## 2021-03-15 DIAGNOSIS — I4891 Unspecified atrial fibrillation: Secondary | ICD-10-CM | POA: Diagnosis not present

## 2021-03-15 DIAGNOSIS — I63512 Cerebral infarction due to unspecified occlusion or stenosis of left middle cerebral artery: Secondary | ICD-10-CM

## 2021-03-15 HISTORY — PX: IR US GUIDE VASC ACCESS RIGHT: IMG2390

## 2021-03-15 LAB — HEMOGLOBIN A1C
Hgb A1c MFr Bld: 5.4 % (ref 4.8–5.6)
Mean Plasma Glucose: 108 mg/dL

## 2021-03-15 LAB — CBC
HCT: 43.8 % (ref 39.0–52.0)
Hemoglobin: 14.8 g/dL (ref 13.0–17.0)
MCH: 34.6 pg — ABNORMAL HIGH (ref 26.0–34.0)
MCHC: 33.8 g/dL (ref 30.0–36.0)
MCV: 102.3 fL — ABNORMAL HIGH (ref 80.0–100.0)
Platelets: 192 10*3/uL (ref 150–400)
RBC: 4.28 MIL/uL (ref 4.22–5.81)
RDW: 13.4 % (ref 11.5–15.5)
WBC: 10.9 10*3/uL — ABNORMAL HIGH (ref 4.0–10.5)
nRBC: 0 % (ref 0.0–0.2)

## 2021-03-15 LAB — BASIC METABOLIC PANEL
Anion gap: 10 (ref 5–15)
BUN: 15 mg/dL (ref 8–23)
CO2: 22 mmol/L (ref 22–32)
Calcium: 8.3 mg/dL — ABNORMAL LOW (ref 8.9–10.3)
Chloride: 108 mmol/L (ref 98–111)
Creatinine, Ser: 0.98 mg/dL (ref 0.61–1.24)
GFR, Estimated: >60 mL/min (ref >60)
Glucose, Bld: 80 mg/dL (ref 70–99)
Potassium: 3.2 mmol/L — ABNORMAL LOW (ref 3.5–5.1)
Sodium: 140 mmol/L (ref 135–145)

## 2021-03-15 LAB — GLUCOSE, CAPILLARY: Glucose-Capillary: 116 mg/dL — ABNORMAL HIGH (ref 70–99)

## 2021-03-15 LAB — APTT: aPTT: 37 seconds — ABNORMAL HIGH (ref 24–36)

## 2021-03-15 LAB — MAGNESIUM: Magnesium: 1.9 mg/dL (ref 1.7–2.4)

## 2021-03-15 MED ORDER — ATORVASTATIN CALCIUM 40 MG PO TABS
40.0000 mg | ORAL_TABLET | Freq: Every day | ORAL | Status: DC
  Administered 2021-03-16 – 2021-03-17 (×2): 40 mg via ORAL
  Filled 2021-03-15 (×2): qty 1

## 2021-03-15 MED ORDER — METOPROLOL TARTRATE 50 MG PO TABS
100.0000 mg | ORAL_TABLET | Freq: Two times a day (BID) | ORAL | Status: DC
  Administered 2021-03-15 – 2021-03-17 (×5): 100 mg via ORAL
  Filled 2021-03-15 (×6): qty 2

## 2021-03-15 MED ORDER — POTASSIUM CHLORIDE 10 MEQ/100ML IV SOLN
10.0000 meq | INTRAVENOUS | Status: AC
  Administered 2021-03-15 (×4): 10 meq via INTRAVENOUS
  Filled 2021-03-15 (×4): qty 100

## 2021-03-15 MED ORDER — FOLIC ACID 1 MG PO TABS
1.0000 mg | ORAL_TABLET | Freq: Every day | ORAL | Status: DC
  Administered 2021-03-16 – 2021-03-17 (×2): 1 mg via ORAL
  Filled 2021-03-15 (×2): qty 1

## 2021-03-15 MED ORDER — PANTOPRAZOLE SODIUM 40 MG PO TBEC: 40.0000 mg | DELAYED_RELEASE_TABLET | Freq: Every day | ORAL | Status: DC

## 2021-03-15 MED ORDER — METOPROLOL TARTRATE 50 MG PO TABS: 50.0000 mg | ORAL_TABLET | Freq: Two times a day (BID) | ORAL | Status: DC

## 2021-03-15 MED ORDER — ADULT MULTIVITAMIN W/MINERALS CH
1.0000 | ORAL_TABLET | Freq: Every day | ORAL | Status: DC
  Administered 2021-03-16 – 2021-03-17 (×2): 1 via ORAL
  Filled 2021-03-15 (×2): qty 1

## 2021-03-15 MED ORDER — HEPARIN (PORCINE) 25000 UT/250ML-% IV SOLN
2000.0000 [IU]/h | INTRAVENOUS | Status: DC
  Administered 2021-03-15: 1100 [IU]/h via INTRAVENOUS
  Administered 2021-03-16: 1500 [IU]/h via INTRAVENOUS
  Filled 2021-03-15 (×3): qty 250

## 2021-03-15 MED ORDER — ASPIRIN 325 MG PO TABS: 325.0000 mg | ORAL_TABLET | Freq: Every day | ORAL | Status: DC

## 2021-03-15 MED ORDER — THIAMINE HCL 100 MG PO TABS
100.0000 mg | ORAL_TABLET | Freq: Every day | ORAL | Status: DC
  Administered 2021-03-16 – 2021-03-17 (×2): 100 mg via ORAL
  Filled 2021-03-15 (×2): qty 1

## 2021-03-15 MED ORDER — IRBESARTAN 150 MG PO TABS
75.0000 mg | ORAL_TABLET | Freq: Every day | ORAL | Status: DC
  Administered 2021-03-15 – 2021-03-17 (×3): 75 mg via ORAL
  Filled 2021-03-15 (×3): qty 1

## 2021-03-15 NOTE — TOC CAGE-AID Note (Signed)
Transition of Care [facility]) - CAGE-AID Screening   Patient Details  Name: Patrick Padilla MRN: [id number] Date of Birth: 1955/07/31  Transition of Care [facility]) CM/SW Contact:    Lossie Faes Tarpley-Carter, LCSWA Phone Number: 03/15/2021, 2:36 PM   Clinical Narrative: Pt participated in Cage-Aid.  Pt stated he does not use substance or ETOH.  Pt was not offered resources, due to no usage of substance or ETOH.    Taran Haynesworth Tarpley-Carter, MSW, LCSW-A Pronouns:  She/Her/Hers [facility] Clinical Social Worker Direct Number:  [phone] [email or web address]    CAGE-AID Screening:    Have You Ever Felt You Ought to Cut Down on Your Drinking or Drug Use?: No Have People Annoyed You By [employer] Your Drinking Or Drug Use?: No Have You Felt Bad Or Guilty About Your Drinking Or Drug Use?: No Have You Ever Had a Drink or Used Drugs First Thing In The Morning to Steady Your Nerves or to Get Rid of a Hangover?: No CAGE-AID Score: 0  Substance Abuse Education Offered: No

## 2021-03-15 NOTE — Evaluation (Signed)
Speech Language Pathology Evaluation Patient Details Name: Patrick Padilla MRN: [id number] DOB: 20-Aug-1955 Today's Date: 03/15/2021 Time: 1027-2536 SLP Time Calculation (min) (ACUTE ONLY): 10 min  Problem List:  Patient Active Problem List   Diagnosis Date Noted   Acute ischemic right MCA stroke (HCC) 03/12/2021   Alcohol abuse    Seizure (HCC)    Essential hypertension 12/26/2019   Longstanding persistent atrial fibrillation (HCC)    Atrial fibrillation with RVR (HCC) 02/03/2019   Hyperlipidemia 02/03/2019   Anxiety 02/03/2019   Past Medical History:  Past Medical History:  Diagnosis Date   Anxiety    Elevated LFTs    History of colon polyps    HLD (hyperlipidemia)    HTN (hypertension)    Localized superficial swelling, mass, or lump    Past Surgical History:  Past Surgical History:  Procedure Laterality Date   CARDIOVERSION N/A 02/05/2019   Procedure: CARDIOVERSION;  Surgeon: Wendall Stade, MD;  Location: [facility] ENDOSCOPY;  Service: Cardiovascular;  Laterality: N/A;   CERVICAL DISC SURGERY     LEG SURGERY     right leg and ankle   NECK SURGERY     cyst removed   RADIOLOGY WITH ANESTHESIA N/A 03/12/2021   Procedure: RADIOLOGY WITH ANESTHESIA;  Surgeon: Radiologist, Medication, MD;  Location: [facility] OR;  Service: Radiology;  Laterality: N/A;   TEE WITHOUT CARDIOVERSION N/A 02/05/2019   Procedure: TRANSESOPHAGEAL ECHOCARDIOGRAM (TEE);  Surgeon: Wendall Stade, MD;  Location: [facility] ENDOSCOPY;  Service: Cardiovascular;  Laterality: N/A;   TOTAL HIP ARTHROPLASTY     left   HPI:  65 yo patient who presented to ED with L hemiplegia and R sided jerking after attempted cardioversion by EMS for afib with RVR. MRI acute infarct at the right basal ganglia. Few and  punctate bifrontal peripheral infarcts., recent medial left thalamic infarct which may be subacute, Posterior reversible encephalopathy in the bilateral parietooccipital cortex. Intubated 9/3-9/5. PMH:  hx EtOH abuse and  afib (unclear  if on [facility]), anxiety HTN PSH: cardioversion, L THA.   Assessment / Plan / Recommendation Clinical Impression  Pt was seen for a speech language evaluation. Language appeared to be [facility]. Oral mechanism exam revealed missing dentition (two teeth remaining) but was otherwise unremarkable. Pt presents with moderate hoarseness but reports smoking for many years. No other signs of motor speech impairment apparent. SLP administered SLUMS evaluation to screen for cognitive deficits. Pt showed overall impairments across all parameters of the screening test: orientation, immediate recall, divergent naming, short term and working memory, and visuospatial awareness. SLUMS score: 5/30. SLP to follow for cognitive therapy targeting orientation, awareness, and memory. Recommend inpatient rehab.    SLP Assessment  SLP Recommendation/Assessment: Patient needs continued Speech Lanaguage Pathology Services SLP Visit Diagnosis: Cognitive communication deficit (R41.841)    Follow Up Recommendations       Frequency and Duration min 2x/week  2 weeks      SLP Evaluation Cognition  Overall Cognitive Status: Impaired/Different from baseline Arousal/Alertness: Awake/alert Orientation Level: Oriented to person;Disoriented to place;Disoriented to time;Disoriented to situation Year: Other (Comment) Month:  (2003) Day of Week: Incorrect Attention: Focused;Sustained Focused Attention: Impaired Focused Attention Impairment: Functional basic Sustained Attention: Impaired Sustained Attention Impairment: Functional basic Memory: Impaired Memory Impairment: Storage deficit Awareness: Impaired Awareness Impairment: Intellectual impairment;Emergent impairment;Anticipatory impairment Problem Solving: Impaired Problem Solving Impairment: Functional basic Executive Function: Self Monitoring;Self Correcting Self Monitoring: Impaired Self Monitoring Impairment: Functional basic Behaviors: Restless Safety/Judgment: Impaired        Comprehension  Auditory  Comprehension Overall Auditory Comprehension: Appears within functional limits for tasks assessed Conversation: Simple Visual Recognition/Discrimination Discrimination: Not tested Reading Comprehension Reading Status: Within funtional limits    Expression Expression Primary Mode of Expression: Verbal Verbal Expression Overall Verbal Expression: Appears within functional limits for tasks assessed Initiation: No impairment Level of Generative/Spontaneous Verbalization: Sentence Repetition: No impairment Pragmatics: Impairment Impairments: Eye contact Interfering Components: Attention Written Expression Dominant Hand: Right Written Expression: Exceptions to [facility]   Oral / Motor  Oral Motor/Sensory Function Overall Oral Motor/Sensory Function: Within functional limits Motor Speech Overall Motor Speech: Appears within functional limits for tasks assessed Respiration: Within functional limits Phonation: Hoarse Resonance: Within functional limits Articulation: Within functional limitis Intelligibility: Intelligible Motor Planning: Witnin functional limits Motor Speech Errors: Not applicable   GO           Jeannie Done, SLP-Student          Jeannie Done 03/15/2021, 11:12 AM

## 2021-03-15 NOTE — Progress Notes (Signed)
Physical Therapy Treatment Patient Details Name: Patrick Padilla MRN: [id number] DOB: [birth year] Today's Date: 03/15/2021    History of Present Illness 65 yo patient who presented to ED with L hemiplegia and R sided jerking after attempted cardioversion by EMS for afib with RVR.  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 Few and  punctate bifrontal peripheral infarcts. Recent medial left thalamic infarct which may be subacute. 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 Extubated 9/5. PMH:  hx EtOH abuse and  afib (unclear if on AC), anxiety HTN PSH: cardioversion, L THA    PT Comments    Pt now extubated. Pt actively moving but impulsively with noted decreased safety awareness and insight to deficits. Pt with noted improved L UE and LE strength but also with inattention. Pt able to complete std pvt transfer to recliner today.  Pt continues to have impaired processing, sequencing, and decreased attn span limiting ability to stay on task for ther ex.  Acute PT to cont to follow.   Follow Up Recommendations  CIR     Equipment Recommendations   (TBD at next venue)    Recommendations for Other Services Rehab consult     Precautions / Restrictions Precautions Precautions: Fall Restrictions Weight Bearing Restrictions: No    Mobility  Bed Mobility Overal bed mobility: Needs Assistance Bed Mobility: Supine to Sit     Supine to sit: Mod assist;+2 for physical assistance     General bed mobility comments: max directional verbal cues, tactile cues to stay on task, modA for LE mangement and trunk elevation as pt with difficulty processing directions/follow commands    Transfers Overall transfer level: Needs assistance Equipment used: 2 person hand held assist Transfers: Sit to/from Stand;Stand Pivot Transfers Sit to Stand: Mod assist;+2 physical assistance Stand pivot transfers: Mod assist;+2 physical assistance        General transfer comment: tactile cues to power up, modA x2 to steady, pt with posterior lean, bracing on the bed via back of legs, pt with wide base of support and short shuffled steps to the chair with significant instability however kept trying to walk to the bathroom, used urinal in standing  Ambulation/Gait             General Gait Details: unsafe this date   Stairs             Wheelchair Mobility    Modified Rankin (Stroke Patients Only) Modified Rankin (Stroke Patients Only) Pre-Morbid Rankin Score: No significant disability Modified Rankin: Moderately severe disability     Balance Overall balance assessment: Needs assistance Sitting-balance support: Feet supported;Bilateral upper extremity supported Sitting balance-Leahy Scale: Poor     Standing balance support: Bilateral upper extremity supported;During functional activity Standing balance-Leahy Scale: Zero Standing balance comment: dependent on physical assist                            Cognition Arousal/Alertness: Awake/alert Behavior During Therapy: Impulsive Overall Cognitive Status: Impaired/Different from baseline Area of Impairment: Orientation;Attention;Memory;Following commands;Safety/judgement;Awareness;Problem solving                 Orientation Level: Disoriented to;Time (stated hospital and stroke) Current Attention Level: Focused Memory: Decreased short-term memory;Decreased recall of precautions Following Commands: Follows one step commands with increased time;Follows one step commands inconsistently Safety/Judgement: Decreased awareness of safety;Decreased awareness of deficits (impulsive) Awareness: Emergent (states "i have to go to the bathroom" but couldn't control it) Problem Solving:  Slow processing;Decreased initiation;Difficulty sequencing;Requires verbal cues;Requires tactile cues        Exercises Other Exercises Other Exercises: attempted LE exercises  however pt couldn't stay on task    General Comments General comments (skin integrity, edema, etc.): watch BP and HR, HR increased to 150s with mobiltiy and transfer to chair      Pertinent Vitals/Pain Pain Assessment: No/denies pain    Home Living                      Prior Function            PT Goals (current goals can now be found in the care plan section) Progress towards PT goals: Progressing toward goals    Frequency    Min 4X/week      PT Plan Frequency needs to be updated    Co-evaluation              AM-PAC PT "6 Clicks" Mobility   Outcome Measure  Help needed turning from your back to your side while in a flat bed without using bedrails?: A Lot Help needed moving from lying on your back to sitting on the side of a flat bed without using bedrails?: A Lot Help needed moving to and from a bed to a chair (including a wheelchair)?: A Lot Help needed standing up from a chair using your arms (e.g., wheelchair or bedside chair)?: A Lot Help needed to walk in hospital room?: A Lot Help needed climbing 3-5 steps with a railing? : Total 6 Click Score: 11    End of Session Equipment Utilized During Treatment: Gait belt Activity Tolerance: Treatment limited secondary to medical complications (Comment) Patient left: in chair;with call bell/phone within reach;with chair alarm set Nurse Communication: Mobility status PT Visit Diagnosis: Unsteadiness on feet (R26.81);Difficulty in walking, not elsewhere classified (R26.2)     Time: 5809-9833 PT Time Calculation (min) (ACUTE ONLY): 31 min  Charges:  $Therapeutic Activity: 8-22 mins $Neuromuscular Re-education: 8-22 mins                     Patrick Padilla, PT, DPT Acute Rehabilitation Services Pager #: [phone] Office #: [phone]    Patrick Padilla 03/15/2021, 2:20 PM

## 2021-03-15 NOTE — Progress Notes (Addendum)
STROKE TEAM PROGRESS NOTE    BRIEF HPI: 65 yo patient with hx EtOH abuse and a fib (unclear if on [facility]) who presented to ED with L hemiplegia and R sided jerking after attempted cardioversion by EMS for a fib with RVR. On arrival to ED per EDP patient was hemiplegic on the left with rhythmic jerking RUE. Intubated for respiratory failure with paralytics on board. Outside window for TNK. CTH possible subtle loss of gray-white differentiation in R lentiform nucleus and bilateral thalami ASPECTS 9/10. CTA showed R M1 occlusion. CTP showed R MCA infarct core 56ml with penumbra of .   INTERVAL HISTORY: No acute events overnight. Nursing reports he has been fidgeting and pulling condom cath off at intervals so urine output is not entirely accounted for.  No visitors at bedside.  Remains extubated. Afebrile. ST cleared for dysphagia diet. A fib noted yesterday afternoon. Mildly tachycardic overnight to 100s.  He reports his right IV is painful but otherwise without complaints.  We discussed his stroke diagnosis, work up and plan of care including possible transfer to floor today. Dr. Roda Shutters advised him to quit ETOH and smoking.  Bedside RN present for our visit with patient.   Vitals:   03/15/21 0500 03/15/21 0600 03/15/21 0700 03/15/21 0725  BP: (!) 156/97 (!) 145/97 (!) 159/100   Pulse: 98 97 (!) 102   Resp: (!) 21 (!) 21 20   Temp:    98.5 F (36.9 C)  TempSrc:    Oral  SpO2: 91% 91% 92%   Weight:      Height:       CBC:  Recent Labs  Lab 03/12/21 1540 03/12/21 1600 03/14/21 0405 03/15/21 0206  WBC 20.6*   < > 16.7* 10.9*  NEUTROABS 18.2*  --   --   --   HGB 18.6*   < > 13.7 14.8  HCT 53.2*   < > 40.9 43.8  MCV 100.0   < > 103.3* 102.3*  PLT 217   < > 188 192   < > = values in this interval not displayed.   Basic Metabolic Panel:  Recent Labs  Lab 03/14/21 0405 03/15/21 0206  NA 140 140  K 3.6 3.2*  CL 103 108  CO2 21* 22  GLUCOSE 88 80  BUN 22 15  CREATININE 1.37* 0.98   CALCIUM 8.8* 8.3*  MG 2.2  --    Lipid Panel:  Recent Labs  Lab 03/13/21 0241  CHOL 162  TRIG 92  92  HDL 47  CHOLHDL 3.4  VLDL 18  LDLCALC 97   HgbA1c: No results for input(s): HGBA1C in the last 168 hours. Urine Drug Screen: No results for input(s): LABOPIA, COCAINSCRNUR, LABBENZ, AMPHETMU, THCU, LABBARB in the last 168 hours.  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 EEG to evaluate for seizure Level of alertness: Awake, asleep AEDs during EEG study: LEV, propofol Technical aspects: This EEG study was done with scalp electrodes positioned according to the 10-20 International system of electrode placement. Electrical activity was acquired at a sampling rate of 500Hz  and reviewed with a high frequency filter of 70Hz   and a low frequency filter of 1Hz . EEG data were recorded continuously and digitally stored. Description: No posterior dominant rhythm was seen. Sleep was characterized by vertex waves, sleep spindles (12 to 14 Hz), maximal frontocentral region.  EEG showed continuous generalized 3 to 6 Hz theta-delta slowing. Hyperventilation and photic stimulation were not performed.   ABNORMALITY - Continuous slow, generalized IMPRESSION: This study is suggestive of moderate diffuse encephalopathy, nonspecific etiology but likely related to sedation. No seizures or epileptiform discharges were seen throughout the recording. [name]    PHYSICAL EXAM:  Temp:  [98.3 F (36.8 C)-99.5 F (37.5 C)] 98.5 F (36.9 C) (09/06 0725) Pulse Rate:  [82-141] 102 (09/06 0700) Resp:  [15-24] 20 (09/06 0700) BP: (118-173)/(71-162)  159/100 (09/06 0700) SpO2:  [88 %-98 %] 92 % (09/06 0700)  General - Critically ill elderly male intubated and sedated (weaning off) lying in bed in NAD Resp: Intubated, respirations even  Cardiovascular - Regular rhythm and rate. No afib at present on tele.  Abdominal-large round soft, no tenderness on exam  Skin: warm and dry.  Ext: No edema Mental Status -  Opens eyes briskly to verbal command. Does not keep eyes open. Follows most simple commands.   Cranial Nerves II - XII -  PERRL. Right gaze preference. Corneal reflex present bilaterally,  gag and cough present. Breathing over the vent.  Facial symmetry not able to test due to ET tube.   No babinski. Sensation, coordination not cooperative and gait not tested. Right arm postural tremor.  VFF Left facial droop  Left Arm numb weak  Lower ext prox weakness  Motor Strength -  Moving all 4 extremities purposefully against gravity R>L. Sensory -Intact x 4 extremities to light touch  Coordination - unable to assess due to asses to AMS  ASSESSMENT/PLAN: 65 yo patient with hx EtOH abuse and [facility] who presented to ED with L hemiplegia and R sided jerking after attempted cardioversion by EMS for a fib with RVR.  Per report, patient last seen well when he went to bed at 10pm 9/2. Woke up 9/3 morning confused. Developed diaphoresis and chest discomfort in the afternoon. Called EMS who stated patient was in a fib with RVR. Attempted cardioversion en route. On arrival to ED per ED patient was hemiplegic on the left with rhythmic jerking RUE. He was intubated with rocuronium for acute hypoxic respiratory failure. EDP contacted neurology. Code Stroke activated while he was in the CT scan. He remained paralyzed inhibiting performance of a neurologic exam or NIHSS. Outside window for TNK. CTH showed possible subtle loss of gray-white differentiation in R lentiform nucleus and bilateral thalami ASPECTS 9/10. CTA showed  emergent R M1  occlusion. CTP showed R MCA infarct core 11ml with penumbra of 165ml. Family was unreachable despite multiple attempts. 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  CT Possible subtle loss of gray-white differentiation in R lentiform nucleus and bilateral thalami  CTA head & neck Emergent LVO of the right M1 segment with poor collaterals. CT perfusion: R MCA infarct core 11ml with penumbra of 165ml. IR 9/3 Proximal right M1/MCA occlusion s/p mechanical thrombectomy with TICI3) MRI Confluent acute infarct at the right basal ganglia. Few and punctate bifrontal peripheral infarcts. Recent medial left thalamic infarct which may be subacute. PRES  appearance in the bilateral parietooccipital cortex. Small volume blood products at the right sylvian fissure without evidence of progression from prior CT. 2D Echo EF 60-65%  EEG spot: no seizure or seizure predisposition recorded on this study.  LTM EEG This study is suggestive of moderate diffuse encephalopathy LDL 97 HgbA1c 5.2 VTE prophylaxis - heparin subq Unclear if taking Eliquis prior to admission, currently on ASA 325mg  daily given moderate sized stroke with petechial hemorrhage. Will consider AC in a couple of days if neuro stable Therapy recommendations:  Pending  Disposition:  TBD Transfer to floor today.   Atrial fibrillation S/p attempted cardioversion by EMS Initially in SR until afternoon of 9/5 when noted to be in afib  Unclear compliance with home Eliquis: pharmacy tech noted that the son does not know when the last eliquis was taken. There was a bottle in the bag of medications brought in by son with only one pill left but the label was smeared so the fill date couldn't be seen. The last time the med was filled per the [facility] system was December 2021.  Mild  tachycardia overnight Will start IV heparin today per pharmacy protocol. On ASA initially given moderate sized stroke with petechial hemorrhage.    PRES Severe Hypertension B/p 220s/120s on presentation Home meds:  Micardis, Lopressor  Restart home meds as po intake allows  Stable now Long-term BP goal normotensive  Right arm shaking ? Rhythmic jerking RUE on admission More consistent with postural tremor this am EEG spot - no seizure LTM EEG- no seizure or epileptiform discharges seen On Keppra, will continue for now  Hyperlipidemia Home meds:  None LDL 97, goal < 70 Lipitor 40 started  Continue statin at discharge  Acute respiratory failure  S/p intubation Extubated mid morning 9/5 CCM managing and appreciated   Leukocytosis WBC 20.6->17.8->16, monitor  Afebrile, monitor fever curve  Blood cultures showing #1 NG at 2 days and the #2 showing staph epidermis which is felt likely to be contaminant Abd xray no acute findings CXR: Minimal atelectasis or interstitial opacities in the right mid lung and left lung base.  Feeding/swallow status Passed for dysphagia diet SLIV and monitor intake   Hypokalemia Potassium 3.2-repleted  AKI Oliguria Cr 1.7->1.25->1.37 Continue IVF No apparent urinary retention on bladder scans Monitor  History of ETOH abuse Son reports he drinks whiskey nightly Alcohol level less than 10 on admission CIWA in place  Monitor BMP/Mag Thiamine/folic acid replacement on board  Tobacco abuse Current heavy smoker Smoking cessation counseling will be provided  Other Stroke Risk Factors Advanced Age >/= 45  High risk for Obstructive sleep apnea  Other Active Problems  Hospital day # 3 This plan of care was directed by Dr. 76, NP-C  ATTENDING NOTE: I reviewed above note and agree with the assessment and plan. Pt was seen and examined.   No family at bedside.  Patient sitting in chair, extubated yesterday,  tolerating pretty well, awake alert orientated to age and place and time. No seizure on LTM EEG, which was d/ced last night. Pt presentation not consistent with seizure, will d/c keppra. Still has left facial droop and left hemiparesis but improving. Now in afib with RVR, will resume home metoprolol. Pt also very impulsive and restless, on CIWA protocol. He is 3 days out of stroke, will start anticoagulation with heparin IV. If tolerating well, will switch to DOAC in 2 days. Transfer out of ICU.   For detailed assessment and plan, please  refer to above as I have made changes wherever appropriate.   Marvel Plan, MD PhD Stroke Neurology 03/15/2021 10:08 PM  This patient is critically ill due to right MCA stroke due to right M1 occlusion, status post thrombectomy, PAF, heavy alcohol and at significant risk of neurological worsening, death form alcohol withdrawal, DT, recurrent stroke, heart failure. This patient's care requires constant monitoring of vital signs, hemodynamics, respiratory and cardiac monitoring, review of multiple databases, neurological assessment, discussion with family, other specialists and medical decision making of high complexity. I spent 35 minutes of neurocritical care time in the care of this patient.    To contact Stroke Continuity provider, please refer to [email or web address]. After hours, contact General Neurology

## 2021-03-15 NOTE — Progress Notes (Signed)
  Speech Language Pathology Treatment: Dysphagia  Patient Details Name: Patrick Padilla MRN: [id number] DOB: Jul 26, 1955 Today's Date: 03/15/2021 Time: 4235-3614 SLP Time Calculation (min) (ACUTE ONLY): 15 min  Assessment / Plan / Recommendation Clinical Impression  Pt was seen for upgraded PO trials. Pt confused by situation and with mits throughout and required cues to direct attention to feeding. Pt presents with cough/intermittent throat clearing and significant hoarseness at baseline. Pt completed oral care, and SLP trialed thin liquid and regular solids. Pt took large consecutive sips from cup and straw and showed no overt s/s of aspiration. With regular solids, pt exhibited mild oral residue that cleared with liquid wash. Recommend Dys3/thin liquid diet d/t missing dentition to aid with mastication. Administer liquids through straw per pt preference. Administer medications whole with thin liquid.    HPI HPI: 65 yo patient who presented to ED with L hemiplegia and R sided jerking after attempted cardioversion by EMS for afib with RVR. MRI acute infarct at the right basal ganglia. Few and  punctate bifrontal peripheral infarcts., recent medial left thalamic infarct which may be subacute, Posterior reversible encephalopathy in the bilateral parietooccipital cortex. Intubated 9/3-9/5. PMH:  hx EtOH abuse and  afib (unclear if on [facility]), anxiety HTN PSH: cardioversion, L THA.      SLP Plan  Continue with current plan of care  Patient needs continued Speech Lanaguage Pathology Services    Recommendations  Diet recommendations: Dysphagia 3 (mechanical soft);Thin liquid Liquids provided via: Straw Medication Administration: Whole meds with liquid Supervision: Patient able to self feed Compensations: Minimize environmental distractions Postural Changes and/or Swallow Maneuvers: Seated upright 90 degrees;Upright 30-60 min after meal                Oral Care Recommendations: Oral care BID Follow  up Recommendations: Inpatient Rehab SLP Visit Diagnosis: Dysphagia, unspecified (R13.10) Plan: Continue with current plan of care       GO             Patrick Padilla, SLP-Student    Patrick Padilla 03/15/2021, 11:22 AM

## 2021-03-15 NOTE — Progress Notes (Signed)
Inpatient Rehab Admissions Coordinator:   Per therapy recommendations,  patient was screened for CIR candidacy by Mats Jeanlouis, MS, CCC-SLP . At this time, Pt. Appears to demonstrate medical necessity, functional decline, and ability to tolerate intensity of CIR. Pt. is a potential candidate for CIR. I will place   order for rehab consult per protocol for full assessment. Please contact me any with questions..  Yuritza Paulhus, MS, CCC-SLP Rehab Admissions Coordinator  [phone] (celll) [phone] (office)  

## 2021-03-15 NOTE — Progress Notes (Addendum)
NAME:  Joas Motton, MRN:  [id number], DOB:  08/31/1955, LOS: 3 ADMISSION DATE:  03/12/2021, CONSULTATION DATE:  9/3 REFERRING MD:  Dr Selina Cooley, CHIEF COMPLAINT:  acute resp failure   History of Present Illness:  65 year old man history of tobacco use (?amount), hypertension, alcohol abuse, atrial fibrillation on Eliquis (unclear compliance).  Last seen normal 22:00 9/2, noted with altered mental status, confusion morning 9/3.  Then evolved chest discomfort diaphoresis.  EMS activated.  Some report of questionable seizure activity although unclear timing.  Found to be tachycardic, question atrial fibrillation plus RVR.  Cardioversion was attempted in the field.  On arrival to the ED patient had left hemiplegia with rhythmic jerking of the right upper extremity.  He required intubation and ventilation for impaired airway protection and hypoxemia.  TNK deferred, unclear timing window for symptom onset.  CT/angio showed possible infarct right lentiform nucleus and bilateral thalami, right M1 occlusion, right MCA infarct.  No family available.  He has been seen by neurology and is going urgently for neuro IR intervention, sedated and intubated.  Pertinent  Medical History   Past Medical History:  Diagnosis Date   Anxiety    Elevated LFTs    History of colon polyps    HLD (hyperlipidemia)    HTN (hypertension)    Localized superficial swelling, mass, or lump     Significant Hospital Events: Including procedures, antibiotic start and stop dates in addition to other pertinent events   CT/angio Head >> large vessel occlusion right M1 segment, evolving acute infarct right lentiform nucleus, bilateral thalami, MCA territory.  No evidence of an acute hemorrhage.  Minimal atherosclerotic changes bilateral carotids. Head CT 9/3 >> small amount of subarachnoid hypodensity right frontal, question contrast staining, no acute hemorrhage MRI brain 9/4 > confluent acute right basal ganglia infarct, few punctate  bifrontal peripheral infarcts, subacute left thalamic infarct, bilateral paraseptal cortex hyperintensity, consider PRES.  Small volume blood products right sylvian fissure, no progression 9/5 extubated  Interim History / Subjective:   No distress in chair   Objective   Blood pressure (Abnormal) 159/100, pulse (Abnormal) 102, temperature 98.5 F (36.9 C), temperature source Oral, resp. rate 20, height 6' (1.829 m), weight 90 kg, SpO2 92 %.    Vent Mode: Stand-by   Intake/Output Summary (Last 24 hours) at 03/15/2021 1005 Last data filed at 03/15/2021 0700 Gross per 24 hour  Intake 1856.96 ml  Output 1100 ml  Net 756.96 ml   Filed Weights   03/12/21 1540  Weight: 90 kg    Examination: General 65 year old male patient is sitting up in the chair and in no acute distress this morning HEENT normocephalic atraumatic no jugular venous distention mucous membranes moist Pulmonary: Clear to auscultation currently on room air Cardiac: Regular irregular atrial fibrillation noted on telemetry Extremities warm dry Neuro: Awake, oriented x2, impulsive, left-sided weakness GU: Incontinent of urine   Resolved Hospital Problem list   Acute respiratory failure extubated 9/5 and resolved Acute renal insufficiency w oliguria (normalized as of 9/6)   Assessment & Plan:  Acute right lentiform nucleus, MCA territory CVA Question PRES based on MRI 9/4 Plan Normal BP goal at this point Cont asa timing of DOAC TBD Cont Keppra PT/OT/SLP and hopefully CIR  Urinary incont Plan Voiding program   Atrial fibrillation with RVR -Unclear whether he was compliant with his Eliquis as an outpatient.   Plan Resuming Oral BB ASA only at this point. Defer DOAC timing to neuro  Cont tele  Hypertension Plan Resume ARB and lopressor  Aiming for normal BP at this point  Hx EtOH use Plan Monitor for evidence of w/d On CIWA   Best Practice (right click and "Reselect all SmartList Selections"  daily)   Diet/type: dysphagia diet (see orders) DVT prophylaxis: SCD GI prophylaxis: PPI Lines: N/A Foley:  N/A Code Status:  full code Last date of multidisciplinary goals of care discussion [pending]    Critical care time:   NA   Critical care will sign off Simonne Martinet ACNP-BC [facility] Pulmonary/Critical Care Pager # [phone] OR # [phone] if no answer

## 2021-03-15 NOTE — Progress Notes (Signed)
ANTICOAGULATION CONSULT NOTE   Pharmacy Consult for Heparin Indication: atrial fibrillation and stroke  No Known Allergies  Patient Measurements: Height: 6' (182.9 cm) Weight: 90 kg (198 lb 6.6 oz) IBW/kg (Calculated) : 77.6 Heparin Dosing Weight: 90 kg  Vital Signs: Temp: 98.2 F (36.8 C) (09/06 1748) Temp Source: Oral (09/06 1748) BP: 135/85 (09/06 1748) Pulse Rate: 79 (09/06 1748)  Labs: Recent Labs    03/12/21 1910 03/12/21 2035 03/13/21 0241 03/13/21 1813 03/14/21 0405 03/15/21 0206 03/15/21 1810  HGB  --    < > 16.3  --  13.7 14.8  --   HCT  --    < > 47.6  --  40.9 43.8  --   PLT  --   --  205  --  188 192  --   APTT  --   --   --   --   --   --  37*  CREATININE  --    < > 1.23 1.25* 1.37* 0.98  --   TROPONINIHS 13  --   --   --   --   --   --    < > = values in this interval not displayed.     Estimated Creatinine Clearance: 82.5 mL/min (by C-G formula based on SCr of 0.98 mg/dL).   Medical History: Past Medical History:  Diagnosis Date   Anxiety    Elevated LFTs    History of colon polyps    HLD (hyperlipidemia)    HTN (hypertension)    Localized superficial swelling, mass, or lump    Assessment: Presented to ED with CVA/TIA, no TNK given because outside of window. Patient has PMH of atrial fibrillation possibly on Eliquis per EMS (unknown compliance).  Pharmacy consulted for heparin.  No s/sx of bleeding, CBC stable.  Due to risk of bleeding with acute CVA no bolus is indicated.   aPTT 37 secs which is subtherapeutic.  Goal of Therapy:  Heparin Level 0.3-0.5 units/mL Monitor platelets by anticoagulation protocol: Yes   Plan:  Increase Heparin infusion to 1,300 units/hr - no bolus Check Heparin level and aPTT level  Daily Heparin level and CBC (order daily aPTT if needed) Monitor for s/sx of bleeding  Jeanella Cara, PharmD, [facility] Clinical Pharmacist Please see AMION for all Pharmacists' Contact Phone Numbers 03/15/2021, 7:10 PM

## 2021-03-15 NOTE — Progress Notes (Signed)
Pt arrived and oriented to unit. Bed locked in the lowest position with call bell within reach.

## 2021-03-15 NOTE — Progress Notes (Signed)
ANTICOAGULATION CONSULT NOTE - Initial Consult  Pharmacy Consult for Heparin Indication: atrial fibrillation and stroke  No Known Allergies  Patient Measurements: Height: 6' (182.9 cm) Weight: 90 kg (198 lb 6.6 oz) IBW/kg (Calculated) : 77.6 Heparin Dosing Weight: 90 kg  Vital Signs: Temp: 98.9 F (37.2 C) (09/06 1111) Temp Source: Oral (09/06 1111) BP: 162/113 (09/06 1000) Pulse Rate: 114 (09/06 1000)  Labs: Recent Labs    03/12/21 1546 03/12/21 1600 03/12/21 1910 03/12/21 2035 03/13/21 0241 03/13/21 1813 03/14/21 0405 03/15/21 0206  HGB  --    < >  --    < > 16.3  --  13.7 14.8  HCT  --    < >  --    < > 47.6  --  40.9 43.8  PLT  --   --   --   --  205  --  188 192  CREATININE  --   --   --   --  1.23 1.25* 1.37* 0.98  CKTOTAL 130  --   --   --   --   --   --   --   TROPONINIHS 11  --  13  --   --   --   --   --    < > = values in this interval not displayed.    Estimated Creatinine Clearance: 82.5 mL/min (by C-G formula based on SCr of 0.98 mg/dL).   Medical History: Past Medical History:  Diagnosis Date   Anxiety    Elevated LFTs    History of colon polyps    HLD (hyperlipidemia)    HTN (hypertension)    Localized superficial swelling, mass, or lump    Assessment: Presented to ED with CVA/TIA, no TNK given because outside of window. Patient has PMH of atrial fibrillation possibly on Eliquis per EMS (unknown compliance).  Pharmacy consulted for heparin.  No s/sx of bleeding, CBC stable.  Due to risk of bleeding with acute CVA no bolus is indicated.   Goal of Therapy:  Heparin Level 0.3-0.5 units/mL Monitor platelets by anticoagulation protocol: Yes   Plan:  D/C heparin SQ Initiate Heparin infusion @ 1,100 units/hr - no bolus Check Heparin level and aPTT level @ 1830 Daily Heparin level and CBC (order daily aPTT if needed) Monitor for s/sx of bleeding  Feliberto Gottron, PharmD candidate 03/15/2021,11:46 AM

## 2021-03-16 ENCOUNTER — Inpatient Hospital Stay (HOSPITAL_COMMUNITY): Payer: Medicare Other

## 2021-03-16 DIAGNOSIS — Z043 Encounter for examination and observation following other accident: Secondary | ICD-10-CM | POA: Diagnosis not present

## 2021-03-16 DIAGNOSIS — F17200 Nicotine dependence, unspecified, uncomplicated: Secondary | ICD-10-CM

## 2021-03-16 LAB — BASIC METABOLIC PANEL
Anion gap: 11 (ref 5–15)
BUN: 11 mg/dL (ref 8–23)
CO2: 22 mmol/L (ref 22–32)
Calcium: 8.6 mg/dL — ABNORMAL LOW (ref 8.9–10.3)
Chloride: 104 mmol/L (ref 98–111)
Creatinine, Ser: 0.81 mg/dL (ref 0.61–1.24)
GFR, Estimated: >60 mL/min (ref >60)
Glucose, Bld: 93 mg/dL (ref 70–99)
Potassium: 3.6 mmol/L (ref 3.5–5.1)
Sodium: 137 mmol/L (ref 135–145)

## 2021-03-16 LAB — GLUCOSE, CAPILLARY
Glucose-Capillary: 105 mg/dL — ABNORMAL HIGH (ref 70–99)
Glucose-Capillary: 114 mg/dL — ABNORMAL HIGH (ref 70–99)
Glucose-Capillary: 117 mg/dL — ABNORMAL HIGH (ref 70–99)
Glucose-Capillary: 89 mg/dL (ref 70–99)
Glucose-Capillary: 91 mg/dL (ref 70–99)

## 2021-03-16 LAB — CBC
HCT: 45 % (ref 39.0–52.0)
Hemoglobin: 15.2 g/dL (ref 13.0–17.0)
MCH: 34.3 pg — ABNORMAL HIGH (ref 26.0–34.0)
MCHC: 33.8 g/dL (ref 30.0–36.0)
MCV: 101.6 fL — ABNORMAL HIGH (ref 80.0–100.0)
Platelets: 181 10*3/uL (ref 150–400)
RBC: 4.43 MIL/uL (ref 4.22–5.81)
RDW: 13 % (ref 11.5–15.5)
WBC: 10.1 10*3/uL (ref 4.0–10.5)
nRBC: 0 % (ref 0.0–0.2)

## 2021-03-16 LAB — HEPARIN LEVEL (UNFRACTIONATED)
Heparin Unfractionated: <0.1 IU/mL — ABNORMAL LOW (ref 0.30–0.70)
Heparin Unfractionated: <0.1 IU/mL — ABNORMAL LOW (ref 0.30–0.70)
Heparin Unfractionated: 0.13 IU/mL — ABNORMAL LOW (ref 0.30–0.70)

## 2021-03-16 LAB — APTT: aPTT: 33 seconds (ref 24–36)

## 2021-03-16 LAB — MAGNESIUM: Magnesium: 1.8 mg/dL (ref 1.7–2.4)

## 2021-03-16 NOTE — Progress Notes (Signed)
Physical Therapy Treatment Patient Details Name: Patrick Padilla MRN: [id number] DOB: January 31, 1956 Today's Date: 03/16/2021    History of Present Illness 65 yo patient who presented to ED with L hemiplegia and R sided jerking after attempted cardioversion by EMS for afib with RVR.  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 Few and  punctate bifrontal peripheral infarcts. Recent medial left thalamic infarct which may be subacute. 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 Extubated 9/5. PMH:  hx EtOH abuse and  afib (unclear if on AC), anxiety HTN PSH: cardioversion, L THA    PT Comments    Pt received in supine, agreeable to therapy session with encouragement, with good participation and fair tolerance for transfer training at bedside. Pt remains distracted and needs moderate single step cues with increased processing time to perform all transfers and +2 min/modA physical assist. VSS on RA although tele leads disconnecting during pivot transfer so HR not fully captured (80-90's bpm while seated). Pt up in chair with posey belt alarm attached and active for safety at end of session, pt able to demo back technique to notify RN with call bell prior to getting up. Pt continues to benefit from PT services to progress toward functional mobility goals.     Follow Up Recommendations  CIR     Equipment Recommendations   (TBD at next venue)    Recommendations for Other Services Rehab consult     Precautions / Restrictions Precautions Precautions: Fall Restrictions Weight Bearing Restrictions: No    Mobility  Bed Mobility Overal bed mobility: Needs Assistance Bed Mobility: Supine to Sit     Supine to sit: Mod assist;+2 for physical assistance     General bed mobility comments: max directional verbal cues, tactile cues to stay on task, modA for LE mangement and trunk elevation as pt with difficulty processing directions/follow commands     Transfers Overall transfer level: Needs assistance Equipment used: 2 person hand held assist Transfers: Sit to/from Stand;Stand Pivot Transfers Sit to Stand: Mod assist;+2 physical assistance Stand pivot transfers: +2 physical assistance;Min assist       General transfer comment: tactile cues to power up, modA x2 to steady, pt with posterior lean, bracing on the bed via back of legs, pt with wide base of support and short shuffled steps to the chair. Slightly improved upright posture today but looks downward despite cues for upright gaze.  Ambulation/Gait             General Gait Details: unsafe this date, pt fatigued once in chair and RN in room to assess IV which was dripping      Modified Rankin (Stroke Patients Only) Modified Rankin (Stroke Patients Only) Pre-Morbid Rankin Score: No significant disability Modified Rankin: Moderately severe disability     Balance Overall balance assessment: Needs assistance Sitting-balance support: Feet supported;Bilateral upper extremity supported Sitting balance-Leahy Scale: Poor     Standing balance support: Bilateral upper extremity supported;During functional activity Standing balance-Leahy Scale: Poor Standing balance comment: dependent on physical assist +1-2           Cognition Arousal/Alertness: Awake/alert Behavior During Therapy: Impulsive Overall Cognitive Status: Impaired/Different from baseline Area of Impairment: Orientation;Attention;Memory;Following commands;Safety/judgement;Awareness;Problem solving                 Orientation Level: Disoriented to;Time (stated hospital and stroke) Current Attention Level: Focused Memory: Decreased short-term memory;Decreased recall of precautions Following Commands: Follows one step commands with increased time;Follows one step commands inconsistently Safety/Judgement:  Decreased awareness of safety;Decreased awareness of deficits (impulsive) Awareness: Emergent  (states "i have to go to the bathroom" but couldn't control it) Problem Solving: Slow processing;Decreased initiation;Difficulty sequencing;Requires verbal cues;Requires tactile cues General Comments: pt needs 1-step cues for safety with each mobility task; able to state location and DOB/name, decreased insight into deficits and remains somewhat impulsive although less than in previous session.      Exercises      General Comments General comments (skin integrity, edema, etc.): VSS on RA, HR elevated to 120's bpm      Pertinent Vitals/Pain Pain Assessment: Faces Faces Pain Scale: Hurts a little bit Pain Location: at L hand IV site Pain Descriptors / Indicators: Discomfort Pain Intervention(s): Limited activity within patient's tolerance;Monitored during session;Repositioned;Other (comment) (RN aware)    Home Living                      Prior Function            PT Goals (current goals can now be found in the care plan section) Acute Rehab PT Goals Patient Stated Goal: unable to state PT Goal Formulation: With patient/family Time For Goal Achievement: 03/27/21 Potential to Achieve Goals: Fair Progress towards PT goals: Progressing toward goals    Frequency    Min 4X/week      PT Plan Current plan remains appropriate    Co-evaluation              AM-PAC PT "6 Clicks" Mobility   Outcome Measure  Help needed turning from your back to your side while in a flat bed without using bedrails?: A Little Help needed moving from lying on your back to sitting on the side of a flat bed without using bedrails?: A Lot Help needed moving to and from a bed to a chair (including a wheelchair)?: A Lot Help needed standing up from a chair using your arms (e.g., wheelchair or bedside chair)?: A Lot Help needed to walk in hospital room?: A Lot (+2 assist) Help needed climbing 3-5 steps with a railing? : Total 6 Click Score: 12    End of Session Equipment Utilized  During Treatment: Gait belt Activity Tolerance: Patient tolerated treatment well;Patient limited by fatigue Patient left: in chair;with call bell/phone within reach;with chair alarm set;Other (comment);with nursing/sitter in room (posey belt chair alarm on) Nurse Communication: Mobility status PT Visit Diagnosis: Unsteadiness on feet (R26.81);Difficulty in walking, not elsewhere classified (R26.2)     Time: 3810-1751 PT Time Calculation (min) (ACUTE ONLY): 21 min  Charges:  $Therapeutic Activity: 8-22 mins                     Patrick Dethloff P., PTA Acute Rehabilitation Services Pager: [phone] Office: [phone]    Patrick Padilla 03/16/2021, 5:00 PM

## 2021-03-16 NOTE — Progress Notes (Signed)
ANTICOAGULATION CONSULT NOTE   Pharmacy Consult for Heparin Indication: atrial fibrillation and stroke  No Known Allergies  Patient Measurements: Height: 6' (182.9 cm) Weight: 90 kg (198 lb 6.6 oz) IBW/kg (Calculated) : 77.6 Heparin Dosing Weight: 90 kg  Vital Signs: Temp: 98.9 F (37.2 C) (09/07 0310) Temp Source: Oral (09/07 0310) BP: 163/94 (09/07 0310) Pulse Rate: 80 (09/07 0310)  Labs: Recent Labs    03/13/21 1813 03/14/21 0405 03/14/21 0405 03/15/21 0206 03/15/21 1810 03/16/21 0246  HGB  --  13.7   < > 14.8  --  15.2  HCT  --  40.9  --  43.8  --  45.0  PLT  --  188  --  192  --  181  APTT  --   --   --   --  37*  --   HEPARINUNFRC  --   --   --   --   --  <0.10*  CREATININE 1.25* 1.37*  --  0.98  --   --    < > = values in this interval not displayed.     Estimated Creatinine Clearance: 82.5 mL/min (by C-G formula based on SCr of 0.98 mg/dL).   Medical History: Past Medical History:  Diagnosis Date   Anxiety    Elevated LFTs    History of colon polyps    HLD (hyperlipidemia)    HTN (hypertension)    Localized superficial swelling, mass, or lump    Assessment: Presented to ED with CVA/TIA, no TNK given because outside of window. Patient has PMH of atrial fibrillation possibly on Eliquis per EMS (unknown compliance).  Pharmacy consulted for heparin.  No s/sx of bleeding, CBC stable.  Due to risk of bleeding with acute CVA no bolus is indicated.   Heparin level undetectable (first check) on gtt at 1300 units/hr. No need for further PTT monitoring. No issues with line or bleeding reported per RN.  Goal of Therapy:  Heparin Level 0.3-0.5 units/mL Monitor platelets by anticoagulation protocol: Yes   Plan:  Increase Heparin infusion to 1500 units/hr Will f/u 6 hr heparin level  Christoper Fabian, PharmD, BCPS Please see amion for complete clinical pharmacist phone list 03/16/2021, 4:06 AM

## 2021-03-16 NOTE — Progress Notes (Signed)
ANTICOAGULATION CONSULT NOTE   Pharmacy Consult for Heparin Indication: atrial fibrillation and stroke  No Known Allergies  Patient Measurements: Height: 6' (182.9 cm) Weight: 90 kg (198 lb 6.6 oz) IBW/kg (Calculated) : 77.6 Heparin Dosing Weight: 90 kg  Vital Signs: Temp: 97.8 F (36.6 C) (09/07 1116) Temp Source: Oral (09/07 1116) BP: 147/83 (09/07 1116) Pulse Rate: 76 (09/07 1116)  Labs: Recent Labs    03/14/21 0405 03/15/21 0206 03/15/21 1810 03/16/21 0246 03/16/21 1106  HGB 13.7 14.8  --  15.2  --   HCT 40.9 43.8  --  45.0  --   PLT 188 192  --  181  --   APTT  --   --  37* 33  --   HEPARINUNFRC  --   --   --  <0.10* <0.10*  CREATININE 1.37* 0.98  --  0.81  --      Estimated Creatinine Clearance: 99.8 mL/min (by C-G formula based on SCr of 0.81 mg/dL).   Medical History: Past Medical History:  Diagnosis Date   Anxiety    Elevated LFTs    History of colon polyps    HLD (hyperlipidemia)    HTN (hypertension)    Localized superficial swelling, mass, or lump    Assessment: Presented to ED with CVA/TIA, no TNK given because outside of window. Patient has PMH of atrial fibrillation possibly on Eliquis per EMS (unknown compliance).  Pharmacy consulted for heparin.  No s/sx of bleeding noted, CBC stable.  Due to risk of bleeding with acute CVA no bolus is indicated.   Heparin level undetectable again, however, RN found that the line was leaking. Per RN, has been fixed.  Goal of Therapy:  Heparin Level 0.3-0.5 units/mL Monitor platelets by anticoagulation protocol: Yes   Plan:  No Bolus w/ acute CVA Continue Heparin infusion at 1500 units/hr Recheck anti-Xa level in 6 hours and daily while on heparin Continue to monitor H&H and platelets    Thank you for allowing [place] to participate in this patients care. Signe Colt, PharmD 03/16/2021 11:49 AM  Please check [email or web address] for unit-specific pharmacy phone numbers.

## 2021-03-16 NOTE — Progress Notes (Signed)
Physical Therapy Treatment Patient Details Name: Patrick Padilla MRN: [id number] DOB: January 16, 1956 Today's Date: 03/16/2021    History of Present Illness 65 yo patient who presented to ED with L hemiplegia and R sided jerking after attempted cardioversion by EMS for afib with RVR.  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 Few and  punctate bifrontal peripheral infarcts. Recent medial left thalamic infarct which may be subacute. 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 Extubated 9/5. PMH:  hx EtOH abuse and  afib (unclear if on AC), anxiety HTN PSH: cardioversion, L THA    PT Comments    Pt received in recliner, attempting to remove posey lap alarm and alarm sounding. Pt agreeable to participate in pre-gait training and return to bed. Pt needing increased assist +73modA for standing balance due to fatigue after sitting up in chair and with posterior lean throughout. Will plan to assess gait with RW support next session and chair follow for safety for progression. Pt continues to benefit from PT services to progress toward functional mobility goals and reports he is agreeable to short term high intensity therapy to progress functional mobility prior to return home.    Follow Up Recommendations  CIR     Equipment Recommendations   (TBD at next venue)    Recommendations for Other Services Rehab consult     Precautions / Restrictions Precautions Precautions: Fall Restrictions Weight Bearing Restrictions: No    Mobility  Bed Mobility Overal bed mobility: Needs Assistance Bed Mobility: Rolling;Sit to Sidelying Rolling: Min assist   Supine to sit: Mod assist;+2 for physical assistance   Sit to sidelying: Min assist;+2 for physical assistance General bed mobility comments: increased time to process 1-step cues for reverse log roll and needs minA to get each LE over EOB; flat HOB, minA to rotate hips to return supine; pt able to pull  himself up toward [facility] with hand over hand cues and minA.    Transfers Overall transfer level: Needs assistance Equipment used: 2 person hand held assist Transfers: Sit to/from [employer] Sit to Stand: Mod assist;+2 physical assistance Stand pivot transfers: +2 physical assistance;Mod assist       General transfer comment: tactile cues to power up, modA x2 to steady, pt with posterior lean, pt with increased lean in afternoon and more difficulty picking feet up, needs +2 modA for return transfer to EOB; also performed seated scooting toward [facility] with mod cues and +2 min/modA via bed pad  Ambulation/Gait Ambulation/Gait assistance: Mod assist;+2 physical assistance Gait Distance (Feet): 5 Feet (pre-gait marching x10 reps, seated break, then 15ft from chair>bed.) Assistive device: 2 person hand held assist Gait Pattern/deviations: Step-to pattern;Leaning posteriorly;Shuffle;Decreased step length - right;Decreased step length - left Gait velocity: grossly <0.4 m/s   General Gait Details: downward gaze, pt able to perform forward/retro steps and standing hip flexion. Pt took seated break then lateral steps toward bed (bed moved further away prior to transfer to see if he could progress distance), pt needed to sit after ~39ft. Increased time to perform; may be able to trial RW next session to see if this helps posture   Stairs             Wheelchair Mobility    Modified Rankin (Stroke Patients Only) Modified Rankin (Stroke Patients Only) Pre-Morbid Rankin Score: No significant disability Modified Rankin: Moderately severe disability     Balance Overall balance assessment: Needs assistance Sitting-balance support: Feet supported;Bilateral upper extremity supported Sitting balance-Leahy Scale:  Poor     Standing balance support: Bilateral upper extremity supported;During functional activity Standing balance-Leahy Scale: Poor Standing balance comment: dependent on  physical assist +1-2                            Cognition Arousal/Alertness: Awake/alert Behavior During Therapy: Impulsive Overall Cognitive Status: Impaired/Different from baseline Area of Impairment: Orientation;Attention;Memory;Following commands;Safety/judgement;Awareness;Problem solving                 Orientation Level: Disoriented to;Time (stated hospital and stroke) Current Attention Level: Focused Memory: Decreased short-term memory;Decreased recall of precautions Following Commands: Follows one step commands with increased time;Follows one step commands inconsistently Safety/Judgement: Decreased awareness of safety;Decreased awareness of deficits (impulsive) Awareness: Emergent;Intellectual Problem Solving: Slow processing;Decreased initiation;Difficulty sequencing;Requires verbal cues;Requires tactile cues General Comments: pt needs 1-step cues for safety with each mobility task; pt impulsive to get out of chair but decreased short term memory from discussion with PTA 90 mins earlier that he needs to press call bell. Pt received in chair trying to remove Posey lap alarm.      Exercises Other Exercises Other Exercises: seated BLE AROM: ankle pumps, LAQ x5 reps ea for warm-up, pt distracted needs multimodal cues    General Comments General comments (skin integrity, edema, etc.): VSS on RA, no acute s/sx distress      Pertinent Vitals/Pain Pain Assessment: No/denies pain Faces Pain Scale: Hurts a little bit Pain Location: at L hand IV site Pain Descriptors / Indicators: Discomfort Pain Intervention(s): Limited activity within patient's tolerance;Monitored during session;Repositioned    Home Living                      Prior Function            PT Goals (current goals can now be found in the care plan section) Acute Rehab PT Goals Patient Stated Goal: to get stronger and go home PT Goal Formulation: With patient/family Time For Goal  Achievement: 03/27/21 Potential to Achieve Goals: Fair Progress towards PT goals: Progressing toward goals    Frequency    Min 4X/week      PT Plan Current plan remains appropriate    Co-evaluation              AM-PAC PT "6 Clicks" Mobility   Outcome Measure  Help needed turning from your back to your side while in a flat bed without using bedrails?: A Little Help needed moving from lying on your back to sitting on the side of a flat bed without using bedrails?: A Lot Help needed moving to and from a bed to a chair (including a wheelchair)?: A Lot Help needed standing up from a chair using your arms (e.g., wheelchair or bedside chair)?: A Lot Help needed to walk in hospital room?: A Lot (+2 assist) Help needed climbing 3-5 steps with a railing? : Total 6 Click Score: 12    End of Session Equipment Utilized During Treatment: Gait belt Activity Tolerance: Patient tolerated treatment well Patient left: with call bell/phone within reach;in bed;with bed alarm set (heels floated) Nurse Communication: Mobility status PT Visit Diagnosis: Unsteadiness on feet (R26.81);Difficulty in walking, not elsewhere classified (R26.2)     Time: 6812-7517 PT Time Calculation (min) (ACUTE ONLY): 11 min  Charges:  $Gait Training: 8-22 mins                    Konstance Happel P., PTA Acute Rehabilitation  Services Pager: [phone] Office: [phone]    Dorathy Kinsman Joye Wesenberg 03/16/2021, 5:42 PM

## 2021-03-16 NOTE — Plan of Care (Signed)
  Problem: Education: Goal: Knowledge of disease or condition will improve Outcome: Progressing Goal: Knowledge of secondary prevention will improve Outcome: Progressing Goal: Knowledge of patient specific risk factors addressed and post discharge goals established will improve Outcome: Progressing Goal: Individualized Educational Video(s) Outcome: Progressing   Problem: Coping: Goal: Will verbalize positive feelings about self Outcome: Progressing Goal: Will identify appropriate support needs Outcome: Progressing   Problem: Self-Care: Goal: Ability to participate in self-care as condition permits will improve Outcome: Progressing Goal: Verbalization of feelings and concerns over difficulty with self-care will improve Outcome: Progressing   Problem: Ischemic Stroke/TIA Tissue Perfusion: Goal: Complications of ischemic stroke/TIA will be minimized Outcome: Progressing   Problem: Education: Goal: Knowledge of disease or condition will improve Outcome: Progressing Goal: Understanding of discharge needs will improve Outcome: Progressing   Problem: Health Behavior/Discharge Planning: Goal: Ability to identify changes in lifestyle to reduce recurrence of condition will improve Outcome: Progressing Goal: Identification of resources available to assist in meeting health care needs will improve Outcome: Progressing   Problem: Physical Regulation: Goal: Complications related to the disease process, condition or treatment will be avoided or minimized Outcome: Progressing   Problem: Safety: Goal: Ability to remain free from injury will improve Outcome: Progressing

## 2021-03-16 NOTE — Progress Notes (Signed)
ANTICOAGULATION CONSULT NOTE   Pharmacy Consult for Heparin Indication: atrial fibrillation and stroke  No Known Allergies  Patient Measurements: Height: 6' (182.9 cm) Weight: 90 kg (198 lb 6.6 oz) IBW/kg (Calculated) : 77.6 Heparin Dosing Weight: 90 kg  Vital Signs: Temp: 98.1 F (36.7 C) (09/07 2000) Temp Source: Oral (09/07 2000) BP: 154/89 (09/07 2000) Pulse Rate: 84 (09/07 2000)  Labs: Recent Labs    03/14/21 0405 03/15/21 0206 03/15/21 1810 03/16/21 0246 03/16/21 1106 03/16/21 1856  HGB 13.7 14.8  --  15.2  --   --   HCT 40.9 43.8  --  45.0  --   --   PLT 188 192  --  181  --   --   APTT  --   --  37* 33  --   --   HEPARINUNFRC  --   --   --  <0.10* <0.10* 0.13*  CREATININE 1.37* 0.98  --  0.81  --   --      Estimated Creatinine Clearance: 99.8 mL/min (by C-G formula based on SCr of 0.81 mg/dL).   Medical History: Past Medical History:  Diagnosis Date   Anxiety    Elevated LFTs    History of colon polyps    HLD (hyperlipidemia)    HTN (hypertension)    Localized superficial swelling, mass, or lump    Assessment: Presented to ED with CVA/TIA, no TNK given because outside of window. Patient has PMH of atrial fibrillation possibly on Eliquis per EMS (unknown compliance).  Pharmacy consulted for heparin.  No s/sx of bleeding noted, CBC stable.  Due to risk of bleeding with acute CVA no bolus is indicated.   9/7 20:13- HL 0.13 units/mL Followed up with nurse to ensure infusing correctly  Goal of Therapy:  Heparin Level 0.3-0.5 units/mL Monitor platelets by anticoagulation protocol: Yes   Plan:  Increase Heparin infusion to 1700 units/hr Recheck anti-Xa level with AML and daily while on heparin Continue to monitor H&H and platelets  Tomie China, PharmD Clinical Pharmacist  Please check AMION for all [facility] Pharmacy numbers After 10:00 PM, call Main Pharmacy [phone]

## 2021-03-16 NOTE — Progress Notes (Addendum)
STROKE TEAM PROGRESS NOTE    BRIEF HPI: 65 yo patient with hx EtOH abuse and a fib (unclear if on [facility]) who presented to ED with L hemiplegia and R sided jerking after attempted cardioversion by EMS for a fib with RVR. On arrival to ED per EDP patient was hemiplegic on the left with rhythmic jerking RUE. Intubated for respiratory failure with paralytics on board. Outside window for TNK. CTH possible subtle loss of gray-white differentiation in R lentiform nucleus and bilateral thalami ASPECTS 9/10. CTA showed R M1 occlusion. CTP showed R MCA infarct core 52m with penumbra of 166m   INTERVAL HISTORY: No acute events overnight. Today he is complaining of right shoulder pain with movement of his arm. He reports a fall prior to admission. He has had shoulder pain in the past as well. He otherwise denies new complaints or concerns  Vitals:   03/15/21 2150 03/15/21 2325 03/16/21 0310 03/16/21 0725  BP: (!) 160/94 (!) 164/109 (!) 163/94 (!) 158/98  Pulse: 92 87 80 74  Resp:  _0 Temp:  97.7 F (36.5 C) 98.9 F (37.2 C) 97.8 F (36.6 C)  TempSrc:  Oral Oral Oral  SpO2:  95% 95% 98%  Weight:      Height:       CBC:  Recent Labs  Lab 03/12/21 1540 03/12/21 1600 03/15/21 0206 03/16/21 0246  WBC 20.6*   < > 10.9* 10.1  NEUTROABS 18.2*  --   --   --   HGB 18.6*   < > 14.8 15.2  HCT 53.2*   < > 43.8 45.0  MCV 100.0   < > 102.3* 101.6*  PLT 217   < > 192 181   < > = values in this interval not displayed.   Basic Metabolic Panel:  Recent Labs  Lab 03/15/21 0206 03/16/21 0246  NA 140 137  K 3.2* 3.6  CL 108 104  CO2 22 22  GLUCOSE 80 93  BUN 15 11  CREATININE 0.98 0.81  CALCIUM 8.3* 8.6*  MG 1.9 1.8   Lipid Panel:  Recent Labs  Lab 03/13/21 0241  CHOL 162  TRIG 92  92  HDL 47  CHOLHDL 3.4  VLDL 18  LDLCALC 97   HgbA1c:  Recent Labs  Lab 03/13/21 0241  HGBA1C 5.4   Urine Drug Screen: No results for input(s): LABOPIA, COCAINSCRNUR, LABBENZ, AMPHETMU, THCU,  LABBARB in the last 168 hours.  Alcohol Level  Recent Labs  Lab 03/12/21 1540  ETH <10   IMAGING past 24 hours IR USKoreauide Vasc Access Right  Result Date: 03/15/2021 INDICATION: [age]ear old male with history significant for EtOH abuse and atrial fibrillation presenting with left hemiplegia, right sided jerking after attempted cardioversion by EMS for afib with RVR. His last known well was 10 p.m. on March 11, 2021. NIHSS at presentation 26; premorbid Rankin scale 2. Head CT showed a right basal ganglia infarct (ASPECTS 9). CT angiogram of the head and neck showed an occlusion of the right M1/MCA segment with poor collaterals. He was transferred to our service for a diagnostic cerebral angiogram and mechanical thrombectomy. EXAM: GUIDED VASCULAR ACCESS DIAGNOSTIC CEREBRAL ANGIOGRAM MECHANICAL THROMBECTOMY FLAT PANEL HEAD CT COMPARISON:  CT angiogram of the head and neck March 12, 2021 MEDICATIONS: No antibiotic administered. ANESTHESIA/SEDATION: The procedure was performed under general anesthesia. CONTRAST:  65 mL of Omnipaque 240 milligram/mL FLUOROSCOPY TIME:  Fluoroscopy Time: 21 minutes 48 seconds (841 mGy). COMPLICATIONS: None immediate. TECHNIQUE: Informed  written consent was not obtained. Multiple attempts to reach next of kin were unsuccessful. Discussion held with Dr. Quinn Axe (neurology) and decision made to proceed with mechanical thrombectomy given imminent risk of significant morbidity and mortality in the absence of emergency endovascular treatment of right MCA occlusion. Maximal Sterile Barrier Technique was utilized including caps, mask, sterile gowns, sterile gloves, sterile drape, hand hygiene and skin antiseptic. A timeout was performed prior to the initiation of the procedure. The right groin was prepped and draped in the usual sterile fashion. Using a micropuncture kit and the modified Seldinger technique, access was gained to the right common femoral artery and an 8 French sheath  was placed. Real-time ultrasound guidance was utilized for vascular access including the acquisition of a permanent ultrasound image documenting patency of the accessed vessel. Under fluoroscopy, a Zoom 88 guide catheter was navigated over a 6 [place] Berenstein 2 catheter and a 0.035" Terumo Glidewire into the aortic arch. The catheter was placed into the right common carotid artery and then advanced into the right internal carotid artery. The inner catheter was removed. Frontal and lateral angiograms of the head were obtained. FINDINGS: 1. The femoral artery is patent, with adequate caliber for vascular access. 2. Occlusion of the right M1/MCA just distal to the origin of the anterior temporal branch. PROCEDURE: Under biplane roadmap, a zoom 71 aspiration catheter was navigated over an Aristotle 24 microguidewire into the cavernous segment of the right ICA. The aspiration catheter was then advanced to the level of occlusion and connected to an aspiration pump. Continuous aspiration was performed for 2 minutes. The aspiration catheter was subsequently removed under constant aspiration. The guide catheter was aspirated for debris. Due to absent back flow in guide catheter, it was withdrawn under continuous aspiration. Under fluoroscopy, the Zoom 88 guide catheter was navigated over a 6 [place] Berenstein 2 catheter and a 0.035" Terumo Glidewire into the aortic arch. The catheter was placed into the right common carotid artery and then advanced into the right internal carotid artery. The inner catheter was removed. Frontal and lateral angiograms of the head were obtained. Recanalization of the right M1/MCA segment seen with occlusion of the distal right M2/MCA anterior division branch. Under biplane roadmap, a Zoom 35 aspiration catheter was navigated over an Aristotle 14 microguidewire into the cavernous segment of the right ICA. The aspiration catheter was then advanced to the level of occlusion and connected to an  aspiration pump. Continuous aspiration was performed for 2 minutes. The guide catheter was connected to a VacLok syringe. The aspiration catheter was subsequently removed under constant aspiration. The guide catheter was aspirated for debris. Follow-up angiogram showed persistent occlusion of the distal right M3/MCA segment. Under biplane roadmap, a Zoom 35 aspiration catheter was navigated over an Aristotle 14 microguidewire into the cavernous segment of the right ICA. The aspiration catheter was then advanced to the level of occlusion in the right M3 segment and connected to an aspiration pump. Continuous aspiration was performed for 2 minutes. The guide catheter was connected to a VacLok syringe. The aspiration catheter was subsequently removed under constant aspiration. The guide catheter was aspirated for debris. Right ICA angiogram showed complete reperfusion of the right MCA vascular tree (TICI3). Flat panel CT of the head was obtained and post processed in a separate workstation with concurrent attending physician supervision. Selected images were sent to PACS. Trace subarachnoid hemorrhage in the right sylvian fissure noted. A right common femoral artery angiogram was obtained in right anterior oblique view.  The puncture is at the level of the common femoral artery which has adequate caliber for closure device. The femoral sheath was then exchanged over the wire for a Perclose pro style which was then utilized for access closure. Immediate hemostasis was achieved. IMPRESSION: 1. Successful mechanical thrombectomy performed with direct contact aspiration for treatment of a proximal right M1/MCA occlusion with complete recanalization after 3 passes (TICI 3). 2. Trace subarachnoid hemorrhage in the right sylvian fissure on postprocedural flat panel head CT. PLAN: 1. Transfer to ICU. 2. Follow-up head CT in 3 hours to ensure stability of trace subarachnoid hemorrhage in right sylvian fissure. Electronically  Signed   By: Pedro Earls M.D.   On: 03/15/2021 16:16    PHYSICAL EXAM:  Temp:  [97.7 F (36.5 C)-98.9 F (37.2 C)] 97.8 F (36.6 C) (09/07 0725) Pulse Rate:  [74-114] 74 (09/07 0725) Resp:  [16-21] 20 (09/07 0725) BP: (135-170)/(85-113) 158/98 (09/07 0725) SpO2:  [92 %-98 %] 98 % (09/07 0725)  General - Elderly male lying in bed in NAD Resp: Even and unlabored Cardiovascular - Regular rhythm and rate. No afib at present on tele.  Abdominal-large round soft, no tenderness on exam  Skin: warm and dry.  Ext: No edema Mental Status -  Awake, alert oriented to person, place, not date or time.   Cranial Nerves II - XII   PERRL. EOMI Face symmetric TML Sensation intact V1-V3 VFF Left facial droop   Motor Strength -  Moving all 4 extremities purposefully against gravity. LUE with +drift, proximal weakness 3/5, mild tenderness to palpation of shoulder region, no erythema, warmth or edema. Grip strength on the left 4/5. LLE weakness proximally with 3/5 at HF, otherwise 4+/g Sensory -Intact x 4 extremities to light touch  Coordination - unable to assess due to asses to AMS  ASSESSMENT/PLAN: 65 yo patient with hx EtOH abuse and a fib (unclear if on [facility]) who presented to ED with L hemiplegia and R sided jerking after attempted cardioversion by EMS for a fib with RVR.  Per report, patient last seen well when he went to bed at 10pm 9/2. Woke up 9/3 morning confused. Developed diaphoresis and chest discomfort in the afternoon. Called EMS who stated patient was in a fib with RVR. Attempted cardioversion en route. On arrival to ED per ED patient was hemiplegic on the left with rhythmic jerking RUE. He was intubated with rocuronium for acute hypoxic respiratory failure. EDP contacted neurology. Code Stroke activated while he was in the CT scan. He remained paralyzed inhibiting performance of a neurologic exam or NIHSS. Outside window for TNK. CTH showed possible subtle loss of  gray-white differentiation in R lentiform nucleus and bilateral thalami ASPECTS 9/10. CTA showed  emergent R M1 occlusion. CTP showed R MCA infarct core 67m with penumbra of 1665m Family was unreachable despite multiple attempts. 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  CT Possible subtle loss of gray-white differentiation in R lentiform nucleus and bilateral thalami  CTA head & neck Emergent LVO of the right M1 segment with poor collaterals. CT perfusion: R MCA infarct core 1154mith penumbra of 165m54mR 9/3 Proximal right M1/MCA occlusion s/p mechanical thrombectomy with TICI3) MRI Confluent acute infarct at the right basal ganglia. Few and punctate bifrontal peripheral infarcts. Recent medial left thalamic infarct  which may be subacute. PRES appearance in the bilateral parietooccipital cortex. Small volume blood products at the right sylvian fissure without evidence of progression from prior CT. 2D Echo EF 60-65%  EEG spot: no seizure or seizure predisposition recorded on this study.  LTM EEG This study is suggestive of moderate diffuse encephalopathy LDL 97 HgbA1c 5.2 VTE prophylaxis - heparin subq Unclear if taking Eliquis prior to admission, now on heparin IV. If tolerating well, will switch back to eliquis tomorrow Therapy recommendations: CIR Disposition:  CIR insurance auth pending  Atrial fibrillation S/p attempted cardioversion by EMS Initially in [place] until afternoon of 9/5 when noted to be in afib  Unclear compliance with home Eliquis: pharmacy tech noted that the son does not know when the last eliquis was taken. There was a bottle in the bag of medications brought in by son with only one pill left but the label was smeared so the fill date couldn't be seen. The last time the med was filled per the  [facility] system was December 2021.  Rate controlled now Pharmacy protocol IV heparin on board since 9/6 - If tolerating well, will switch back to eliquis tomorrow  PRES Severe Hypertension B/p 220s/120s on presentation Home meds:  Micardis, Lopressor  Restart home meds as po intake allows  Stable now Long-term BP goal normotensive  Right arm shaking ? Rhythmic jerking RUE on admission More consistent with postural tremor this am EEG spot - no seizure LTM EEG- no seizure or epileptiform discharges seen On Keppra initially, discontinued 9/6.   Hyperlipidemia Home meds:  None LDL 97, goal < 70 Lipitor 40 started  Continue statin at discharge  Acute respiratory failure  S/p intubation Extubated mid morning 9/5 CCM managing and appreciated   Leukocytosis WBC 20.6->17.8->16.7->10.9->10.1, monitor  Afebrile, monitor fever curve  Blood cultures showing #1 NG at 2 days and the #2 showing staph epidermis which is felt likely to be contaminant Abd xray no acute findings CXR: Minimal atelectasis or interstitial opacities in the right mid lung and left lung base.  Feeding/swallow status Passed for dysphagia diet SLIV and monitor intake   AKI Oliguria Cr 1.7->1.25->1.37->0.98->0.81 SLIV No apparent urinary retention on bladder scans Monitor  History of ETOH abuse Son reports he drinks whiskey nightly Alcohol level less than 10 on admission CIWA in place  Monitor BMP/Mag Thiamine/folic acid replacement on board  Tobacco abuse Current heavy smoker Smoking cessation counseling will be provided  Other Stroke Risk Factors Advanced Age >/= 31  High risk for Obstructive sleep apnea  Right shoulder pain Reports fall at home prior to admission Check right shoulder xray - negative for fracture or dislocation.  Other Active Problems Hypokalemia, Potassium 3.2->3.6   Hospital day # 4 This plan of care was directed by Dr. Stefanie Libel, NP-C  ATTENDING NOTE: I  reviewed above note and agree with the assessment and plan. Pt was seen and examined.   Patient sitting in chair, no acute event overnight.  Still has left arm weakness, complaining of mild left shoulder pain, per patient, he fell at home before admission, checked right shoulder x-ray which was negative for fracture or dislocation.  So far tolerating heparin IV, will consider transition to Eliquis tomorrow.  Leukocytosis and AKI and hypokalemia resolved, urine output adequate, encourage p.o. intake, continue monitoring.  PT/OT recommend CIR.  For detailed assessment and plan, please refer to above as I have made changes wherever appropriate.   Rosalin Hawking, MD PhD Stroke Neurology 03/16/2021  5:24 PM    To contact Stroke Continuity provider, please refer to [email or web address] After hours, contact General Neurology

## 2021-03-16 NOTE — Progress Notes (Addendum)
Inpatient Rehab Admissions Coordinator:   Consult received.  I met with patient at the bedside to explain CIR recommendations, goals, and expectations.  Pt appears to not fully grasp deficits (though I did wake him up so perhaps cognition wasn't reliable), and states multiple times he's waiting for his son to come pick him up and take him home.  I reviewed recommendations for 3 hr/day of therapy, and ELOS x2 weeks, dependent on progress.  Goals would likely be supervision 24/7.  I let him know we could see how he does with therapy today and reconvene this afternoon.    1217: left message for pt's son, Shanon Brow, to discuss CIR recommendations.   Shann Medal, PT, DPT Admissions Coordinator [phone] 03/16/21  11:31 AM

## 2021-03-16 NOTE — Care Management Important Message (Signed)
Important Message  Patient Details  Name: Patrick Padilla MRN: [id number] Date of Birth: 08/19/1955   Medicare Important Message Given:  Yes     Dorena Bodo 03/16/2021, 4:05 PM

## 2021-03-17 ENCOUNTER — Other Ambulatory Visit: Payer: Self-pay

## 2021-03-17 ENCOUNTER — Inpatient Hospital Stay (HOSPITAL_COMMUNITY): Payer: Medicare Other

## 2021-03-17 ENCOUNTER — Encounter (HOSPITAL_COMMUNITY): Payer: Self-pay | Admitting: Physical Medicine & Rehabilitation

## 2021-03-17 ENCOUNTER — Inpatient Hospital Stay (HOSPITAL_COMMUNITY)
Admission: RE | Admit: 2021-03-17 | Discharge: 2021-04-02 | DRG: 057 | Disposition: A | Discharge status: Home / Self Care | Payer: Medicare Other | Source: Intra-hospital | Attending: Physical Medicine & Rehabilitation | Admitting: Physical Medicine & Rehabilitation

## 2021-03-17 ENCOUNTER — Encounter (HOSPITAL_COMMUNITY): Payer: Self-pay | Admitting: Neurology

## 2021-03-17 DIAGNOSIS — I69391 Dysphagia following cerebral infarction: Secondary | ICD-10-CM | POA: Diagnosis not present

## 2021-03-17 DIAGNOSIS — Z8249 Family history of ischemic heart disease and other diseases of the circulatory system: Secondary | ICD-10-CM

## 2021-03-17 DIAGNOSIS — R1310 Dysphagia, unspecified: Secondary | ICD-10-CM | POA: Diagnosis not present

## 2021-03-17 DIAGNOSIS — R52 Pain, unspecified: Secondary | ICD-10-CM

## 2021-03-17 DIAGNOSIS — Z7901 Long term (current) use of anticoagulants: Secondary | ICD-10-CM | POA: Diagnosis not present

## 2021-03-17 DIAGNOSIS — Z79899 Other long term (current) drug therapy: Secondary | ICD-10-CM

## 2021-03-17 DIAGNOSIS — Z800 Family history of malignant neoplasm of digestive organs: Secondary | ICD-10-CM | POA: Diagnosis not present

## 2021-03-17 DIAGNOSIS — M109 Gout, unspecified: Secondary | ICD-10-CM | POA: Diagnosis not present

## 2021-03-17 DIAGNOSIS — Z8349 Family history of other endocrine, nutritional and metabolic diseases: Secondary | ICD-10-CM

## 2021-03-17 DIAGNOSIS — Z832 Family history of diseases of the blood and blood-forming organs and certain disorders involving the immune mechanism: Secondary | ICD-10-CM | POA: Diagnosis not present

## 2021-03-17 DIAGNOSIS — I63511 Cerebral infarction due to unspecified occlusion or stenosis of right middle cerebral artery: Secondary | ICD-10-CM | POA: Diagnosis not present

## 2021-03-17 DIAGNOSIS — M25512 Pain in left shoulder: Secondary | ICD-10-CM | POA: Diagnosis present

## 2021-03-17 DIAGNOSIS — R7989 Other specified abnormal findings of blood chemistry: Secondary | ICD-10-CM | POA: Diagnosis not present

## 2021-03-17 DIAGNOSIS — I10 Essential (primary) hypertension: Secondary | ICD-10-CM

## 2021-03-17 DIAGNOSIS — M19032 Primary osteoarthritis, left wrist: Secondary | ICD-10-CM | POA: Diagnosis not present

## 2021-03-17 DIAGNOSIS — D72829 Elevated white blood cell count, unspecified: Secondary | ICD-10-CM | POA: Diagnosis not present

## 2021-03-17 DIAGNOSIS — I4891 Unspecified atrial fibrillation: Secondary | ICD-10-CM | POA: Diagnosis present

## 2021-03-17 DIAGNOSIS — I69354 Hemiplegia and hemiparesis following cerebral infarction affecting left non-dominant side: Principal | ICD-10-CM

## 2021-03-17 DIAGNOSIS — E785 Hyperlipidemia, unspecified: Secondary | ICD-10-CM | POA: Diagnosis present

## 2021-03-17 DIAGNOSIS — I69398 Other sequelae of cerebral infarction: Secondary | ICD-10-CM

## 2021-03-17 DIAGNOSIS — F17210 Nicotine dependence, cigarettes, uncomplicated: Secondary | ICD-10-CM | POA: Diagnosis present

## 2021-03-17 DIAGNOSIS — I6783 Posterior reversible encephalopathy syndrome: Secondary | ICD-10-CM | POA: Diagnosis not present

## 2021-03-17 DIAGNOSIS — Z823 Family history of stroke: Secondary | ICD-10-CM | POA: Diagnosis not present

## 2021-03-17 DIAGNOSIS — R2689 Other abnormalities of gait and mobility: Secondary | ICD-10-CM | POA: Diagnosis present

## 2021-03-17 DIAGNOSIS — M25461 Effusion, right knee: Secondary | ICD-10-CM | POA: Diagnosis not present

## 2021-03-17 DIAGNOSIS — I4811 Longstanding persistent atrial fibrillation: Secondary | ICD-10-CM | POA: Diagnosis present

## 2021-03-17 DIAGNOSIS — M25561 Pain in right knee: Secondary | ICD-10-CM | POA: Diagnosis present

## 2021-03-17 DIAGNOSIS — I69319 Unspecified symptoms and signs involving cognitive functions following cerebral infarction: Secondary | ICD-10-CM

## 2021-03-17 DIAGNOSIS — H539 Unspecified visual disturbance: Secondary | ICD-10-CM | POA: Diagnosis present

## 2021-03-17 DIAGNOSIS — D72828 Other elevated white blood cell count: Secondary | ICD-10-CM | POA: Diagnosis not present

## 2021-03-17 DIAGNOSIS — E876 Hypokalemia: Secondary | ICD-10-CM | POA: Diagnosis not present

## 2021-03-17 DIAGNOSIS — F1010 Alcohol abuse, uncomplicated: Secondary | ICD-10-CM | POA: Diagnosis present

## 2021-03-17 LAB — CBC
HCT: 45 % (ref 39.0–52.0)
Hemoglobin: 15.2 g/dL (ref 13.0–17.0)
MCH: 33.9 pg (ref 26.0–34.0)
MCHC: 33.8 g/dL (ref 30.0–36.0)
MCV: 100.4 fL — ABNORMAL HIGH (ref 80.0–100.0)
Platelets: 216 10*3/uL (ref 150–400)
RBC: 4.48 MIL/uL (ref 4.22–5.81)
RDW: 12.7 % (ref 11.5–15.5)
WBC: 11 10*3/uL — ABNORMAL HIGH (ref 4.0–10.5)
nRBC: 0 % (ref 0.0–0.2)

## 2021-03-17 LAB — HEPARIN LEVEL (UNFRACTIONATED)
Heparin Unfractionated: 0.23 IU/mL — ABNORMAL LOW (ref 0.30–0.70)
Heparin Unfractionated: 0.26 IU/mL — ABNORMAL LOW (ref 0.30–0.70)

## 2021-03-17 LAB — CULTURE, BLOOD (ROUTINE X 2)
Culture: NO GROWTH
Special Requests: ADEQUATE

## 2021-03-17 LAB — GLUCOSE, CAPILLARY
Glucose-Capillary: 98 mg/dL (ref 70–99)
Glucose-Capillary: 115 mg/dL — ABNORMAL HIGH (ref 70–99)
Glucose-Capillary: 123 mg/dL — ABNORMAL HIGH (ref 70–99)

## 2021-03-17 LAB — BASIC METABOLIC PANEL
Anion gap: 8 (ref 5–15)
BUN: 9 mg/dL (ref 8–23)
CO2: 25 mmol/L (ref 22–32)
Calcium: 8.7 mg/dL — ABNORMAL LOW (ref 8.9–10.3)
Chloride: 105 mmol/L (ref 98–111)
Creatinine, Ser: 0.76 mg/dL (ref 0.61–1.24)
GFR, Estimated: >60 mL/min (ref >60)
Glucose, Bld: 99 mg/dL (ref 70–99)
Potassium: 3.5 mmol/L (ref 3.5–5.1)
Sodium: 138 mmol/L (ref 135–145)

## 2021-03-17 LAB — VITAMIN B1: Vitamin B1 (Thiamine): 176.8 nmol/L (ref 66.5–200.0)

## 2021-03-17 LAB — MAGNESIUM: Magnesium: 1.9 mg/dL (ref 1.7–2.4)

## 2021-03-17 MED ORDER — METOPROLOL TARTRATE 50 MG PO TABS: 100.0000 mg | ORAL_TABLET | Freq: Two times a day (BID) | ORAL | Status: DC

## 2021-03-17 MED ORDER — GUAIFENESIN-DM 100-10 MG/5ML PO SYRP: 5.0000 mL | ORAL_SOLUTION | Freq: Four times a day (QID) | ORAL | Status: DC | PRN

## 2021-03-17 MED ORDER — ADULT MULTIVITAMIN W/MINERALS CH: 1.0000 | ORAL_TABLET | Freq: Every day | ORAL | 3 refills | Status: DC

## 2021-03-17 MED ORDER — LORAZEPAM 1 MG PO TABS: 1.0000 mg | ORAL_TABLET | ORAL | Status: AC | PRN

## 2021-03-17 MED ORDER — IRBESARTAN 75 MG PO TABS
75.0000 mg | ORAL_TABLET | Freq: Every day | ORAL | 3 refills | Status: DC
Start: 2021-03-18 — End: 2022-07-19

## 2021-03-17 MED ORDER — ATORVASTATIN CALCIUM 40 MG PO TABS
40.0000 mg | ORAL_TABLET | Freq: Every day | ORAL | Status: DC
  Administered 2021-03-18 – 2021-04-02 (×16): 40 mg via ORAL
  Filled 2021-03-17 (×16): qty 1

## 2021-03-17 MED ORDER — IRBESARTAN 75 MG PO TABS
75.0000 mg | ORAL_TABLET | Freq: Every day | ORAL | Status: DC
  Administered 2021-03-18 – 2021-04-02 (×16): 75 mg via ORAL
  Filled 2021-03-17 (×16): qty 1

## 2021-03-17 MED ORDER — PAROXETINE HCL 20 MG PO TABS
20.0000 mg | ORAL_TABLET | Freq: Every day | ORAL | Status: DC
  Administered 2021-03-18 – 2021-04-02 (×16): 20 mg via ORAL
  Filled 2021-03-17 (×16): qty 1

## 2021-03-17 MED ORDER — SENNOSIDES-DOCUSATE SODIUM 8.6-50 MG PO TABS: 1.0000 | ORAL_TABLET | Freq: Every evening | ORAL | 3 refills | Status: DC | PRN

## 2021-03-17 MED ORDER — ALUM & MAG HYDROXIDE-SIMETH 200-200-20 MG/5ML PO SUSP: 30.0000 mL | ORAL | Status: DC | PRN

## 2021-03-17 MED ORDER — DICLOFENAC SODIUM 1 % EX GEL
2.0000 g | Freq: Four times a day (QID) | CUTANEOUS | Status: DC
  Administered 2021-03-17 – 2021-04-01 (×44): 2 g via TOPICAL
  Filled 2021-03-17: qty 100

## 2021-03-17 MED ORDER — ACETAMINOPHEN 325 MG PO TABS
325.0000 mg | ORAL_TABLET | ORAL | Status: DC | PRN
  Administered 2021-03-19 – 2021-03-27 (×4): 650 mg via ORAL
  Filled 2021-03-17 (×4): qty 2

## 2021-03-17 MED ORDER — FOLIC ACID 1 MG PO TABS
1.0000 mg | ORAL_TABLET | Freq: Every day | ORAL | Status: DC
  Administered 2021-03-18 – 2021-04-02 (×16): 1 mg via ORAL
  Filled 2021-03-17 (×16): qty 1

## 2021-03-17 MED ORDER — ADULT MULTIVITAMIN W/MINERALS CH
1.0000 | ORAL_TABLET | Freq: Every day | ORAL | Status: DC
  Administered 2021-03-18 – 2021-04-02 (×16): 1 via ORAL
  Filled 2021-03-17 (×16): qty 1

## 2021-03-17 MED ORDER — APIXABAN 5 MG PO TABS: 5.0000 mg | ORAL_TABLET | Freq: Two times a day (BID) | ORAL | 3 refills | Status: DC

## 2021-03-17 MED ORDER — PROCHLORPERAZINE EDISYLATE 10 MG/2ML IJ SOLN: 5.0000 mg | Freq: Four times a day (QID) | INTRAMUSCULAR | Status: DC | PRN

## 2021-03-17 MED ORDER — PROCHLORPERAZINE 25 MG RE SUPP: 12.5000 mg | Freq: Four times a day (QID) | RECTAL | Status: DC | PRN

## 2021-03-17 MED ORDER — THIAMINE HCL 100 MG PO TABS: 100.0000 mg | ORAL_TABLET | Freq: Every day | ORAL | 3 refills | Status: DC

## 2021-03-17 MED ORDER — LORAZEPAM 2 MG/ML IJ SOLN: 1.0000 mg | INTRAMUSCULAR | Status: AC | PRN

## 2021-03-17 MED ORDER — PAROXETINE HCL 20 MG PO TABS
20.0000 mg | ORAL_TABLET | Freq: Every day | ORAL | Status: DC
  Administered 2021-03-17: 20 mg via ORAL
  Filled 2021-03-17: qty 1

## 2021-03-17 MED ORDER — METOPROLOL TARTRATE 50 MG PO TABS
100.0000 mg | ORAL_TABLET | Freq: Two times a day (BID) | ORAL | Status: DC
  Administered 2021-03-17 – 2021-04-02 (×30): 100 mg via ORAL
  Filled 2021-03-17 (×33): qty 2

## 2021-03-17 MED ORDER — APIXABAN 5 MG PO TABS
5.0000 mg | ORAL_TABLET | Freq: Two times a day (BID) | ORAL | Status: DC
  Administered 2021-03-17: 5 mg via ORAL
  Filled 2021-03-17: qty 1

## 2021-03-17 MED ORDER — ATORVASTATIN CALCIUM 40 MG PO TABS: 40.0000 mg | ORAL_TABLET | Freq: Every day | ORAL | 3 refills | Status: DC

## 2021-03-17 MED ORDER — FOLIC ACID 1 MG PO TABS: 1.0000 mg | ORAL_TABLET | Freq: Every day | ORAL | 3 refills | Status: DC

## 2021-03-17 MED ORDER — TRAZODONE HCL 50 MG PO TABS
25.0000 mg | ORAL_TABLET | Freq: Every evening | ORAL | Status: DC | PRN
  Administered 2021-03-19 – 2021-03-31 (×6): 50 mg via ORAL
  Filled 2021-03-17 (×6): qty 1

## 2021-03-17 MED ORDER — DIPHENHYDRAMINE HCL 12.5 MG/5ML PO ELIX: 12.5000 mg | ORAL_SOLUTION | Freq: Four times a day (QID) | ORAL | Status: DC | PRN

## 2021-03-17 MED ORDER — POLYETHYLENE GLYCOL 3350 17 G PO PACK
17.0000 g | PACK | Freq: Every day | ORAL | Status: DC | PRN
  Filled 2021-03-17 (×2): qty 1

## 2021-03-17 MED ORDER — PROSOURCE PLUS PO LIQD
30.0000 mL | Freq: Two times a day (BID) | ORAL | Status: DC
  Administered 2021-03-18 – 2021-04-02 (×28): 30 mL via ORAL
  Filled 2021-03-17 (×27): qty 30

## 2021-03-17 MED ORDER — IRBESARTAN 150 MG PO TABS: 150.0000 mg | ORAL_TABLET | Freq: Every day | ORAL | Status: DC

## 2021-03-17 MED ORDER — FLEET ENEMA 7-19 GM/118ML RE ENEM: 1.0000 | ENEMA | Freq: Once | RECTAL | Status: DC | PRN

## 2021-03-17 MED ORDER — THIAMINE HCL 100 MG PO TABS
100.0000 mg | ORAL_TABLET | Freq: Every day | ORAL | Status: DC
  Administered 2021-03-18 – 2021-03-31 (×14): 100 mg via ORAL
  Filled 2021-03-17 (×14): qty 1

## 2021-03-17 MED ORDER — APIXABAN 5 MG PO TABS
5.0000 mg | ORAL_TABLET | Freq: Two times a day (BID) | ORAL | Status: DC
  Administered 2021-03-17 – 2021-04-02 (×32): 5 mg via ORAL
  Filled 2021-03-17 (×32): qty 1

## 2021-03-17 MED ORDER — BISACODYL 10 MG RE SUPP: 10.0000 mg | Freq: Every day | RECTAL | Status: DC | PRN

## 2021-03-17 MED ORDER — PROCHLORPERAZINE MALEATE 5 MG PO TABS: 5.0000 mg | ORAL_TABLET | Freq: Four times a day (QID) | ORAL | Status: DC | PRN

## 2021-03-17 NOTE — Progress Notes (Signed)
ANTICOAGULATION CONSULT NOTE   Pharmacy Consult for Heparin Indication: atrial fibrillation and stroke  No Known Allergies  Patient Measurements: Height: 6' (182.9 cm) Weight: 90 kg (198 lb 6.6 oz) IBW/kg (Calculated) : 77.6 Heparin Dosing Weight: 90 kg  Vital Signs: Temp: 98.8 F (37.1 C) (09/08 0716) Temp Source: Oral (09/08 0716) BP: 161/98 (09/08 0716) Pulse Rate: 88 (09/08 0716)  Labs: Recent Labs    03/15/21 0206 03/15/21 1810 03/16/21 0246 03/16/21 1106 03/16/21 1856 03/17/21 0018 03/17/21 0950  HGB 14.8  --  15.2  --   --  15.2  --   HCT 43.8  --  45.0  --   --  45.0  --   PLT 192  --  181  --   --  216  --   APTT  --  37* 33  --   --   --   --   HEPARINUNFRC  --   --  <0.10*   < > 0.13* 0.23* 0.26*  CREATININE 0.98  --  0.81  --   --  0.76  --    < > = values in this interval not displayed.     Estimated Creatinine Clearance: 101 mL/min (by C-G formula based on SCr of 0.76 mg/dL).  Assessment: 30 YOM presented to ED with CVA/TIA, no TNK given because outside of window. Patient has PMH of atrial fibrillation possibly on Eliquis per EMS (unknown compliance).  Pharmacy consulted for heparin.  Heparin level still slightly below goal at 0.28 units/mL. No bleeding noted and CBC stable.  No issue with infusion per RN; however, she will place a new line.  Goal of Therapy:  Heparin Level 0.3-0.5 units/mL Monitor platelets by anticoagulation protocol: Yes   Plan:  Increase heparin gtt to 2000 units/hr - no bolus with acute CVA Check 6 hr heparin level Daily heparin level and CBC  Preston Garabedian D. Laney Potash, PharmD, BCPS, BCCCP 03/17/2021, 11:34 AM

## 2021-03-17 NOTE — Progress Notes (Signed)
Pt arrived to unit via bed, pt alert. Oriented to rehab.

## 2021-03-17 NOTE — Discharge Summary (Addendum)
Stroke Discharge Summary  Patient ID: Patrick Padilla   MRN: [id number]      DOB: [birth year]  Date of Admission: 03/12/2021 Date of Discharge: 03/17/2021  Attending Physician:  Stroke, Md, MD, Stroke MD Consultant(s):   pulmonary/intensive care Dr. Court Joy Patient's PCP:  Patient, No Pcp Per (Inactive)  Discharge Diagnoses:  Right basal ganglia infarct. Few and punctate bifrontal peripheral infarcts.  Active Problems:   Atrial fibrillation with RVR (HCC)   Acute ischemic right MCA stroke (HCC)   Alcohol abuse   Seizure ([place])   Medications to be continued on Rehab Allergies as of 03/17/2021   No Known Allergies      Medication List     STOP taking these medications    naproxen 500 MG tablet Commonly known as: NAPROSYN   telmisartan 40 MG tablet Commonly known as: MICARDIS       TAKE these medications    apixaban 5 MG Tabs tablet Commonly known as: ELIQUIS Take 1 tablet (5 mg total) by mouth 2 (two) times daily. What changed: how much to take   atorvastatin 40 MG tablet Commonly known as: LIPITOR Take 1 tablet (40 mg total) by mouth daily. Start taking on: March 18, 9484   folic acid 1 MG tablet Commonly known as: FOLVITE Take 1 tablet (1 mg total) by mouth daily. Start taking on: March 18, 2021   irbesartan 75 MG tablet Commonly known as: AVAPRO Take 1 tablet (75 mg total) by mouth daily. Start taking on: March 18, 2021   metoprolol tartrate 100 MG tablet Commonly known as: LOPRESSOR Take 1 tablet by mouth twice daily   multivitamin with minerals Tabs tablet Take 1 tablet by mouth daily. Start taking on: March 18, 2021   naproxen sodium 220 MG tablet Commonly known as: ALEVE Take 220 mg by mouth daily as needed (pain/headache). As needed   PARoxetine 20 MG tablet Commonly known as: PAXIL Take 20 mg by mouth daily.   senna-docusate 8.6-50 MG tablet Commonly known as: Senokot-S Place 1 tablet into feeding tube at bedtime as needed  for moderate constipation.   thiamine 100 MG tablet Take 1 tablet (100 mg total) by mouth daily. Start taking on: March 18, 2021        LABORATORY STUDIES CBC    Component Value Date/Time   WBC 11.0 (H) 03/17/2021 0018   RBC 4.48 03/17/2021 0018   HGB 15.2 03/17/2021 0018   HCT 45.0 03/17/2021 0018   PLT 216 03/17/2021 0018   MCV 100.4 (H) 03/17/2021 0018   MCH 33.9 03/17/2021 0018   MCHC 33.8 03/17/2021 0018   RDW 12.7 03/17/2021 0018   LYMPHSABS 1.1 03/12/2021 1540   MONOABS 1.2 (H) 03/12/2021 1540   EOSABS 0.0 03/12/2021 1540   BASOSABS 0.1 03/12/2021 1540   CMP    Component Value Date/Time   NA 138 03/17/2021 0018   K 3.5 03/17/2021 0018   CL 105 03/17/2021 0018   CO2 25 03/17/2021 0018   GLUCOSE 99 03/17/2021 0018   BUN 9 03/17/2021 0018   CREATININE 0.76 03/17/2021 0018   CALCIUM 8.7 (L) 03/17/2021 0018   PROT 6.9 03/12/2021 1540   ALBUMIN 3.7 03/12/2021 1540   AST 24 03/12/2021 1540   ALT 16 03/12/2021 1540   ALKPHOS 154 (H) 03/12/2021 1540   BILITOT 2.1 (H) 03/12/2021 1540   GFRNONAA >60 03/17/2021 0018   GFRAA >60 08/27/2019 0417   COAGS Lab Results  Component Value Date  INR 1.4 (H) 08/27/2019   INR 1.1 02/04/2019   INR 0.9 12/26/2011   Lipid Panel    Component Value Date/Time   CHOL 162 03/13/2021 0241   TRIG 92 03/13/2021 0241   TRIG 92 03/13/2021 0241   HDL 47 03/13/2021 0241   CHOLHDL 3.4 03/13/2021 0241   VLDL 18 03/13/2021 0241   LDLCALC 97 03/13/2021 0241   HgbA1C  Lab Results  Component Value Date   HGBA1C 5.4 03/13/2021   Urinalysis No results found for: COLORURINE, APPEARANCEUR, LABSPEC, PHURINE, GLUCOSEU, HGBUR, BILIRUBINUR, KETONESUR, PROTEINUR, UROBILINOGEN, NITRITE, LEUKOCYTESUR Urine Drug Screen No results found for: LABOPIA, COCAINSCRNUR, LABBENZ, AMPHETMU, THCU, LABBARB  Alcohol Level    Component Value Date/Time   ETH <10 03/12/2021 1540     SIGNIFICANT DIAGNOSTIC STUDIES CT ANGIO HEAD NECK W WO  CM  Result Date: 03/12/2021 CLINICAL DATA:  Neuro deficit.  Stroke suspected. EXAM: CT ANGIOGRAPHY HEAD AND NECK TECHNIQUE: Multidetector CT imaging of the head and neck was performed using the standard protocol during bolus administration of intravenous contrast. Multiplanar CT image reconstructions and MIPs were obtained to evaluate the vascular anatomy. Carotid stenosis measurements (when applicable) are obtained utilizing NASCET criteria, using the distal internal carotid diameter as the denominator. CONTRAST:  75m OMNIPAQUE IOHEXOL 350 MG/ML SOLN COMPARISON:  CT temporal bone 05/22/2011. FINDINGS: CT HEAD FINDINGS Brain: The right lentiform nucleus is slightly hypodense compared to the left. Insular ribbon is intact. No acute cortical abnormalities are present. There is a hypoattenuation involve the thalami bilaterally. No acute hemorrhage or mass lesion is present. The ventricles are of normal size. No significant extraaxial fluid collection is present. Vascular: Hyperdense right MCA noted. Atherosclerotic calcifications are present within the cavernous internal carotid arteries bilaterally. Skull: Calvarium is intact. No focal lytic or blastic lesions are present. No significant extracranial soft tissue lesion is present. Sinuses: The paranasal sinuses and mastoid air cells are clear. Orbits: The globes and orbits are within normal limits. Review of the MIP images confirms the above findings CTA NECK FINDINGS Aortic arch: Atherosclerotic calcifications are present at the origin left subclavian artery. Minimal calcifications are present otherwise. No aneurysm or stenosis is present. Right carotid system: Right common carotid artery is within normal limits. Atherosclerotic calcifications are present bifurcation proximal right ICA without significant stenosis. More distal cervical right ICA is within normal limits. Left carotid system: Left common carotid artery is within normal limits. Minimal atherosclerotic  changes are noted at the bifurcation. Cervical left ICA is within normal limits. Vertebral arteries: Left vertebral artery is the dominant vessel. No significant stenosis is present in either vertebral artery. Both vertebral arteries originate from the subclavian arteries. Skeleton: Cervical fusion noted at C6-7. Endplate changes most evident at C3-4 and C5-6. Vertebral body heights are normal. No focal osseous lesions are present. The patient is edentulous. Other neck: The patient is intubated. Endotracheal tube in satisfactory position. Upper chest: The lung apices are clear. Thoracic inlet is within normal limits. Review of the MIP images confirms the above findings CTA HEAD FINDINGS Anterior circulation: Atherosclerotic calcifications are present within a cavernous internal carotid arteries bilaterally. No significant stenosis is present through the ICA terminus bilaterally. The A1 segments are within normal limits. Anterior communicating artery is patent. Right M1 occlusion is present with flow into a single inferior branch. Poor collaterals are present. A left MCA bifurcation is within normal limits. Left MCA branch vessels bilateral ACA branch vessels are normal. Posterior circulation: The left vertebral artery is the dominant  vessel. The left PICA origin is visualized and normal. Right AICA is dominant. Basilar artery is within normal limits. Both posterior cerebral arteries originate from the basilar tip. The PCA branch vessels are within normal limits bilaterally. Venous sinuses: Dural sinuses are patent. Straight sinus deep cerebral veins are intact. Cortical veins are unremarkable. Anatomic variants: None Review of the MIP images confirms the above findings IMPRESSION: 1. Emergent large vessel occlusion of the right M1 segment with poor collaterals. 2. Subtle loss of gray-white differentiation in the right lentiform nucleus and bilateral thalami compatible with acute infarct. ASPECTS score 9/10 3. No  acute hemorrhage. 4. Minimal atherosclerotic changes at the carotid bifurcations bilaterally without significant stenosis. 5. Atherosclerotic changes within the cavernous internal carotid arteries bilaterally without significant stenosis. 6. Aortic Atherosclerosis (ICD10-I70.0). These results were called by telephone at the time of interpretation on 03/12/2021 at 4:08 pm to provider Dr. Quinn Axe, who verbally acknowledged these results. Electronically Signed   By: San Morelle M.D.   On: 03/12/2021 16:22   CT HEAD WO CONTRAST  Result Date: 03/12/2021 CLINICAL DATA:  Stroke follow-up EXAM: CT HEAD WITHOUT CONTRAST TECHNIQUE: Contiguous axial images were obtained from the base of the skull through the vertex without intravenous contrast. COMPARISON:  None. FINDINGS: Brain: Small amount of subarachnoid hyperdensity at the right frontal operculum, likely contrast staining. No acute hemorrhage. No midline shift or other mass effect. Vascular: No hyperdense vessel or unexpected calcification. Skull: Normal. Negative for fracture or focal lesion. Sinuses/Orbits: No acute finding. Other: None. IMPRESSION: Small amount of subarachnoid hyperdensity at the right frontal operculum, likely contrast staining. No acute hemorrhage. Electronically Signed   By: Ulyses Jarred M.D.   On: 03/12/2021 22:29   MR BRAIN WO CONTRAST  Result Date: 03/13/2021 CLINICAL DATA:  Acute stroke suspected EXAM: MRI HEAD WITHOUT CONTRAST TECHNIQUE: Multiplanar, multiecho pulse sequences of the brain and surrounding structures were obtained without intravenous contrast. COMPARISON:  Head CT and CTA from yesterday FINDINGS: Brain: Confluent area of acute infarction at the right basal ganglia affecting the striatum. Tiny acute infarcts in the bilateral peripheral frontal lobes and acute or subacute infarct at the medial left thalamus. Small volume hemorrhage at the right sylvian fissure as previously seen by CT. Patchy FLAIR hyperintensity in the  bilateral occipital parietal regions. No hydrocephalus or masslike finding Vascular: Major flow voids are preserved Skull and upper cervical spine: Normal marrow signal Sinuses/Orbits: Mild sinus and mastoid opacification in the setting of intubation. Other: Subcutaneous dermal inclusion cysts in the posterior midline neck. IMPRESSION: 1. Confluent acute infarct at the right basal ganglia. Few and punctate bifrontal peripheral infarcts. 2. Recent medial left thalamic infarct which may be subacute. 3. 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 4. Small volume blood products at the right sylvian fissure without evidence of progression from prior CT. Electronically Signed   By: Monte Fantasia M.D.   On: 03/13/2021 05:08   IR CT Head Ltd  Result Date: 03/15/2021 INDICATION: 65 year old male with history significant for EtOH abuse and atrial fibrillation presenting with left hemiplegia, right sided jerking after attempted cardioversion by EMS for afib with RVR. His last known well was 10 p.m. on March 11, 2021. NIHSS at presentation 26; premorbid Rankin scale 2. Head CT showed a right basal ganglia infarct (ASPECTS 9). CT angiogram of the head and neck showed an occlusion of the right M1/MCA segment with poor collaterals. He was transferred to our service for a diagnostic cerebral angiogram and mechanical  thrombectomy. EXAM: GUIDED VASCULAR ACCESS DIAGNOSTIC CEREBRAL ANGIOGRAM MECHANICAL THROMBECTOMY FLAT PANEL HEAD CT COMPARISON:  CT angiogram of the head and neck March 12, 2021 MEDICATIONS: No antibiotic administered. ANESTHESIA/SEDATION: The procedure was performed under general anesthesia. CONTRAST:  65 mL of Omnipaque 240 milligram/mL FLUOROSCOPY TIME:  Fluoroscopy Time: 21 minutes 48 seconds (841 mGy). COMPLICATIONS: None immediate. TECHNIQUE: Informed written consent was not obtained. Multiple attempts to reach next of kin were unsuccessful.  Discussion held with Dr. Quinn Axe (neurology) and decision made to proceed with mechanical thrombectomy given imminent risk of significant morbidity and mortality in the absence of emergency endovascular treatment of right MCA occlusion. Maximal Sterile Barrier Technique was utilized including caps, mask, sterile gowns, sterile gloves, sterile drape, hand hygiene and skin antiseptic. A timeout was performed prior to the initiation of the procedure. The right groin was prepped and draped in the usual sterile fashion. Using a micropuncture kit and the modified Seldinger technique, access was gained to the right common femoral artery and an 8 French sheath was placed. Real-time ultrasound guidance was utilized for vascular access including the acquisition of a permanent ultrasound image documenting patency of the accessed vessel. Under fluoroscopy, a Zoom 88 guide catheter was navigated over a 6 [place] Berenstein 2 catheter and a 0.035" Terumo Glidewire into the aortic arch. The catheter was placed into the right common carotid artery and then advanced into the right internal carotid artery. The inner catheter was removed. Frontal and lateral angiograms of the head were obtained. FINDINGS: 1. The femoral artery is patent, with adequate caliber for vascular access. 2. Occlusion of the right M1/MCA just distal to the origin of the anterior temporal branch. PROCEDURE: Under biplane roadmap, a zoom 71 aspiration catheter was navigated over an Aristotle 24 microguidewire into the cavernous segment of the right ICA. The aspiration catheter was then advanced to the level of occlusion and connected to an aspiration pump. Continuous aspiration was performed for 2 minutes. The aspiration catheter was subsequently removed under constant aspiration. The guide catheter was aspirated for debris. Due to absent back flow in guide catheter, it was withdrawn under continuous aspiration. Under fluoroscopy, the Zoom 88 guide catheter was  navigated over a 6 [place] Berenstein 2 catheter and a 0.035" Terumo Glidewire into the aortic arch. The catheter was placed into the right common carotid artery and then advanced into the right internal carotid artery. The inner catheter was removed. Frontal and lateral angiograms of the head were obtained. Recanalization of the right M1/MCA segment seen with occlusion of the distal right M2/MCA anterior division branch. Under biplane roadmap, a Zoom 35 aspiration catheter was navigated over an Aristotle 14 microguidewire into the cavernous segment of the right ICA. The aspiration catheter was then advanced to the level of occlusion and connected to an aspiration pump. Continuous aspiration was performed for 2 minutes. The guide catheter was connected to a VacLok syringe. The aspiration catheter was subsequently removed under constant aspiration. The guide catheter was aspirated for debris. Follow-up angiogram showed persistent occlusion of the distal right M3/MCA segment. Under biplane roadmap, a Zoom 35 aspiration catheter was navigated over an Aristotle 14 microguidewire into the cavernous segment of the right ICA. The aspiration catheter was then advanced to the level of occlusion in the right M3 segment and connected to an aspiration pump. Continuous aspiration was performed for 2 minutes. The guide catheter was connected to a VacLok syringe. The aspiration catheter was subsequently removed under constant aspiration. The guide catheter was aspirated for  debris. Right ICA angiogram showed complete reperfusion of the right MCA vascular tree (TICI3). Flat panel CT of the head was obtained and post processed in a separate workstation with concurrent attending physician supervision. Selected images were sent to PACS. Trace subarachnoid hemorrhage in the right sylvian fissure noted. A right common femoral artery angiogram was obtained in right anterior oblique view. The puncture is at the level of the common femoral  artery which has adequate caliber for closure device. The femoral sheath was then exchanged over the wire for a Perclose pro style which was then utilized for access closure. Immediate hemostasis was achieved. IMPRESSION: 1. Successful mechanical thrombectomy performed with direct contact aspiration for treatment of a proximal right M1/MCA occlusion with complete recanalization after 3 passes (TICI 3). 2. Trace subarachnoid hemorrhage in the right sylvian fissure on postprocedural flat panel head CT. PLAN: 1. Transfer to ICU. 2. Follow-up head CT in 3 hours to ensure stability of trace subarachnoid hemorrhage in right sylvian fissure. Electronically Signed   By: Pedro Earls M.D.   On: 03/15/2021 16:16   IR US Guide Vasc Access Right  Result Date: 03/15/2021 INDICATION: 65 year old male with history significant for EtOH abuse and atrial fibrillation presenting with left hemiplegia, right sided jerking after attempted cardioversion by EMS for afib with RVR. His last known well was 10 p.m. on March 11, 2021. NIHSS at presentation 26; premorbid Rankin scale 2. Head CT showed a right basal ganglia infarct (ASPECTS 9). CT angiogram of the head and neck showed an occlusion of the right M1/MCA segment with poor collaterals. He was transferred to our service for a diagnostic cerebral angiogram and mechanical thrombectomy. EXAM: GUIDED VASCULAR ACCESS DIAGNOSTIC CEREBRAL ANGIOGRAM MECHANICAL THROMBECTOMY FLAT PANEL HEAD CT COMPARISON:  CT angiogram of the head and neck March 12, 2021 MEDICATIONS: No antibiotic administered. ANESTHESIA/SEDATION: The procedure was performed under general anesthesia. CONTRAST:  65 mL of Omnipaque 240 milligram/mL FLUOROSCOPY TIME:  Fluoroscopy Time: 21 minutes 48 seconds (841 mGy). COMPLICATIONS: None immediate. TECHNIQUE: Informed written consent was not obtained. Multiple attempts to reach next of kin were unsuccessful. Discussion held with Dr. Quinn Axe (neurology) and  decision made to proceed with mechanical thrombectomy given imminent risk of significant morbidity and mortality in the absence of emergency endovascular treatment of right MCA occlusion. Maximal Sterile Barrier Technique was utilized including caps, mask, sterile gowns, sterile gloves, sterile drape, hand hygiene and skin antiseptic. A timeout was performed prior to the initiation of the procedure. The right groin was prepped and draped in the usual sterile fashion. Using a micropuncture kit and the modified Seldinger technique, access was gained to the right common femoral artery and an 8 French sheath was placed. Real-time ultrasound guidance was utilized for vascular access including the acquisition of a permanent ultrasound image documenting patency of the accessed vessel. Under fluoroscopy, a Zoom 88 guide catheter was navigated over a 6 [place] Berenstein 2 catheter and a 0.035" Terumo Glidewire into the aortic arch. The catheter was placed into the right common carotid artery and then advanced into the right internal carotid artery. The inner catheter was removed. Frontal and lateral angiograms of the head were obtained. FINDINGS: 1. The femoral artery is patent, with adequate caliber for vascular access. 2. Occlusion of the right M1/MCA just distal to the origin of the anterior temporal branch. PROCEDURE: Under biplane roadmap, a zoom 71 aspiration catheter was navigated over an Aristotle 24 microguidewire into the cavernous segment of the right ICA. The aspiration catheter  was then advanced to the level of occlusion and connected to an aspiration pump. Continuous aspiration was performed for 2 minutes. The aspiration catheter was subsequently removed under constant aspiration. The guide catheter was aspirated for debris. Due to absent back flow in guide catheter, it was withdrawn under continuous aspiration. Under fluoroscopy, the Zoom 88 guide catheter was navigated over a 6 [place] Berenstein 2 catheter  and a 0.035" Terumo Glidewire into the aortic arch. The catheter was placed into the right common carotid artery and then advanced into the right internal carotid artery. The inner catheter was removed. Frontal and lateral angiograms of the head were obtained. Recanalization of the right M1/MCA segment seen with occlusion of the distal right M2/MCA anterior division branch. Under biplane roadmap, a Zoom 35 aspiration catheter was navigated over an Aristotle 14 microguidewire into the cavernous segment of the right ICA. The aspiration catheter was then advanced to the level of occlusion and connected to an aspiration pump. Continuous aspiration was performed for 2 minutes. The guide catheter was connected to a VacLok syringe. The aspiration catheter was subsequently removed under constant aspiration. The guide catheter was aspirated for debris. Follow-up angiogram showed persistent occlusion of the distal right M3/MCA segment. Under biplane roadmap, a Zoom 35 aspiration catheter was navigated over an Aristotle 14 microguidewire into the cavernous segment of the right ICA. The aspiration catheter was then advanced to the level of occlusion in the right M3 segment and connected to an aspiration pump. Continuous aspiration was performed for 2 minutes. The guide catheter was connected to a VacLok syringe. The aspiration catheter was subsequently removed under constant aspiration. The guide catheter was aspirated for debris. Right ICA angiogram showed complete reperfusion of the right MCA vascular tree (TICI3). Flat panel CT of the head was obtained and post processed in a separate workstation with concurrent attending physician supervision. Selected images were sent to PACS. Trace subarachnoid hemorrhage in the right sylvian fissure noted. A right common femoral artery angiogram was obtained in right anterior oblique view. The puncture is at the level of the common femoral artery which has adequate caliber for closure  device. The femoral sheath was then exchanged over the wire for a Perclose pro style which was then utilized for access closure. Immediate hemostasis was achieved. IMPRESSION: 1. Successful mechanical thrombectomy performed with direct contact aspiration for treatment of a proximal right M1/MCA occlusion with complete recanalization after 3 passes (TICI 3). 2. Trace subarachnoid hemorrhage in the right sylvian fissure on postprocedural flat panel head CT. PLAN: 1. Transfer to ICU. 2. Follow-up head CT in 3 hours to ensure stability of trace subarachnoid hemorrhage in right sylvian fissure. Electronically Signed   By: Pedro Earls M.D.   On: 03/15/2021 16:16   CT CEREBRAL PERFUSION W CONTRAST  Result Date: 03/12/2021 CLINICAL DATA:  Neuro deficit, acute, stroke suspected. EXAM: CT PERFUSION BRAIN TECHNIQUE: Multiphase CT imaging of the brain was performed following IV bolus contrast injection. Subsequent parametric perfusion maps were calculated using RAPID software. CONTRAST:  81m OMNIPAQUE IOHEXOL 350 MG/ML SOLN COMPARISON:  CTA head and neck of the same day. FINDINGS: CT Brain Perfusion Findings: CBF (<30%) Volume: 186mPerfusion (Tmax>6.0s) volume: 17554mismatch Volume: 164m50mPECTS on noncontrast CT Head: 9/10 at 3:55 p.m. today. Infarction Location:Right MCA territory. Core infarct is in the right lentiform nucleus. IMPRESSION: 1. Right MCA territory infarct with a core infarct of 11 mL and surrounding penumbra of 164 mL. 2. Core infarct is in the right  lentiform nucleus. The above was relayed via text pager to Dr. Su Monks on 03/12/2021 at 16:20 . Electronically Signed   By: San Morelle M.D.   On: 03/12/2021 16:26   DG Chest Portable 1 View  Result Date: 03/12/2021 CLINICAL DATA:  Status post intubation. EXAM: PORTABLE CHEST 1 VIEW COMPARISON:  Chest radiograph dated 08/27/2019. FINDINGS: An endotracheal tube terminates in the midthoracic trachea. The heart is enlarged.  There is a minimal atelectasis or interstitial opacity in the right mid lung and the left lung base. There is no pleural effusion or pneumothorax. Degenerative changes are seen in the spine. IMPRESSION: Endotracheal tube in the midthoracic trachea. Minimal atelectasis or interstitial opacities in the right mid lung and left lung base. Electronically Signed   By: Zerita Boers M.D.   On: 03/12/2021 16:24   DG Shoulder Left Port  Result Date: 03/16/2021 CLINICAL DATA:  Fall. EXAM: LEFT SHOULDER COMPARISON:  None. FINDINGS: There is no evidence of fracture or dislocation. There is no evidence of arthropathy or other focal bone abnormality. Soft tissues are unremarkable. IMPRESSION: Negative. Electronically Signed   By: Ronney Asters M.D.   On: 03/16/2021 16:11   DG Abd Portable 1V  Result Date: 03/13/2021 CLINICAL DATA:  Screening for metal prior to MRI. EXAM: PORTABLE ABDOMEN - 1 VIEW COMPARISON:  None. FINDINGS: There is a left hip replacement. No other metallic structures in the visualized lower abdomen or pelvis. Contrast material noted within the urinary bladder. No acute bony abnormality. IMPRESSION: Prior left hip replacement. Electronically Signed   By: Rolm Baptise M.D.   On: 03/13/2021 01:22   Overnight EEG with video  Result Date: 03/14/2021 Lora Havens, MD     03/14/2021 10:18 AM Patient Name: Patrick Padilla MRN: [id number] Epilepsy Attending: Lora Havens Referring Physician/Provider: Dr Su Monks Duration: 03/13/2021 0932 to 03/14/2021 0932 Patient history: 65 yo M with status epilepticus and M1 occlusion. EEG to evaluate for seizure Level of alertness: Awake, asleep AEDs during EEG study: LEV, propofol Technical aspects: This EEG study was done with scalp electrodes positioned according to the 10-20 International system of electrode placement. Electrical activity was acquired at a sampling rate of 500Hz  and reviewed with a high frequency filter of 70Hz  and a low frequency filter of 1Hz . EEG  data were recorded continuously and digitally stored. Description: No posterior dominant rhythm was seen. Sleep was characterized by vertex waves, sleep spindles (12 to 14 Hz), maximal frontocentral region.  EEG showed continuous generalized 3 to 6 Hz theta-delta slowing. Hyperventilation and photic stimulation were not performed.   ABNORMALITY - Continuous slow, generalized IMPRESSION: This study is suggestive of moderate diffuse encephalopathy, nonspecific etiology but likely related to sedation. No seizures or epileptiform discharges were seen throughout the recording. Lora Havens   ECHOCARDIOGRAM COMPLETE  Result Date: 03/14/2021    ECHOCARDIOGRAM REPORT   Patient Name:   HASNAIN MANHEIM Date of Exam: 03/14/2021 Medical Rec #:  [id number]   Height:       72.0 in Accession #:    [id number]  Weight:       198.4 lb Date of Birth:  [birth year]   BSA:          2.123 m Patient Age:    49 years    BP:           109/69 mmHg Patient Gender: M           HR:  85 bpm. Exam Location:  Inpatient Procedure: 2D Echo, Cardiac Doppler and Color Doppler Indications:    Stroke  History:        Patient has prior history of Echocardiogram examinations, most                 recent 02/05/2019. Arrythmias:Atrial Fibrillation; Risk                 Factors:Hypertension and Dyslipidemia. ETOH abuse.  Sonographer:    Clayton Lefort RDCS (AE) Referring Phys: [id number] Patrecia Pour STACK  Sonographer Comments: Echo performed with patient supine and on artificial respirator. IMPRESSIONS  1. Left ventricular ejection fraction, by estimation, is 60 to 65%. The left ventricle has normal function. The left ventricle has no regional wall motion abnormalities. There is mild left ventricular hypertrophy. Left ventricular diastolic parameters are indeterminate.  2. Right ventricular systolic function is normal. The right ventricular size is normal.  3. Right atrial size was mildly dilated.  4. The mitral valve is abnormal. Trivial mitral valve  regurgitation. No evidence of mitral stenosis.  5. The aortic valve was not well visualized. There is mild calcification of the aortic valve. Aortic valve regurgitation is not visualized. Mild aortic valve sclerosis is present, with no evidence of aortic valve stenosis.  6. The inferior vena cava is normal in size with greater than 50% respiratory variability, suggesting right atrial pressure of 3 mmHg. FINDINGS  Left Ventricle: Left ventricular ejection fraction, by estimation, is 60 to 65%. The left ventricle has normal function. The left ventricle has no regional wall motion abnormalities. The left ventricular internal cavity size was normal in size. There is  mild left ventricular hypertrophy. Left ventricular diastolic parameters are indeterminate. Right Ventricle: The right ventricular size is normal. No increase in right ventricular wall thickness. Right ventricular systolic function is normal. Left Atrium: Left atrial size was normal in size. Right Atrium: Right atrial size was mildly dilated. Pericardium: There is no evidence of pericardial effusion. Mitral Valve: The mitral valve is abnormal. There is mild thickening of the mitral valve leaflet(s). There is mild calcification of the mitral valve leaflet(s). Trivial mitral valve regurgitation. No evidence of mitral valve stenosis. MV peak gradient, 5.6 mmHg. The mean mitral valve gradient is 2.0 mmHg. Tricuspid Valve: The tricuspid valve is normal in structure. Tricuspid valve regurgitation is trivial. No evidence of tricuspid stenosis. Aortic Valve: The aortic valve was not well visualized. There is mild calcification of the aortic valve. Aortic valve regurgitation is not visualized. Mild aortic valve sclerosis is present, with no evidence of aortic valve stenosis. Aortic valve mean gradient measures 2.0 mmHg. Aortic valve peak gradient measures 4.8 mmHg. Aortic valve area, by VTI measures 3.65 cm. Pulmonic Valve: The pulmonic valve was normal in  structure. Pulmonic valve regurgitation is not visualized. No evidence of pulmonic stenosis. Aorta: The aortic root is normal in size and structure. Venous: The inferior vena cava is normal in size with greater than 50% respiratory variability, suggesting right atrial pressure of 3 mmHg. IAS/Shunts: No atrial level shunt detected by color flow Doppler.  LEFT VENTRICLE PLAX 2D LVIDd:         4.40 cm LVIDs:         3.30 cm LV PW:         1.60 cm LV IVS:        1.30 cm LVOT diam:     2.30 cm LV SV:         71 LV SV  Index:   34 LVOT Area:     4.15 cm  RIGHT VENTRICLE             IVC RV Basal diam:  3.30 cm     IVC diam: 1.80 cm RV S prime:     13.80 cm/s TAPSE (M-mode): 1.3 cm LEFT ATRIUM           Index       RIGHT ATRIUM           Index LA diam:      3.50 cm 1.65 cm/m  RA Area:     23.80 cm LA Vol (A2C): 52.0 ml 24.49 ml/m RA Volume:   68.10 ml  32.07 ml/m LA Vol (A4C): 58.8 ml 27.69 ml/m  AORTIC VALVE AV Area (Vmax):    3.35 cm AV Area (Vmean):   3.33 cm AV Area (VTI):     3.65 cm AV Vmax:           109.00 cm/s AV Vmean:          71.900 cm/s AV VTI:            0.196 m AV Peak Grad:      4.8 mmHg AV Mean Grad:      2.0 mmHg LVOT Vmax:         88.00 cm/s LVOT Vmean:        57.600 cm/s LVOT VTI:          0.172 m LVOT/AV VTI ratio: 0.88  AORTA Ao Root diam: 3.60 cm Ao Asc diam:  2.90 cm MITRAL VALVE MV Area VTI:  3.01 cm  SHUNTS MV Peak grad: 5.6 mmHg  Systemic VTI:  0.17 m MV Mean grad: 2.0 mmHg  Systemic Diam: 2.30 cm MV Vmax:      1.18 m/s MV Vmean:     69.2 cm/s Jenkins Rouge MD Electronically signed by Jenkins Rouge MD Signature Date/Time: 03/14/2021/9:49:39 AM    Final    IR PERCUTANEOUS ART THROMBECTOMY/INFUSION INTRACRANIAL INC DIAG ANGIO  Result Date: 03/15/2021 INDICATION: 65 year old male with history significant for EtOH abuse and atrial fibrillation presenting with left hemiplegia, right sided jerking after attempted cardioversion by EMS for afib with RVR. His last known well was 10 p.m. on  March 11, 2021. NIHSS at presentation 26; premorbid Rankin scale 2. Head CT showed a right basal ganglia infarct (ASPECTS 9). CT angiogram of the head and neck showed an occlusion of the right M1/MCA segment with poor collaterals. He was transferred to our service for a diagnostic cerebral angiogram and mechanical thrombectomy. EXAM: GUIDED VASCULAR ACCESS DIAGNOSTIC CEREBRAL ANGIOGRAM MECHANICAL THROMBECTOMY FLAT PANEL HEAD CT COMPARISON:  CT angiogram of the head and neck March 12, 2021 MEDICATIONS: No antibiotic administered. ANESTHESIA/SEDATION: The procedure was performed under general anesthesia. CONTRAST:  65 mL of Omnipaque 240 milligram/mL FLUOROSCOPY TIME:  Fluoroscopy Time: 21 minutes 48 seconds (841 mGy). COMPLICATIONS: None immediate. TECHNIQUE: Informed written consent was not obtained. Multiple attempts to reach next of kin were unsuccessful. Discussion held with Dr. Quinn Axe (neurology) and decision made to proceed with mechanical thrombectomy given imminent risk of significant morbidity and mortality in the absence of emergency endovascular treatment of right MCA occlusion. Maximal Sterile Barrier Technique was utilized including caps, mask, sterile gowns, sterile gloves, sterile drape, hand hygiene and skin antiseptic. A timeout was performed prior to the initiation of the procedure. The right groin was prepped and draped in the usual sterile fashion. Using a micropuncture kit and the modified  Seldinger technique, access was gained to the right common femoral artery and an 8 French sheath was placed. Real-time ultrasound guidance was utilized for vascular access including the acquisition of a permanent ultrasound image documenting patency of the accessed vessel. Under fluoroscopy, a Zoom 88 guide catheter was navigated over a 6 [place] Berenstein 2 catheter and a 0.035" Terumo Glidewire into the aortic arch. The catheter was placed into the right common carotid artery and then advanced into the  right internal carotid artery. The inner catheter was removed. Frontal and lateral angiograms of the head were obtained. FINDINGS: 1. The femoral artery is patent, with adequate caliber for vascular access. 2. Occlusion of the right M1/MCA just distal to the origin of the anterior temporal branch. PROCEDURE: Under biplane roadmap, a zoom 71 aspiration catheter was navigated over an Aristotle 24 microguidewire into the cavernous segment of the right ICA. The aspiration catheter was then advanced to the level of occlusion and connected to an aspiration pump. Continuous aspiration was performed for 2 minutes. The aspiration catheter was subsequently removed under constant aspiration. The guide catheter was aspirated for debris. Due to absent back flow in guide catheter, it was withdrawn under continuous aspiration. Under fluoroscopy, the Zoom 88 guide catheter was navigated over a 6 [place] Berenstein 2 catheter and a 0.035" Terumo Glidewire into the aortic arch. The catheter was placed into the right common carotid artery and then advanced into the right internal carotid artery. The inner catheter was removed. Frontal and lateral angiograms of the head were obtained. Recanalization of the right M1/MCA segment seen with occlusion of the distal right M2/MCA anterior division branch. Under biplane roadmap, a Zoom 35 aspiration catheter was navigated over an Aristotle 14 microguidewire into the cavernous segment of the right ICA. The aspiration catheter was then advanced to the level of occlusion and connected to an aspiration pump. Continuous aspiration was performed for 2 minutes. The guide catheter was connected to a VacLok syringe. The aspiration catheter was subsequently removed under constant aspiration. The guide catheter was aspirated for debris. Follow-up angiogram showed persistent occlusion of the distal right M3/MCA segment. Under biplane roadmap, a Zoom 35 aspiration catheter was navigated over an Aristotle 14  microguidewire into the cavernous segment of the right ICA. The aspiration catheter was then advanced to the level of occlusion in the right M3 segment and connected to an aspiration pump. Continuous aspiration was performed for 2 minutes. The guide catheter was connected to a VacLok syringe. The aspiration catheter was subsequently removed under constant aspiration. The guide catheter was aspirated for debris. Right ICA angiogram showed complete reperfusion of the right MCA vascular tree (TICI3). Flat panel CT of the head was obtained and post processed in a separate workstation with concurrent attending physician supervision. Selected images were sent to PACS. Trace subarachnoid hemorrhage in the right sylvian fissure noted. A right common femoral artery angiogram was obtained in right anterior oblique view. The puncture is at the level of the common femoral artery which has adequate caliber for closure device. The femoral sheath was then exchanged over the wire for a Perclose pro style which was then utilized for access closure. Immediate hemostasis was achieved. IMPRESSION: 1. Successful mechanical thrombectomy performed with direct contact aspiration for treatment of a proximal right M1/MCA occlusion with complete recanalization after 3 passes (TICI 3). 2. Trace subarachnoid hemorrhage in the right sylvian fissure on postprocedural flat panel head CT. PLAN: 1. Transfer to ICU. 2. Follow-up head CT in 3 hours to  ensure stability of trace subarachnoid hemorrhage in right sylvian fissure. Electronically Signed   By: Pedro Earls M.D.   On: 03/15/2021 16:16       HISTORY OF PRESENT ILLNESS   66 yo patient with hx EtOH abuse and a fib (unclear if on [facility]) who presented to ED with L hemiplegia and R sided jerking after attempted cardioversion by EMS for a fib with RVR. Patient inubated on my examination and unable to provide history. Family is unreachable. Hx is second-hand from EMS via [place]. Per  this report, patient last seen well when he went to bed at 10pm last night. Woke up this morning confused. Earlier this afternoon developed diaphoresis and chest discomfort. Called EMS who stated patient was in a fib with RVR. Attempted cardioversion en route. On arrival to ED per EDP patient was hemiplegic on the left with rhythmic jerking RUE. He was intubated with rocuronium for acute hypoxic respiratory failure. EDP contacted neurology and I activated a stroke code while he was in the CT scan. He remained paralyzed and so I could not perform a neurologic exam or NIHSS. Outside window for TNK. CTH possible subtle loss of gray-white differentiation in R lentiform nucleus and bilateral thalami ASPECTS 9/10. CTA showed R M1 occlusion. CTP showed R MCA infarct core 68m with penumbra of 168m    At that point we were still unable to contact family therefore after discussion with neurointerventionalist I activated code IR under emergency protocol. I attempted contacting wife at all numbers listed in chart multiple times and was unable to reach. ED received a message to call [phone] update son DaShanon Browut when I called the number he was not home and his fiance stated she had no way to get in touch with him until he got home from the store.    Patient taken to IR under emergency protocol for emergent thrombectomy for R M1 occlusion. mechanical thrombectomy performed with direct contact aspiration, total 3 passes with complete recanalization (TICI3). No embolus to new territory. Trace SAH in right Sylvian fissure seen on post procedure flat panel head CT.  EEG done during hospitalization showed moderate diffuse encephalopathy likely related to sedation, but no seizure or seizure predisposition.     HOSPITAL COURSE Patient admitted into hospital and underwent thrombectomy for right M1 occlusion. Hospital course unremarkable. EEG showed no seizure activity. He complained of right shoulder pain because of a  fall prior to admission, but xray of left shoulder was unremarkable. He was started on heparin drip and that was discontinued on day of discharge with switch to eliquis at usual dose. He will be discharged to CIR today and will follow up with our team at stroke clinic.   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  CT Possible subtle loss of gray-white differentiation in R lentiform nucleus and bilateral thalami  CTA head & neck Emergent LVO of the right M1 segment with poor collaterals. CT perfusion: R MCA infarct core 1134mith penumbra of 165m75mR 9/3 Proximal right M1/MCA occlusion s/p mechanical thrombectomy with TICI3) MRI Confluent acute infarct at the right basal ganglia. Few and punctate bifrontal peripheral infarcts. Recent medial left thalamic infarct which may be subacute. PRES appearance in the bilateral parietooccipital cortex. Small volume blood products at the right sylvian fissure without evidence of progression from prior CT. 2D Echo EF 60-65%  EEG spot: no seizure or  seizure predisposition recorded on this study.  LTM EEG This study is suggestive of moderate diffuse encephalopathy LDL 97 HgbA1c 5.2 VTE prophylaxis - heparin subq Unclear if taking Eliquis prior to admission, switched patient from heprain IV to eliquis prior to dicharge  Therapy recommendations: CIR Disposition:  CIR today   Atrial fibrillation with RVR S/p attempted cardioversion by EMS Initially in SR until afternoon of 9/5 when noted to be in afib  Unclear compliance with home Eliquis: pharmacy tech noted that the son does not know when the last eliquis was taken. There was a bottle in the bag of medications brought in by son with only one pill left but the label was smeared so the fill date couldn't be seen. The last time the med was filled per the [facility] system was December 2021.  Rate controlled now Heparin IV switch to  eliquis today   PRES Severe Hypertension B/p 220s/120s on presentation Home meds:  Micardis, Lopressor  Restarted home meds   Stable now Long-term BP goal normotensive   Right arm shaking ? Rhythmic jerking RUE on admission More consistent with postural tremor vs. clonus EEG spot - no seizure LTM EEG- no seizure or epileptiform discharges seen On Keppra initially, discontinued 9/6.    Hyperlipidemia Home meds:  None LDL 97, goal < 70 On Lipitor 68m  Continue statin at discharge   Acute respiratory failure  S/p intubation Extubated mid morning 9/5 CCM managing and appreciated    Leukocytosis WBC 20.6->17.8->16.7->10.9->10.1->11.0  Afebrile, monitor fever curve  Blood cultures showing #1 NG at 2 days and the #2 showing staph epidermis which is felt likely to be contaminant Abd xray no acute findings CXR: Minimal atelectasis or interstitial opacities in the right mid lung and left lung base.   Feeding/swallow status Passed for dysphagia diet SLIV and monitor intake    AKI Oliguria Cr 1.7->1.25->1.37->0.98->0.81->0.76 SLIV No apparent urinary retention on bladder scans Monitor   History of ETOH abuse Son reports he drinks whiskey nightly Alcohol level less than 10 on admission CIWA in place  Monitor BMP/Mag Thiamine/folic acid replacement on board   Tobacco abuse Current heavy smoker Smoking cessation counseling  provided   Other Stroke Risk Factors Advanced Age >/= 690 High risk for Obstructive sleep apnea   Right shoulder pain Reports fall at home prior to admission Check right shoulder xray - negative for fracture or dislocation.   Other Active Problems Hypokalemia, Potassium 3.2->3.6->3.5  DISCHARGE EXAM Blood pressure (!) 151/99, pulse 88, temperature 99 F (37.2 C), temperature source Oral, resp. rate 20, height 6' (1.829 m), weight 90 kg, SpO2 95 %.   Resp: Even and unlabored Cardiovascular - Regular rhythm and rate. No afib at present on  tele.  Abdominal-large round soft, no tenderness on exam  Skin: warm and dry.  Ext: No edema Mental Status -  Awake, alert oriented to person, place, not date or time.    Cranial Nerves II - XII   PERRL. EOMI Face symmetric TML Sensation intact V1-V3 VFF Left facial droop    Motor Strength -  Moving all 4 extremities purposefully against gravity. LUE with +drift, proximal weakness 3/5, mild tenderness to palpation of shoulder region, no erythema, warmth or edema. Grip strength on the left 4/5. LLE weakness proximally with 3/5 at HF, otherwise 4+/g Sensory -Intact x 4 extremities to light touch   Coordination - unable to assess due to asses to AMS    Discharge Diet      Diet  DIET DYS 3 Room service appropriate? Yes; Fluid consistency: Thin   liquids  DISCHARGE PLAN Disposition:  Transfer to [facility] for ongoing PT, OT and ST Eliquis (apixaban) daily for secondary stroke prevention  Recommend ongoing stroke risk factor control by Primary Care Physician at time of discharge from inpatient rehabilitation. Follow-up PCP in 2 weeks following discharge from rehab. Follow-up in [facility] in 4 weeks following discharge from rehab, office to schedule an appointment.    35 minutes were spent preparing discharge.   Rosalin Hawking, MD PhD Stroke Neurology 03/17/2021 3:11 PM

## 2021-03-17 NOTE — TOC Transition Note (Signed)
Transition of Care [facility]) - CM/SW Discharge Note   Patient Details  Name: Patrick Padilla MRN: [id number] Date of Birth: [birth year]  [facility] CM/SW Contact:  Kermit Balo, RN Phone Number: 03/17/2021, 11:56 AM   Clinical Narrative:    Pt discharging to CIR today. CM signing off.    Final next level of care: IP Rehab Facility Barriers to Discharge: No Barriers Identified   Patient Goals and CMS Choice        Discharge Placement                       Discharge Plan and Services                                     Social Determinants of Health (SDOH) Interventions     Readmission Risk Interventions No flowsheet data found.

## 2021-03-17 NOTE — Progress Notes (Addendum)
STROKE TEAM PROGRESS NOTE   INTERVAL HISTORY: No acute events overnight. However, he was not sleeping at night and received ativan overnight. On my encounter, he was initially sleeping but woke up on voice. No neuro changes. Plan for CIR admission today.   Vitals:   03/17/21 0150 03/17/21 0359 03/17/21 0716 03/17/21 1159  BP: (!) 164/69 (!) 159/96 (!) 161/98 (!) 151/99  Pulse: 80 91 88 88  Resp:  18 18 20   Temp:  99 F (37.2 C) 98.8 F (37.1 C) 99 F (37.2 C)  TempSrc:  Oral Oral Oral  SpO2:  98% 97% 95%  Weight:      Height:       CBC:  Recent Labs  Lab 03/12/21 1540 03/12/21 1600 03/16/21 0246 03/17/21 0018  WBC 20.6*   < > 10.1 11.0*  NEUTROABS 18.2*  --   --   --   HGB 18.6*   < > 15.2 15.2  HCT 53.2*   < > 45.0 45.0  MCV 100.0   < > 101.6* 100.4*  PLT 217   < > 181 216   < > = values in this interval not displayed.    Basic Metabolic Panel:  Recent Labs  Lab 03/16/21 0246 03/17/21 0018  NA 137 138  K 3.6 3.5  CL 104 105  CO2 22 25  GLUCOSE 93 99  BUN 11 9  CREATININE 0.81 0.76  CALCIUM 8.6* 8.7*  MG 1.8 1.9    Lipid Panel:  Recent Labs  Lab 03/13/21 0241  CHOL 162  TRIG 92  92  HDL 47  CHOLHDL 3.4  VLDL 18  LDLCALC 97    HgbA1c:  Recent Labs  Lab 03/13/21 0241  HGBA1C 5.4    Urine Drug Screen: No results for input(s): LABOPIA, COCAINSCRNUR, LABBENZ, AMPHETMU, THCU, LABBARB in the last 168 hours.  Alcohol Level  Recent Labs  Lab 03/12/21 1540  ETH <10    IMAGING past 24 hours DG Shoulder Left Port  Result Date: 03/16/2021 CLINICAL DATA:  Fall. EXAM: LEFT SHOULDER COMPARISON:  None. FINDINGS: There is no evidence of fracture or dislocation. There is no evidence of arthropathy or other focal bone abnormality. Soft tissues are unremarkable. IMPRESSION: Negative. Electronically Signed   By: 08-05-1970 M.D.   On: 03/16/2021 16:11    PHYSICAL EXAM:  Temp:  [98 F (36.7 C)-99.1 F (37.3 C)] 99 F (37.2 C) (09/08 1159) Pulse  Rate:  [80-91] 88 (09/08 1159) Resp:  [18-20] 20 (09/08 1159) BP: (143-164)/(69-99) 151/99 (09/08 1159) SpO2:  [95 %-98 %] 95 % (09/08 1159)  General - Elderly male lying in bed in NAD Resp: Even and unlabored Cardiovascular - Regular rhythm and rate. No afib at present on tele.  Abdominal-large round soft, no tenderness on exam  Skin: warm and dry.  Ext: No edema Mental Status -  Awake, alert oriented to person, place, not date or time.   Cranial Nerves II - XII   PERRL. EOMI Face symmetric TML Sensation intact V1-V3 VFF Minimal Left facial droop   Motor Strength -  Moving all 4 extremities purposefully against gravity. LUE with +drift, proximal weakness 3/5, mild tenderness to palpation of shoulder region, no erythema, warmth or edema. Grip strength on the left 4/5. LLE weakness proximally with 3/5 at HF, otherwise 4+/g Sensory -Intact x 4 extremities to light touch  Coordination - unable to assess due to asses to AMS  ASSESSMENT/PLAN: 65 yo patient with hx EtOH abuse and  a fib (unclear if on [facility]) who presented to ED with L hemiplegia and R sided jerking after attempted cardioversion by EMS for a fib with RVR.  Per report, patient last seen well when he went to bed at 10pm 9/2. Woke up 9/3 morning confused. Developed diaphoresis and chest discomfort in the afternoon. Called EMS who stated patient was in a fib with RVR. Attempted cardioversion en route. On arrival to ED per ED patient was hemiplegic on the left with rhythmic jerking RUE. He was intubated with rocuronium for acute hypoxic respiratory failure. EDP contacted neurology. Code Stroke activated while he was in the CT scan. He remained paralyzed inhibiting performance of a neurologic exam or NIHSS. Outside window for TNK. CTH showed possible subtle loss of gray-white differentiation in R lentiform nucleus and bilateral thalami ASPECTS 9/10. CTA showed  emergent R M1 occlusion. CTP showed R MCA infarct core 78ml with penumbra of  . Family was unreachable despite multiple attempts. 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  CT Possible subtle loss of gray-white differentiation in R lentiform nucleus and bilateral thalami  CTA head & neck Emergent LVO of the right M1 segment with poor collaterals. CT perfusion: R MCA infarct core 59ml with penumbra of . IR 9/3 Proximal right M1/MCA occlusion s/p mechanical thrombectomy with TICI3) MRI Confluent acute infarct at the right basal ganglia. Few and punctate bifrontal peripheral infarcts. Recent medial left thalamic infarct which may be subacute. PRES appearance in the bilateral parietooccipital cortex. Small volume blood products at the right sylvian fissure without evidence of progression from prior CT. 2D Echo EF 60-65%  EEG spot: no seizure or seizure predisposition recorded on this study.  LTM EEG This study is suggestive of moderate diffuse encephalopathy LDL 97 HgbA1c 5.2 VTE prophylaxis - heparin subq Unclear if taking Eliquis prior to admission, switched patient from heprain IV to eliquis today prior to dicharge  Therapy recommendations: CIR Disposition:  CIR today  Atrial fibrillation S/p attempted cardioversion by EMS Initially in SR until afternoon of 9/5 when noted to be in afib  Unclear compliance with home Eliquis: pharmacy tech noted that the son does not know when the last eliquis was taken. There was a bottle in the bag of medications brought in by son with only one pill left but the label was smeared so the fill date couldn't be seen. The last time the med was filled per the [facility] system was December 2021.  Rate controlled now  PRES Severe Hypertension B/p 220s/120s on presentation Home meds:  Micardis, Lopressor  Restarted home meds   Stable now Long-term BP goal  normotensive  Right arm shaking ? Rhythmic jerking RUE on admission More consistent with postural tremor vs. clonus EEG spot - no seizure LTM EEG- no seizure or epileptiform discharges seen On Keppra initially, discontinued 9/6.   Hyperlipidemia Home meds:  None LDL 97, goal < 70 On Lipitor 40mg   Continue statin at discharge  Acute respiratory failure  S/p intubation Extubated mid morning 9/5 CCM managing and appreciated   Leukocytosis WBC 20.6->17.8->16.7->10.9->10.1->11.0  Afebrile, monitor fever curve  Blood cultures showing #1 NG at 2 days and the #2 showing staph epidermis which is felt likely to be contaminant Abd xray no acute findings CXR: Minimal atelectasis or interstitial opacities in the right mid lung and left  lung base.  Feeding/swallow status Passed for dysphagia diet SLIV and monitor intake   AKI Oliguria Cr 1.7->1.25->1.37->0.98->0.81->0.76 SLIV No apparent urinary retention on bladder scans Monitor  History of ETOH abuse Son reports he drinks whiskey nightly Alcohol level less than 10 on admission CIWA in place  Monitor BMP/Mag Thiamine/folic acid replacement on board  Tobacco abuse Current heavy smoker Smoking cessation counseling  provided  Other Stroke Risk Factors Advanced Age >/= 44  High risk for Obstructive sleep apnea  Right shoulder pain Reports fall at home prior to admission Check right shoulder xray - negative for fracture or dislocation.  Other Active Problems Hypokalemia, Potassium 3.2->3.6->3.5   Hospital day # 5  Marvel Plan, MD PhD Stroke Neurology 03/17/2021 3:03 PM   To contact Stroke Continuity provider, please refer to [email or web address]. After hours, contact General Neurology

## 2021-03-17 NOTE — Progress Notes (Addendum)
Inpatient Rehab Admissions Coordinator:   Met with patient at bedside.  He remains confused; thinks his wife is at bedside but no visitors are present.  Attempted to speak to his son Patrick Padilla, per his request, but no answer.  Left another voicemail and I will try Ronalee Belts (his other son) today.    1135: I was able to speak to Patrick Padilla and confirm that he and his significant other can provide some assist at discharge (not more than supervision).  We discussed Medicare A/B benefits as well.  Dr. Erlinda Hong in agreement for pt to admit today. I will let pt and TOC team know.   Shann Medal, PT, DPT Admissions Coordinator [phone] 03/17/21  11:10 AM

## 2021-03-17 NOTE — Progress Notes (Signed)
PMR Admission Coordinator Pre-Admission Assessment   Patient: Patrick Padilla is an 65 y.o., male MRN: [id number] DOB: [birth year] Height: 6' (182.9 cm) Weight: 90 kg   Insurance Information HMO:     PPO:      PCP:      IPA:      80/20:      OTHER:  PRIMARY: Medicare A/B      Policy#: [id number]      Subscriber: pt CM Name:       Phone#:      Fax#:  Pre-Cert#:       Employer:  Benefits:  Phone #:      Name:  Eff. Date: A 02/07/13, B 09/08/18     Deduct: $1556      Out of Pocket Max: n/a      Life Max: n/a CIR: 100%      SNF: 20 full days Outpatient: 80%     Co-Ins: 20% Home Health: 100%      Co-Pay:  DME: 80%     Co-Ins: 20% Providers: n/a SECONDARY:       Policy#:      Phone#:    Development worker, community:       Phone#:    The Therapist, art Information Summary" for patients in Inpatient Rehabilitation Facilities with attached "Privacy Act [place] Records" was provided and verbally reviewed with: Family   Emergency Contact Information Contact Information       Name Relation Home Work Mobile    [place]       [phone]    Nelida Gores     [phone]           Current Medical History  Patient Admitting Diagnosis: CVA   History of Present Illness: Pt is a 65 y/o male with history of afib and ETOH abuse who was admitted to [facility] on 9/3 for L hemiplegia and R sided jerking after attempted cardioversion by EMS for afib with RVR.  On arrival to ED pt was hemiplegic on the L with rhythmic jerking on RUE.  Intubated for respiratory failure with paralytics on board.  CTH showed possible subtle loss of gray-white differentiation in R lentiform nucleus and bilateral thalami.  CTA showed R MI occlusion.  CTP showed R MCA infarct core 11 mL with penumbra of 164 mL.  Pt underwent emergency thrombectomy of R M1 occlusion.  MRI showed confluent acute infarct at the right basal ganglia, bilateral frontal peripheral infarcts, subacute L thalamic infarct, and PRES appearance in the  bilateral parieto occipital cortex.  Pt now on Eliquis starting 9/8.  Therapy evaluations were completed and pt was recommended for CIR for functional deficits and cognitive impairments.    Complete NIHSS TOTAL: 5   [facility]'s medical record from [facility] has been reviewed by the rehabilitation admission coordinator and physician.   Past Medical History      Past Medical History:  Diagnosis Date   Anxiety     Elevated LFTs     History of colon polyps     HLD (hyperlipidemia)     HTN (hypertension)     Localized superficial swelling, mass, or lump        Has the patient had major surgery during 100 days prior to admission? No   Family History   family history includes Clotting disorder in his mother; Colon cancer in his father; Heart disease in his brother; Hepatitis in his mother; Rheumatic fever in his brother; Stroke in his mother; Thyroid disease in  his mother.   Current Medications   Current Facility-Administered Medications:    0.9 %  sodium chloride infusion, , Intravenous, Continuous, Bailey-Modzik, Delila A, NP, Stopped at 03/15/21 1417   acetaminophen (TYLENOL) tablet 650 mg, 650 mg, Per Tube, Q4H PRN, 650 mg at 03/17/21 0844 **OR** acetaminophen (TYLENOL) 160 MG/5ML solution 650 mg, 650 mg, Per Tube, Q4H PRN **OR** [DISCONTINUED] acetaminophen (TYLENOL) suppository 650 mg, 650 mg, Rectal, Q4H PRN, Derek Jack, MD   atorvastatin (LIPITOR) tablet 40 mg, 40 mg, Oral, Daily, Bailey-Modzik, Delila A, NP, 40 mg at 03/17/21 0844   chlorhexidine gluconate (MEDLINE KIT) (PERIDEX) 0.12 % solution 15 mL, 15 mL, Mouth Rinse, BID, Bailey-Modzik, Delila A, NP, 15 mL at 32/95/18 8416   folic acid (FOLVITE) tablet 1 mg, 1 mg, Oral, Daily, Bailey-Modzik, Delila A, NP, 1 mg at 03/17/21 0844   heparin ADULT infusion 100 units/mL (25000 units/238m), 2,000 Units/hr, Intravenous, Continuous, Dang, Thuy D, RPH, Last Rate: 18.5 mL/hr at 03/17/21 1049, 1,850 Units/hr at 03/17/21 1049    irbesartan (AVAPRO) tablet 75 mg, 75 mg, Oral, Daily, Bailey-Modzik, Delila A, NP, 75 mg at 03/17/21 0844   LORazepam (ATIVAN) tablet 1-4 mg, 1-4 mg, Per Tube, Q1H PRN **OR** LORazepam (ATIVAN) injection 1-4 mg, 1-4 mg, Intravenous, Q1H PRN, Bailey-Modzik, Delila A, NP, 2 mg at 03/17/21 0150   metoprolol tartrate (LOPRESSOR) tablet 100 mg, 100 mg, Oral, BID, Bailey-Modzik, Delila A, NP, 100 mg at 03/17/21 0844   multivitamin with minerals tablet 1 tablet, 1 tablet, Oral, Daily, Bailey-Modzik, Delila A, NP, 1 tablet at 03/17/21 0844   senna-docusate (Senokot-S) tablet 1 tablet, 1 tablet, Per Tube, QHS PRN, Bailey-Modzik, Delila A, NP   thiamine tablet 100 mg, 100 mg, Oral, Daily, Bailey-Modzik, Delila A, NP, 100 mg at 03/17/21 0844   Patients Current Diet:  Diet Order                  DIET DYS 3 Room service appropriate? Yes; Fluid consistency: Thin  Diet effective now                         Precautions / Restrictions Precautions Precautions: Fall Precaution Comments: on continuous EEG Restrictions Weight Bearing Restrictions: No    Has the patient had 2 or more falls or a fall with injury in the past year? 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 Independent   Indoor Mobility: Did the patient need assistance with walking from room to room (with or without device)? Independent   Stairs: Did the patient need assistance with internal or external stairs (with or without device)? Independent   Functional Cognition: Did the patient need help planning regular tasks such as shopping or remembering to take medications? Independent   Patient Information Are you of Hispanic, Latino/a,or Spanish origin?:  A. No, not of Hispanic, Latino/a, or Spanish origin What is your race?: A. White Do you need or want an interpreter to communicate with a doctor or health care staff?: 0. No   Patient's Response To:  Health Literacy and Transportation Is the patient able to respond to health literacy and transportation needs?: No   Home Assistive Devices /  Equipment Home Equipment: None   Prior Device Use: Indicate devices/aids used by the patient prior to current illness, exacerbation or injury? None of the above   Current Functional Level Cognition   Arousal/Alertness: Awake/alert Overall Cognitive Status: Impaired/Different from baseline Current Attention Level: Focused Orientation Level: Oriented to person, Oriented to place, Oriented to situation, Disoriented to time Following Commands: Follows one step commands with increased time, Follows one step commands inconsistently Safety/Judgement: Decreased awareness of safety, Decreased awareness of deficits (impulsive) General Comments: pt needs 1-step cues for safety with each mobility task; pt impulsive to get out of chair but decreased short term memory from discussion with PTA 90 mins earlier that he needs to press call bell. Pt received in chair trying to remove Posey lap alarm. Attention: Focused, Sustained Focused Attention: Impaired Focused Attention Impairment: Functional basic Sustained Attention: Impaired Sustained Attention Impairment: Functional basic Memory: Impaired Memory Impairment: Storage deficit Awareness: Impaired Awareness Impairment: Intellectual impairment, Emergent impairment, Anticipatory impairment Problem Solving: Impaired Problem Solving Impairment: Functional basic Executive Function: Self Monitoring, Self Correcting Self Monitoring: Impaired Self Monitoring Impairment: Functional basic Behaviors: Restless Safety/Judgment: Impaired    Extremity Assessment (includes Sensation/Coordination)   Upper Extremity  Assessment: LUE deficits/detail RUE Deficits / Details: pt using R UE purposefully to reach for vent tube LUE Deficits / Details: LUE generalized weakness; moving out of synergy pattern; AAROM to @ 100 FF; unablet o hold @ 90; gross grasp/release; not attempting to use functionally LUE Sensation: decreased light touch LUE Coordination: decreased fine motor, decreased gross motor  Lower Extremity Assessment: Defer to PT evaluation RLE Deficits / Details: pt attempted to kick and wiggle toes to command LLE Deficits / Details: no active movement, appearead to have increased tone when attempting to perform PROM     ADLs   Overall ADL's : Needs assistance/impaired Eating/Feeding: NPO Eating/Feeding Details (indicate cue type and reason): ice chips/water; awaiting swallow eval Grooming: Moderate assistance Upper Body Bathing: Moderate assistance Lower Body Bathing: Maximal assistance, Bed level Upper Body Dressing : Moderate assistance Upper Body Dressing Details (indicate cue type and reason): pt attempting to help however missing armholes Lower Body Dressing: Total assistance Toileting- Clothing Manipulation and Hygiene: Total assistance Toileting - Clothing Manipulation Details (indicate cue type and reason): incontinenet of BM; Condom cath fell/pulled off Functional mobility during ADLs:  (will need +2)     Mobility   Overal bed mobility: Needs Assistance Bed Mobility: Rolling, Sit to Sidelying Rolling: Min assist Supine to sit: Mod assist, +2 for physical assistance Sit to sidelying: Min assist, +2 for physical assistance General bed mobility comments: increased time to process 1-step cues for reverse log roll and needs minA to get each LE over EOB; flat HOB, minA to rotate hips to return supine; pt able to pull himself up toward [facility] with hand over hand cues and minA.     Transfers   Overall transfer level: Needs assistance Equipment used: 2 person hand held assist Transfers: Sit  to/from Stand, Stand Pivot Transfers Sit to Stand: Mod assist, +2 physical assistance Stand pivot transfers: +2 physical assistance, Mod assist General transfer comment: tactile cues to power up, modA x2 to steady, pt with posterior lean, pt with increased lean in afternoon and more difficulty picking feet up, needs +2 modA for return transfer to EOB; also performed seated scooting toward HOB with mod cues and +2 min/modA via bed pad     Ambulation / Gait / Stairs / Wheelchair Mobility   Ambulation/Gait Ambulation/Gait assistance: Mod  assist, +2 physical assistance Gait Distance (Feet): 5 Feet (pre-gait marching x10 reps, seated break, then 43f from chair>bed.) Assistive device: 2 person hand held assist Gait Pattern/deviations: Step-to pattern, Leaning posteriorly, Shuffle, Decreased step length - right, Decreased step length - left General Gait Details: downward gaze, pt able to perform forward/retro steps and standing hip flexion. Pt took seated break then lateral steps toward bed (bed moved further away prior to transfer to see if he could progress distance), pt needed to sit after ~513f Increased time to perform; may be able to trial RW next session to see if this helps posture Gait velocity: grossly <0.4 m/s     Posture / Balance Dynamic Sitting Balance Sitting balance - Comments: R bias in supported sitting Balance Overall balance assessment: Needs assistance Sitting-balance support: Feet supported, Bilateral upper extremity supported Sitting balance-Leahy Scale: Poor Sitting balance - Comments: R bias in supported sitting Standing balance support: Bilateral upper extremity supported, During functional activity Standing balance-Leahy Scale: Poor Standing balance comment: dependent on physical assist +1-2     Special needs/care consideration N/a    Previous Home Environment (from acute therapy documentation) Living Arrangements: Children (son, dtr-in-law and grandchild) Available  Help at Discharge: Family, Available PRN/intermittently Type of Home: House Home Layout: One level Home Access: Stairs to enter Entrance Stairs-Rails: Left Entrance Stairs-Number of Steps: 1 Bathroom Shower/Tub: TuChiropodistStandard Additional Comments: need to confirm; pt unreliable   Discharge Living Setting Plans for Discharge Living Setting: Lives with (comment) (son, his gf, and 3 y/o grandchild) Type of Home at Discharge: House Discharge Home Layout: One level Discharge Home Access: Stairs to enter Entrance Stairs-Rails: Left Entrance Stairs-Number of Steps: 1 Discharge Bathroom Shower/Tub: Tub/shower unit Discharge Bathroom Toilet: Standard Discharge Bathroom Accessibility: No Does the patient have any problems obtaining your medications?: No   Social/Family/Support Systems Anticipated Caregiver: son, DaShanon Browand his gf Anticipated Caregiver's Contact Information: DaShanon [phone]/Limitations of Caregiver: works days, but gf can provide supervision Caregiver Availability: 24/7 Discharge Plan Discussed with Primary Caregiver: Yes Is Caregiver In Agreement with Plan?: Yes Does Caregiver/Family have Issues with Lodging/Transportation while Pt is in Rehab?: No   Goals  Supervision to min assist with PT and OT, Supervision with SLP ELOS 18-22 days   Decrease burden of Care through IP rehab admission: n/a   Possible need for SNF placement upon discharge: not anticipated   Patient Condition: [facility] , and patient and son. I met with patient at the bedside and discussed via phone for inpatient rehabilitation assessment.  Patient will benefit from ongoing PT, OT, and SLP, can actively participate in 3 hours of therapy a day 5 days of the week, and can make measurable gains during the admission.  Patient will also benefit from the coordinated team approach during an Inpatient Acute  Rehabilitation admission.  The patient will receive intensive therapy as well as Rehabilitation physician, nursing, social worker, and care management interventions.  Due to safety, disease management, medication administration, pain management, and patient education the patient requires 24 hour a day rehabilitation nursing.  The patient is currently mod +2 with mobility and basic ADLs.  Discharge setting and therapy post discharge at home with home health is anticipated.  Patient has agreed to participate in the Acute Inpatient Rehabilitation Program and will admit today.   Preadmission Screen Completed By:  CaMichel SanteePT, DPT 03/17/2021 11:51 AM ______________________________________________________________________   Discussed status with  Dr. Naaman Plummer on 03/17/21  at 12:06 PM   and received approval for admission today.   Admission Coordinator:  Michel Santee, PT, DPT time 12:06 PM Sudie Grumbling 03/17/21     Assessment/Plan: Diagnosis: CVA PRES Does the need for close, 24 hr/day Medical supervision in concert with the patient's rehab needs make it unreasonable for this patient to be served in a less intensive setting? Yes Co-Morbidities requiring supervision/potential complications: HTN, etoh abuse, afib Due to bladder management, bowel management, safety, skin/wound care, disease management, medication administration, pain management, and patient education, does the patient require 24 hr/day rehab nursing? Yes Does the patient require coordinated care of a physician, rehab nurse, PT, OT, and SLP to address physical and functional deficits in the context of the above medical diagnosis(es)? Yes Addressing deficits in the following areas: balance, endurance, locomotion, strength, transferring, bowel/bladder control, bathing, dressing, feeding, grooming, toileting, cognition, speech, language, swallowing, and psychosocial support Can the patient actively participate in an intensive therapy program of at  least 3 hrs of therapy 5 days a week? Yes The potential for patient to make measurable gains while on inpatient rehab is excellent Anticipated functional outcomes upon discharge from inpatient rehab: supervision and min assist PT, supervision and min assist OT, supervision SLP Estimated rehab length of stay to reach the above functional goals is: 18-22 days Anticipated discharge destination: Home 10. Overall Rehab/Functional Prognosis: good     MD Signature: Meredith Staggers, MD, [facility] 03/17/2021

## 2021-03-17 NOTE — Progress Notes (Signed)
Physical Therapy Treatment Patient Details Name: Patrick Padilla MRN: [id number] DOB: 02-13-1956 Today's Date: 03/17/2021    History of Present Illness 65 yo patient who presented to ED with L hemiplegia and R sided jerking after attempted cardioversion by EMS for afib with RVR.  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 Few and  punctate bifrontal peripheral infarcts. Recent medial left thalamic infarct which may be subacute. 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 Extubated 9/5. PMH:  hx EtOH abuse and  afib (unclear if on AC), anxiety HTN PSH: cardioversion, L THA    PT Comments    Pt received in supine, agreeable to therapy session although lethargic and with good participation and fair tolerance for seated/standing activities. Per RN pt had ativan and poor sleep overnight likely contributing to his poor seated/standing balance and increased R lean today. Pt needing up to +2 maxA for standing tasks and unable to progress gait this session. Recommend Stedy for safety with chair transfers today due to poor attention to task and difficulty with sidesteps. Pt returned to sidelying in bed at end of session with bed alarm on due to drowsiness. Pt continues to benefit from PT services to progress toward functional mobility goals.    Follow Up Recommendations  CIR     Equipment Recommendations  Other (comment) (TBD next venue)    Recommendations for Other Services Rehab consult     Precautions / Restrictions Precautions Precautions: Fall Precaution Comments: impulsive Restrictions Weight Bearing Restrictions: No    Mobility  Bed Mobility Overal bed mobility: Needs Assistance Bed Mobility: Supine to Sit;Sit to Supine Rolling: Min assist   Supine to sit: Mod assist;+2 for physical assistance   Sit to sidelying: Min assist;+2 for safety/equipment General bed mobility comments: increased time for single step commands, with need  at times for hand over hand to initate action towards L side. Pt need max cues for attention to L side today and more fatigued after working with OT earlier in day    Transfers Overall transfer level: Needs assistance Equipment used: 2 person hand held assist Transfers: Sit to/from Stand Sit to Stand: Max assist;+2 physical assistance         General transfer comment: pt with poor standing balance this date, tending to lean to R side and having difficulty following cues to perform sidesteps toward HOB/chair. Defer chair transfer for safety due to pt lethargy/poor blaance and task sequencing today.  Ambulation/Gait Ambulation/Gait assistance: Max assist;+2 physical assistance Gait Distance (Feet): 2 Feet Assistive device: 2 person hand held assist Gait Pattern/deviations: Step-to pattern;Leaning posteriorly;Shuffle;Decreased step length - right;Decreased step length - left (R lean)     General Gait Details: R lean in stance and difficulty weight shifting for steps; increased assist and max cues for sidesteps toward [facility], pt able to perform 1-2 sidesteps on second standing trial   Stairs             Wheelchair Mobility    Modified Rankin (Stroke Patients Only) Modified Rankin (Stroke Patients Only) Pre-Morbid Rankin Score: No significant disability Modified Rankin: Moderately severe disability     Balance Overall balance assessment: Needs assistance Sitting-balance support: Feet supported;Bilateral upper extremity supported Sitting balance-Leahy Scale: Poor Sitting balance - Comments: pt pushing more toward R side today and needs cues for placing hand on knees and max postural cues to reach midline; variable min guard to modA postural support; drowsy Postural control: Right lateral lean Standing balance support: Bilateral upper extremity supported;During  functional activity Standing balance-Leahy Scale: Zero Standing balance comment: +2 maxA today, heavy R lean                             Cognition Arousal/Alertness: Lethargic;Suspect due to medications Behavior During Therapy: Impulsive Overall Cognitive Status: Impaired/Different from baseline Area of Impairment: Orientation;Attention;Memory;Following commands;Safety/judgement;Awareness;Problem solving                 Orientation Level: Disoriented to;Time;Situation (pt able to state "2020" when asked for year and "august... december" when asked for current month but names hospital properly) Current Attention Level: Focused Memory: Decreased short-term memory;Decreased recall of precautions Following Commands: Follows one step commands with increased time;Follows one step commands inconsistently Safety/Judgement: Decreased awareness of safety;Decreased awareness of deficits Awareness: Emergent;Intellectual Problem Solving: Slow processing;Decreased initiation;Difficulty sequencing;Requires verbal cues;Requires tactile cues General Comments: increased time for all processing of single step directions, benefiting this date from visual demo and assist at time for intiation of task but pt unable to sequence today things he performed in previous session (poor seated anterior/lateral scooting, poor supine scooting toward HOB, poor standing posture). Per RN he didn't sleep well and was given ativan overnight which likely contributed to his presentation today.      Exercises Shoulder Exercises Shoulder Flexion: PROM;AAROM;Left;10 reps;Seated Shoulder ABduction: PROM;AAROM;Left;10 reps Elbow Flexion: PROM;AAROM;Left;10 reps Elbow Extension: PROM;AAROM;Left;10 reps Wrist Flexion: PROM;AAROM;Left;10 reps Wrist Extension: PROM;AAROM;Left;10 reps Other Exercises Other Exercises: seated BLE AROM: ankle pumps, LAQ, hip flexion x10 reps ea with max cues for posture/sequencing Other Exercises: supine BLE AAROM: ankle pumps, heel slides x5 reps ea for warm-up    General Comments General comments  (skin integrity, edema, etc.): BP elevated 151/99 (115) supine prior to mobility, RN notified. SpO2 95% on RA and HR 81-95 bpm with exertion; pt bed sheets wet/saturated (although condom catheter still appears to be attached) so pt stood at bedside with NT present to help change sheets/pad under him during therapy session      Pertinent Vitals/Pain Pain Assessment: Faces Faces Pain Scale: Hurts a little bit Pain Location: grimacing with some mobility tasks, pt not able to localize pain today Pain Descriptors / Indicators: Grimacing Pain Intervention(s): Limited activity within patient's tolerance;Monitored during session;Repositioned     PT Goals (current goals can now be found in the care plan section) Acute Rehab PT Goals Patient Stated Goal: to move better PT Goal Formulation: With patient/family Time For Goal Achievement: 03/27/21 Progress towards PT goals: Progressing toward goals    Frequency    Min 4X/week      PT Plan Current plan remains appropriate       AM-PAC PT "6 Clicks" Mobility   Outcome Measure  Help needed turning from your back to your side while in a flat bed without using bedrails?: A Lot Help needed moving from lying on your back to sitting on the side of a flat bed without using bedrails?: A Lot Help needed moving to and from a bed to a chair (including a wheelchair)?: Total Help needed standing up from a chair using your arms (e.g., wheelchair or bedside chair)?: A Lot Help needed to walk in hospital room?: Total Help needed climbing 3-5 steps with a railing? : Total 6 Click Score: 9    End of Session Equipment Utilized During Treatment: Gait belt Activity Tolerance: Patient limited by lethargy;Other (comment) (had ativan overnight per RN) Patient left: in bed;with call bell/phone within reach;with bed alarm set;Other (comment);with  nursing/sitter in room (sidelying toward L, heels floated) Nurse Communication: Mobility status;Other (comment) (pt  lethargic, maybe due to meds? unsafe to get him OOB today due to lethargy) PT Visit Diagnosis: Unsteadiness on feet (R26.81);Difficulty in walking, not elsewhere classified (R26.2)     Time: 8676-1950 PT Time Calculation (min) (ACUTE ONLY): 32 min  Charges:  $Therapeutic Activity: 23-37 mins                     Evin Chirco P., PTA Acute Rehabilitation Services Pager: [phone] Office: [phone]    Angus Palms 03/17/2021, 2:32 PM

## 2021-03-17 NOTE — H&P (Signed)
Physical Medicine and Rehabilitation Admission H&P     CC: Stroke with functional deficits     HPI: Patrick Padilla is a 65 year old RH-male with history of A fib s/p CV in the past, ETOH abuse who activated EMS with reports of confusion, CP and diaphoresis, was found to be in A fib with RVR and CV attempts enroute to ED. He was hemiplegic with RUE jerking moments, was obtundent with hypoxic respiratory failure requiring intubation in ED. CTP showed R-MCA infarct with 11 mm core and 165 ml penumbra. He underwent emergent cerebral angio with thrombectomy  R-M1/MCA occlusion with complete recanalization by Dr. Karenann Cai. EEG done negative for epileptiform activity. He was placed on Cleveprex for BP management and started on Keppra due to concerns of seizure. LTM-EEG showed diffuse encephalopathy without seizure or epileptiform discharges and he tolerated extubation by 09/05. Mentation has been improving with increase in movement on the left and has had bouts of agitation felt to be due to ETOH withdrawal therefore started on CIWA protocol.   Dr. Erlinda Hong felt that stroke was embolic due to A fib s/p cardioversion and follow up MRI head showed acute infarct in right basal ganglia, few punctate bifrontal peripheral infarcts and recent left thalamic infarct question subacute as well as PRES appearance in bilateral parieto-occipital cortex and small volume blood products at right sylvian fissure. He was started on ASA due to moderate size stroke with petechial hemorrhage. He has had bouts of A fib and started on IV heparin and transitioned to Eliquis. He did have rise in WBC to 20.6 with 1/2 BC positive for staph epi felt to be contaminant and leucocytosis has resolved, AKI with oliguria without signs of retention--SCr 1.37-->0.76.  He also reported fall with left shoulder pain and Xrays of left shoulder negative for Fx.  He continues to be limited by left sided weakness with fatigue, mild left inattention,  cognitive deficits,  and lack of awareness of deficits. CIR recommended due to functional decline.  .    Review of Systems  Unable to perform ROS: Mental acuity          Past Medical History:  Diagnosis Date   Anxiety     Elevated LFTs     History of colon polyps     HLD (hyperlipidemia)     HTN (hypertension)     Localized superficial swelling, mass, or lump             Past Surgical History:  Procedure Laterality Date   CARDIOVERSION N/A 02/05/2019    Procedure: CARDIOVERSION;  Surgeon: Josue Hector, MD;  Location: [facility];  Service: Cardiovascular;  Laterality: N/A;   CERVICAL DISC SURGERY       IR CT HEAD LTD   03/12/2021   IR PERCUTANEOUS ART THROMBECTOMY/INFUSION INTRACRANIAL INC DIAG ANGIO   03/12/2021   IR US GUIDE VASC ACCESS RIGHT   03/15/2021   LEG SURGERY        right leg and ankle   NECK SURGERY        cyst removed   RADIOLOGY WITH ANESTHESIA N/A 03/12/2021    Procedure: RADIOLOGY WITH ANESTHESIA;  Surgeon: Radiologist, Medication, MD;  Location: [facility];  Service: Radiology;  Laterality: N/A;   TEE WITHOUT CARDIOVERSION N/A 02/05/2019    Procedure: TRANSESOPHAGEAL ECHOCARDIOGRAM (TEE);  Surgeon: Josue Hector, MD;  Location: [facility] ENDOSCOPY;  Service: Cardiovascular;  Laterality: N/A;   TOTAL HIP ARTHROPLASTY  left           Family History  Problem Relation Age of Onset   Hepatitis Mother     Clotting disorder Mother     Stroke Mother     Thyroid disease Mother     Colon cancer Father          ?   Rheumatic fever Brother     Heart disease Brother        Social History: Single. Mechanic--travels and services fabrication equipment. Lives with family. He reports that he has been smoking--2 PPD.  He has never used smokeless tobacco. He drinks one quat of whiskey daily per reports.  He reports that he does not use drugs.     Allergies: No Known Allergies           Medications Prior to Admission  Medication Sig Dispense Refill   metoprolol  tartrate (LOPRESSOR) 100 MG tablet Take 1 tablet by mouth twice daily 180 tablet 2   naproxen sodium (ALEVE) 220 MG tablet Take 220 mg by mouth daily as needed (pain/headache). As needed       PARoxetine (PAXIL) 20 MG tablet Take 20 mg by mouth daily.       telmisartan (MICARDIS) 40 MG tablet Take 40 mg by mouth at bedtime.       ELIQUIS 5 MG TABS tablet TAKE 1 TABLET BY MOUTH TWICE DAILY 180 tablet 1   naproxen (NAPROSYN) 500 MG tablet Take 500 mg by mouth 2 (two) times daily as needed for mild pain.          Drug Regimen Review  Drug regimen was reviewed and remains appropriate with no significant issues identified   Home: Home Living Family/patient expects to be discharged to:: Private residence Living Arrangements: Children (son, dtr-in-law and grandchild) Available Help at Discharge: Family, Available PRN/intermittently Type of Home: House Home Access: Stairs to enter Technical brewer of Steps: 1 Entrance Stairs-Rails: Left Home Layout: One level Bathroom Shower/Tub: Chiropodist: Standard Home Equipment: None Additional Comments: need to confirm; pt unreliable   Functional History: Prior Function Level of Independence: Independent Comments: per son pt was sedentary and spent most of his days in a recliner however was independent with mobility and ADL   Functional Status:  Mobility: Bed Mobility Overal bed mobility: Needs Assistance Bed Mobility: Rolling, Sit to Sidelying Rolling: Min assist Supine to sit: Mod assist, +2 for physical assistance Sit to sidelying: Min assist, +2 for physical assistance General bed mobility comments: increased time to process 1-step cues for reverse log roll and needs minA to get each LE over EOB; flat HOB, minA to rotate hips to return supine; pt able to pull himself up toward [facility] with hand over hand cues and minA. Transfers Overall transfer level: Needs assistance Equipment used: 2 person hand held  assist Transfers: Sit to/from Stand, Stand Pivot Transfers Sit to Stand: Mod assist, +2 physical assistance Stand pivot transfers: +2 physical assistance, Mod assist General transfer comment: tactile cues to power up, modA x2 to steady, pt with posterior lean, pt with increased lean in afternoon and more difficulty picking feet up, needs +2 modA for return transfer to EOB; also performed seated scooting toward [facility] with mod cues and +2 min/modA via bed pad Ambulation/Gait Ambulation/Gait assistance: Mod assist, +2 physical assistance Gait Distance (Feet): 5 Feet (pre-gait marching x10 reps, seated break, then 41f from chair>bed.) Assistive device: 2 person hand held assist Gait Pattern/deviations: Step-to pattern, Leaning posteriorly, Shuffle, Decreased  step length - right, Decreased step length - left General Gait Details: downward gaze, pt able to perform forward/retro steps and standing hip flexion. Pt took seated break then lateral steps toward bed (bed moved further away prior to transfer to see if he could progress distance), pt needed to sit after ~34f. Increased time to perform; may be able to trial RW next session to see if this helps posture Gait velocity: grossly <0.4 m/s   ADL: ADL Overall ADL's : Needs assistance/impaired Eating/Feeding: NPO Eating/Feeding Details (indicate cue type and reason): ice chips/water; awaiting swallow eval Grooming: Moderate assistance Upper Body Bathing: Moderate assistance Lower Body Bathing: Maximal assistance, Bed level Upper Body Dressing : Moderate assistance Upper Body Dressing Details (indicate cue type and reason): pt attempting to help however missing armholes Lower Body Dressing: Total assistance Toileting- Clothing Manipulation and Hygiene: Total assistance Toileting - Clothing Manipulation Details (indicate cue type and reason): incontinenet of BM; Condom cath fell/pulled off Functional mobility during ADLs:  (will need +2)    Cognition: Cognition Overall Cognitive Status: Impaired/Different from baseline Arousal/Alertness: Awake/alert Orientation Level: Oriented to person, Oriented to place Year: Other (Comment) Month:  (2003) Day of Week: Incorrect Attention: Focused, Sustained Focused Attention: Impaired Focused Attention Impairment: Functional basic Sustained Attention: Impaired Sustained Attention Impairment: Functional basic Memory: Impaired Memory Impairment: Storage deficit Awareness: Impaired Awareness Impairment: Intellectual impairment, Emergent impairment, Anticipatory impairment Problem Solving: Impaired Problem Solving Impairment: Functional basic Executive Function: Self Monitoring, Self Correcting Self Monitoring: Impaired Self Monitoring Impairment: Functional basic Behaviors: Restless Safety/Judgment: Impaired Cognition Arousal/Alertness: Awake/alert Behavior During Therapy: Impulsive Overall Cognitive Status: Impaired/Different from baseline Area of Impairment: Orientation, Attention, Memory, Following commands, Safety/judgement, Awareness, Problem solving Orientation Level: Disoriented to, Time (stated hospital and stroke) Current Attention Level: Focused Memory: Decreased short-term memory, Decreased recall of precautions Following Commands: Follows one step commands with increased time, Follows one step commands inconsistently Safety/Judgement: Decreased awareness of safety, Decreased awareness of deficits (impulsive) Awareness: Emergent, Intellectual Problem Solving: Slow processing, Decreased initiation, Difficulty sequencing, Requires verbal cues, Requires tactile cues General Comments: pt needs 1-step cues for safety with each mobility task; pt impulsive to get out of chair but decreased short term memory from discussion with PTA 90 mins earlier that he needs to press call bell. Pt received in chair trying to remove Posey lap alarm.     Blood pressure (!) 161/98, pulse 88,  temperature 98.8 F (37.1 C), temperature source Oral, resp. rate 18, height 6' (1.829 m), weight 90 kg, SpO2 97 %. Physical Exam Vitals and nursing note reviewed.  Constitutional:      General: He is not in acute distress.    Appearance: Normal appearance.     Comments: Had difficulty staying awake. Audible wheezing at times. Attempted to get out of bed to urinate (has condom cath)--lacks safety awareness.   HENT:     Head: Normocephalic and atraumatic.     Nose: Nose normal.     Mouth/Throat:     Mouth: Mucous membranes are moist.     Comments: Poor dentition. Missing most of teeth Eyes:     Extraocular Movements: Extraocular movements intact.     Conjunctiva/sclera: Conjunctivae normal.  Cardiovascular:     Rate and Rhythm: Normal rate and regular rhythm.     Heart sounds: No murmur heard.   No gallop.  Pulmonary:     Effort: Pulmonary effort is normal. No respiratory distress.     Breath sounds: No wheezing or rales.  Abdominal:  General: Abdomen is flat. Bowel sounds are normal.     Palpations: Abdomen is soft.  Musculoskeletal:     Cervical back: Normal range of motion.     Comments: Right knee effusion and pain with palpation/ROM attemtps  Skin:    General: Skin is warm.  Neurological:     Mental Status: He is alert.     Comments: HOH. Oriented to self only[place] Delayed processing with internal distraction and had difficulty answering simple questions--age " pushing 60"  Month "August/December' Moves RUE 5/5. RLE limited dt knee pain. LUE 2+ to 3/5 prox to distal. LLE 2 to 2+/5 prox to distal. Senses pain and light touch in all 4.   Psychiatric:     Comments: Flat, somewhat cooperative      Lab Results Last 48 Hours        Results for orders placed or performed during the hospital encounter of 03/12/21 (from the past 48 hour(s))  APTT     Status: Abnormal    Collection Time: 03/15/21  6:10 PM  Result Value Ref Range    aPTT 37 (H) 24 - 36 seconds       Comment:        IF BASELINE aPTT IS ELEVATED, SUGGEST PATIENT RISK ASSESSMENT BE USED TO DETERMINE APPROPRIATE ANTICOAGULANT THERAPY. Performed at [facility], [place] [address] [place], [place] [address]    Glucose, capillary     Status: Abnormal    Collection Time: 03/15/21  8:23 PM  Result Value Ref Range    Glucose-Capillary 116 (H) 70 - 99 mg/dL      Comment: Glucose reference range applies only to samples taken after fasting for at least 8 hours.  Glucose, capillary     Status: Abnormal    Collection Time: 03/16/21 12:04 AM  Result Value Ref Range    Glucose-Capillary 105 (H) 70 - 99 mg/dL      Comment: Glucose reference range applies only to samples taken after fasting for at least 8 hours.  Basic metabolic panel     Status: Abnormal    Collection Time: 03/16/21  2:46 AM  Result Value Ref Range    Sodium 137 135 - 145 mmol/L    Potassium 3.6 3.5 - 5.1 mmol/L    Chloride 104 98 - 111 mmol/L    CO2 22 22 - 32 mmol/L    Glucose, Bld 93 70 - 99 mg/dL      Comment: Glucose reference range applies only to samples taken after fasting for at least 8 hours.    BUN 11 8 - 23 mg/dL    Creatinine, Ser 0.81 0.61 - 1.24 mg/dL    Calcium 8.6 (L) 8.9 - 10.3 mg/dL    GFR, Estimated >60 >60 mL/min      Comment: (NOTE) Calculated using the CKD-EPI Creatinine Equation (2021)      Anion gap 11 5 - 15      Comment: Performed at [facility] [address], [place], [place] [address]  Magnesium     Status: None    Collection Time: 03/16/21  2:46 AM  Result Value Ref Range    Magnesium 1.8 1.7 - 2.4 mg/dL      Comment: Performed at [facility] [address] [place], [place] [address]  CBC     Status: Abnormal    Collection Time: 03/16/21  2:46 AM  Result Value Ref Range    WBC 10.1 4.0 -  10.5 K/uL    RBC 4.43 4.22 - 5.81 MIL/uL    Hemoglobin 15.2 13.0 - 17.0 g/dL    HCT 45.0 39.0 - 52.0 %    MCV 101.6 (H) 80.0 - 100.0 fL    MCH 34.3 (H) 26.0 - 34.0 pg     MCHC 33.8 30.0 - 36.0 g/dL    RDW 13.0 11.5 - 15.5 %    Platelets 181 150 - 400 K/uL    nRBC 0.0 0.0 - 0.2 %      Comment: Performed at [facility] [address], [place], [place] [address]  Heparin level (unfractionated)     Status: Abnormal    Collection Time: 03/16/21  2:46 AM  Result Value Ref Range    Heparin Unfractionated <0.10 (L) 0.30 - 0.70 IU/mL      Comment: (NOTE) The clinical reportable range upper limit is being lowered to >1.10 to align with the FDA approved guidance for the current laboratory assay.   If heparin results are below expected values, and patient dosage has  been confirmed, suggest follow up testing of antithrombin III levels. Performed at [facility], [place] [address], [place], [place] [address]    APTT     Status: None    Collection Time: 03/16/21  2:46 AM  Result Value Ref Range    aPTT 33 24 - 36 seconds      Comment: Performed at [facility] [address], [place], [place] [address]  Glucose, capillary     Status: None    Collection Time: 03/16/21  3:43 AM  Result Value Ref Range    Glucose-Capillary 91 70 - 99 mg/dL      Comment: Glucose reference range applies only to samples taken after fasting for at least 8 hours.  Glucose, capillary     Status: None    Collection Time: 03/16/21  8:01 AM  Result Value Ref Range    Glucose-Capillary 89 70 - 99 mg/dL      Comment: Glucose reference range applies only to samples taken after fasting for at least 8 hours.    Comment 1 Notify RN      Comment 2 Document in Chart    Heparin level (unfractionated)     Status: Abnormal    Collection Time: 03/16/21 11:06 AM  Result Value Ref Range    Heparin Unfractionated <0.10 (L) 0.30 - 0.70 IU/mL      Comment: (NOTE) The clinical reportable range upper limit is being lowered to >1.10 to align with the FDA approved guidance for the current laboratory assay.   If heparin results are below expected values, and patient dosage has  been  confirmed, suggest follow up testing of antithrombin III levels. Performed at [facility], [place] [address], [place], [place] [address]    Glucose, capillary     Status: Abnormal    Collection Time: 03/16/21 12:04 PM  Result Value Ref Range    Glucose-Capillary 114 (H) 70 - 99 mg/dL      Comment: Glucose reference range applies only to samples taken after fasting for at least 8 hours.    Comment 1 Notify RN      Comment 2 Document in Chart    Glucose, capillary     Status: Abnormal    Collection Time: 03/16/21  4:43 PM  Result Value Ref Range    Glucose-Capillary 117 (H) 70 - 99 mg/dL      Comment: Glucose  reference range applies only to samples taken after fasting for at least 8 hours.    Comment 1 Notify RN      Comment 2 Document in Chart    Heparin level (unfractionated)     Status: Abnormal    Collection Time: 03/16/21  6:56 PM  Result Value Ref Range    Heparin Unfractionated 0.13 (L) 0.30 - 0.70 IU/mL      Comment: (NOTE) The clinical reportable range upper limit is being lowered to >1.10 to align with the FDA approved guidance for the current laboratory assay.   If heparin results are below expected values, and patient dosage has  been confirmed, suggest follow up testing of antithrombin III levels. Performed at [facility], [place] [address] [place], [place] [address]    Basic metabolic panel     Status: Abnormal    Collection Time: 03/17/21 12:18 AM  Result Value Ref Range    Sodium 138 135 - 145 mmol/L    Potassium 3.5 3.5 - 5.1 mmol/L    Chloride 105 98 - 111 mmol/L    CO2 25 22 - 32 mmol/L    Glucose, Bld 99 70 - 99 mg/dL      Comment: Glucose reference range applies only to samples taken after fasting for at least 8 hours.    BUN 9 8 - 23 mg/dL    Creatinine, Ser 0.76 0.61 - 1.24 mg/dL    Calcium 8.7 (L) 8.9 - 10.3 mg/dL    GFR, Estimated >60 >60 mL/min      Comment: (NOTE) Calculated using the CKD-EPI Creatinine Equation (2021)      Anion gap 8  5 - 15      Comment: Performed at [facility] [address], [place], [place] [address]  Magnesium     Status: None    Collection Time: 03/17/21 12:18 AM  Result Value Ref Range    Magnesium 1.9 1.7 - 2.4 mg/dL      Comment: Performed at [facility] [address] [place], [place] [address]  CBC     Status: Abnormal    Collection Time: 03/17/21 12:18 AM  Result Value Ref Range    WBC 11.0 (H) 4.0 - 10.5 K/uL    RBC 4.48 4.22 - 5.81 MIL/uL    Hemoglobin 15.2 13.0 - 17.0 g/dL    HCT 45.0 39.0 - 52.0 %    MCV 100.4 (H) 80.0 - 100.0 fL    MCH 33.9 26.0 - 34.0 pg    MCHC 33.8 30.0 - 36.0 g/dL    RDW 12.7 11.5 - 15.5 %    Platelets 216 150 - 400 K/uL    nRBC 0.0 0.0 - 0.2 %      Comment: Performed at [facility], [place] [address] [place], [place] [address]  Heparin level (unfractionated)     Status: Abnormal    Collection Time: 03/17/21 12:18 AM  Result Value Ref Range    Heparin Unfractionated 0.23 (L) 0.30 - 0.70 IU/mL      Comment: (NOTE) The clinical reportable range upper limit is being lowered to >1.10 to align with the FDA approved guidance for the current laboratory assay.   If heparin results are below expected values, and patient dosage has  been confirmed, suggest follow up testing of antithrombin III levels. Performed at [facility], [place] [address] [place], [place] [address]    Glucose, capillary  Status: None    Collection Time: 03/17/21  3:11 AM  Result Value Ref Range    Glucose-Capillary 98 70 - 99 mg/dL      Comment: Glucose reference range applies only to samples taken after fasting for at least 8 hours.  Glucose, capillary     Status: Abnormal    Collection Time: 03/17/21  8:14 AM  Result Value Ref Range    Glucose-Capillary 115 (H) 70 - 99 mg/dL      Comment: Glucose reference range applies only to samples taken after fasting for at least 8 hours.    Comment 1 Notify RN      Comment 2 Document in Chart         Imaging  Results (Last 48 hours)  IR US Guide Vasc Access Right   Result Date: 03/15/2021 INDICATION: 65 year old male with history significant for EtOH abuse and atrial fibrillation presenting with left hemiplegia, right sided jerking after attempted cardioversion by EMS for afib with RVR. His last known well was 10 p.m. on March 11, 2021. NIHSS at presentation 26; premorbid Rankin scale 2. Head CT showed a right basal ganglia infarct (ASPECTS 9). CT angiogram of the head and neck showed an occlusion of the right M1/MCA segment with poor collaterals. He was transferred to our service for a diagnostic cerebral angiogram and mechanical thrombectomy. EXAM: GUIDED VASCULAR ACCESS DIAGNOSTIC CEREBRAL ANGIOGRAM MECHANICAL THROMBECTOMY FLAT PANEL HEAD CT COMPARISON:  CT angiogram of the head and neck March 12, 2021 MEDICATIONS: No antibiotic administered. ANESTHESIA/SEDATION: The procedure was performed under general anesthesia. CONTRAST:  65 mL of Omnipaque 240 milligram/mL FLUOROSCOPY TIME:  Fluoroscopy Time: 21 minutes 48 seconds (841 mGy). COMPLICATIONS: None immediate. TECHNIQUE: Informed written consent was not obtained. Multiple attempts to reach next of kin were unsuccessful. Discussion held with Dr. Quinn Axe (neurology) and decision made to proceed with mechanical thrombectomy given imminent risk of significant morbidity and mortality in the absence of emergency endovascular treatment of right MCA occlusion. Maximal Sterile Barrier Technique was utilized including caps, mask, sterile gowns, sterile gloves, sterile drape, hand hygiene and skin antiseptic. A timeout was performed prior to the initiation of the procedure. The right groin was prepped and draped in the usual sterile fashion. Using a micropuncture kit and the modified Seldinger technique, access was gained to the right common femoral artery and an 8 French sheath was placed. Real-time ultrasound guidance was utilized for vascular access including the  acquisition of a permanent ultrasound image documenting patency of the accessed vessel. Under fluoroscopy, a Zoom 88 guide catheter was navigated over a 6 [place] Berenstein 2 catheter and a 0.035" Terumo Glidewire into the aortic arch. The catheter was placed into the right common carotid artery and then advanced into the right internal carotid artery. The inner catheter was removed. Frontal and lateral angiograms of the head were obtained. FINDINGS: 1. The femoral artery is patent, with adequate caliber for vascular access. 2. Occlusion of the right M1/MCA just distal to the origin of the anterior temporal branch. PROCEDURE: Under biplane roadmap, a zoom 71 aspiration catheter was navigated over an Aristotle 24 microguidewire into the cavernous segment of the right ICA. The aspiration catheter was then advanced to the level of occlusion and connected to an aspiration pump. Continuous aspiration was performed for 2 minutes. The aspiration catheter was subsequently removed under constant aspiration. The guide catheter was aspirated for debris. Due to absent back flow in guide catheter, it was withdrawn under continuous aspiration. Under fluoroscopy, the Zoom  88 guide catheter was navigated over a 6 [place] Berenstein 2 catheter and a 0.035" Terumo Glidewire into the aortic arch. The catheter was placed into the right common carotid artery and then advanced into the right internal carotid artery. The inner catheter was removed. Frontal and lateral angiograms of the head were obtained. Recanalization of the right M1/MCA segment seen with occlusion of the distal right M2/MCA anterior division branch. Under biplane roadmap, a Zoom 35 aspiration catheter was navigated over an Aristotle 14 microguidewire into the cavernous segment of the right ICA. The aspiration catheter was then advanced to the level of occlusion and connected to an aspiration pump. Continuous aspiration was performed for 2 minutes. The guide catheter was  connected to a VacLok syringe. The aspiration catheter was subsequently removed under constant aspiration. The guide catheter was aspirated for debris. Follow-up angiogram showed persistent occlusion of the distal right M3/MCA segment. Under biplane roadmap, a Zoom 35 aspiration catheter was navigated over an Aristotle 14 microguidewire into the cavernous segment of the right ICA. The aspiration catheter was then advanced to the level of occlusion in the right M3 segment and connected to an aspiration pump. Continuous aspiration was performed for 2 minutes. The guide catheter was connected to a VacLok syringe. The aspiration catheter was subsequently removed under constant aspiration. The guide catheter was aspirated for debris. Right ICA angiogram showed complete reperfusion of the right MCA vascular tree (TICI3). Flat panel CT of the head was obtained and post processed in a separate workstation with concurrent attending physician supervision. Selected images were sent to PACS. Trace subarachnoid hemorrhage in the right sylvian fissure noted. A right common femoral artery angiogram was obtained in right anterior oblique view. The puncture is at the level of the common femoral artery which has adequate caliber for closure device. The femoral sheath was then exchanged over the wire for a Perclose pro style which was then utilized for access closure. Immediate hemostasis was achieved. IMPRESSION: 1. Successful mechanical thrombectomy performed with direct contact aspiration for treatment of a proximal right M1/MCA occlusion with complete recanalization after 3 passes (TICI 3). 2. Trace subarachnoid hemorrhage in the right sylvian fissure on postprocedural flat panel head CT. PLAN: 1. Transfer to ICU. 2. Follow-up head CT in 3 hours to ensure stability of trace subarachnoid hemorrhage in right sylvian fissure. Electronically Signed   By: Pedro Earls M.D.   On: 03/15/2021 16:16    DG Shoulder Left  Port   Result Date: 03/16/2021 CLINICAL DATA:  Fall. EXAM: LEFT SHOULDER COMPARISON:  None. FINDINGS: There is no evidence of fracture or dislocation. There is no evidence of arthropathy or other focal bone abnormality. Soft tissues are unremarkable. IMPRESSION: Negative. Electronically Signed   By: Ronney Asters M.D.   On: 03/16/2021 16:11             Medical Problem List and Plan: 1.  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  Antithrombotics: -DVT/anticoagulation:  Pharmaceutical: Other (comment)--transitioned to Eliquis on 09/08             -antiplatelet therapy: N/A 3. Left shoulder/right knee pain/Pain Management: Local measures with Voltaren gel/ice/heat              --will Xray right knee to rule out trauma.  -also  could be Gout -pt denied major knee pain PTA 4. Mood: LCSW to follow for evaluation and support.              -antipsychotic agents: N/A 5. Neuropsych: This patient is not fully capable of making decisions on his own behalf. 6. Skin/Wound Care: Routine pressure relief measures.  7. Fluids/Electrolytes/Nutrition: Monitor I/O. Check CMET in am.  8. 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              --Monitor HR TID--continue Eliquis and Lopressor 9. HTN: Monitor BP TID--on Micardis, Lopressor,  10. RUE shaking movements: Felt to be postural tremor and Keppra d/c 09/06 11. Leucocytosis: Monitor for fevers and other signs of infection.  12. Hyperlipidemia: Now on Lipitor              Bary Leriche, PA-C 03/17/2021   I have personally performed a face to face diagnostic evaluation of this patient and formulated the key components of the plan.  Additionally, I have personally reviewed laboratory data, imaging studies, as well as relevant notes and concur with the physician assistant's documentation above.  The patient's status has not changed from  the original H&P.  Any changes in documentation from the acute care chart have been noted above.  Meredith Staggers, MD, [facility]

## 2021-03-17 NOTE — Progress Notes (Signed)
Order to [facility].  Report called.  Pt belonging returned and transferred with [facility] to [facility].

## 2021-03-17 NOTE — Plan of Care (Signed)
  Problem: Education: Goal: Knowledge of disease or condition will improve Outcome: Progressing Goal: Knowledge of secondary prevention will improve Outcome: Progressing Goal: Knowledge of patient specific risk factors addressed and post discharge goals established will improve Outcome: Progressing Goal: Individualized Educational Video(s) Outcome: Progressing   Problem: Coping: Goal: Will verbalize positive feelings about self Outcome: Progressing Goal: Will identify appropriate support needs Outcome: Progressing   Problem: Health Behavior/Discharge Planning: Goal: Ability to manage health-related needs will improve Outcome: Progressing   Problem: Self-Care: Goal: Ability to participate in self-care as condition permits will improve Outcome: Progressing Goal: Verbalization of feelings and concerns over difficulty with self-care will improve Outcome: Progressing Goal: Ability to communicate needs accurately will improve Outcome: Progressing   Problem: Nutrition: Goal: Risk of aspiration will decrease Outcome: Progressing Goal: Dietary intake will improve Outcome: Progressing   Problem: Ischemic Stroke/TIA Tissue Perfusion: Goal: Complications of ischemic stroke/TIA will be minimized Outcome: Progressing   Problem: Education: Goal: Knowledge of disease or condition will improve Outcome: Progressing Goal: Understanding of discharge needs will improve Outcome: Progressing   Problem: Health Behavior/Discharge Planning: Goal: Ability to identify changes in lifestyle to reduce recurrence of condition will improve Outcome: Progressing Goal: Identification of resources available to assist in meeting health care needs will improve Outcome: Progressing   Problem: Physical Regulation: Goal: Complications related to the disease process, condition or treatment will be avoided or minimized Outcome: Progressing   Problem: Safety: Goal: Ability to remain free from injury will  improve Outcome: Progressing

## 2021-03-17 NOTE — PMR Pre-admission (Signed)
PMR Admission Coordinator Pre-Admission Assessment  Patient: Patrick Padilla is an 65 y.o., male MRN: [id number] DOB: August 15, 1955 Height: 6' (182.9 cm) Weight: 90 kg  Insurance Information HMO:     PPO:      PCP:      IPA:      80/20:      OTHER:  PRIMARY: Medicare A/B      Policy#: [id number]      Subscriber: pt CM Name:       Phone#:      Fax#:  Pre-Cert#:       Employer:  Benefits:  Phone #:      Name:  Eff. Date: A 02/07/13, B 09/08/18     Deduct: $1556      Out of Pocket Max: n/a      Life Max: n/a CIR: 100%      SNF: 20 full days Outpatient: 80%     Co-Ins: 20% Home Health: 100%      Co-Pay:  DME: 80%     Co-Ins: 20% Providers: n/a SECONDARY:       Policy#:      Phone#:   Development worker, community:       Phone#:   The Therapist, art Information Summary" for patients in Inpatient Rehabilitation Facilities with attached "Privacy Act [employer] Records" was provided and verbally reviewed with: Family  Emergency Contact Information Contact Information     Name Relation Home Work Mobile   [place]    [phone]   Nelida Gores   [phone]       Current Medical History  Patient Admitting Diagnosis: CVA  History of Present Illness: Pt is a 65 y/o male with history of afib and ETOH abuse who was admitted to [facility] on 9/3 for L hemiplegia and R sided jerking after attempted cardioversion by EMS for afib with RVR.  On arrival to ED pt was hemiplegic on the L with rhythmic jerking on RUE.  Intubated for respiratory failure with paralytics on board.  CTH showed possible subtle loss of gray-white differentiation in R lentiform nucleus and bilateral thalami.  CTA showed R MI occlusion.  CTP showed R MCA infarct core 11 mL with penumbra of 164 mL.  Pt underwent emergency thrombectomy of R M1 occlusion.  MRI showed confluent acute infarct at the right basal ganglia, bilateral frontal peripheral infarcts, subacute L thalamic infarct, and PRES appearance in the bilateral parieto  occipital cortex.  Pt now on Eliquis starting 9/8.  Therapy evaluations were completed and pt was recommended for CIR for functional deficits and cognitive impairments.   Complete NIHSS TOTAL: 5  [facility]'s [facility] record [facility] has been reviewed by the rehabilitation admission coordinator and physician.  Past Medical History  Past Medical History:  Diagnosis Date   Anxiety    Elevated LFTs    History of colon polyps    HLD (hyperlipidemia)    HTN (hypertension)    Localized superficial swelling, mass, or lump     Has the patient had major surgery during 100 days prior to admission? No  Family History   family history includes Clotting disorder in his mother; Colon cancer in his father; Heart disease in his brother; Hepatitis in his mother; Rheumatic fever in his brother; Stroke in his mother; Thyroid disease in his mother.  Current Medications  Current Facility-Administered Medications:    0.9 %  sodium chloride infusion, , Intravenous, Continuous, Bailey-Modzik, Delila A, NP, Stopped at 03/15/21 1417   acetaminophen (TYLENOL) tablet 650  mg, 650 mg, Per Tube, Q4H PRN, 650 mg at 03/17/21 0844 **OR** acetaminophen (TYLENOL) 160 MG/5ML solution 650 mg, 650 mg, Per Tube, Q4H PRN **OR** [DISCONTINUED] acetaminophen (TYLENOL) suppository 650 mg, 650 mg, Rectal, Q4H PRN, Derek Jack, MD   atorvastatin (LIPITOR) tablet 40 mg, 40 mg, Oral, Daily, Bailey-Modzik, Delila A, NP, 40 mg at 03/17/21 0844   chlorhexidine gluconate (MEDLINE KIT) (PERIDEX) 0.12 % solution 15 mL, 15 mL, Mouth Rinse, BID, Bailey-Modzik, Delila A, NP, 15 mL at 16/10/96 0454   folic acid (FOLVITE) tablet 1 mg, 1 mg, Oral, Daily, Bailey-Modzik, Delila A, NP, 1 mg at 03/17/21 0844   heparin ADULT infusion 100 units/mL (25000 units/256m), 2,000 Units/hr, Intravenous, Continuous, Dang, Thuy D, RPH, Last Rate: 18.5 mL/hr at 03/17/21 1049, 1,850 Units/hr at 03/17/21 1049   irbesartan (AVAPRO) tablet 75 mg, 75 mg,  Oral, Daily, Bailey-Modzik, Delila A, NP, 75 mg at 03/17/21 0844   LORazepam (ATIVAN) tablet 1-4 mg, 1-4 mg, Per Tube, Q1H PRN **OR** LORazepam (ATIVAN) injection 1-4 mg, 1-4 mg, Intravenous, Q1H PRN, Bailey-Modzik, Delila A, NP, 2 mg at 03/17/21 0150   metoprolol tartrate (LOPRESSOR) tablet 100 mg, 100 mg, Oral, BID, Bailey-Modzik, Delila A, NP, 100 mg at 03/17/21 0844   multivitamin with minerals tablet 1 tablet, 1 tablet, Oral, Daily, Bailey-Modzik, Delila A, NP, 1 tablet at 03/17/21 0844   senna-docusate (Senokot-S) tablet 1 tablet, 1 tablet, Per Tube, QHS PRN, Bailey-Modzik, Delila A, NP   thiamine tablet 100 mg, 100 mg, Oral, Daily, Bailey-Modzik, Delila A, NP, 100 mg at 03/17/21 0844  Patients Current Diet:  Diet Order             DIET DYS 3 Room service appropriate? Yes; Fluid consistency: Thin  Diet effective now                   Precautions / Restrictions Precautions Precautions: Fall Precaution Comments: on continuous EEG Restrictions Weight Bearing Restrictions: No   Has the patient had 2 or more falls or a fall with injury in the past year? 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 Independent  Indoor Mobility: Did the patient need assistance with walking from room to room (with or without device)? Independent  Stairs: Did the patient need assistance with internal or external stairs (with or without device)? Independent  Functional Cognition: Did the patient need help planning regular tasks such as shopping or remembering to take medications? Independent  Patient Information Are you of Hispanic, Latino/a,or Spanish origin?: A. No, not of Hispanic, Latino/a, or Spanish origin What  is your race?: A. White Do you need or want an interpreter to communicate with a doctor or health care staff?: 0. No  Patient's Response To:  Health Literacy and Transportation Is the patient able to respond to health literacy and transportation needs?: No  Home Assistive Devices / Equipment Home Equipment: None  Prior Device Use: Indicate devices/aids used by the patient prior to current illness, exacerbation or injury? None of the above  Current Functional Level Cognition  Arousal/Alertness: Awake/alert Overall Cognitive Status: Impaired/Different from baseline Current Attention Level: Focused Orientation Level: Oriented to person, Oriented to place, Oriented to situation, Disoriented to time Following  Commands: Follows one step commands with increased time, Follows one step commands inconsistently Safety/Judgement: Decreased awareness of safety, Decreased awareness of deficits (impulsive) General Comments: pt needs 1-step cues for safety with each mobility task; pt impulsive to get out of chair but decreased short term memory from discussion with PTA 90 mins earlier that he needs to press call bell. Pt received in chair trying to remove Posey lap alarm. Attention: Focused, Sustained Focused Attention: Impaired Focused Attention Impairment: Functional basic Sustained Attention: Impaired Sustained Attention Impairment: Functional basic Memory: Impaired Memory Impairment: Storage deficit Awareness: Impaired Awareness Impairment: Intellectual impairment, Emergent impairment, Anticipatory impairment Problem Solving: Impaired Problem Solving Impairment: Functional basic Executive Function: Self Monitoring, Self Correcting Self Monitoring: Impaired Self Monitoring Impairment: Functional basic Behaviors: Restless Safety/Judgment: Impaired    Extremity Assessment (includes Sensation/Coordination)  Upper Extremity Assessment: LUE deficits/detail RUE Deficits / Details: pt using R UE  purposefully to reach for vent tube LUE Deficits / Details: LUE generalized weakness; moving out of synergy pattern; AAROM to @ 100 FF; unablet o hold @ 90; gross grasp/release; not attempting to use functionally LUE Sensation: decreased light touch LUE Coordination: decreased fine motor, decreased gross motor  Lower Extremity Assessment: Defer to PT evaluation RLE Deficits / Details: pt attempted to kick and wiggle toes to command LLE Deficits / Details: no active movement, appearead to have increased tone when attempting to perform PROM    ADLs  Overall ADL's : Needs assistance/impaired Eating/Feeding: NPO Eating/Feeding Details (indicate cue type and reason): ice chips/water; awaiting swallow eval Grooming: Moderate assistance Upper Body Bathing: Moderate assistance Lower Body Bathing: Maximal assistance, Bed level Upper Body Dressing : Moderate assistance Upper Body Dressing Details (indicate cue type and reason): pt attempting to help however missing armholes Lower Body Dressing: Total assistance Toileting- Clothing Manipulation and Hygiene: Total assistance Toileting - Clothing Manipulation Details (indicate cue type and reason): incontinenet of BM; Condom cath fell/pulled off Functional mobility during ADLs:  (will need +2)    Mobility  Overal bed mobility: Needs Assistance Bed Mobility: Rolling, Sit to Sidelying Rolling: Min assist Supine to sit: Mod assist, +2 for physical assistance Sit to sidelying: Min assist, +2 for physical assistance General bed mobility comments: increased time to process 1-step cues for reverse log roll and needs minA to get each LE over EOB; flat HOB, minA to rotate hips to return supine; pt able to pull himself up toward [facility] with hand over hand cues and minA.    Transfers  Overall transfer level: Needs assistance Equipment used: 2 person hand held assist Transfers: Sit to/from Stand, Stand Pivot Transfers Sit to Stand: Mod assist, +2 physical  assistance Stand pivot transfers: +2 physical assistance, Mod assist General transfer comment: tactile cues to power up, modA x2 to steady, pt with posterior lean, pt with increased lean in afternoon and more difficulty picking feet up, needs +2 modA for return transfer to EOB; also performed seated scooting toward [facility] with mod cues and +2 min/modA via bed pad    Ambulation / Gait / Stairs / Wheelchair Mobility  Ambulation/Gait Ambulation/Gait assistance: Mod assist, +2 physical assistance Gait Distance (Feet): 5 Feet (pre-gait marching x10 reps, seated break, then 31f from chair>bed.) Assistive device: 2 person hand held assist Gait Pattern/deviations: Step-to pattern, Leaning posteriorly, Shuffle, Decreased step length - right, Decreased step length - left General Gait Details: downward gaze, pt able to perform forward/retro steps and standing hip flexion. Pt took seated break then lateral steps toward bed (bed moved further  away prior to transfer to see if he could progress distance), pt needed to sit after ~52f. Increased time to perform; may be able to trial RW next session to see if this helps posture Gait velocity: grossly <0.4 m/s    Posture / Balance Dynamic Sitting Balance Sitting balance - Comments: R bias in supported sitting Balance Overall balance assessment: Needs assistance Sitting-balance support: Feet supported, Bilateral upper extremity supported Sitting balance-Leahy Scale: Poor Sitting balance - Comments: R bias in supported sitting Standing balance support: Bilateral upper extremity supported, During functional activity Standing balance-Leahy Scale: Poor Standing balance comment: dependent on physical assist +1-2    Special needs/care consideration N/a   Previous Home Environment (from acute therapy documentation) Living Arrangements: Children (son, dtr-in-law and grandchild) Available Help at Discharge: Family, Available PRN/intermittently Type of Home: House Home  Layout: One level Home Access: Stairs to enter Entrance Stairs-Rails: Left Entrance Stairs-Number of Steps: 1 Bathroom Shower/Tub: TChiropodist Standard Additional Comments: need to confirm; pt unreliable  Discharge Living Setting Plans for Discharge Living Setting: Lives with (comment) (son, his gf, and 3 y/o grandchild) Type of Home at Discharge: House Discharge Home Layout: One level Discharge Home Access: Stairs to enter Entrance Stairs-Rails: Left Entrance Stairs-Number of Steps: 1 Discharge Bathroom Shower/Tub: Tub/shower unit Discharge Bathroom Toilet: Standard Discharge Bathroom Accessibility: No Does the patient have any problems obtaining your medications?: No  Social/Family/Support Systems Anticipated Caregiver: son, DShanon Brow and his gf Anticipated Caregiver's Contact Information: DShanon [phone]/Limitations of Caregiver: works days, but gf can provide supervision Caregiver Availability: 24/7 Discharge Plan Discussed with Primary Caregiver: Yes Is Caregiver In Agreement with Plan?: Yes Does Caregiver/Family have Issues with Lodging/Transportation while Pt is in Rehab?: No  Goals  Supervision to min assist with PT and OT, Supervision with SLP ELOS 18-22 days  Decrease burden of Care through IP rehab admission: n/a  Possible need for SNF placement upon discharge: not anticipated  Patient Condition: [facility]  CM/CSW , and patient and son. I met with patient at the bedside and discussed via phone for inpatient rehabilitation assessment.  Patient will benefit from ongoing PT, OT, and SLP, can actively participate in 3 hours of therapy a day 5 days of the week, and can make measurable gains during the admission.  Patient will also benefit from the coordinated team approach during an Inpatient Acute Rehabilitation admission.  The patient will receive intensive therapy as well as Rehabilitation  physician, nursing, social worker, and care management interventions.  Due to safety, disease management, medication administration, pain management, and patient education the patient requires 24 hour a day rehabilitation nursing.  The patient is currently mod +2 with mobility and basic ADLs.  Discharge setting and therapy post discharge at home with home health is anticipated.  Patient has agreed to participate in the Acute Inpatient Rehabilitation Program and will admit today.  Preadmission Screen Completed By:  CMichel Santee PT, DPT 03/17/2021 11:51 AM ______________________________________________________________________   Discussed status with Dr. SNaaman Plummeron 03/17/21  at 12:06 PM   and received approval for admission today.  Admission Coordinator:  CMichel Santee PT, DPT time 12:06 PM /Sudie Grumbling09/08/22    Assessment/Plan: Diagnosis: CVA PRES Does the need for close, 24 hr/day Medical supervision in concert with the patient's rehab needs make it unreasonable for this patient to be served in a less intensive setting? Yes Co-Morbidities requiring supervision/potential complications: HTN, etoh abuse, afib Due to bladder management,  bowel management, safety, skin/wound care, disease management, medication administration, pain management, and patient education, does the patient require 24 hr/day rehab nursing? Yes Does the patient require coordinated care of a physician, rehab nurse, PT, OT, and SLP to address physical and functional deficits in the context of the above medical diagnosis(es)? Yes Addressing deficits in the following areas: balance, endurance, locomotion, strength, transferring, bowel/bladder control, bathing, dressing, feeding, grooming, toileting, cognition, speech, language, swallowing, and psychosocial support Can the patient actively participate in an intensive therapy program of at least 3 hrs of therapy 5 days a week? Yes The potential for patient to make measurable gains while  on inpatient rehab is excellent Anticipated functional outcomes upon discharge from inpatient rehab: supervision and min assist PT, supervision and min assist OT, supervision SLP Estimated rehab length of stay to reach the above functional goals is: 18-22 days Anticipated discharge destination: Home 10. Overall Rehab/Functional Prognosis: good   MD Signature: Meredith Staggers, MD, [facility] 03/17/2021

## 2021-03-17 NOTE — Progress Notes (Signed)
Occupational Therapy Treatment Patient Details Name: Patrick Padilla MRN: [id number] DOB: 03-07-1956 Today's Date: 03/17/2021    History of present illness 65 yo patient who presented to ED with L hemiplegia and R sided jerking after attempted cardioversion by EMS for afib with RVR.  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 Few and  punctate bifrontal peripheral infarcts. Recent medial left thalamic infarct which may be subacute. 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 Extubated 9/5. PMH:  hx EtOH abuse and  afib (unclear if on [facility]), anxiety HTN PSH: cardioversion, L THA   OT comments  Pt with fair participation, limited somewhat by fatigue and decreased activity tolerance. Agreeable to trial LUE there.act, self care retraining at bed level. Noted persisted R lateral lean without effort for correction, pillows placed to facilitate midline alignment but persistent R cervical rotation throughout. Able to scan and follow therapist to L, but fluctuating reports of visual attention and ability, what appears as ?slight field cut but unable to determine if it wasn't more inattention, would benefit from further trial and errors d/t pts decreased insight and report. Pt requiring mod A overall for grooming, min-mod A for repositioning at bed level. Discussed status with RN at end of session, pt continues to be good candidate for IPR at time of d/c   Follow Up Recommendations  CIR    Equipment Recommendations  3 in 1 bedside commode    Recommendations for Other Services Rehab consult    Precautions / Restrictions Precautions Precautions: Fall Precaution Comments: impulsive Restrictions Weight Bearing Restrictions: No       Mobility Bed Mobility     Rolling: Min assist         General bed mobility comments: increased time for single step commands, with need at times for hand over hand to initate action towards L side.     Transfers                      Balance                                           ADL either performed or assessed with clinical judgement   ADL   Eating/Feeding: Set up;Minimal assistance Eating/Feeding Details (indicate cue type and reason): increasd spillage, decreased attention to L side of tray/plate altogether Grooming: Moderate assistance;Bed level Grooming Details (indicate cue type and reason): washing hands, face, oral care and washing/combing hair while upright ~90 degrees in bed                               General ADL Comments: Pt with fair indep sequencing of tasks, attention to detail poor and thoroughness impaired.     Vision Patient Visual Report: Other (comment) (difficulty with reporting, endorses wearing glasses for reading at times) Vision Assessment?: No apparent visual deficits Additional Comments: Brief visual assessment completed during session d/t persistent inattention and lack of awareness of L environment. Inconsistent outcomes noted, difficulty with lateral scanning to L>R, at times appears with ?slight field cut vs inattention. would benefit form additional assessment at next session pending alerteness   Perception     Praxis      Cognition Arousal/Alertness: Lethargic Behavior During Therapy: Impulsive Overall Cognitive Status: Impaired/Different from baseline Area of Impairment: Orientation;Attention;Memory;Following commands;Safety/judgement;Awareness  Orientation Level: Disoriented to;Time;Situation Current Attention Level: Focused Memory: Decreased short-term memory;Decreased recall of precautions Following Commands: Follows one step commands with increased time;Follows one step commands inconsistently Safety/Judgement: Decreased awareness of safety;Decreased awareness of deficits Awareness: Emergent;Intellectual Problem Solving: Slow processing;Decreased initiation;Difficulty  sequencing;Requires verbal cues;Requires tactile cues General Comments: increased time for all processing of single step directions, benefiting this date from visual demo and assist at time for intiation of task.        Exercises Exercises: Shoulder Shoulder Exercises Shoulder Flexion: PROM;AAROM;Left;10 reps;Seated Shoulder ABduction: PROM;AAROM;Left;10 reps Elbow Flexion: PROM;AAROM;Left;10 reps Elbow Extension: PROM;AAROM;Left;10 reps Wrist Flexion: PROM;AAROM;Left;10 reps Wrist Extension: PROM;AAROM;Left;10 reps   Shoulder Instructions       General Comments VSS on RA [facility]    Pertinent Vitals/ Pain       Pain Assessment: No/denies pain Pain Intervention(s): Limited activity within patient's tolerance  Home Living                                          Prior Functioning/Environment              Frequency  Min 2X/week        Progress Toward Goals  OT Goals(current goals can now be found in the care plan section)     Acute Rehab OT Goals Patient Stated Goal: to move better  Plan      Co-evaluation                 AM-PAC OT "6 Clicks" Daily Activity     Outcome Measure   Help from another person eating meals?: A Lot Help from another person taking care of personal grooming?: A Lot Help from another person toileting, which includes using toliet, bedpan, or urinal?: Total Help from another person bathing (including washing, rinsing, drying)?: A Lot Help from another person to put on and taking off regular upper body clothing?: A Lot Help from another person to put on and taking off regular lower body clothing?: A Lot 6 Click Score: 11    End of Session    OT Visit Diagnosis: Other abnormalities of gait and mobility (R26.89);Muscle weakness (generalized) (M62.81);Other symptoms and signs involving cognitive function;Hemiplegia and hemiparesis Hemiplegia - Right/Left: Left Hemiplegia - dominant/non-dominant:  Non-Dominant Hemiplegia - caused by: Cerebral infarction   Activity Tolerance Patient limited by fatigue   Patient Left in bed;with call bell/phone within reach;with bed alarm set;with nursing/sitter in room   Nurse Communication Mobility status        Time: 3500-9381 OT Time Calculation (min): 24 min  Charges: OT General Charges $OT Visit: 1 Visit OT Treatments $Self Care/Home Management : 23-37 mins  Evelyna Folker OTR/L acute rehab services Office: [phone]  03/17/2021, 12:57 PM

## 2021-03-17 NOTE — Plan of Care (Signed)
Problem: Education: Goal: Knowledge of disease or condition will improve 03/17/2021 1329 by Patrick Padilla, Patrick Livings, Patrick Padilla Outcome: Adequate for Discharge 03/17/2021 1115 by Patrick Lathe, Patrick Padilla Outcome: Progressing Goal: Knowledge of secondary prevention will improve 03/17/2021 1329 by Patrick Padilla, Patrick Livings, Patrick Padilla Outcome: Adequate for Discharge 03/17/2021 1115 by Patrick Lathe, Patrick Padilla Outcome: Progressing Goal: Knowledge of patient specific risk factors addressed and post discharge goals established will improve 03/17/2021 1329 by Patrick Padilla, Patrick Livings, Patrick Padilla Outcome: Adequate for Discharge 03/17/2021 1115 by Patrick Lathe, Patrick Padilla Outcome: Progressing Goal: Individualized Educational Video(s) 03/17/2021 1329 by Patrick Lathe, Patrick Padilla Outcome: Adequate for Discharge 03/17/2021 1115 by Patrick Padilla, Patrick Livings, Patrick Padilla Outcome: Progressing   Problem: Coping: Goal: Will verbalize positive feelings about self 03/17/2021 1329 by Patrick Padilla, Patrick Livings, Patrick Padilla Outcome: Adequate for Discharge 03/17/2021 1115 by Patrick Lathe, Patrick Padilla Outcome: Progressing Goal: Will identify appropriate support needs 03/17/2021 1329 by Patrick Padilla, Patrick Livings, Patrick Padilla Outcome: Adequate for Discharge 03/17/2021 1115 by Patrick Padilla, Patrick Livings, Patrick Padilla Outcome: Progressing   Problem: Health Behavior/Discharge Planning: Goal: Ability to manage health-related needs will improve 03/17/2021 1329 by Patrick Bohorquez, Patrick Livings, Patrick Padilla Outcome: Adequate for Discharge 03/17/2021 1115 by Goebel Hellums, Patrick Livings, Patrick Padilla Outcome: Progressing   Problem: Self-Care: Goal: Ability to participate in self-care as condition permits will improve 03/17/2021 1329 by Katrenia Alkins, Patrick Livings, Patrick Padilla Outcome: Adequate for Discharge 03/17/2021 1115 by Patrick Lathe, Patrick Padilla Outcome: Progressing Goal: Verbalization of feelings and concerns over difficulty with self-care will improve 03/17/2021 1329 by Khiyan Crace, Patrick Livings, Patrick Padilla Outcome: Adequate for Discharge 03/17/2021 1115 by Patrick Lathe, Patrick Padilla Outcome: Progressing Goal: Ability  to communicate needs accurately will improve 03/17/2021 1329 by Moataz Tavis, Patrick Livings, Patrick Padilla Outcome: Adequate for Discharge 03/17/2021 1115 by Patrick Lathe, Patrick Padilla Outcome: Progressing   Problem: Nutrition: Goal: Risk of aspiration will decrease 03/17/2021 1329 by Tahj Lindseth, Patrick Livings, Patrick Padilla Outcome: Adequate for Discharge 03/17/2021 1115 by Patrick Lathe, Patrick Padilla Outcome: Progressing Goal: Dietary intake will improve 03/17/2021 1329 by Copeland Lapier, Patrick Livings, Patrick Padilla Outcome: Adequate for Discharge 03/17/2021 1115 by Vangie Henthorn, Patrick Livings, Patrick Padilla Outcome: Progressing   Problem: Ischemic Stroke/TIA Tissue Perfusion: Goal: Complications of ischemic stroke/TIA will be minimized 03/17/2021 1329 by Floye Fesler, Patrick Livings, Patrick Padilla Outcome: Adequate for Discharge 03/17/2021 1115 by Katlynn Naser, Patrick Livings, Patrick Padilla Outcome: Progressing   Problem: Education: Goal: Knowledge of disease or condition will improve 03/17/2021 1329 by Atleigh Gruen, Patrick Livings, Patrick Padilla Outcome: Adequate for Discharge 03/17/2021 1115 by Patrick Lathe, Patrick Padilla Outcome: Progressing Goal: Understanding of discharge needs will improve 03/17/2021 1329 by Azahel Belcastro, Patrick Livings, Patrick Padilla Outcome: Adequate for Discharge 03/17/2021 1115 by Sabrine Patchen, Patrick Livings, Patrick Padilla Outcome: Progressing   Problem: Health Behavior/Discharge Planning: Goal: Ability to identify changes in lifestyle to reduce recurrence of condition will improve 03/17/2021 1329 by Zephyr Sausedo, Patrick Livings, Patrick Padilla Outcome: Adequate for Discharge 03/17/2021 1115 by Patrick Lathe, Patrick Padilla Outcome: Progressing Goal: Identification of resources available to assist in meeting health care needs will improve 03/17/2021 1329 by Willena Jeancharles, Patrick Livings, Patrick Padilla Outcome: Adequate for Discharge 03/17/2021 1115 by Patrick Lathe, Patrick Padilla Outcome: Progressing   Problem: Physical Regulation: Goal: Complications related to the disease process, condition or treatment will be avoided or minimized 03/17/2021 1329 by Kaylan Yates, Patrick Livings, Patrick Padilla Outcome: Adequate for Discharge 03/17/2021  1115 by Ali Mclaurin, Patrick Livings, Patrick Padilla Outcome: Progressing   Problem: Safety: Goal: Ability to remain free from injury will improve 03/17/2021 1329 by Braleigh Massoud, Patrick Livings, Patrick Padilla Outcome: Adequate for Discharge 03/17/2021 1115 by Patrick Lathe, Patrick Padilla Outcome: Progressing

## 2021-03-17 NOTE — Progress Notes (Signed)
Inpatient Rehabilitation Medication Review by a Pharmacist  A complete drug regimen review was completed for this patient to identify any potential clinically significant medication issues.  High Risk Drug Classes Is patient taking? Indication by Medication  Antipsychotic Yes Compazine PRN for nausea  Anticoagulant Yes Apixaban for afib  Antibiotic No   Opioid No   Antiplatelet No   Hypoglycemics/insulin No   Vasoactive Medication Yes Irbesartan, metoprolol for HTN/afib  Chemotherapy No   Other Yes Statin for CVA     Type of Medication Issue Identified Description of Issue Recommendation(s)  Drug Interaction(s) (clinically significant)     Duplicate Therapy     Allergy     No Medication Administration End Date     Incorrect Dose     Additional Drug Therapy Needed  Naproxen and Senna/docusate on d/c summary Evaluate for continuation  Significant med changes from prior encounter (inform family/care partners about these prior to discharge).    Other       Clinically significant medication issues were identified that warrant physician communication and completion of prescribed/recommended actions by midnight of the next day:  No  Name of provider notified for urgent issues identified:   Provider Method of Notification:     Pharmacist comments:   Time spent performing this drug regimen review (minutes):  20  Farron Watrous A. Jeanella Craze, PharmD, BCPS, FNKF Clinical Pharmacist [place] Please utilize Amion for appropriate phone number to reach the unit pharmacist [facility])  03/17/2021 5:37 PM

## 2021-03-17 NOTE — H&P (Signed)
Physical Medicine and Rehabilitation Admission H&P    CC: Stroke with functional deficits   HPI: Patrick Padilla is a 65 year old RH-male with history of A fib s/p CV in the past, ETOH abuse who activated EMS with reports of confusion, CP and diaphoresis, was found to be in A fib with RVR and CV attempts enroute to ED. He was hemiplegic with RUE jerking moments, was obtundent with hypoxic respiratory failure requiring intubation in ED. CTP showed R-MCA infarct with 11 mm core and 165 ml penumbra. He underwent emergent cerebral angio with thrombectomy  R-M1/MCA occlusion with complete recanalization by Dr. Karenann Cai. EEG done negative for epileptiform activity. He was placed on Cleveprex for BP management and started on Keppra due to concerns of seizure. LTM-EEG showed diffuse encephalopathy without seizure or epileptiform discharges and he tolerated extubation by 09/05. Mentation has been improving with increase in movement on the left and has had bouts of agitation felt to be due to ETOH withdrawal therefore started on CIWA protocol.  Dr. Erlinda Hong felt that stroke was embolic due to A fib s/p cardioversion and follow up MRI head showed acute infarct in right basal ganglia, few punctate bifrontal peripheral infarcts and recent left thalamic infarct question subacute as well as PRES appearance in bilateral parieto-occipital cortex and small volume blood products at right sylvian fissure. He was started on ASA due to moderate size stroke with petechial hemorrhage. He has had bouts of A fib and started on IV heparin and transitioned to Eliquis. He did have rise in WBC to 20.6 with 1/2 BC positive for staph epi felt to be contaminant and leucocytosis has resolved, AKI with oliguria without signs of retention--SCr 1.37-->0.76.  He also reported fall with left shoulder pain and Xrays of left shoulder negative for Fx.  He continues to be limited by left sided weakness with fatigue, mild left inattention,  cognitive deficits,  and lack of awareness of deficits. CIR recommended due to functional decline.  .   Review of Systems  Unable to perform ROS: Mental acuity    Past Medical History:  Diagnosis Date   Anxiety    Elevated LFTs    History of colon polyps    HLD (hyperlipidemia)    HTN (hypertension)    Localized superficial swelling, mass, or lump     Past Surgical History:  Procedure Laterality Date   CARDIOVERSION N/A 02/05/2019   Procedure: CARDIOVERSION;  Surgeon: Josue Hector, MD;  Location: [place];  Service: Cardiovascular;  Laterality: N/A;   CERVICAL DISC SURGERY     IR CT HEAD LTD  03/12/2021   IR PERCUTANEOUS ART THROMBECTOMY/INFUSION INTRACRANIAL INC DIAG ANGIO  03/12/2021   IR US GUIDE VASC ACCESS RIGHT  03/15/2021   LEG SURGERY     right leg and ankle   NECK SURGERY     cyst removed   RADIOLOGY WITH ANESTHESIA N/A 03/12/2021   Procedure: RADIOLOGY WITH ANESTHESIA;  Surgeon: Radiologist, Medication, MD;  Location: [place];  Service: Radiology;  Laterality: N/A;   TEE WITHOUT CARDIOVERSION N/A 02/05/2019   Procedure: TRANSESOPHAGEAL ECHOCARDIOGRAM (TEE);  Surgeon: Josue Hector, MD;  Location: [facility];  Service: Cardiovascular;  Laterality: N/A;   TOTAL HIP ARTHROPLASTY     left    Family History  Problem Relation Age of Onset   Hepatitis Mother    Clotting disorder Mother    Stroke Mother    Thyroid disease Mother    Colon cancer Father        ?  Rheumatic fever Brother    Heart disease Brother     Social History: Single. Mechanic--travels and services fabrication equipment. Lives with family. He reports that he has been smoking--2 PPD.  He has never used smokeless tobacco. He drinks one quat of whiskey daily per reports.  He reports that he does not use drugs.   Allergies: No Known Allergies   Medications Prior to Admission  Medication Sig Dispense Refill   metoprolol tartrate (LOPRESSOR) 100 MG tablet Take 1 tablet by mouth twice daily 180  tablet 2   naproxen sodium (ALEVE) 220 MG tablet Take 220 mg by mouth daily as needed (pain/headache). As needed     PARoxetine (PAXIL) 20 MG tablet Take 20 mg by mouth daily.     telmisartan (MICARDIS) 40 MG tablet Take 40 mg by mouth at bedtime.     ELIQUIS 5 MG TABS tablet TAKE 1 TABLET BY MOUTH TWICE DAILY 180 tablet 1   naproxen (NAPROSYN) 500 MG tablet Take 500 mg by mouth 2 (two) times daily as needed for mild pain.      Drug Regimen Review  Drug regimen was reviewed and remains appropriate with no significant issues identified  Home: Home Living Family/patient expects to be discharged to:: Private residence Living Arrangements: Children (son, dtr-in-law and grandchild) Available Help at Discharge: Family, Available PRN/intermittently Type of Home: House Home Access: Stairs to enter Technical brewer of Steps: 1 Entrance Stairs-Rails: Left Home Layout: One level Bathroom Shower/Tub: Chiropodist: Standard Home Equipment: None Additional Comments: need to confirm; pt unreliable   Functional History: Prior Function Level of Independence: Independent Comments: per son pt was sedentary and spent most of his days in a recliner however was independent with mobility and ADL  Functional Status:  Mobility: Bed Mobility Overal bed mobility: Needs Assistance Bed Mobility: Rolling, Sit to Sidelying Rolling: Min assist Supine to sit: Mod assist, +2 for physical assistance Sit to sidelying: Min assist, +2 for physical assistance General bed mobility comments: increased time to process 1-step cues for reverse log roll and needs minA to get each LE over EOB; flat HOB, minA to rotate hips to return supine; pt able to pull himself up toward [facility] with hand over hand cues and minA. Transfers Overall transfer level: Needs assistance Equipment used: 2 person hand held assist Transfers: Sit to/from Stand, Stand Pivot Transfers Sit to Stand: Mod assist, +2 physical  assistance Stand pivot transfers: +2 physical assistance, Mod assist General transfer comment: tactile cues to power up, modA x2 to steady, pt with posterior lean, pt with increased lean in afternoon and more difficulty picking feet up, needs +2 modA for return transfer to EOB; also performed seated scooting toward [facility] with mod cues and +2 min/modA via bed pad Ambulation/Gait Ambulation/Gait assistance: Mod assist, +2 physical assistance Gait Distance (Feet): 5 Feet (pre-gait marching x10 reps, seated break, then 82f from chair>bed.) Assistive device: 2 person hand held assist Gait Pattern/deviations: Step-to pattern, Leaning posteriorly, Shuffle, Decreased step length - right, Decreased step length - left General Gait Details: downward gaze, pt able to perform forward/retro steps and standing hip flexion. Pt took seated break then lateral steps toward bed (bed moved further away prior to transfer to see if he could progress distance), pt needed to sit after ~567f Increased time to perform; may be able to trial RW next session to see if this helps posture Gait velocity: grossly <0.4 m/s    ADL: ADL Overall ADL's : Needs assistance/impaired Eating/Feeding: NPO  Eating/Feeding Details (indicate cue type and reason): ice chips/water; awaiting swallow eval Grooming: Moderate assistance Upper Body Bathing: Moderate assistance Lower Body Bathing: Maximal assistance, Bed level Upper Body Dressing : Moderate assistance Upper Body Dressing Details (indicate cue type and reason): pt attempting to help however missing armholes Lower Body Dressing: Total assistance Toileting- Clothing Manipulation and Hygiene: Total assistance Toileting - Clothing Manipulation Details (indicate cue type and reason): incontinenet of BM; Condom cath fell/pulled off Functional mobility during ADLs:  (will need +2)  Cognition: Cognition Overall Cognitive Status: Impaired/Different from baseline Arousal/Alertness:  Awake/alert Orientation Level: Oriented to person, Oriented to place Year: Other (Comment) Month:  (2003) Day of Week: Incorrect Attention: Focused, Sustained Focused Attention: Impaired Focused Attention Impairment: Functional basic Sustained Attention: Impaired Sustained Attention Impairment: Functional basic Memory: Impaired Memory Impairment: Storage deficit Awareness: Impaired Awareness Impairment: Intellectual impairment, Emergent impairment, Anticipatory impairment Problem Solving: Impaired Problem Solving Impairment: Functional basic Executive Function: Self Monitoring, Self Correcting Self Monitoring: Impaired Self Monitoring Impairment: Functional basic Behaviors: Restless Safety/Judgment: Impaired Cognition Arousal/Alertness: Awake/alert Behavior During Therapy: Impulsive Overall Cognitive Status: Impaired/Different from baseline Area of Impairment: Orientation, Attention, Memory, Following commands, Safety/judgement, Awareness, Problem solving Orientation Level: Disoriented to, Time (stated hospital and stroke) Current Attention Level: Focused Memory: Decreased short-term memory, Decreased recall of precautions Following Commands: Follows one step commands with increased time, Follows one step commands inconsistently Safety/Judgement: Decreased awareness of safety, Decreased awareness of deficits (impulsive) Awareness: Emergent, Intellectual Problem Solving: Slow processing, Decreased initiation, Difficulty sequencing, Requires verbal cues, Requires tactile cues General Comments: pt needs 1-step cues for safety with each mobility task; pt impulsive to get out of chair but decreased short term memory from discussion with PTA 90 mins earlier that he needs to press call bell. Pt received in chair trying to remove Posey lap alarm.   Blood pressure (!) 161/98, pulse 88, temperature 98.8 F (37.1 C), temperature source Oral, resp. rate 18, height 6' (1.829 m), weight 90 kg,  SpO2 97 %. Physical Exam Vitals and nursing note reviewed.  Constitutional:      General: He is not in acute distress.    Appearance: Normal appearance.     Comments: Had difficulty staying awake. Audible wheezing at times. Attempted to get out of bed to urinate (has condom cath)--lacks safety awareness.   HENT:     Head: Normocephalic and atraumatic.     Nose: Nose normal.     Mouth/Throat:     Mouth: Mucous membranes are moist.     Comments: Poor dentition. Missing most of teeth Eyes:     Extraocular Movements: Extraocular movements intact.     Conjunctiva/sclera: Conjunctivae normal.  Cardiovascular:     Rate and Rhythm: Normal rate and regular rhythm.     Heart sounds: No murmur heard.   No gallop.  Pulmonary:     Effort: Pulmonary effort is normal. No respiratory distress.     Breath sounds: No wheezing or rales.  Abdominal:     General: Abdomen is flat. Bowel sounds are normal.     Palpations: Abdomen is soft.  Musculoskeletal:     Cervical back: Normal range of motion.     Comments: Right knee effusion and pain with palpation/ROM attemtps  Skin:    General: Skin is warm.  Neurological:     Mental Status: He is alert.     Comments: HOH. Oriented to self only[place] Delayed processing with internal distraction and had difficulty answering simple questions--age " pushing 60"  Month "August/December' Moves RUE 5/5. RLE limited dt knee pain. LUE 2+ to 3/5 prox to distal. LLE 2 to 2+/5 prox to distal. Senses pain and light touch in all 4.   Psychiatric:     Comments: Flat, somewhat cooperative    Results for orders placed or performed during the hospital encounter of 03/12/21 (from the past 48 hour(s))  APTT     Status: Abnormal   Collection Time: 03/15/21  6:10 PM  Result Value Ref Range   aPTT 37 (H) 24 - 36 seconds    Comment:        IF BASELINE aPTT IS ELEVATED, SUGGEST PATIENT RISK ASSESSMENT BE USED TO DETERMINE APPROPRIATE ANTICOAGULANT  THERAPY. Performed at [facility], [place] [address], [place], [place] [address]   Glucose, capillary     Status: Abnormal   Collection Time: 03/15/21  8:23 PM  Result Value Ref Range   Glucose-Capillary 116 (H) 70 - 99 mg/dL    Comment: Glucose reference range applies only to samples taken after fasting for at least 8 hours.  Glucose, capillary     Status: Abnormal   Collection Time: 03/16/21 12:04 AM  Result Value Ref Range   Glucose-Capillary 105 (H) 70 - 99 mg/dL    Comment: Glucose reference range applies only to samples taken after fasting for at least 8 hours.  Basic metabolic panel     Status: Abnormal   Collection Time: 03/16/21  2:46 AM  Result Value Ref Range   Sodium 137 135 - 145 mmol/L   Potassium 3.6 3.5 - 5.1 mmol/L   Chloride 104 98 - 111 mmol/L   CO2 22 22 - 32 mmol/L   Glucose, Bld 93 70 - 99 mg/dL    Comment: Glucose reference range applies only to samples taken after fasting for at least 8 hours.   BUN 11 8 - 23 mg/dL   Creatinine, Ser 0.81 0.61 - 1.24 mg/dL   Calcium 8.6 (L) 8.9 - 10.3 mg/dL   GFR, Estimated >60 >60 mL/min    Comment: (NOTE) Calculated using the CKD-EPI Creatinine Equation (2021)    Anion gap 11 5 - 15    Comment: Performed at [facility] [address], [place], [place] [address]  Magnesium     Status: None   Collection Time: 03/16/21  2:46 AM  Result Value Ref Range   Magnesium 1.8 1.7 - 2.4 mg/dL    Comment: Performed at [facility] [address] [place], [place] [address]  CBC     Status: Abnormal   Collection Time: 03/16/21  2:46 AM  Result Value Ref Range   WBC 10.1 4.0 - 10.5 K/uL   RBC 4.43 4.22 - 5.81 MIL/uL   Hemoglobin 15.2 13.0 - 17.0 g/dL   HCT 45.0 39.0 - 52.0 %   MCV 101.6 (H) 80.0 - 100.0 fL   MCH 34.3 (H) 26.0 - 34.0 pg   MCHC 33.8 30.0 - 36.0 g/dL   RDW 13.0 11.5 - 15.5 %   Platelets 181 150 - 400 K/uL   nRBC 0.0 0.0 - 0.2 %    Comment: Performed at [facility], [place] [address]  [place], [place] [address]  Heparin level (unfractionated)     Status: Abnormal   Collection Time: 03/16/21  2:46 AM  Result Value Ref Range   Heparin Unfractionated <0.10 (L) 0.30 - 0.70 IU/mL    Comment: (NOTE) The clinical reportable range upper limit is  being lowered to >1.10 to align with the FDA approved guidance for the current laboratory assay.  If heparin results are below expected values, and patient dosage has  been confirmed, suggest follow up testing of antithrombin III levels. Performed at [facility], [place] [address] [place], [place] [address]   APTT     Status: None   Collection Time: 03/16/21  2:46 AM  Result Value Ref Range   aPTT 33 24 - 36 seconds    Comment: Performed at [facility] [address] [place], [place] [address]  Glucose, capillary     Status: None   Collection Time: 03/16/21  3:43 AM  Result Value Ref Range   Glucose-Capillary 91 70 - 99 mg/dL    Comment: Glucose reference range applies only to samples taken after fasting for at least 8 hours.  Glucose, capillary     Status: None   Collection Time: 03/16/21  8:01 AM  Result Value Ref Range   Glucose-Capillary 89 70 - 99 mg/dL    Comment: Glucose reference range applies only to samples taken after fasting for at least 8 hours.   Comment 1 Notify RN    Comment 2 Document in Chart   Heparin level (unfractionated)     Status: Abnormal   Collection Time: 03/16/21 11:06 AM  Result Value Ref Range   Heparin Unfractionated <0.10 (L) 0.30 - 0.70 IU/mL    Comment: (NOTE) The clinical reportable range upper limit is being lowered to >1.10 to align with the FDA approved guidance for the current laboratory assay.  If heparin results are below expected values, and patient dosage has  been confirmed, suggest follow up testing of antithrombin III levels. Performed at [facility], [place] [address] [place], [place] [address]   Glucose, capillary     Status: Abnormal   Collection Time:  03/16/21 12:04 PM  Result Value Ref Range   Glucose-Capillary 114 (H) 70 - 99 mg/dL    Comment: Glucose reference range applies only to samples taken after fasting for at least 8 hours.   Comment 1 Notify RN    Comment 2 Document in Chart   Glucose, capillary     Status: Abnormal   Collection Time: 03/16/21  4:43 PM  Result Value Ref Range   Glucose-Capillary 117 (H) 70 - 99 mg/dL    Comment: Glucose reference range applies only to samples taken after fasting for at least 8 hours.   Comment 1 Notify RN    Comment 2 Document in Chart   Heparin level (unfractionated)     Status: Abnormal   Collection Time: 03/16/21  6:56 PM  Result Value Ref Range   Heparin Unfractionated 0.13 (L) 0.30 - 0.70 IU/mL    Comment: (NOTE) The clinical reportable range upper limit is being lowered to >1.10 to align with the FDA approved guidance for the current laboratory assay.  If heparin results are below expected values, and patient dosage has  been confirmed, suggest follow up testing of antithrombin III levels. Performed at [facility], [place] [address], [place], [place] [address]   Basic metabolic panel     Status: Abnormal   Collection Time: 03/17/21 12:18 AM  Result Value Ref Range   Sodium 138 135 - 145 mmol/L   Potassium 3.5 3.5 - 5.1 mmol/L   Chloride 105 98 - 111 mmol/L   CO2 25 22 - 32 mmol/L   Glucose, Bld 99 70 - 99 mg/dL  Comment: Glucose reference range applies only to samples taken after fasting for at least 8 hours.   BUN 9 8 - 23 mg/dL   Creatinine, Ser 0.76 0.61 - 1.24 mg/dL   Calcium 8.7 (L) 8.9 - 10.3 mg/dL   GFR, Estimated >60 >60 mL/min    Comment: (NOTE) Calculated using the CKD-EPI Creatinine Equation (2021)    Anion gap 8 5 - 15    Comment: Performed at [facility] [address] [place], [place] [address]  Magnesium     Status: None   Collection Time: 03/17/21 12:18 AM  Result Value Ref Range   Magnesium 1.9 1.7 - 2.4 mg/dL    Comment: Performed  at [facility] [address], [place], [place] [address]  CBC     Status: Abnormal   Collection Time: 03/17/21 12:18 AM  Result Value Ref Range   WBC 11.0 (H) 4.0 - 10.5 K/uL   RBC 4.48 4.22 - 5.81 MIL/uL   Hemoglobin 15.2 13.0 - 17.0 g/dL   HCT 45.0 39.0 - 52.0 %   MCV 100.4 (H) 80.0 - 100.0 fL   MCH 33.9 26.0 - 34.0 pg   MCHC 33.8 30.0 - 36.0 g/dL   RDW 12.7 11.5 - 15.5 %   Platelets 216 150 - 400 K/uL   nRBC 0.0 0.0 - 0.2 %    Comment: Performed at [facility], [place] [address], [place], [place] [address]  Heparin level (unfractionated)     Status: Abnormal   Collection Time: 03/17/21 12:18 AM  Result Value Ref Range   Heparin Unfractionated 0.23 (L) 0.30 - 0.70 IU/mL    Comment: (NOTE) The clinical reportable range upper limit is being lowered to >1.10 to align with the FDA approved guidance for the current laboratory assay.  If heparin results are below expected values, and patient dosage has  been confirmed, suggest follow up testing of antithrombin III levels. Performed at [facility], [place] [address], [place], [place] [address]   Glucose, capillary     Status: None   Collection Time: 03/17/21  3:11 AM  Result Value Ref Range   Glucose-Capillary 98 70 - 99 mg/dL    Comment: Glucose reference range applies only to samples taken after fasting for at least 8 hours.  Glucose, capillary     Status: Abnormal   Collection Time: 03/17/21  8:14 AM  Result Value Ref Range   Glucose-Capillary 115 (H) 70 - 99 mg/dL    Comment: Glucose reference range applies only to samples taken after fasting for at least 8 hours.   Comment 1 Notify RN    Comment 2 Document in Chart    IR US Guide Vasc Access Right  Result Date: 03/15/2021 INDICATION: 65 year old male with history significant for EtOH abuse and atrial fibrillation presenting with left hemiplegia, right sided jerking after attempted cardioversion by EMS for afib with RVR. His last known well was 10 p.m. on  March 11, 2021. NIHSS at presentation 26; premorbid Rankin scale 2. Head CT showed a right basal ganglia infarct (ASPECTS 9). CT angiogram of the head and neck showed an occlusion of the right M1/MCA segment with poor collaterals. He was transferred to our service for a diagnostic cerebral angiogram and mechanical thrombectomy. EXAM: GUIDED VASCULAR ACCESS DIAGNOSTIC CEREBRAL ANGIOGRAM MECHANICAL THROMBECTOMY FLAT PANEL HEAD CT COMPARISON:  CT angiogram of the head and neck March 12, 2021 MEDICATIONS: No antibiotic administered. ANESTHESIA/SEDATION: The procedure was performed under  general anesthesia. CONTRAST:  65 mL of Omnipaque 240 milligram/mL FLUOROSCOPY TIME:  Fluoroscopy Time: 21 minutes 48 seconds (841 mGy). COMPLICATIONS: None immediate. TECHNIQUE: Informed written consent was not obtained. Multiple attempts to reach next of kin were unsuccessful. Discussion held with Dr. Quinn Axe (neurology) and decision made to proceed with mechanical thrombectomy given imminent risk of significant morbidity and mortality in the absence of emergency endovascular treatment of right MCA occlusion. Maximal Sterile Barrier Technique was utilized including caps, mask, sterile gowns, sterile gloves, sterile drape, hand hygiene and skin antiseptic. A timeout was performed prior to the initiation of the procedure. The right groin was prepped and draped in the usual sterile fashion. Using a micropuncture kit and the modified Seldinger technique, access was gained to the right common femoral artery and an 8 French sheath was placed. Real-time ultrasound guidance was utilized for vascular access including the acquisition of a permanent ultrasound image documenting patency of the accessed vessel. Under fluoroscopy, a Zoom 88 guide catheter was navigated over a 6 [place] Berenstein 2 catheter and a 0.035" Terumo Glidewire into the aortic arch. The catheter was placed into the right common carotid artery and then advanced into the  right internal carotid artery. The inner catheter was removed. Frontal and lateral angiograms of the head were obtained. FINDINGS: 1. The femoral artery is patent, with adequate caliber for vascular access. 2. Occlusion of the right M1/MCA just distal to the origin of the anterior temporal branch. PROCEDURE: Under biplane roadmap, a zoom 71 aspiration catheter was navigated over an Aristotle 24 microguidewire into the cavernous segment of the right ICA. The aspiration catheter was then advanced to the level of occlusion and connected to an aspiration pump. Continuous aspiration was performed for 2 minutes. The aspiration catheter was subsequently removed under constant aspiration. The guide catheter was aspirated for debris. Due to absent back flow in guide catheter, it was withdrawn under continuous aspiration. Under fluoroscopy, the Zoom 88 guide catheter was navigated over a 6 [place] Berenstein 2 catheter and a 0.035" Terumo Glidewire into the aortic arch. The catheter was placed into the right common carotid artery and then advanced into the right internal carotid artery. The inner catheter was removed. Frontal and lateral angiograms of the head were obtained. Recanalization of the right M1/MCA segment seen with occlusion of the distal right M2/MCA anterior division branch. Under biplane roadmap, a Zoom 35 aspiration catheter was navigated over an Aristotle 14 microguidewire into the cavernous segment of the right ICA. The aspiration catheter was then advanced to the level of occlusion and connected to an aspiration pump. Continuous aspiration was performed for 2 minutes. The guide catheter was connected to a VacLok syringe. The aspiration catheter was subsequently removed under constant aspiration. The guide catheter was aspirated for debris. Follow-up angiogram showed persistent occlusion of the distal right M3/MCA segment. Under biplane roadmap, a Zoom 35 aspiration catheter was navigated over an Aristotle 14  microguidewire into the cavernous segment of the right ICA. The aspiration catheter was then advanced to the level of occlusion in the right M3 segment and connected to an aspiration pump. Continuous aspiration was performed for 2 minutes. The guide catheter was connected to a VacLok syringe. The aspiration catheter was subsequently removed under constant aspiration. The guide catheter was aspirated for debris. Right ICA angiogram showed complete reperfusion of the right MCA vascular tree (TICI3). Flat panel CT of the head was obtained and post processed in a separate workstation with concurrent attending physician supervision. Selected images  were sent to PACS. Trace subarachnoid hemorrhage in the right sylvian fissure noted. A right common femoral artery angiogram was obtained in right anterior oblique view. The puncture is at the level of the common femoral artery which has adequate caliber for closure device. The femoral sheath was then exchanged over the wire for a Perclose pro style which was then utilized for access closure. Immediate hemostasis was achieved. IMPRESSION: 1. Successful mechanical thrombectomy performed with direct contact aspiration for treatment of a proximal right M1/MCA occlusion with complete recanalization after 3 passes (TICI 3). 2. Trace subarachnoid hemorrhage in the right sylvian fissure on postprocedural flat panel head CT. PLAN: 1. Transfer to ICU. 2. Follow-up head CT in 3 hours to ensure stability of trace subarachnoid hemorrhage in right sylvian fissure. Electronically Signed   By: Pedro Earls M.D.   On: 03/15/2021 16:16   DG Shoulder Left Port  Result Date: 03/16/2021 CLINICAL DATA:  Fall. EXAM: LEFT SHOULDER COMPARISON:  None. FINDINGS: There is no evidence of fracture or dislocation. There is no evidence of arthropathy or other focal bone abnormality. Soft tissues are unremarkable. IMPRESSION: Negative. Electronically Signed   By: Ronney Asters M.D.   On:  03/16/2021 16:11       Medical Problem List and Plan: 1.  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  Antithrombotics: -DVT/anticoagulation:  Pharmaceutical: Other (comment)--transitioned to Eliquis on 09/08  -antiplatelet therapy: N/A 3. Left shoulder/right knee pain/Pain Management: Local measures with Voltaren gel/ice/heat   --will Xray right knee to rule out trauma.  -also could be Gout -pt denied major knee pain PTA 4. Mood: LCSW to follow for evaluation and support.   -antipsychotic agents: N/A 5. Neuropsych: This patient is not fully capable of making decisions on his own behalf. 6. Skin/Wound Care: Routine pressure relief measures.  7. Fluids/Electrolytes/Nutrition: Monitor I/O. Check CMET in am.  8. 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   --Monitor HR TID--continue Eliquis and Lopressor 9. HTN: Monitor BP TID--on Micardis, Lopressor,  10. RUE shaking movements: Felt to be postural tremor and Keppra d/c 09/06 11. Leucocytosis: Monitor for fevers and other signs of infection.  12. Hyperlipidemia: Now on Lipitor         Bary Leriche, PA-C 03/17/2021

## 2021-03-17 NOTE — Progress Notes (Signed)
ANTICOAGULATION CONSULT NOTE   Pharmacy Consult for Heparin Indication: atrial fibrillation and stroke  No Known Allergies  Patient Measurements: Height: 6' (182.9 cm) Weight: 90 kg (198 lb 6.6 oz) IBW/kg (Calculated) : 77.6 Heparin Dosing Weight: 90 kg  Vital Signs: Temp: 99.1 F (37.3 C) (09/07 2350) Temp Source: Oral (09/07 2350) BP: 162/94 (09/07 2350) Pulse Rate: 83 (09/07 2350)  Labs: Recent Labs    03/15/21 0206 03/15/21 0206 03/15/21 1810 03/16/21 0246 03/16/21 1106 03/16/21 1856 03/17/21 0018  HGB 14.8  --   --  15.2  --   --  15.2  HCT 43.8  --   --  45.0  --   --  45.0  PLT 192  --   --  181  --   --  216  APTT  --   --  37* 33  --   --   --   HEPARINUNFRC  --    < >  --  <0.10* <0.10* 0.13* 0.23*  CREATININE 0.98  --   --  0.81  --   --  0.76   < > = values in this interval not displayed.     Estimated Creatinine Clearance: 101 mL/min (by C-G formula based on SCr of 0.76 mg/dL).   Medical History: Past Medical History:  Diagnosis Date   Anxiety    Elevated LFTs    History of colon polyps    HLD (hyperlipidemia)    HTN (hypertension)    Localized superficial swelling, mass, or lump    Assessment: Presented to ED with CVA/TIA, no TNK given because outside of window. Patient has PMH of atrial fibrillation possibly on Eliquis per EMS (unknown compliance).  Pharmacy consulted for heparin.  No s/sx of bleeding noted, CBC stable.  Due to risk of bleeding with acute CVA no bolus is indicated.   Morning heparin level still slightly below goal at 0.23. No bleeding issues noted. CBC within normal limits.   Goal of Therapy:  Heparin Level 0.3-0.5 units/mL Monitor platelets by anticoagulation protocol: Yes   Plan:  Increase Heparin infusion to 1850 units/hr Recheck anti-Xa level in 6-8 hours Continue to monitor H&H and platelets  Sheppard Coil PharmD., BCPS Clinical Pharmacist 03/17/2021 2:36 AM

## 2021-03-18 ENCOUNTER — Inpatient Hospital Stay (HOSPITAL_COMMUNITY): Payer: Medicare Other

## 2021-03-18 DIAGNOSIS — D72829 Elevated white blood cell count, unspecified: Secondary | ICD-10-CM

## 2021-03-18 DIAGNOSIS — I10 Essential (primary) hypertension: Secondary | ICD-10-CM | POA: Diagnosis not present

## 2021-03-18 DIAGNOSIS — I63511 Cerebral infarction due to unspecified occlusion or stenosis of right middle cerebral artery: Secondary | ICD-10-CM | POA: Diagnosis not present

## 2021-03-18 DIAGNOSIS — M25461 Effusion, right knee: Secondary | ICD-10-CM

## 2021-03-18 DIAGNOSIS — I6783 Posterior reversible encephalopathy syndrome: Secondary | ICD-10-CM | POA: Diagnosis not present

## 2021-03-18 DIAGNOSIS — M19032 Primary osteoarthritis, left wrist: Secondary | ICD-10-CM | POA: Diagnosis not present

## 2021-03-18 LAB — CBC WITH DIFFERENTIAL/PLATELET
Abs Immature Granulocytes: 0.07 10*3/uL (ref 0.00–0.07)
Basophils Absolute: 0.1 10*3/uL (ref 0.0–0.1)
Basophils Relative: 0 %
Eosinophils Absolute: 0 10*3/uL (ref 0.0–0.5)
Eosinophils Relative: 0 %
HCT: 44.9 % (ref 39.0–52.0)
Hemoglobin: 15.5 g/dL (ref 13.0–17.0)
Immature Granulocytes: 0 %
Lymphocytes Relative: 8 %
Lymphs Abs: 1.3 10*3/uL (ref 0.7–4.0)
MCH: 34.4 pg — ABNORMAL HIGH (ref 26.0–34.0)
MCHC: 34.5 g/dL (ref 30.0–36.0)
MCV: 99.6 fL (ref 80.0–100.0)
Monocytes Absolute: 1.7 10*3/uL — ABNORMAL HIGH (ref 0.1–1.0)
Monocytes Relative: 11 %
Neutro Abs: 12.7 10*3/uL — ABNORMAL HIGH (ref 1.7–7.7)
Neutrophils Relative %: 81 %
Platelets: 253 10*3/uL (ref 150–400)
RBC: 4.51 MIL/uL (ref 4.22–5.81)
RDW: 12.9 % (ref 11.5–15.5)
WBC: 15.9 10*3/uL — ABNORMAL HIGH (ref 4.0–10.5)
nRBC: 0 % (ref 0.0–0.2)

## 2021-03-18 LAB — COMPREHENSIVE METABOLIC PANEL
ALT: 16 U/L (ref 0–44)
AST: 14 U/L — ABNORMAL LOW (ref 15–41)
Albumin: 2.7 g/dL — ABNORMAL LOW (ref 3.5–5.0)
Alkaline Phosphatase: 94 U/L (ref 38–126)
Anion gap: 11 (ref 5–15)
BUN: 9 mg/dL (ref 8–23)
CO2: 23 mmol/L (ref 22–32)
Calcium: 8.6 mg/dL — ABNORMAL LOW (ref 8.9–10.3)
Chloride: 100 mmol/L (ref 98–111)
Creatinine, Ser: 0.88 mg/dL (ref 0.61–1.24)
GFR, Estimated: >60 mL/min (ref >60)
Glucose, Bld: 116 mg/dL — ABNORMAL HIGH (ref 70–99)
Potassium: 3.2 mmol/L — ABNORMAL LOW (ref 3.5–5.1)
Sodium: 134 mmol/L — ABNORMAL LOW (ref 135–145)
Total Bilirubin: 1.5 mg/dL — ABNORMAL HIGH (ref 0.3–1.2)
Total Protein: 5.8 g/dL — ABNORMAL LOW (ref 6.5–8.1)

## 2021-03-18 LAB — MAGNESIUM: Magnesium: 1.8 mg/dL (ref 1.7–2.4)

## 2021-03-18 LAB — URIC ACID: Uric Acid, Serum: 5.2 mg/dL (ref 3.7–8.6)

## 2021-03-18 MED ORDER — POTASSIUM CHLORIDE CRYS ER 20 MEQ PO TBCR
20.0000 meq | EXTENDED_RELEASE_TABLET | Freq: Every day | ORAL | Status: DC
  Administered 2021-03-18 – 2021-03-24 (×7): 20 meq via ORAL
  Filled 2021-03-18 (×8): qty 1

## 2021-03-18 MED ORDER — MAGNESIUM OXIDE -MG SUPPLEMENT 400 (240 MG) MG PO TABS
200.0000 mg | ORAL_TABLET | Freq: Every day | ORAL | Status: DC
  Administered 2021-03-18 – 2021-04-02 (×16): 200 mg via ORAL
  Filled 2021-03-18 (×16): qty 1

## 2021-03-18 NOTE — Evaluation (Signed)
Speech Language Pathology Assessment and Plan  Patient Details  Name: Patrick Padilla MRN: [id number] Date of Birth: August 20, 1955  SLP Diagnosis: Dysphagia;Cognitive Impairments  Rehab Potential: Good ELOS: 3-3.5 weeks    Today's Date: 03/18/2021 SLP Individual Time: 0930-1030 SLP Individual Time Calculation (min): 60 min   Hospital Problem: Principal Problem:   Acute ischemic right MCA stroke ([place])  Past Medical History:  Past Medical History:  Diagnosis Date   Anxiety    Elevated LFTs    History of colon polyps    HLD (hyperlipidemia)    HTN (hypertension)    Localized superficial swelling, mass, or lump    Past Surgical History:  Past Surgical History:  Procedure Laterality Date   CARDIOVERSION N/A 02/05/2019   Procedure: CARDIOVERSION;  Surgeon: Josue Hector, MD;  Location: [facility];  Service: Cardiovascular;  Laterality: N/A;   CERVICAL DISC SURGERY     IR CT HEAD LTD  03/12/2021   IR PERCUTANEOUS ART THROMBECTOMY/INFUSION INTRACRANIAL INC DIAG ANGIO  03/12/2021   IR US GUIDE VASC ACCESS RIGHT  03/15/2021   LEG SURGERY     right leg and ankle   NECK SURGERY     cyst removed   RADIOLOGY WITH ANESTHESIA N/A 03/12/2021   Procedure: RADIOLOGY WITH ANESTHESIA;  Surgeon: Radiologist, Medication, MD;  Location: [facility];  Service: Radiology;  Laterality: N/A;   TEE WITHOUT CARDIOVERSION N/A 02/05/2019   Procedure: TRANSESOPHAGEAL ECHOCARDIOGRAM (TEE);  Surgeon: Josue Hector, MD;  Location: [facility] ENDOSCOPY;  Service: Cardiovascular;  Laterality: N/A;   TOTAL HIP ARTHROPLASTY     left    Assessment / Plan / Recommendation Clinical Impression Patient is a 65 year old RH-male with history of A fib s/p CV in the past, ETOH abuse who activated EMS  on 03/12/21 with reports of confusion, CP and diaphoresis, was found to be in A fib with RVR and CV attempts enroute to ED. He was hemiplegic with RUE jerking moments, was obtundent with hypoxic respiratory failure requiring intubation in ED. CTP  showed R-MCA infarct. He underwent emergent cerebral angio with thrombectomy  R-M1/MCA occlusion with complete recanalization. LTM-EEG showed diffuse encephalopathy without seizure or epileptiform discharges and he tolerated extubation by 09/05. Mentation has been improving with increase in movement on the left and has had bouts of agitation felt to be due to ETOH withdrawal therefore started on CIWA protocol.  Follow up MRI head showed acute infarct in right basal ganglia, few punctate bifrontal peripheral infarcts and recent left thalamic infarct question subacute as well as PRES appearance in bilateral parieto-occipital cortex and small volume blood products at right sylvian fissure. He continues to be limited by left sided weakness with fatigue, mild left inattention, and cognitive deficits. CIR recommended due to functional decline. Patient admitted 03/17/21.  Upon arrival, patient was awake in bed but appeared lethargic requiring Min verbal cues for arousal at times. Patient's overall receptive and expressive language appeared [facility] for tasks assessed but multiple repetitions were needed secondary to patient being hard of hearing. Patient demonstrated moderate cognitive impairments impacting sustained attention, functional problem solving, intellectual awareness and recall of daily information which impacts his safety with functional and familiar tasks. Patient demonstrates right facial weakness resulting in imprecise consonants, however, patient was 100% intelligible.  Due to patient's right oral-motor weakness and missing dentition, patient with mildly prolonged mastication with solid textures. Patient also demonstrates mild right anterior spillage with both solids and liquids requiring Min-Mod verbal cues to self-monitor and correct. Patient consumed large,  consecutive sips of thin liquids via straw without overt s/s of aspiration with the exception of a delayed cough X 1.  Recommend patient continue current  diet of Dys. 3 textures with thin liquids. Also recommend full supervision to maximize PO intake and overall safety with meals. Patient would benefit from skilled SLP intervention to maximize his cognitive and swallowing function prior to discharge.    Skilled Therapeutic Interventions          Administered a cognitive-linguistic evaluation and BSE, please see above for details.   SLP Assessment  Patient will need skilled Speech Lanaguage Pathology Services during CIR admission    Recommendations  SLP Diet Recommendations: Dysphagia 3 (Mech soft);Thin Liquid Administration via: Cup;Straw Medication Administration: Whole meds with liquid Supervision: Patient able to self feed;Full supervision/cueing for compensatory strategies Compensations: Minimize environmental distractions;Slow rate;Small sips/bites;Monitor for anterior loss Postural Changes and/or Swallow Maneuvers: Seated upright 90 degrees Oral Care Recommendations: Oral care BID Patient destination: Home Follow up Recommendations: 24 hour supervision/assistance;Home Health SLP Equipment Recommended: None recommended by SLP    SLP Frequency 3 to 5 out of 7 days   SLP Duration  SLP Intensity  SLP Treatment/Interventions 3-3.5 weeks  Minumum of 1-2 x/day, 30 to 90 minutes  Cognitive remediation/compensation;Cueing hierarchy;Functional tasks;Environmental controls;Internal/external aids;Dysphagia/aspiration precaution training;Therapeutic Activities;Patient/family education    Pain No/Denies Pain   Prior Functioning Cognitive/Linguistic Baseline: Information not available Type of Home: House  Lives With: Son Available Help at Discharge: Family Vocation: Self employed  SLP Evaluation Cognition Overall Cognitive Status: Impaired/Different from baseline Arousal/Alertness: Awake/alert Orientation Level: Oriented to person Year: Other (Comment) Month: September Day of Week: Incorrect Attention: Sustained Focused  Attention: Appears intact Sustained Attention: Impaired Sustained Attention Impairment: Functional basic Memory: Impaired Memory Impairment: Storage deficit Awareness: Impaired Awareness Impairment: Intellectual impairment Problem Solving: Impaired Problem Solving Impairment: Functional basic Safety/Judgment: Impaired  Comprehension Auditory Comprehension Overall Auditory Comprehension: Appears within functional limits for tasks assessed Expression Expression Primary Mode of Expression: Verbal Oral Motor Oral Motor/Sensory Function Overall Oral Motor/Sensory Function: Mild impairment Facial ROM: Reduced right Facial Symmetry: Abnormal symmetry right Facial Strength: Reduced right Facial Sensation: Reduced right Motor Speech Overall Motor Speech: Impaired Respiration: Within functional limits Phonation: Normal Resonance: Within functional limits Articulation: Impaired Level of Impairment: Conversation Intelligibility: Intelligible Motor Planning: Witnin functional limits  Care Tool Care Tool Cognition Ability to hear (with hearing aid or hearing appliances if normally used Ability to hear (with hearing aid or hearing appliances if normally used): 1. Minimal difficulty - difficulty in some environments (e.g. when person speaks softly or setting is noisy)   Expression of Ideas and Wants Expression of Ideas and Wants: 3. Some difficulty - exhibits some difficulty with expressing needs and ideas (e.g, some words or finishing thoughts) or speech is not clear   Understanding Verbal and Non-Verbal Content Understanding Verbal and Non-Verbal Content: 3. Usually understands - understands most conversations, but misses some part/intent of message. Requires cues at times to understand  Memory/Recall Ability Memory/Recall Ability : That he or she is in a hospital/hospital unit;Current season   Bedside Swallowing Assessment General Date of Onset: 03/12/21 Previous Swallow Assessment:  BSE on 9/5 Diet Prior to this Study: Dysphagia 3 (soft);Thin liquids Temperature Spikes Noted: No Respiratory Status: Room air History of Recent Intubation: Yes Length of Intubations (days): 3 days Date extubated: 03/14/21 Behavior/Cognition: Alert;Cooperative;Distractible;Requires cueing Oral Cavity - Dentition: Poor condition;Missing dentition Self-Feeding Abilities: Able to feed self;Needs assist Vision: Functional for self-feeding Patient Positioning: Upright in chair/Tumbleform Baseline  Vocal Quality: Normal Volitional Cough: Weak Volitional Swallow: Able to elicit  Ice Chips Ice chips: Not tested Thin Liquid Thin Liquid: Impaired Presentation: Straw Pharyngeal  Phase Impairments: Suspected delayed Swallow Other Comments: delayed subtle cough X 1 throughout session Nectar Thick Nectar Thick Liquid: Not tested Honey Thick Honey Thick Liquid: Not tested Puree Puree: Impaired Presentation: Self Fed;Spoon Oral Phase Impairments: Reduced labial seal Oral Phase Functional Implications: Right anterior spillage Solid Solid: Impaired Presentation: Self Fed;Spoon Oral Phase Impairments: Reduced labial seal Oral Phase Functional Implications: Right anterior spillage;Impaired mastication BSE Assessment Risk for Aspiration Impact on safety and function: Mild aspiration risk Other Related Risk Factors: Lethargy;Decreased respiratory status;Cognitive impairment  Short Term Goals: Week 1: SLP Short Term Goal 1 (Week 1): Patient will demonstrate orientation to place, time and situation with use of external aids with Min verbal cues. SLP Short Term Goal 2 (Week 1): Patient will demonstrate sustained attention to functional tasks for ~30 minutes with Mod verbal cues for redirection. SLP Short Term Goal 3 (Week 1): Patient will demonstrate functional problem solving for basic and familiar tasks with Min verbal cues. SLP Short Term Goal 4 (Week 1): Patient will self-monitor and correct  errors during functional tasks with Mod verbal cues. SLP Short Term Goal 5 (Week 1): Patient will consume current diet with minimal overt s/s of aspiration with Min verbal cues for use of swallowing compensatory strategies.  Refer to Care Plan for Long Term Goals  Recommendations for other services: None   Discharge Criteria: Patient will be discharged from SLP if patient refuses treatment 3 consecutive times without medical reason, if treatment goals not met, if there is a change in medical status, if patient makes no progress towards goals or if patient is discharged from hospital.  The above assessment, treatment plan, treatment alternatives and goals were discussed and mutually agreed upon: by patient  Kenneisha Cochrane 03/18/2021, 3:10 PM

## 2021-03-18 NOTE — Progress Notes (Signed)
CH received [place] consult to contact pt.'s son Patrick Padilla via phone to discuss advanced directives.  Son says pt. has been conversational during visits but also has been hallucinating and is not fully oriented.  CH explained that pt. will not be able to complete AD unless he is determined to have capacity by medical team.  Son says pt. is separated from his spouse and that he and his brother are next of kin.  Patrick Padilla will reach out to spiritual care team with any additional questions.  [name], Chaplain Pager: [phone]

## 2021-03-18 NOTE — Progress Notes (Signed)
PROGRESS NOTE   Subjective/Complaints: Pt up with OT. No issues overnight. Right knee and left wrist sore.   ROS: Patient denies fever, rash, sore throat, blurred vision, nausea, vomiting, diarrhea, cough, shortness of breath or chest pain, joint or back pain, headache, or mood change.      Objective:   DG Shoulder Left Port  Result Date: 03/16/2021 CLINICAL DATA:  Fall. EXAM: LEFT SHOULDER COMPARISON:  None. FINDINGS: There is no evidence of fracture or dislocation. There is no evidence of arthropathy or other focal bone abnormality. Soft tissues are unremarkable. IMPRESSION: Negative. Electronically Signed   By: Darliss Cheney M.D.   On: 03/16/2021 16:11   DG Knee Complete 4 Views Right  Result Date: 03/17/2021 CLINICAL DATA:  Right knee pain. EXAM: RIGHT KNEE - COMPLETE 4+ VIEW COMPARISON:  None. FINDINGS: Normal alignment. Normal joint spaces. No fracture. There is a moderate knee joint effusion. No erosion, bone destruction, or focal bone abnormality. Mild medial soft tissue edema. IMPRESSION: Moderate knee joint effusion. No acute osseous abnormality. Medial soft tissue edema. Electronically Signed   By: Narda Rutherford M.D.   On: 03/17/2021 21:29   Recent Labs    03/17/21 0018 03/18/21 0508  WBC 11.0* 15.9*  HGB 15.2 15.5  HCT 45.0 44.9  PLT 216 253   Recent Labs    03/17/21 0018 03/18/21 0508  NA 138 134*  K 3.5 3.2*  CL 105 100  CO2 25 23  GLUCOSE 99 116*  BUN 9 9  CREATININE 0.76 0.88  CALCIUM 8.7* 8.6*    Intake/Output Summary (Last 24 hours) at 03/18/2021 1107 Last data filed at 03/18/2021 0700 Gross per 24 hour  Intake 0 ml  Output 150 ml  Net -150 ml        Physical Exam: Vital Signs Blood pressure (!) 164/99, pulse 98, temperature 98.6 F (37 C), temperature source Oral, resp. rate 17, height 6' (1.829 m), weight 95.7 kg, SpO2 95 %.  General: Alert and oriented x 3, No apparent distress HEENT:  Head is normocephalic, atraumatic, PERRLA, EOMI, sclera anicteric, oral mucosa pink and moist, dentition intact, ext ear canals clear,  Neck: Supple without JVD or lymphadenopathy Heart: Reg rate and rhythm. No murmurs rubs or gallops Chest: CTA bilaterally without wheezes, rales, or rhonchi; no distress Abdomen: Soft, non-tender, non-distended, bowel sounds positive. Extremities: No clubbing, cyanosis, or edema. Pulses are 2+ Psych: Pt's affect is appropriate. Pt is cooperative Skin: some excoriation of gluteal cleft Neuro:  alert, follows basic commands with delay. Oriented to person, hospital. Leans to right when at EOB. RUE 5/5. LUE 3/5. LLE 3/5 RLE limited still by knee pain Musculoskeletal: right knee pain, effusion. No warmth. Left wrist tender    Assessment/Plan: 1. Functional deficits which require 3+ hours per day of interdisciplinary therapy in a comprehensive inpatient rehab setting. Physiatrist is providing close team supervision and 24 hour management of active medical problems listed below. Physiatrist and rehab team continue to assess barriers to discharge/monitor patient progress toward functional and medical goals  Care Tool:  Bathing    Body parts bathed by patient: Left arm, Chest, Abdomen, Front perineal area, Face   Body parts  bathed by helper: Right arm, Buttocks, Right upper leg, Left upper leg, Right lower leg, Left lower leg     Bathing assist Assist Level: Maximal Assistance - Patient 24 - 49% (MAX A STS)     Upper Body Dressing/Undressing Upper body dressing   What is the patient wearing?: Pull over shirt    Upper body assist Assist Level: Minimal Assistance - Patient > 75%    Lower Body Dressing/Undressing Lower body dressing      What is the patient wearing?: Underwear/pull up, Pants     Lower body assist Assist for lower body dressing: 2 Helpers     Toileting Toileting    Toileting assist Assist for toileting: 2 Helpers      Transfers Chair/bed transfer  Transfers assist  Chair/bed transfer activity did not occur: Safety/medical concerns        Locomotion Ambulation   Ambulation assist              Walk 10 feet activity   Assist           Walk 50 feet activity   Assist           Walk 150 feet activity   Assist           Walk 10 feet on uneven surface  activity   Assist           Wheelchair     Assist               Wheelchair 50 feet with 2 turns activity    Assist            Wheelchair 150 feet activity     Assist          Blood pressure (!) 164/99, pulse 98, temperature 98.6 F (37 C), temperature source Oral, resp. rate 17, height 6' (1.829 m), weight 95.7 kg, SpO2 95 %.  Medical Problem List and Plan: 1.  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  Antithrombotics: -DVT/anticoagulation:  Pharmaceutical: Other (comment)--transitioned to Eliquis on 09/08             -antiplatelet therapy: N/A 3. Left shoulder/right knee pain/Pain Management: Local measures with Voltaren gel/ice/heat. Favors knee in weight bearing             --xray demonstrates effusion seen on exam. Voltaren helping somewhat -will check uric acid level given c/o left wrist pain.  -pt denied major knee pain PTA 4. Mood: LCSW to follow for evaluation and support.              -antipsychotic agents: N/A 5. Neuropsych: This patient is not  capable of making decisions on his own behalf. 6. Skin/Wound Care: Routine pressure relief measures.  7. Fluids/Electrolytes/Nutrition: encourage PO -hypokalemia: supplement -protein supp for low albumin  8. 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              --Monitor HR TID--continue Eliquis and Lopressor 9. HTN: Monitor BP TID--on  Micardis, Lopressor,  10. RUE shaking movements: Felt to be postural tremor and Keppra d/c 09/06 11. Leucocytosis: wbc's up to 15.9 today (had dropped to 11k)  -blood cx staph epi (contaminate) -check ua and  ucx today -have to consider jts as source also given pain and effusion 12. Hyperlipidemia: Now on Lipitor     LOS: 1 days A FACE TO FACE EVALUATION WAS PERFORMED  Ranelle Oyster 03/18/2021, 11:07 AM

## 2021-03-18 NOTE — Progress Notes (Addendum)
Inpatient rehab MD has orders for alternative medications to naproxen and senna/docusate.  Inpatient rehab orders for bisacodyl daily prn moderate constipation, miralax 17 g po daily prn for mild constipation and Fleet enenat once prn for severe constipation,   and Acetampinophen 325-650 mg po q4hprn mild pain.  Noah Delaine, RPh Clinical Pharmacist [phone] Please check AMION for all [facility] phone numbers After 10:00 PM, call Main Pharmacy [phone] 03/18/2021 9:38 AM

## 2021-03-18 NOTE — IPOC Note (Signed)
Overall Plan of Care [facility]) Patient Details Name: Patrick Padilla MRN: [id number] DOB: Jun 09, 1956  Admitting Diagnosis: Acute ischemic right MCA stroke [facility])  Hospital Problems: Principal Problem:   Acute ischemic right MCA stroke [facility])     Functional Problem List: Nursing Bowel, Bladder, Endurance, Safety, Medication Management, Nutrition, Pain  PT Balance, Nutrition, Skin Integrity, Pain, Behavior, Edema, Perception, Safety, Endurance, Motor, Sensory  OT Balance, Cognition, Edema, Endurance, Motor, Sensory, Vision, Pain, Safety, Perception  SLP Cognition, Nutrition  TR         Basic ADL's: OT Grooming, Bathing, Dressing, Toileting     Advanced  ADL's: OT       Transfers: PT Bed Mobility, Bed to Chair, Car, Occupational psychologist, Research scientist (life sciences): PT Ambulation, Psychologist, prison and probation services, Stairs     Additional Impairments: OT Fuctional Use of Upper Extremity  SLP Swallowing, Social Cognition   Social Interaction, Problem Solving, Awareness, Attention, Memory  TR      Anticipated Outcomes Item Anticipated Outcome  Self Feeding S  Swallowing  Mod I   Basic self-care  S  Toileting  S   Bathroom Transfers S  Bowel/Bladder  manage bowel and bladder w min assist  Transfers  Supervision using LRAD  Locomotion  Min A using LRAD 50 ft  Communication     Cognition  Supervision  Pain  at or below level 4  Safety/Judgment  maintain safety w cues   Therapy Plan: PT Intensity: Minimum of 1-2 x/day ,45 to 90 minutes PT Frequency: 5 out of 7 days PT Duration Estimated Length of Stay: 3 weeks OT Intensity: Minimum of 1-2 x/day, 45 to 90 minutes OT Frequency: 5 out of 7 days OT Duration/Estimated Length of Stay: 3-3.5 weeks SLP Intensity: Minumum of 1-2 x/day, 30 to 90 minutes SLP Frequency: 3 to 5 out of 7 days SLP Duration/Estimated Length of Stay: 3-3.5 weeks   Due to the current state of emergency, patients may not be receiving their 3-hours of  Medicare-mandated therapy.   Team Interventions: Nursing Interventions Patient/Family Education, Bowel Management, Pain Management, Discharge Planning, Dysphagia/Aspiration Precaution Training, Medication Management, Disease Management/Prevention, Bladder Management  PT interventions Ambulation/gait training, Balance/vestibular training, Community reintegration, Cognitive remediation/compensation, Discharge planning, Disease management/prevention, Functional electrical stimulation, DME/adaptive equipment instruction, Functional mobility training, Neuromuscular re-education, Patient/family education, Pain management, Psychosocial support, Splinting/orthotics, Therapeutic Activities, UE/LE Strength taining/ROM, Visual/perceptual remediation/compensation, Wheelchair propulsion/positioning, UE/LE Coordination activities, Therapeutic Exercise, Stair training, Skin care/wound management  OT Interventions Balance/vestibular training, Discharge planning, Functional electrical stimulation, Pain management, Self Care/advanced ADL retraining, Therapeutic Activities, UE/LE Coordination activities, Visual/perceptual remediation/compensation, Therapeutic Exercise, Skin care/wound managment, Patient/family education, Functional mobility training, Disease mangement/prevention, Cognitive remediation/compensation, DME/adaptive equipment instruction, Community reintegration, Neuromuscular re-education, Psychosocial support, Splinting/orthotics, UE/LE Strength taining/ROM, Wheelchair propulsion/positioning  SLP Interventions Cognitive remediation/compensation, Financial trader, Functional tasks, Environmental controls, Internal/external aids, Dysphagia/aspiration precaution training, Therapeutic Activities, Patient/family education  TR Interventions    SW/CM Interventions Discharge Planning, Psychosocial Support, Patient/Family Education   Barriers to Discharge MD  Medical stability and pain/right knee instability  Nursing  Decreased caregiver support, Home environment access/layout, Medication compliance, Incontinence Home w son;  1 level 1ste left rail  PT Inaccessible home environment, Incontinence, Behavior    OT Decreased caregiver support, Lack of/limited family support unsure if family can provide 24/7 supervision  SLP Decreased caregiver support, Lack of/limited family support    SW Decreased caregiver support, Lack of/limited family support son works days and son's girlfriend can only provide supervision   Team Discharge  Planning: Destination: PT-Home ,OT- Home , SLP-Home Projected Follow-up: PT-Home health PT, OT-  Home health OT, SLP-24 hour supervision/assistance, Home Health SLP Projected Equipment Needs: PT-To be determined, OT- 3 in 1 bedside comode, Tub/shower bench, To be determined, SLP-None recommended by SLP Equipment Details: PT- , OT-  Patient/family involved in discharge planning: PT- Patient unable/family or caregiver not available,  OT-Patient unable/family or caregiver not available, SLP-Patient  MD ELOS: 21-24 days Medical Rehab Prognosis:  Excellent Assessment: The patient has been admitted for CIR therapies with the diagnosis of CVA/PRES, right knee injury. The team will be addressing functional mobility, strength, stamina, balance, safety, adaptive techniques and equipment, self-care, bowel and bladder mgt, patient and caregiver education, NMR, cognition, communication, pain mgt, community reentry. Goals have been set at supervision to min assist with self-care, mobility and cognition.   Due to the current state of emergency, patients may not be receiving their 3 hours per day of Medicare-mandated therapy.    Ranelle Oyster, MD, FAAPMR     See Team Conference Notes for weekly updates to the plan of care

## 2021-03-18 NOTE — Evaluation (Addendum)
Occupational Therapy Assessment and Plan  Patient Details  Name: Patrick Padilla MRN: [id number] Date of Birth: 11-Oct-1955  OT Diagnosis: abnormal posture, acute pain, cognitive deficits, disturbance of vision, hemiplegia affecting dominant side, muscle weakness (generalized), pain in joint, and swelling of limb Rehab Potential: Rehab Potential (ACUTE ONLY): Good ELOS: 3-3.5 weeks   Today's Date: 03/18/2021 OT Individual Time: 8-9 60 min  Hospital Problem: Principal Problem:   Acute ischemic right MCA stroke ([place])   Past Medical History:  Past Medical History:  Diagnosis Date   Anxiety    Elevated LFTs    History of colon polyps    HLD (hyperlipidemia)    HTN (hypertension)    Localized superficial swelling, mass, or lump    Past Surgical History:  Past Surgical History:  Procedure Laterality Date   CARDIOVERSION N/A 02/05/2019   Procedure: CARDIOVERSION;  Surgeon: Josue Hector, MD;  Location: [place];  Service: Cardiovascular;  Laterality: N/A;   CERVICAL DISC SURGERY     IR CT HEAD LTD  03/12/2021   IR PERCUTANEOUS ART THROMBECTOMY/INFUSION INTRACRANIAL INC DIAG ANGIO  03/12/2021   IR US GUIDE VASC ACCESS RIGHT  03/15/2021   LEG SURGERY     right leg and ankle   NECK SURGERY     cyst removed   RADIOLOGY WITH ANESTHESIA N/A 03/12/2021   Procedure: RADIOLOGY WITH ANESTHESIA;  Surgeon: Radiologist, Medication, MD;  Location: [place];  Service: Radiology;  Laterality: N/A;   TEE WITHOUT CARDIOVERSION N/A 02/05/2019   Procedure: TRANSESOPHAGEAL ECHOCARDIOGRAM (TEE);  Surgeon: Josue Hector, MD;  Location: [facility] ENDOSCOPY;  Service: Cardiovascular;  Laterality: N/A;   TOTAL HIP ARTHROPLASTY     left    Assessment & Plan Clinical Impression: 65 yo patient who presented to ED with L hemiplegia and R sided jerking after attempted cardioversion by EMS for afib with RVR.  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 Few and  punctate bifrontal peripheral infarcts. Recent medial left  thalamic infarct which may be subacute. 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 Extubated 9/5. PMH:  hx EtOH abuse and  afib (unclear if on [facility]), anxiety HTN PSH: cardioversion, L THA   Patient currently requires total with basic self-care skills secondary to muscle weakness, decreased cardiorespiratoy endurance, impaired timing and sequencing, abnormal tone, unbalanced muscle activation, motor apraxia, and decreased coordination, decreased visual acuity, decreased visual perceptual skills, and decreased visual motor skills, decreased attention to left and left side neglect, decreased initiation, decreased attention, decreased awareness, decreased problem solving, decreased safety awareness, and decreased memory, and decreased sitting balance, decreased standing balance, decreased postural control, hemiplegia, and decreased balance strategies.  Prior to hospitalization, patient could complete BADL/IADL with . independent  Patient will benefit from skilled intervention to decrease level of assist with basic self-care skills and increase independence with basic self-care skills prior to discharge home with care partner.  Anticipate patient will require 24 hour supervision and follow up home health.  OT - End of Session Activity Tolerance: Tolerates 30+ min activity with multiple rests Endurance Deficit: Yes OT Assessment Rehab Potential (ACUTE ONLY): Good OT Barriers to Discharge: Decreased caregiver support;Lack of/limited family support OT Barriers to Discharge Comments: unsure if family can provide 24/7 supervision OT Patient demonstrates impairments in the following area(s): Balance;Cognition;Edema;Endurance;Motor;Sensory;Vision;Pain;Safety;Perception OT Basic ADL's Functional Problem(s): Grooming;Bathing;Dressing;Toileting OT Transfers Functional Problem(s): Toilet;Tub/Shower OT Additional Impairment(s): Fuctional Use of Upper Extremity OT  Plan OT Intensity: Minimum of 1-2 x/day, 45 to 90 minutes OT  Frequency: 5 out of 7 days OT Duration/Estimated Length of Stay: 3-3.5 weeks OT Treatment/Interventions: Balance/vestibular training;Discharge planning;Functional electrical stimulation;Pain management;Self Care/advanced ADL retraining;Therapeutic Activities;UE/LE Coordination activities;Visual/perceptual remediation/compensation;Therapeutic Exercise;Skin care/wound managment;Patient/family education;Functional mobility training;Disease mangement/prevention;Cognitive remediation/compensation;DME/adaptive equipment instruction;Community reintegration;Neuromuscular re-education;Psychosocial support;Splinting/orthotics;UE/LE Strength taining/ROM;Wheelchair propulsion/positioning OT Self Feeding Anticipated Outcome(s): S OT Basic Self-Care Anticipated Outcome(s): S OT Toileting Anticipated Outcome(s): S OT Bathroom Transfers Anticipated Outcome(s): S OT Recommendation Patient destination: Home Follow Up Recommendations: Home health OT Equipment Recommended: 3 in 1 bedside comode;Tub/shower bench;To be determined   OT Evaluation Precautions/Restrictions    Home Living/Prior Functioning Home Living Family/patient expects to be discharged to:: Private residence Living Arrangements: Children, Non-relatives/Friends Vision Baseline Vision/History: 1 Wears glasses Ability to See in Adequate Light: 1 Impaired Patient Visual Report: Other (comment) Vision Assessment?: Vision impaired- to be further tested in functional context Perception  Perception: Impaired Comments: L inattention Praxis Praxis: Impaired Praxis Impairment Details: Motor planning Cognition Overall Cognitive Status: Impaired/Different from baseline Arousal/Alertness: Awake/alert Orientation Level: Place;Situation;Person Person: Oriented Place: Oriented Situation: Oriented Year: Other (Comment) Month: September Day of Week: Incorrect Memory: Impaired Memory  Impairment: Storage deficit Focused Attention: Impaired Sustained Attention: Impaired Problem Solving: Impaired Safety/Judgment: Impaired Sensation Sensation Light Touch: Appears Intact Coordination Gross Motor Movements are Fluid and Coordinated: No Fine Motor Movements are Fluid and Coordinated: No Coordination and Movement Description: edema in L first MCP wiht pain as well as R knee Motor  Motor Motor: Hemiplegia Motor - Skilled Clinical Observations: R hemi  Trunk/Postural Assessment    Head forward;l rounded shoudlers/R lean, post pelvic tilt Balance Balance Balance Assessed: Yes Static Sitting Balance Static Sitting - Level of Assistance: 5: Stand by assistance;4: Min assist Dynamic Sitting Balance Dynamic Sitting - Level of Assistance: 4: Min assist;3: Mod assist Static Standing Balance Static Standing - Level of Assistance: 2: Max assist Extremity/Trunk Assessment RUE Assessment RUE Assessment: Within Functional Limits LUE Assessment LUE Assessment: Exceptions to [facility] LUE Body System: Neuro Brunstrum levels for arm and hand: Hand;Arm Brunstrum level for arm: Stage III Synergy is performed voluntarily Brunstrum level for hand: Stage III Synergies performed voluntarily  Care Tool Care Tool Self Care Eating   Eating Assist Level: Set up assist    Oral Care         Bathing   Body parts bathed by patient: Left arm;Chest;Abdomen;Front perineal area;Face Body parts bathed by helper: Right arm;Buttocks;Right upper leg;Left upper leg;Right lower leg;Left lower leg   Assist Level: Maximal Assistance - Patient 24 - 49% (MAX A STS)    Upper Body Dressing(including orthotics)   What is the patient wearing?: Pull over shirt   Assist Level: Minimal Assistance - Patient > 75%    Lower Body Dressing (excluding footwear)   What is the patient wearing?: Underwear/pull up;Pants Assist for lower body dressing: 2 Helpers    Putting on/Taking off footwear     Assist for  footwear: Maximal Assistance - Patient 25 - 49%       Care Tool Toileting Toileting activity   Assist for toileting: 2 Helpers     Care Tool Bed Mobility Roll left and right activity        Sit to lying activity        Lying to sitting on side of bed activity         Care Tool Transfers Sit to stand transfer   Sit to stand assist level: Maximal Assistance - Patient 25 - 49%    Chair/bed transfer Chair/bed transfer activity did not occur:  Safety/medical concerns       Toilet transfer   Assist Level: Dependent - Patient 0% (stedy)     Care Tool Cognition  Expression of Ideas and Wants Expression of Ideas and Wants: 3. Some difficulty - exhibits some difficulty with expressing needs and ideas (e.g, some words or finishing thoughts) or speech is not clear  Understanding Verbal and Non-Verbal Content Understanding Verbal and Non-Verbal Content: 3. Usually understands - understands most conversations, but misses some part/intent of message. Requires cues at times to understand   Memory/Recall Ability Memory/Recall Ability : That he or she is in a hospital/hospital unit;Current season   Refer to Care Plan for Long Term Goals  SHORT TERM GOAL WEEK 1 OT Short Term Goal 1 (Week 1): Pt will sit EOB with Supervision dynamically for 5 min OT Short Term Goal 2 (Week 1): Pt will don shirt wiht MIN A OT Short Term Goal 3 (Week 1): Pt will thread 1LE into pants wiht A for sitting balance only OT Short Term Goal 4 (Week 1): Pt will locate grooming items at sink with no cuing  Recommendations for other services: None    Skilled Therapeutic Intervention 1:1. Pt received in bed agreeable to OT after education on role/purpose of CIR, ELOS, POC, and CVA recovery. See below for ADL details at EOB. Pt with poor arousal in supine therefore at EOB pt with imrpoved arousal but R lean. Pt with swollen L knee and 1st MCP of  hand- appears to be sensitive like gout. Pt requires education on hemi  strategies and midline orientation. BP elevated at 150/89 EOB. Sit to stand at EOB with R knee block and MAX with +2 to pull pants up past hips and VC for posture. Exited session with pt seated in bed, exit alarm on and call light in reach   ADL ADL Grooming: Moderate assistance Where Assessed-Grooming: Edge of bed Upper Body Bathing: Moderate assistance Where Assessed-Upper Body Bathing: Edge of bed Lower Body Bathing: Maximal assistance Where Assessed-Lower Body Bathing: Edge of bed Upper Body Dressing: Moderate assistance Where Assessed-Upper Body Dressing: Edge of bed Lower Body Dressing: Maximal assistance Where Assessed-Lower Body Dressing: Edge of bed Mobility  Bed Mobility Bed Mobility: Supine to Sit;Sit to Supine Supine to Sit: Maximal Assistance - Patient - Patient 25-49% Sit to Supine: Maximal Assistance - Patient 25-49% Transfers Sit to Stand: Maximal Assistance - Patient 25-49% Stand to Sit: Maximal Assistance - Patient 25-49%   Discharge Criteria: Patient will be discharged from OT if patient refuses treatment 3 consecutive times without medical reason, if treatment goals not met, if there is a change in medical status, if patient makes no progress towards goals or if patient is discharged from hospital.  The above assessment, treatment plan, treatment alternatives and goals were discussed and mutually agreed upon: by patient  Tonny Branch 03/18/2021, 1:00 PM

## 2021-03-18 NOTE — Discharge Instructions (Addendum)
   COMMUNITY REFERRALS UPON DISCHARGE:     Outpatient: PT     OT    ST                 Agency:[facility]    Phone:[phone]              Appointment Date/Time:*Please expect follow-up within 7-10 business days. If you have not received follow-up, be sure to contact the site directly.*  Medical Equipment/Items Ordered: pt has DME already                                                 Agency/Supplier: N/A    Information on my medicine - ELIQUIS (apixaban)  This medication education was reviewed with me or my healthcare representative as part of my discharge preparation.    Why was Eliquis prescribed for you? Eliquis was prescribed for you to reduce the risk of a blood clot forming that can cause a stroke if you have a medical condition called atrial fibrillation (a type of irregular heartbeat).  What do You need to know about Eliquis ? Take your Eliquis TWICE DAILY - one tablet in the morning and one tablet in the evening with or without food. If you have difficulty swallowing the tablet whole please discuss with your pharmacist how to take the medication safely.  Take Eliquis exactly as prescribed by your doctor and DO NOT stop taking Eliquis without talking to the doctor who prescribed the medication.  Stopping may increase your risk of developing a stroke.  Refill your prescription before you run out.  After discharge, you should have regular check-up appointments with your healthcare provider that is prescribing your Eliquis.  In the future your dose may need to be changed if your kidney function or weight changes by a significant amount or as you get older.  What do you do if you miss a dose? If you miss a dose, take it as soon as you remember on the same day and resume taking twice daily.  Do not take more than one dose of ELIQUIS at the same time to make up a missed dose.  Important Safety Information A possible side effect of Eliquis is bleeding. You should call  your healthcare provider right away if you experience any of the following: Bleeding from an injury or your nose that does not stop. Unusual colored urine (red or dark brown) or unusual colored stools (red or black). Unusual bruising for unknown reasons. A serious fall or if you hit your head (even if there is no bleeding).  Some medicines may interact with Eliquis and might increase your risk of bleeding or clotting while on Eliquis. To help avoid this, consult your healthcare provider or pharmacist prior to using any new prescription or non-prescription medications, including herbals, vitamins, non-steroidal anti-inflammatory drugs (NSAIDs) and supplements.  This website has more information on Eliquis (apixaban): http://www.eliquis.com/eliquis/home

## 2021-03-18 NOTE — Progress Notes (Signed)
Inpatient Rehabilitation Care Coordinator Assessment and Plan Patient Details  Name: Patrick Padilla MRN: [id number] Date of Birth: 1955-08-04  Today's Date: 03/18/2021  Hospital Problems: Principal Problem:   Acute ischemic right MCA stroke [facility])  Past Medical History:  Past Medical History:  Diagnosis Date   Anxiety    Elevated LFTs    History of colon polyps    HLD (hyperlipidemia)    HTN (hypertension)    Localized superficial swelling, mass, or lump    Past Surgical History:  Past Surgical History:  Procedure Laterality Date   CARDIOVERSION N/A 02/05/2019   Procedure: CARDIOVERSION;  Surgeon: Josue Hector, MD;  Location: [facility];  Service: Cardiovascular;  Laterality: N/A;   CERVICAL DISC SURGERY     IR CT HEAD LTD  03/12/2021   IR PERCUTANEOUS ART THROMBECTOMY/INFUSION INTRACRANIAL INC DIAG ANGIO  03/12/2021   IR US GUIDE VASC ACCESS RIGHT  03/15/2021   LEG SURGERY     right leg and ankle   NECK SURGERY     cyst removed   RADIOLOGY WITH ANESTHESIA N/A 03/12/2021   Procedure: RADIOLOGY WITH ANESTHESIA;  Surgeon: Radiologist, Medication, MD;  Location: [facility];  Service: Radiology;  Laterality: N/A;   TEE WITHOUT CARDIOVERSION N/A 02/05/2019   Procedure: TRANSESOPHAGEAL ECHOCARDIOGRAM (TEE);  Surgeon: Josue Hector, MD;  Location: [facility] ENDOSCOPY;  Service: Cardiovascular;  Laterality: N/A;   TOTAL HIP ARTHROPLASTY     left   Social History:  reports that he has been smoking. He has never used smokeless tobacco. He reports current alcohol use. He reports that he does not use drugs.  Family / Support Systems Marital Status: Married Patient Roles: Parent, Spouse Spouse/Significant Other: Pt reports he is currently going through a divorce. Children: Two adult sons- Patrick Padilla and Patrick Padilla Other Supports: His son Patrick Padilla's girlfriend Patrick Padilla Anticipated Caregiver: son Patrick Padilla and his girlfriend Ability/Limitations of Caregiver: Patrick Padilla works during the day, and his gr Patrick Padilla can only provide  supervision level of care Caregiver Availability: Intermittent Family Dynamics: Pt lives with his son Patrick Padilla, his girlfriend,a nd 3 y.p. Patrick Padilla.  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, manufacturing: [employer] - How often do you need to have someone help you when you read instructions, pamphlets, or other written material from your doctor or pharmacy?: Never Writes: Yes Employment Status: Employed Name of Employer: Self employed- Therapist, occupational of Employment: 15 Return to Work Plans: TBD Public relations account executive Issues: Denies Guardian/Conservator: N/A   Abuse/Neglect Abuse/Neglect Assessment Can Be Completed: Yes Physical Abuse: Denies Verbal Abuse: Denies Sexual Abuse: Denies Exploitation of patient/patient's resources: Denies Self-Neglect: Denies  Patient response to: Social Isolation - How often do you feel lonely or isolated from those around you?: Never  Emotional Status Pt's affect, behavior and adjustment status: Pt in good spirits at time of visit. Pt complained about left wrist pain. Pt also HOH. States both ears give him trouble but primarily left ear. Recent Psychosocial Issues: Denies Psychiatric History: ADmits to taking Paxil to help manage his anxiety, prescribed by PCP. Substance Abuse History: Admits he smokes 10 cigarettes per day; has b een smoking for 37 years. Admits to one glass of a [place] drink with Dr.Pepper nightly. Dneies rec drug use.  Patient / Family Perceptions, Expectations & Goals Pt/Family understanding of illness & functional limitations: Pt appears to have a general understanding of care needs Premorbid pt/family roles/activities: Independent Anticipated changes in roles/activities/participation:  Assistance with ADLs/IADLs Pt/family expectations/goals: Pt goal is to "get wrist straightened our  since it hurts."  [employer]: None Premorbid Home Care/DME Agencies: None Transportation available at discharge: TBD Is the patient able to respond to transportation needs?: Yes In the past 12 months, has lack of transportation kept you from medical appointments or from getting medications?: No In the past 12 months, has lack of transportation kept you from meetings, work, or from getting things needed for daily living?: No Resource referrals recommended: Neuropsychology  Discharge Planning Living Arrangements: Children, Non-relatives/Friends Support Systems: Children, Other relatives Type of Residence: Private residence Insurance Resources: Chartered certified accountant Resources: Employment Financial Screen Referred: No Living Expenses: Medical laboratory scientific officer Management: Patient Does the patient have any problems obtaining your medications?: No Home Management: Pt reported he managed his home care needs Patient/Family Preliminary Plans: TBD Care Coordinator Barriers to Discharge: Decreased caregiver support, Lack of/limited family support Care Coordinator Barriers to Discharge Comments: son works days and son's girlfriend can only provide supervision Care Coordinator Anticipated Follow Up Needs: HH/OP  Clinical Impression SW met with pt in room to introduce self, explain role, and discuss discharge process. Pt is not a English as a second language teacher. No HCPOA. No DME. Pt aware SW to follow-up with his son Patrick Padilla.  [id number] left message for pt son Patrick Padilla [phone]) to introduce self, explain role, and inform will f/u on Tuesday after team conference.   Cashius Grandstaff A Kenyon Eichelberger 03/18/2021, 11:37 AM

## 2021-03-18 NOTE — Evaluation (Signed)
Physical Therapy Assessment and Plan  Patient Details  Name: Patrick Padilla MRN: [id number] Date of Birth: 1955/12/29  PT Diagnosis: Abnormal posture, Abnormality of gait, Cognitive deficits, Difficulty walking, Edema, Hemiplegia non-dominant, Hypotonia, Low back pain, Muscle spasms, Muscle weakness, and Pain in joint Rehab Potential: Good ELOS: 3 weeks   Today's Date: 03/18/2021 PT Individual Time:1300-1405 PT Individual Time Calculation (Min): 35 min  Hospital Problem: Principal Problem:   Acute ischemic right MCA stroke ([place])   Past Medical History:  Past Medical History:  Diagnosis Date   Anxiety    Elevated LFTs    History of colon polyps    HLD (hyperlipidemia)    HTN (hypertension)    Localized superficial swelling, mass, or lump    Past Surgical History:  Past Surgical History:  Procedure Laterality Date   CARDIOVERSION N/A 02/05/2019   Procedure: CARDIOVERSION;  Surgeon: Josue Hector, MD;  Location: [place];  Service: Cardiovascular;  Laterality: N/A;   CERVICAL DISC SURGERY     IR CT HEAD LTD  03/12/2021   IR PERCUTANEOUS ART THROMBECTOMY/INFUSION INTRACRANIAL INC DIAG ANGIO  03/12/2021   IR US GUIDE VASC ACCESS RIGHT  03/15/2021   LEG SURGERY     right leg and ankle   NECK SURGERY     cyst removed   RADIOLOGY WITH ANESTHESIA N/A 03/12/2021   Procedure: RADIOLOGY WITH ANESTHESIA;  Surgeon: Radiologist, Medication, MD;  Location: [place];  Service: Radiology;  Laterality: N/A;   TEE WITHOUT CARDIOVERSION N/A 02/05/2019   Procedure: TRANSESOPHAGEAL ECHOCARDIOGRAM (TEE);  Surgeon: Josue Hector, MD;  Location[facility] ENDOSCOPY;  Service: Cardiovascular;  Laterality: N/A;   TOTAL HIP ARTHROPLASTY     left    Assessment & Plan Clinical Impression: Patient is a 65 y.o. year old RH-male with history of A fib s/p CV in the past, ETOH abuse who activated EMS with reports of confusion, CP and diaphoresis, was found to be in A fib with RVR and CV attempts enroute to ED. He was  hemiplegic with RUE jerking moments, was obtundent with hypoxic respiratory failure requiring intubation in ED. CTP showed R-MCA infarct with 11 mm core and 165 ml penumbra. He underwent emergent cerebral angio with thrombectomy  R-M1/MCA occlusion with complete recanalization by Dr. Karenann Cai. EEG done negative for epileptiform activity. He was placed on Cleveprex for BP management and started on Keppra due to concerns of seizure. LTM-EEG showed diffuse encephalopathy without seizure or epileptiform discharges and he tolerated extubation by 09/05. Mentation has been improving with increase in movement on the left and has had bouts of agitation felt to be due to ETOH withdrawal therefore started on CIWA protocol.   Dr. Erlinda Hong felt that stroke was embolic due to A fib s/p cardioversion and follow up MRI head showed acute infarct in right basal ganglia, few punctate bifrontal peripheral infarcts and recent left thalamic infarct question subacute as well as PRES appearance in bilateral parieto-occipital cortex and small volume blood products at right sylvian fissure. He was started on ASA due to moderate size stroke with petechial hemorrhage. He has had bouts of A fib and started on IV heparin and transitioned to Eliquis. He did have rise in WBC to 20.6 with 1/2 BC positive for staph epi felt to be contaminant and leucocytosis has resolved, AKI with oliguria without signs of retention--SCr 1.37-->0.76.  He also reported fall with left shoulder pain and Xrays of left shoulder negative for Fx.  He continues to be limited by left  sided weakness with fatigue, mild left inattention, cognitive deficits,  and lack of awareness of deficits. CIR recommended due to functional decline.  Patient transferred to CIR on 03/17/2021 .   Patient currently requires max with mobility secondary to muscle weakness and muscle joint tightness, decreased cardiorespiratoy endurance, impaired timing and sequencing, abnormal tone,  decreased coordination, and decreased motor planning, decreased visual acuity, decreased visual perceptual skills, and decreased visual motor skills, decreased midline orientation and decreased attention to left, decreased attention, decreased awareness, decreased problem solving, decreased safety awareness, decreased memory, and delayed processing, and decreased sitting balance, decreased standing balance, decreased postural control, hemiplegia, decreased balance strategies, and difficulty maintaining precautions.  Prior to hospitalization, patient was independent  with mobility and lived with Son, Family in a House home.  Home access is 1Stairs to enter.  Patient will benefit from skilled PT intervention to maximize safe functional mobility, minimize fall risk, and decrease caregiver burden for planned discharge home with 24 hour assist.  Anticipate patient will benefit from follow up [facility] at discharge.  PT - End of Session Activity Tolerance: Tolerates 30+ min activity with multiple rests Endurance Deficit: Yes PT Assessment Rehab Potential (ACUTE/IP ONLY): Good PT Barriers to Discharge: [place] home environment;Incontinence;Behavior PT Patient demonstrates impairments in the following area(s): Balance;Nutrition;Skin Integrity;Pain;Behavior;Edema;Perception;Safety;Endurance;Motor;Sensory PT Transfers Functional Problem(s): Bed Mobility;Bed to Chair;Car;Furniture PT Locomotion Functional Problem(s): Ambulation;Wheelchair Mobility;Stairs PT Plan PT Intensity: Minimum of 1-2 x/day ,45 to 90 minutes PT Frequency: 5 out of 7 days PT Duration Estimated Length of Stay: 3 weeks PT Treatment/Interventions: Ambulation/gait training;Balance/vestibular training;Community reintegration;Cognitive remediation/compensation;Discharge planning;Disease management/prevention;Functional electrical stimulation;DME/adaptive equipment instruction;Functional mobility training;Neuromuscular re-education;Patient/family  education;Pain management;Psychosocial support;Splinting/orthotics;Therapeutic Activities;UE/LE Strength taining/ROM;Visual/perceptual remediation/compensation;Wheelchair propulsion/positioning;UE/LE Coordination activities;Therapeutic Exercise;Stair training;Skin care/wound management PT Transfers Anticipated Outcome(s): Supervision using LRAD PT Recommendation Recommendations for Other Services: Neuropsych consult Follow Up Recommendations: Home health PT Patient destination: Home Equipment Recommended: To be determined   PT Evaluation Precautions/Restrictions Precautions Precautions: Fall Precaution Comments: impulsive Restrictions Other Position/Activity Restrictions: Edema and pain with weight bearing for R knee and L wrist Pain Pain Assessment Pain Scale: Faces Faces Pain Scale: Hurts even more Pain Location: Knee Pain Orientation: Right Pain Intervention(s): RN made aware;MD notified (Comment);Distraction;Massage;Rest;Repositioned Pain Interference Pain Interference Pain Effect on Sleep: 8. Unable to answer (Patient unable to answer appropriately due to cognitive deficits) Pain Interference with Therapy Activities: 8. Unable to answer Pain Interference with Day-to-Day Activities: 8. Unable to answer Home Living/Prior Functioning Home Living Available Help at Discharge: Family (dtr in law always home) Type of Home: House Home Access: Stairs to enter [employer] of Steps: 1 Entrance Stairs-Rails: Left Home Layout: One level Additional Comments: Taken from patient's chart, patient confirmed information when read to him, unable to provide on his own, need to confirm with family  Lives With: Son;Family Prior Function Level of Independence: Independent with basic ADLs;Independent with homemaking with ambulation;Independent with gait  Able to Take Stairs?: Yes Comments: pt was sedentary and spent most of his days in a recliner however was independent with mobility  and ADL, per pt chart Vision/Perception  Vision - Assessment Eye Alignment: Within Functional Limits Ocular Range of Motion: Within Functional Limits Alignment/Gaze Preference: Head turned (to the R) Tracking/Visual Pursuits: Requires cues, head turns, or add eye shifts to track Saccades: Impaired - to be further tested in functional context Perception Perception: Impaired Inattention/Neglect: Does not attend to left side of body;Does not attend to left visual field Praxis Praxis: Impaired Praxis Impairment Details: Motor planning  Cognition Overall Cognitive Status:  Impaired/Different from baseline Arousal/Alertness: Awake/alert Orientation Level: Oriented to person;Oriented to place;Oriented to situation;Disoriented to time Year: 2019 Month: September Day of Week: Incorrect Attention: Sustained Focused Attention: Appears intact Sustained Attention: Impaired Sustained Attention Impairment: Functional basic Memory: Impaired Memory Impairment: Storage deficit Immediate Memory Recall: Sock;Blue;Bed Memory Recall Sock: Not able to recall Memory Recall Blue: Not able to recall Memory Recall Bed: Not able to recall Awareness: Impaired Awareness Impairment: Intellectual impairment Problem Solving: Impaired Problem Solving Impairment: Functional basic Self Monitoring: Impaired Self Monitoring Impairment: Functional basic Behaviors: Impulsive Safety/Judgment: Impaired Sensation Sensation Light Touch: Appears Intact (responds to pain and light touch, unable to formally test due to cognitive deficits) Coordination Gross Motor Movements are Fluid and Coordinated: No Fine Motor Movements are Fluid and Coordinated: No Coordination and Movement Description: edema in L first MCP and wrist with pain as well as R knee with instability, L hemiplegia Heel Shin Test: unable to follow cues/demonstration Motor  Motor Motor: Hemiplegia Motor - Skilled Clinical Observations: L hemi    Trunk/Postural Assessment  Cervical Assessment Cervical Assessment: Exceptions to [facility] (forward head) Thoracic Assessment Thoracic Assessment: Exceptions to [facility] (rounded shoulders) Lumbar Assessment Lumbar Assessment: Exceptions to [facility] (posterior pelvic tilt) Postural Control Postural Control: Deficits on evaluation (righting reaction)  Balance Balance Balance Assessed: Yes Standardized Balance Assessment Standardized Balance Assessment: Berg Balance Test Berg Balance Test Sit to Stand: Needs moderate or maximal assist to stand Standing Unsupported: Unable to stand 30 seconds unassisted Sitting with Back Unsupported but Feet Supported on Floor or Stool: Able to sit 10 seconds Stand to Sit: Needs assistance to sit Transfers: Needs one person to assist Standing Unsupported with Eyes Closed: Needs help to keep from falling Standing Ubsupported with Feet Together: Needs help to attain position and unable to hold for 15 seconds From Standing, Reach Forward with Outstretched Arm: Loses balance while trying/requires external support From Standing Position, Pick up Object from Floor: Unable to try/needs assist to keep balance From Standing Position, Turn to Look Behind Over each Shoulder: Needs assist to keep from losing balance and falling Turn 360 Degrees: Needs assistance while turning Standing Unsupported, Alternately Place Feet on Step/Stool: Needs assistance to keep from falling or unable to try Standing Unsupported, One Foot in Front: Loses balance while stepping or standing Standing on One Leg: Unable to try or needs assist to prevent fall Total Score: 2 Static Sitting Balance Static Sitting - Level of Assistance: 5: Stand by assistance;4: Min assist Dynamic Sitting Balance Dynamic Sitting - Balance Support: No upper extremity supported;Feet supported Dynamic Sitting - Level of Assistance: 4: Min assist;3: Mod assist Static Standing Balance Static Standing - Level of  Assistance: 2: Max assist Extremity Assessment  RLE Assessment RLE Assessment: Exceptions to [facility] Active Range of Motion (AROM) Comments: R knee with very limited ROM with hamstrings in tension/muscle guarding, lacking 15-25 deg from neutral extension and limited to 30-40 deg of flexion General Strength Comments: Unable to formally assess due to knee pain, DF/PF grossly at least 4/5, hip flexion 4/5 LLE Assessment LLE Assessment: Exceptions to [facility] Active Range of Motion (AROM) Comments: WFL General Strength Comments: Grossly at least 3+/5 throughout except DF 1/5, limited formal assessment due to poor ability to follow cues correctly  Care Tool Care Tool Bed Mobility Roll left and right activity   Roll left and right assist level: Moderate Assistance - Patient 50 - 74%    Sit to lying activity   Sit to lying assist level: Minimal Assistance - Patient >  75%    Lying to sitting on side of bed activity   Lying to sitting on side of bed assist level: the ability to move from lying on the back to sitting on the side of the bed with no back support.: Moderate Assistance - Patient 50 - 74%     Care Tool Transfers Sit to stand transfer   Sit to stand assist level: Maximal Assistance - Patient 25 - 49%    Chair/bed transfer   Chair/bed transfer assist level: Moderate Assistance - Patient 50 - 74%     Psychologist, counselling transfer activity did not occur: Safety/medical concerns (R knee pain and instability with mobility)        Care Tool Locomotion Ambulation Ambulation activity did not occur: Safety/medical concerns        Walk 10 feet activity Walk 10 feet activity did not occur: Safety/medical concerns       Walk 50 feet with 2 turns activity Walk 50 feet with 2 turns activity did not occur: Safety/medical concerns      Walk 150 feet activity Walk 150 feet activity did not occur: Safety/medical concerns      Walk 10 feet on uneven surfaces activity Walk  10 feet on uneven surfaces activity did not occur: Safety/medical concerns      Stairs Stair activity did not occur: Safety/medical concerns        Walk up/down 1 step activity Walk up/down 1 step or curb (drop down) activity did not occur: Safety/medical concerns     Walk up/down 4 steps activity did not occuR: Safety/medical concerns  Walk up/down 4 steps activity      Walk up/down 12 steps activity Walk up/down 12 steps activity did not occur: Safety/medical concerns      Pick up small objects from floor Pick up small object from the floor (from standing position) activity did not occur: Safety/medical concerns      Wheelchair     Wheelchair activity did not occur: Safety/medical concerns (L wrist edema and pain, awaiting x-ray results, NWB during eval)      Wheel 50 feet with 2 turns activity Wheelchair 50 feet with 2 turns activity did not occur: Safety/medical concerns    Wheel 150 feet activity Wheelchair 150 feet activity did not occur: Safety/medical concerns      Refer to Care Plan for Long Term Goals  SHORT TERM GOAL WEEK 1 PT Short Term Goal 1 (Week 1): Patient will perform bed mobility with min A consistently. PT Short Term Goal 2 (Week 1): Patient with perform basic transfers with min A consistently. PT Short Term Goal 3 (Week 1): Patient will perform static sitting balance with supervision in midline >2 min.  Recommendations for other services: Neuropsych  Skilled Therapeutic Intervention Evaluation completed (see details above and below) with education on PT POC and goals and individual treatment initiated with focus on functional mobility/transfers, LE strength, dynamic standing balance/coordination, ambulation, stair navigation, simulated car transfers, and improved endurance with activity Patient provided with 20"x18" wheelchair with foam cushion and adjustments made to promote optimal seating posture and pressure distribution. Patient also provided with RW  and slide board for use in room and therapist adjusted to proper height for patient.  Patient in bed upon PT arrival. Patient alert and agreeable to PT session. Patient expressed pain with crying out and grimacing in his R knee and L wrist with increased edema noted to both areas. Both  were tender to touch with no increased erythema, change in color, or change in temperature. Noted R hamstrings in tension with muscle guarding and R knee instability in standing with indeterminate results on ligament testing due to pain and muscle guarding, however, increased anterior tibial translation when compared to the L.   Therapeutic Activity: Bed Mobility: Patient performed rolling R/L with min/mod A and supine to/from sit with mod A for trunk support to come to sitting and min A for lower extremity assist to return to lying x2 in a flat bed without use of bed rails. Patient came to sitting a second time with supervision, spontaneously demonstrating increased impulsivity when reporting he needed to void. Patient perseverated on need to void throughout session. Patient with condom catheter in place and encouraged to use the catheter with poor comprehension of instructions.  Patient sat EOB x2 progressing from min A to supervision/CGA, able to maintain supervision several times 5-10 sec. Focused on midline orientation and reciprocal scooting in sitting wit facilitation to pelvis, ribs, and chest. Transfers: Patient performed sit to/from stand x2 with mod-max A, maintained crouched posture with R lean due to increased R knee flexion and buckling. Provided verbal cues for scooting forward, foot placement, forward weight shift, and hip/knee/trunk extension. Patient maintained standing balance 10-20 sec each trial, limited by R knee pain. Patient performed lateral scoot bed>chair with mod-max A and slide board transfer w/c>bed with min A and total A for board placement. Provided cues for hand placement, board placement, and  head-hips relationship for proper technique and decreased assist with transfers.   Instructed pt in results of PT evaluation as detailed above, PT POC, rehab potential, rehab goals, and discharge recommendations. Additionally discussed CIR's policies regarding fall safety and use of chair alarm and/or quick release belt. Pt verbalized understanding and in agreement. Will update pt's family members as they become available.   Patient in bed at end of session with breaks locked, bed alarm set, and all needs within reach.    Discharge Criteria: Patient will be discharged from PT if patient refuses treatment 3 consecutive times without medical reason, if treatment goals not met, if there is a change in medical status, if patient makes no progress towards goals or if patient is discharged from hospital.  The above assessment, treatment plan, treatment alternatives and goals were discussed and mutually agreed upon: No family available/patient unable  Jeffrey Voth L Kiari Hosmer PT, DPT  03/18/2021, 3:52 PM

## 2021-03-18 NOTE — Progress Notes (Signed)
Inpatient Rehabilitation  Patient information reviewed and entered into eRehab system by Jordin Dambrosio M. Cloys Vera, M.A., CCC/SLP, PPS Coordinator.  Information including medical coding, functional ability and quality indicators will be reviewed and updated through discharge.    

## 2021-03-19 DIAGNOSIS — M25461 Effusion, right knee: Secondary | ICD-10-CM | POA: Diagnosis not present

## 2021-03-19 DIAGNOSIS — I6783 Posterior reversible encephalopathy syndrome: Secondary | ICD-10-CM | POA: Diagnosis not present

## 2021-03-19 DIAGNOSIS — I10 Essential (primary) hypertension: Secondary | ICD-10-CM | POA: Diagnosis not present

## 2021-03-19 DIAGNOSIS — M109 Gout, unspecified: Secondary | ICD-10-CM | POA: Diagnosis not present

## 2021-03-19 DIAGNOSIS — D72829 Elevated white blood cell count, unspecified: Secondary | ICD-10-CM | POA: Diagnosis not present

## 2021-03-19 DIAGNOSIS — I63511 Cerebral infarction due to unspecified occlusion or stenosis of right middle cerebral artery: Secondary | ICD-10-CM | POA: Diagnosis not present

## 2021-03-19 LAB — CBC
HCT: 42.7 % (ref 39.0–52.0)
Hemoglobin: 15.1 g/dL (ref 13.0–17.0)
MCH: 34.9 pg — ABNORMAL HIGH (ref 26.0–34.0)
MCHC: 35.4 g/dL (ref 30.0–36.0)
MCV: 98.6 fL (ref 80.0–100.0)
Platelets: 269 10*3/uL (ref 150–400)
RBC: 4.33 MIL/uL (ref 4.22–5.81)
RDW: 12.9 % (ref 11.5–15.5)
WBC: 16 10*3/uL — ABNORMAL HIGH (ref 4.0–10.5)
nRBC: 0 % (ref 0.0–0.2)

## 2021-03-19 LAB — MAGNESIUM: Magnesium: 2 mg/dL (ref 1.7–2.4)

## 2021-03-19 MED ORDER — PREDNISONE 20 MG PO TABS
20.0000 mg | ORAL_TABLET | Freq: Two times a day (BID) | ORAL | Status: AC
  Administered 2021-03-19 – 2021-03-20 (×4): 20 mg via ORAL
  Filled 2021-03-19 (×4): qty 1

## 2021-03-19 MED ORDER — AMLODIPINE BESYLATE 5 MG PO TABS
5.0000 mg | ORAL_TABLET | Freq: Every day | ORAL | Status: DC
  Administered 2021-03-19 – 2021-03-21 (×3): 5 mg via ORAL
  Filled 2021-03-19 (×2): qty 1

## 2021-03-19 NOTE — Progress Notes (Signed)
Physical Therapy Session Note  Patient Details  Name: Patrick Padilla MRN: [id number] Date of Birth: 1955/12/07  Today's Date: 03/19/2021 PT Individual Time:  1002-1116  PT Individual Time Calculation (min): 74 min    Short Term Goals: Week 1:  PT Short Term Goal 1 (Week 1): Patient will perform bed mobility with min A consistently. PT Short Term Goal 2 (Week 1): Patient with perform basic transfers with min A consistently. PT Short Term Goal 3 (Week 1): Patient will perform static sitting balance with supervision in midline >2 min.  Skilled Therapeutic Interventions/Progress Updates:  Patient supine in bed asleep on entrance to room. Patient takes extensive time and encouragement before arousal and becoming fully alert and agreeable to PT session.   Patient relates mild pain complaint throughout session in R knee that increases with effort in rising to stand but decreases once in standing. Addressed with repositioning and with increase of start angle with rise to stand from higher surface to decrease tension and torque on R knee joint.   Therapeutic Activity: Bed Mobility: Patient performed supine --> shortsitting with CGA into position with LLE off side of bed and RLE in knee flexion. Continuous cues throughout for attn to task and continuing movements toward reaching goal of sitting EOB. Continuous cues and eventual Min A to reach bil feet to floor on EOB. Sitting balance activity performed for NMR described below.   Transfers: Patient performed sit<>stand transfers throughout session from EOB to RW with Max A and improving to Mod A from increased bed height and cues to lean toward L side with therapist assist to decrease R knee pain during quad activation in rise to stand. Once standing, pt relates decreased pain in R knee. Provided verbal cues for technique throughout in order to assist with technique and pain reduction in performance.  Neuromuscular Re-ed: NMR facilitated during session with  focus on sitting balance and standing balance/ pre gait training. Pt guided in seated reaches anteriorly and to each side with difficulty in return to upright position with reach to L>R sides. Elbows to lateral bed surface with improved performance R over L. Difficulty with performance of heel to shin d/t pain and weakness/ flexibility. Guided inheel touches with knee extension to toe touches with knee flexion and improved performance bilaterally and improved overall seated balance. Progressed to standing with initial stance and weight shift toward LLE in order to decrease R knee pain. During second standing bout , pt able to progress to lateral weight shift side to side for ~ 1 minute.  NMR performed for improvements in motor control and coordination, balance, sequencing, judgement, and self confidence/ efficacy in performing all aspects of mobility at highest level of independence.   Has hallucinations during session where he is reaching out to clean up hair and also relates water going into the bottom of the dresser despite therapist observing that water is actually reflection in floor of dresser. Tells therapist that in fact, that is water.   Patient supine  in bed at end of session with brakes locked, bed alarm set, and all needs within reach. Almost immediate return to sleep upon neutral bed alignment and positioning.    Therapy Documentation Precautions:  Precautions Precautions: Fall Precaution Comments: impulsive Restrictions Weight Bearing Restrictions: No Other Position/Activity Restrictions: Edema and pain with weight bearing for R knee and L wrist General:   Vital Signs:  Pain: Pt c/o mild to moderate pain in R knee and briefly in wrist. Addressed with repositioning.  Therapy/Group: Individual Therapy  Loel Dubonnet PT, DPT 03/19/2021, 10:17 AM

## 2021-03-19 NOTE — Progress Notes (Signed)
Occupational Therapy Session Note  Patient Details  Name: Patrick Padilla MRN: [id number] Date of Birth: Aug 10, 1955  Today's Date: 03/19/2021 OT Individual Time: 7530-0511 OT Individual Time Calculation (min): 57 min   Today's Date: 03/19/2021 OT Individual Time: 0211-1735 OT Individual Time Calculation (min): 45 min   Short Term Goals: Week 1:  OT Short Term Goal 1 (Week 1): Pt will sit EOB with Supervision dynamically for 5 min OT Short Term Goal 2 (Week 1): Pt will don shirt wiht MIN A OT Short Term Goal 3 (Week 1): Pt will thread 1LE into pants wiht A for sitting balance only OT Short Term Goal 4 (Week 1): Pt will locate grooming items at sink with no cuing  Skilled Therapeutic Interventions/Progress Updates:    Session 1:  Pt received in bed with unrated pain in R knee and L hand. Repositioning and rest provided. MD and RN aware. MD reporting may be gout and will begin tx.  ADL:  Vitals throughout: 158/98 supine HR 112-117 O2 97 EOB 170/116 HR up to low 130s with sitting EOB O2 98 RN alerted and to provide medicaitons. Pt returned back to supine with HOB elevated to eat breakfast with full supervision   Pt reporting cobwebs in vision "this stuff is amazing" Pt reporting visual hallucination of a kitten in room, when explained visual hallucination, pt stated, "no, I petted him" pt then reporting "you know theres a person behind that wall"  Skilled facilitation of self feeding and grooming this date with all items placed on L for L attention. Pt demo R lean even in supported sitting with pillows. Pt requires cuing for small bites and sips as well as visual scanning at tabletop for locating necessary items for self feeding/grooming. Pt continues to be internaly distracted and distracted by visual hallucinations requiring reorientation/cuing for pacing to decrease oral holding.   Pt left at end of session in bed with pillows positioned to faciliate midline orinetation in bed at trunk and  head with TV positioned on L of patient with exit alarm on, call light in reach and all needs met  Session 2:  Pt received in bed asleep requiring increased time to arouse. Pt with increased redness and swelling in hand and wrist but eventually reporting that it feels better than yesterday.  ADL:  OT sets up all needed items at far L and mirror in front of pt at EOB for ADL. Pt WB into L elbow reaching with RUE to obtain all items for ADL. Pt requires max multimodal cuing with improved pt alignment. Pt able to maintain sitting balance with MIN A Overall and tactile cuing at pelvis and collar bone for neutral pelvic tilt/posture. Pt able to don shirt with A for sitting balance only. Pt completes scoots up to [facility] with MOD A with ipmroved weight shift forwrad to clear buttocks off bed.  Therapeutic activity Pt positioned on L side for elongation of R neck muscles and falls asleep quickly. Rearranged room to improve Pt attention to L with TV on L and visitor recliner on L with sign on it stating, "when visiting with pt sit here and have him turn his head to look/talk to you"  BP much improved since this morning. No reports of visual hallucinations HOB elevated 130/77   Pt left at end of session in bed with exit alarm on, call light in reach and all needs met   Therapy Documentation Precautions:  Precautions Precautions: Fall Precaution Comments: impulsive Restrictions Weight Bearing Restrictions:  No Other Position/Activity Restrictions: Edema and pain with weight bearing for R knee and L wrist   Therapy/Group: individual  Tonny Branch 03/19/2021, 6:49 AM

## 2021-03-19 NOTE — Progress Notes (Signed)
PROGRESS NOTE   Subjective/Complaints: Right knee and left wrist remain sore. Hallucinating last night and this morning  ROS: Limited due to cognitive/behavioral    Objective:   DG Wrist Complete Left  Result Date: 03/18/2021 CLINICAL DATA:  Pain on the radial side of LEFT wrist at second and third MCP joints by report. 65 year old male. EXAM: LEFT WRIST - COMPLETE 3+ VIEW COMPARISON:  None FINDINGS: Mild degenerative changes about the wrist in the first [facility] and radiocarpal joint. No significant soft tissue swelling. No sign of fracture or dislocation. IMPRESSION: Mild degenerative changes without acute finding. Electronically Signed   By: Donzetta Kohut M.D.   On: 03/18/2021 14:26   DG Knee Complete 4 Views Right  Result Date: 03/17/2021 CLINICAL DATA:  Right knee pain. EXAM: RIGHT KNEE - COMPLETE 4+ VIEW COMPARISON:  None. FINDINGS: Normal alignment. Normal joint spaces. No fracture. There is a moderate knee joint effusion. No erosion, bone destruction, or focal bone abnormality. Mild medial soft tissue edema. IMPRESSION: Moderate knee joint effusion. No acute osseous abnormality. Medial soft tissue edema. Electronically Signed   By: Narda Rutherford M.D.   On: 03/17/2021 21:29   Recent Labs    03/18/21 0508 03/19/21 0515  WBC 15.9* 16.0*  HGB 15.5 15.1  HCT 44.9 42.7  PLT 253 269   Recent Labs    03/17/21 0018 03/18/21 0508  NA 138 134*  K 3.5 3.2*  CL 105 100  CO2 25 23  GLUCOSE 99 116*  BUN 9 9  CREATININE 0.76 0.88  CALCIUM 8.7* 8.6*    Intake/Output Summary (Last 24 hours) at 03/19/2021 1006 Last data filed at 03/18/2021 1900 Gross per 24 hour  Intake 236 ml  Output 150 ml  Net 86 ml        Physical Exam: Vital Signs Blood pressure (!) 151/92, pulse (!) 110, temperature 98.1 F (36.7 C), resp. rate 18, height 6' (1.829 m), weight 95.7 kg, SpO2 95 %.  Constitutional: No distress . Vital signs  reviewed. HEENT: NCAT, EOMI, oral membranes moist Neck: supple Cardiovascular: RRR without murmur. No JVD    Respiratory/Chest: CTA Bilaterally without wheezes or rales. Normal effort    GI/Abdomen: BS +, non-tender, non-distended Ext: no clubbing, cyanosis, or edema Psych: pleasant but confused. hallucinating Skin: some excoriation of gluteal cleft Neuro:  alert, follows basic commands with delay. Oriented to person, hospital. Leans to right when at EOB. RUE 5/5. LUE 3/5. LLE 3/5 RLE limited still by knee pain Musculoskeletal: right knee and left wrist both warm with effusion, redness    Assessment/Plan: 1. Functional deficits which require 3+ hours per day of interdisciplinary therapy in a comprehensive inpatient rehab setting. Physiatrist is providing close team supervision and 24 hour management of active medical problems listed below. Physiatrist and rehab team continue to assess barriers to discharge/monitor patient progress toward functional and medical goals  Care Tool:  Bathing    Body parts bathed by patient: Left arm, Chest, Abdomen, Front perineal area, Face   Body parts bathed by helper: Right arm, Buttocks, Right upper leg, Left upper leg, Right lower leg, Left lower leg     Bathing assist Assist Level: Maximal  Assistance - Patient 24 - 49% (MAX A STS)     Upper Body Dressing/Undressing Upper body dressing   What is the patient wearing?: Hospital gown only    Upper body assist Assist Level: Minimal Assistance - Patient > 75%    Lower Body Dressing/Undressing Lower body dressing      What is the patient wearing?:  (pt not wearing any brief)     Lower body assist Assist for lower body dressing: 2 Helpers     Toileting Toileting    Toileting assist Assist for toileting: 2 Helpers     Transfers Chair/bed transfer  Transfers assist  Chair/bed transfer activity did not occur: Safety/medical concerns  Chair/bed transfer assist level: Moderate  Assistance - Patient 50 - 74%     Locomotion Ambulation   Ambulation assist   Ambulation activity did not occur: Safety/medical concerns          Walk 10 feet activity   Assist  Walk 10 feet activity did not occur: Safety/medical concerns        Walk 50 feet activity   Assist Walk 50 feet with 2 turns activity did not occur: Safety/medical concerns         Walk 150 feet activity   Assist Walk 150 feet activity did not occur: Safety/medical concerns         Walk 10 feet on uneven surface  activity   Assist Walk 10 feet on uneven surfaces activity did not occur: Safety/medical concerns         Wheelchair     Assist     Wheelchair activity did not occur: Safety/medical concerns (L wrist edema and pain, awaiting x-ray results, NWB during eval)         Wheelchair 50 feet with 2 turns activity    Assist    Wheelchair 50 feet with 2 turns activity did not occur: Safety/medical concerns       Wheelchair 150 feet activity     Assist  Wheelchair 150 feet activity did not occur: Safety/medical concerns       Blood pressure (!) 151/92, pulse (!) 110, temperature 98.1 F (36.7 C), resp. rate 18, height 6' (1.829 m), weight 95.7 kg, SpO2 95 %.  Medical Problem List and Plan: 1.  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  Antithrombotics: -DVT/anticoagulation:  Pharmaceutical: Other (comment)--transitioned to Eliquis on 09/08             -antiplatelet therapy: N/A 3. Left shoulder/right knee pain/Pain Management: Local measures with Voltaren gel/ice/heat. Favors knee in weight bearing             --xrays demonstrates effusion seen on exam.   -uric acid nl but these clinically appear to be gout. When I asked him whether he's had gout before, he said he had -will  initiate prednisone 20mg  bid today  -pt denied major knee pain PTA 4. Mood: LCSW to follow for evaluation and support.              -antipsychotic agents: N/A 5. Neuropsych: This patient is not  capable of making decisions on his own behalf. 6. Skin/Wound Care: Routine pressure relief measures.  7. Fluids/Electrolytes/Nutrition: encourage PO -hypokalemia: supplement -protein supp for  low albumin  8. 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              --Monitor HR TID--continue Eliquis and Lopressor  -add norvasc 9. HTN: Monitor BP TID--on avapro, Lopressor,   -needs tighter control, begin norvasc 5mg  daily to help HR also 10. RUE shaking movements: Felt to be postural tremor and Keppra d/c 09/06 11. Leucocytosis: wbc's up to 16 today (had dropped to 11k)  -blood cx staph epi (contaminate) -still no ua and ucx   -suspect gout 12. Hyperlipidemia: Now on Lipitor     LOS: 2 days A FACE TO FACE EVALUATION WAS PERFORMED  11/06 03/19/2021, 10:06 AM

## 2021-03-20 DIAGNOSIS — D72829 Elevated white blood cell count, unspecified: Secondary | ICD-10-CM | POA: Diagnosis not present

## 2021-03-20 DIAGNOSIS — M25461 Effusion, right knee: Secondary | ICD-10-CM | POA: Diagnosis not present

## 2021-03-20 DIAGNOSIS — I6783 Posterior reversible encephalopathy syndrome: Secondary | ICD-10-CM | POA: Diagnosis not present

## 2021-03-20 DIAGNOSIS — M109 Gout, unspecified: Secondary | ICD-10-CM | POA: Diagnosis not present

## 2021-03-20 DIAGNOSIS — I10 Essential (primary) hypertension: Secondary | ICD-10-CM | POA: Diagnosis not present

## 2021-03-20 DIAGNOSIS — I63511 Cerebral infarction due to unspecified occlusion or stenosis of right middle cerebral artery: Secondary | ICD-10-CM | POA: Diagnosis not present

## 2021-03-20 LAB — URINALYSIS, COMPLETE (UACMP) WITH MICROSCOPIC
Bacteria, UA: NONE SEEN
Glucose, UA: NEGATIVE mg/dL
Hgb urine dipstick: NEGATIVE
Ketones, ur: NEGATIVE mg/dL
Leukocytes,Ua: NEGATIVE
Nitrite: NEGATIVE
Protein, ur: NEGATIVE mg/dL
Specific Gravity, Urine: 1.025 (ref 1.005–1.030)
pH: 6 (ref 5.0–8.0)

## 2021-03-20 MED ORDER — PREDNISONE 20 MG PO TABS
20.0000 mg | ORAL_TABLET | Freq: Every day | ORAL | Status: DC
  Administered 2021-03-21 – 2021-03-22 (×2): 20 mg via ORAL
  Filled 2021-03-20 (×2): qty 1

## 2021-03-20 NOTE — Progress Notes (Signed)
PROGRESS NOTE   Subjective/Complaints: Right knee and left wrist feeling better today. Able to sleep last night  ROS: Patient denies fever, rash, sore throat, blurred vision, nausea, vomiting, diarrhea, cough, shortness of breath or chest pain,   headache, or mood change.    Objective:   DG Wrist Complete Left  Result Date: 03/18/2021 CLINICAL DATA:  Pain on the radial side of LEFT wrist at second and third MCP joints by report. 65 year old male. EXAM: LEFT WRIST - COMPLETE 3+ VIEW COMPARISON:  None FINDINGS: Mild degenerative changes about the wrist in the first [facility] and radiocarpal joint. No significant soft tissue swelling. No sign of fracture or dislocation. IMPRESSION: Mild degenerative changes without acute finding. Electronically Signed   By: Donzetta Kohut M.D.   On: 03/18/2021 14:26   Recent Labs    03/18/21 0508 03/19/21 0515  WBC 15.9* 16.0*  HGB 15.5 15.1  HCT 44.9 42.7  PLT 253 269   Recent Labs    03/18/21 0508  NA 134*  K 3.2*  CL 100  CO2 23  GLUCOSE 116*  BUN 9  CREATININE 0.88  CALCIUM 8.6*    Intake/Output Summary (Last 24 hours) at 03/20/2021 0838 Last data filed at 03/20/2021 0630 Gross per 24 hour  Intake 227 ml  Output 900 ml  Net -673 ml        Physical Exam: Vital Signs Blood pressure 136/89, pulse 92, temperature 98.6 F (37 C), temperature source Oral, resp. rate 16, height 6' (1.829 m), weight 95.7 kg, SpO2 97 %.  Constitutional: No distress . Vital signs reviewed. HEENT: NCAT, EOMI, oral membranes moist Neck: supple Cardiovascular: RRR without murmur. No JVD    Respiratory/Chest: CTA Bilaterally without wheezes or rales. Normal effort    GI/Abdomen: BS +, non-tender, non-distended Ext: no clubbing, cyanosis, or edema Psych: pleasant, sl confused, no hallucinations this morning Skin: some excoriation of gluteal cleft Neuro:  alert, follows basic commands with delay. Oriented to  person, hospital. Leans to right when at EOB. RUE 5/5. LUE 3/5. LLE 3+/5 RLE limited somewhat by knee pain Musculoskeletal: right knee and left wrist demonstrate decreased effusion, pain, and warmth    Assessment/Plan: 1. Functional deficits which require 3+ hours per day of interdisciplinary therapy in a comprehensive inpatient rehab setting. Physiatrist is providing close team supervision and 24 hour management of active medical problems listed below. Physiatrist and rehab team continue to assess barriers to discharge/monitor patient progress toward functional and medical goals  Care Tool:  Bathing    Body parts bathed by patient: Left arm, Chest, Abdomen, Front perineal area, Face   Body parts bathed by helper: Right arm, Buttocks, Right upper leg, Left upper leg, Right lower leg, Left lower leg     Bathing assist Assist Level: Maximal Assistance - Patient 24 - 49% (MAX A STS)     Upper Body Dressing/Undressing Upper body dressing   What is the patient wearing?: Hospital gown only    Upper body assist Assist Level: Minimal Assistance - Patient > 75%    Lower Body Dressing/Undressing Lower body dressing      What is the patient wearing?:  (pt not wearing any brief)  Lower body assist Assist for lower body dressing: 2 Helpers     Toileting Toileting    Toileting assist Assist for toileting: 2 Helpers     Transfers Chair/bed transfer  Transfers assist  Chair/bed transfer activity did not occur: Safety/medical concerns  Chair/bed transfer assist level: Moderate Assistance - Patient 50 - 74%     Locomotion Ambulation   Ambulation assist   Ambulation activity did not occur: Safety/medical concerns          Walk 10 feet activity   Assist  Walk 10 feet activity did not occur: Safety/medical concerns        Walk 50 feet activity   Assist Walk 50 feet with 2 turns activity did not occur: Safety/medical concerns         Walk 150 feet  activity   Assist Walk 150 feet activity did not occur: Safety/medical concerns         Walk 10 feet on uneven surface  activity   Assist Walk 10 feet on uneven surfaces activity did not occur: Safety/medical concerns         Wheelchair     Assist     Wheelchair activity did not occur: Safety/medical concerns (L wrist edema and pain, awaiting x-ray results, NWB during eval)         Wheelchair 50 feet with 2 turns activity    Assist    Wheelchair 50 feet with 2 turns activity did not occur: Safety/medical concerns       Wheelchair 150 feet activity     Assist  Wheelchair 150 feet activity did not occur: Safety/medical concerns       Blood pressure 136/89, pulse 92, temperature 98.6 F (37 C), temperature source Oral, resp. rate 16, height 6' (1.829 m), weight 95.7 kg, SpO2 97 %.  Medical Problem List and Plan: 1.  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  Antithrombotics: -DVT/anticoagulation:  Pharmaceutical: Other (comment)--transitioned to Eliquis on 09/08             -antiplatelet therapy: N/A 3. Left shoulder/right knee pain/Pain Management: Local measures with Voltaren gel/ice/heat. Favors knee in weight bearing             --xrays demonstrates effusion seen on exam.   -acute gout right knee and left wrist--responsive to prednisone  -continue 20mg  bid today and then begin 20mg  qd tomorrow -pt denied major knee pain PTA 4. Mood: LCSW to follow for evaluation and support.              -antipsychotic agents: N/A 5. Neuropsych: This patient is not  capable of making decisions on his own behalf. 6. Skin/Wound Care: Routine pressure relief measures.  7. Fluids/Electrolytes/Nutrition: encourage PO -hypokalemia: supplement -protein supp for low albumin  8. 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              --Monitor HR TID--continue Eliquis and Lopressor  -addded norvasc with improvement 9. HTN: Monitor BP TID--on avapro, Lopressor,   -needs tighter control, began norvasc 5mg  daily 9/10 to help HR also 10. RUE shaking movements: Felt to be postural tremor and Keppra d/c 09/06 11. Leucocytosis: wbc's up to 16 9/10 (had dropped to 11k)  -  blood cx staph epi (contaminate) on acute -still no ua and ucx ---re order today -suspect gout is cause, also now on steroids -recheck CBC tomorrow 9/12 12. Hyperlipidemia: Now on Lipitor     LOS: 3 days A FACE TO FACE EVALUATION WAS PERFORMED  Ranelle Oyster 03/20/2021, 8:38 AM

## 2021-03-21 DIAGNOSIS — I63511 Cerebral infarction due to unspecified occlusion or stenosis of right middle cerebral artery: Secondary | ICD-10-CM | POA: Diagnosis not present

## 2021-03-21 LAB — CBC
HCT: 41.3 % (ref 39.0–52.0)
Hemoglobin: 13.8 g/dL (ref 13.0–17.0)
MCH: 34.2 pg — ABNORMAL HIGH (ref 26.0–34.0)
MCHC: 33.4 g/dL (ref 30.0–36.0)
MCV: 102.5 fL — ABNORMAL HIGH (ref 80.0–100.0)
Platelets: 400 10*3/uL (ref 150–400)
RBC: 4.03 MIL/uL — ABNORMAL LOW (ref 4.22–5.81)
RDW: 13.2 % (ref 11.5–15.5)
WBC: 15.7 10*3/uL — ABNORMAL HIGH (ref 4.0–10.5)
nRBC: 0 % (ref 0.0–0.2)

## 2021-03-21 LAB — BASIC METABOLIC PANEL
Anion gap: 8 (ref 5–15)
BUN: 15 mg/dL (ref 8–23)
CO2: 28 mmol/L (ref 22–32)
Calcium: 8.9 mg/dL (ref 8.9–10.3)
Chloride: 104 mmol/L (ref 98–111)
Creatinine, Ser: 0.7 mg/dL (ref 0.61–1.24)
GFR, Estimated: >60 mL/min (ref >60)
Glucose, Bld: 93 mg/dL (ref 70–99)
Potassium: 3.3 mmol/L — ABNORMAL LOW (ref 3.5–5.1)
Sodium: 140 mmol/L (ref 135–145)

## 2021-03-21 LAB — URINE CULTURE: Culture: <10000 — AB

## 2021-03-21 MED ORDER — AMLODIPINE BESYLATE 10 MG PO TABS
10.0000 mg | ORAL_TABLET | Freq: Every day | ORAL | Status: DC
  Administered 2021-03-22 – 2021-04-02 (×12): 10 mg via ORAL
  Filled 2021-03-21 (×12): qty 1

## 2021-03-21 MED ORDER — POTASSIUM CHLORIDE 20 MEQ PO PACK
40.0000 meq | PACK | Freq: Once | ORAL | Status: AC
  Administered 2021-03-21: 40 meq via ORAL
  Filled 2021-03-21: qty 2

## 2021-03-21 NOTE — Progress Notes (Signed)
PROGRESS NOTE   Subjective/Complaints: Seeing cobwebs and bees this weekend. Recognizes that these are not there- not disturbing to him. Reviewed his current medications with him. Discussed that steroids can sometimes cause hallucinations  ROS: Patient denies fever, rash, sore throat, blurred vision, nausea, vomiting, diarrhea, cough, shortness of breath or chest pain,   headache, or mood change. +hallucinations   Objective:   No results found. Recent Labs    03/19/21 0515 03/21/21 0642  WBC 16.0* 15.7*  HGB 15.1 13.8  HCT 42.7 41.3  PLT 269 400   Recent Labs    03/21/21 0642  NA 140  K 3.3*  CL 104  CO2 28  GLUCOSE 93  BUN 15  CREATININE 0.70  CALCIUM 8.9    Intake/Output Summary (Last 24 hours) at 03/21/2021 1145 Last data filed at 03/21/2021 0600 Gross per 24 hour  Intake 350 ml  Output 1300 ml  Net -950 ml        Physical Exam: Vital Signs Blood pressure (!) 147/93, pulse 77, temperature 98.4 F (36.9 C), resp. rate 14, height 6' (1.829 m), weight 95.7 kg, SpO2 94 %.  Gen: no distress, normal appearing HEENT: oral mucosa pink and moist, NCAT Cardio: Reg rate Chest: normal effort, normal rate of breathing Abd: soft, non-distended Ext: no edema  Psych: pleasant, sl confused, no hallucinations this morning Skin: some excoriation of gluteal cleft Neuro:  alert, follows basic commands with delay. Oriented to person, hospital. Leans to right when at EOB. RUE 5/5. LUE 3/5. LLE 3+/5 RLE limited somewhat by knee pain Musculoskeletal: right knee and left wrist demonstrate decreased effusion, pain, and warmth    Assessment/Plan: 1. Functional deficits which require 3+ hours per day of interdisciplinary therapy in a comprehensive inpatient rehab setting. Physiatrist is providing close team supervision and 24 hour management of active medical problems listed below. Physiatrist and rehab team continue to  assess barriers to discharge/monitor patient progress toward functional and medical goals  Care Tool:  Bathing    Body parts bathed by patient: Left arm, Chest, Abdomen, Front perineal area, Face   Body parts bathed by helper: Right arm, Buttocks, Right upper leg, Left upper leg, Right lower leg, Left lower leg     Bathing assist Assist Level: Maximal Assistance - Patient 24 - 49% (MAX A STS)     Upper Body Dressing/Undressing Upper body dressing   What is the patient wearing?: Hospital gown only    Upper body assist Assist Level: Minimal Assistance - Patient > 75%    Lower Body Dressing/Undressing Lower body dressing      What is the patient wearing?:  (pt not wearing any brief)     Lower body assist Assist for lower body dressing: 2 Helpers     Toileting Toileting    Toileting assist Assist for toileting: 2 Helpers     Transfers Chair/bed transfer  Transfers assist  Chair/bed transfer activity did not occur: Safety/medical concerns  Chair/bed transfer assist level: Moderate Assistance - Patient 50 - 74%     Locomotion Ambulation   Ambulation assist   Ambulation activity did not occur: Safety/medical concerns  Walk 10 feet activity   Assist  Walk 10 feet activity did not occur: Safety/medical concerns        Walk 50 feet activity   Assist Walk 50 feet with 2 turns activity did not occur: Safety/medical concerns         Walk 150 feet activity   Assist Walk 150 feet activity did not occur: Safety/medical concerns         Walk 10 feet on uneven surface  activity   Assist Walk 10 feet on uneven surfaces activity did not occur: Safety/medical concerns         Wheelchair     Assist     Wheelchair activity did not occur: Safety/medical concerns (L wrist edema and pain, awaiting x-ray results, NWB during eval)         Wheelchair 50 feet with 2 turns activity    Assist    Wheelchair 50 feet with 2 turns  activity did not occur: Safety/medical concerns       Wheelchair 150 feet activity     Assist  Wheelchair 150 feet activity did not occur: Safety/medical concerns       Blood pressure (!) 147/93, pulse 77, temperature 98.4 F (36.9 C), resp. rate 14, height 6' (1.829 m), weight 95.7 kg, SpO2 94 %.  Medical Problem List and Plan: 1.  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  Antithrombotics: -DVT/anticoagulation:  Pharmaceutical: Other (comment)--transitioned to Eliquis on 09/08             -antiplatelet therapy: N/A 3. Left shoulder/right knee pain/Pain Management: Local measures with Voltaren gel/ice/heat. Favors knee in weight bearing             --xrays demonstrates effusion seen on exam.   -acute gout right knee and left wrist--responsive to prednisone  -continue 20mg  bid today and then begin 20mg  qd tomorrow. Discussed that steroids could be contributing to his hallucinations, but these are not bothersome to him, so will continue steroids.  -pt denied major knee pain PTA 4. Mood: LCSW to follow for evaluation and support.              -antipsychotic agents: N/A 5. Neuropsych: This patient is not  capable of making decisions on his own behalf. 6. Skin/Wound Care: Routine pressure relief measures.  7. Fluids/Electrolytes/Nutrition: encourage PO -hypokalemia: supplement -protein supp for low albumin  Additional on 9/12 as K+ is 3.3 8. 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              --Monitor HR TID--continue Eliquis and Lopressor  -addded norvasc with improvement 9. HTN: Monitor BP TID--on avapro, Lopressor,   -increase norvasc to 10mg .  10. RUE shaking movements: Felt to be postural tremor and Keppra d/c 09/06 11. Leucocytosis: wbc's up to 16 9/10 (had dropped to 11k)  -blood cx staph  epi (contaminate) on acute -UA negative and UC with insignificant growth -suspect gout is cause, also now on steroids -leukocytosis slowly trending down.  12. Hyperlipidemia: Now on Lipitor     LOS: 4 days A FACE TO FACE EVALUATION WAS PERFORMED  11/12 P Mauriana Dann 03/21/2021, 11:45 AM

## 2021-03-21 NOTE — Progress Notes (Signed)
Occupational Therapy Session Note  Patient Details  Name: Patrick Padilla MRN: [id number] Date of Birth: 06-29-1956  Today's Date: 03/21/2021 OT Individual Time: 6384-6659 OT Individual Time Calculation (min): 32 min    Short Term Goals: Week 1:  OT Short Term Goal 1 (Week 1): Pt will sit EOB with Supervision dynamically for 5 min OT Short Term Goal 2 (Week 1): Pt will don shirt wiht MIN A OT Short Term Goal 3 (Week 1): Pt will thread 1LE into pants wiht A for sitting balance only OT Short Term Goal 4 (Week 1): Pt will locate grooming items at sink with no cuing  Skilled Therapeutic Interventions/Progress Updates:    Patient in bed, alert, cooperative, visual hallucinations noted t/o session (he is seeing water on the floor, squirrels .Marland Kitchen..)  he is redirectable but does not believe that they are not there.   Supine to sitting edge of bed with min A.  SPT to/from bed, w/c, toilet min A with cues for safety and hand placement.  To gym via w/c Completed visual motor activities with BITS system:  trial 1:  1 min stance, dots right hand 2.27s reaction time,   trial 2:  1 min stance, dots left hand 2.39 sec reaction time,   trial 3:  saccades, A-Z in stance 2:53 - cues needed with fatigue, min A overall for balance, mild inattention to left side Toileting completed with min A cues for safety in stance.  He returned to bed min A, bed alarm set and call bell in hand.    Therapy Documentation Precautions:  Precautions Precautions: Fall Precaution Comments: impulsive Restrictions Weight Bearing Restrictions: No Other Position/Activity Restrictions: Edema and pain with weight bearing for R knee and L wrist   Therapy/Group: Individual Therapy  [name] 03/21/2021, 7:44 AM

## 2021-03-21 NOTE — Progress Notes (Signed)
Physical Therapy Session Note  Patient Details  Name: Patrick Padilla MRN: [id number] Date of Birth: 05-06-1956  Today's Date: 03/21/2021 PT Individual Time: 0848-1000 PT Individual Time Calculation (min): 72 min   Short Term Goals: Week 1:  PT Short Term Goal 1 (Week 1): Patient will perform bed mobility with min A consistently. PT Short Term Goal 2 (Week 1): Patient with perform basic transfers with min A consistently. PT Short Term Goal 3 (Week 1): Patient will perform static sitting balance with supervision in midline >2 min.  Skilled Therapeutic Interventions/Progress Updates:     Session 1: Patient in bed upon PT arrival. Patient alert and agreeable to PT session. Patient reported 10-15/10 back pain per patient report, no grimacing or limitation in mobility noted,during session, RN made aware. PT provided repositioning, rest breaks, and distraction as pain interventions throughout session.   Patient continues to demonstrate language of confusion and uses humor to mask deficits, is not oriented to location without max cues, and continues to report visual hallucinations throughout session. MD made aware while rounding during session.   Therapeutic Activity: Bed Mobility: Patient incontinent of bladder, performed rolling R/L with CGA-min A to facilitate opposite leg bending and coming across body to doff/don new brief and perform peri-care with set-up for front and total A for back side. Patient performed supine to/from sit with supervision and increased time in a flat bed without use of bed rails. Provided verbal cues for bringing opposite shoulder across to come to side-lying to push up to sitting with both hands. Transfers: Patient performed stand pivot bed>w/c and sit to/from stand x5 with min A without an AD and CGA using RW x1. Provided verbal cues for forward weight shift and reaching back for controlled descent. Patient performed a simulated SUV height, 31", car transfer with CGA for  safety using car frame for steadying as he performed step in technique. Provided cues for safe technique, and educated on reduced risk of falling if he performed sitting before bringing he legs into the vehicle, patient in agreement.  Gait Training:  Patient ambulated 36 feet and 72 feet with mod-min A with hand over therapist's shoulder x1 and B hands on shoulder x1. He then ambulated >200 feet with min A without an AD. Ambulated with decreased stance time and step length on L, increased R toe out, wide BOS with mild ataxic gait, and significantly decreased attention in an environment with increased stimulatin requiring max cues to attend to task for safety. Provided verbal cues for increased step length and height on L for increased reciprocal gait pattern, reduced BOS, and  facilitated weight shifting throughout. Patient ambulated 15 feet in the room using RW with CGA-min A.  Patient ascended/descended 12x6" steps using B rails with min A. Performed step-to progressing to reciprocal gait pattern with mild posterior lean throughout. Provided cues for technique and sequencing.   Neuromuscular Re-ed: Patient performed the following sitting balance activities for improved trunk control and independence with a functional activity: -sitting EOB >10 min with supervision: patient donned paper scrub pants performing several forward reaches to thread his feet through, doffed and donned a paper scrub top, and donned B non-skid socks, required min-mod cues for orientation of clothing and determining brining his foot up to figure four sitting versus reaching down to the floor for increased success with donning pants and socks   Patient in bed at end of session with breaks locked, bed alarm set, and all needs within reach. Updated safety plan for +  1 stand pivots with RW, NT and RN made aware.   Session 2: Patient in bed upon PT arrival. Patient alert and agreeable to PT session. Patient denied pain during session.  Patient with increased hallucinations and required max A for redirection to mobility due to preoccupation with "spiders all over the room."   Therapeutic Activity: Bed Mobility: Patient performed supine to/from sit with CGA for safety and facilitation of trunk control to come to sitting. Provided verbal cues for rolling through side-lying for reduced effort and to minimize back pain with mobility. Transfers: Patient performed sit to/from stand x2 with CGA-min A without AD. Provided verbal cues for forward weight shift and controlled descent for safety.  Gait Training:  Patient ambulated >200 feet using HHA with min A and intermittent mod A for LOB with distraction. Ambulated as described above.   Neuromuscular Re-ed: Patient attempted to performed the [facility] Test, very limited by distraction with visual hallucinations: Berg Balance Test Sit to Stand: Needs minimal aid to stand or to stabilize Standing Unsupported: Able to stand 30 seconds unsupported Sitting with Back Unsupported but Feet Supported on Floor or Stool: Able to sit safely and securely 2 minutes Stand to Sit: Sits independently, has uncontrolled descent Transfers: Needs one person to assist Standing Unsupported with Eyes Closed: Needs help to keep from falling Standing Ubsupported with Feet Together: Needs help to attain position but able to stand for 30 seconds with feet together From Standing, Reach Forward with Outstretched Arm: Loses balance while trying/requires external support From Standing Position, Pick up Object from Floor: Unable to try/needs assist to keep balance From Standing Position, Turn to Look Behind Over each Shoulder: Needs assist to keep from losing balance and falling Turn 360 Degrees: Needs assistance while turning Standing Unsupported, Alternately Place Feet on Step/Stool: Needs assistance to keep from falling or unable to try Standing Unsupported, One Foot in Front: Loses balance while stepping or  standing Standing on One Leg: Unable to try or needs assist to prevent fall Total Score: 10/56 8 point improvement since eval on 9/9  Patient in bed at end of session with breaks locked, bed alarm set, and all needs within reach.    Therapy Documentation Precautions:  Precautions Precautions: Fall Precaution Comments: impulsive Restrictions Weight Bearing Restrictions: No Other Position/Activity Restrictions: Edema and pain with weight bearing for R knee and L wrist    Therapy/Group: Individual Therapy  Rosabel Sermeno L Kvon Mcilhenny PT, DPT  03/21/2021, 12:24 PM

## 2021-03-21 NOTE — Progress Notes (Signed)
Speech Language Pathology Daily Session Note  Patient Details  Name: Patrick Padilla MRN: [id number] Date of Birth: [birth year]  Today's Date: 03/21/2021  Session 1: SLP Individual Time: 0725-0820 SLP Individual Time Calculation (min): 55 min  Session 2: SLP Individual Time: 1335-1400 SLP Individual Time Calculation (min): 25 min  Short Term Goals: Week 1: SLP Short Term Goal 1 (Week 1): Patient will demonstrate orientation to place, time and situation with use of external aids with Min verbal cues. SLP Short Term Goal 2 (Week 1): Patient will demonstrate sustained attention to functional tasks for ~30 minutes with Mod verbal cues for redirection. SLP Short Term Goal 3 (Week 1): Patient will demonstrate functional problem solving for basic and familiar tasks with Min verbal cues. SLP Short Term Goal 4 (Week 1): Patient will self-monitor and correct errors during functional tasks with Mod verbal cues. SLP Short Term Goal 5 (Week 1): Patient will consume current diet with minimal overt s/s of aspiration with Min verbal cues for use of swallowing compensatory strategies.  Skilled Therapeutic Interventions:  Session 1: Skilled treatment session focused on dysphagia and cognitive goals. SLP facilitated session by providing supervision level verbal cues for problem solving during tray set up with breakfast meal of Dys. 3 textures with thin liquids. Patient consumed meal without overt s/s of aspiration and was overall Mod I for use of swallowing compensatory strategies. Mod verbal cues were needed for selective attention to task in a mildly distracting environment. SLP administered the [facility] Mental Status Examination (SLUMS). Patient scored  18/30 points with a score of 27 or above considered normal. Patient demonstrated deficits in short-term memory and problem solving. Throughout session, patient was disoriented to time of day with intermittent hallucinations. RN aware. Patient left  upright in bed with alarm on and all needs within reach. Continue with current plan of care.    Session 2: Skilled treatment session focused on dysphagia and cognitive goals. SLP facilitated session by providing a snack since patient declined his lunch meal. Patient agreeable to cereal and consumed the mixed consistency without overt s/s of aspiration. Patient continues to demonstrate intermittent hallucinations. He could verbalize that he knows I can't see them but continues to believe they are real. Patient left upright in bed with alarm on and all needs within reach. Continue with current plan of care.      Pain Session 1: No/Denies Pain  Session 2: No/Denies Pain   Therapy/Group: Individual Therapy  Derra Shartzer 03/21/2021, 3:12 PM

## 2021-03-22 DIAGNOSIS — M109 Gout, unspecified: Secondary | ICD-10-CM

## 2021-03-22 DIAGNOSIS — I10 Essential (primary) hypertension: Secondary | ICD-10-CM | POA: Diagnosis not present

## 2021-03-22 DIAGNOSIS — D72828 Other elevated white blood cell count: Secondary | ICD-10-CM | POA: Diagnosis not present

## 2021-03-22 DIAGNOSIS — E876 Hypokalemia: Secondary | ICD-10-CM

## 2021-03-22 DIAGNOSIS — D72829 Elevated white blood cell count, unspecified: Secondary | ICD-10-CM

## 2021-03-22 DIAGNOSIS — I63511 Cerebral infarction due to unspecified occlusion or stenosis of right middle cerebral artery: Secondary | ICD-10-CM | POA: Diagnosis not present

## 2021-03-22 NOTE — Plan of Care (Signed)
Problem: RH Wheelchair Mobility Goal: LTG Patient will propel w/c in home environment (PT) Description: LTG: Patient will propel wheelchair in home environment, # of feet with assistance (PT). Outcome: Not Applicable Flowsheets (Taken 03/22/2021 0608) LTG: Pt will propel w/c in home environ  assist needed:: (D/c goal due to patient's progress with ambulation and reduced pain with mobility.) -- Note: D/c goal due to patient's progress with ambulation and reduced pain with mobility.   Problem: RH Balance Goal: LTG Patient will maintain dynamic standing balance (PT) Description: LTG:  Patient will maintain dynamic standing balance with assistance during mobility activities (PT) Flowsheets (Taken 03/22/2021 0608) LTG: Pt will maintain dynamic standing balance during mobility activities with:: (Upgraded goal due to patient's progress and reduced pain with mobility.) Supervision/Verbal cueing Note: Upgraded goal due to patient's progress and reduced pain with mobility.   Problem: Sit to Stand Goal: LTG:  Patient will perform sit to stand with assistance level (PT) Description: LTG:  Patient will perform sit to stand with assistance level (PT) Flowsheets (Taken 03/22/2021 0608) LTG: PT will perform sit to stand in preparation for functional mobility with assistance level: (Upgraded goal due to patient's progress and reduced pain with mobility.) Supervision/Verbal cueing Note: Upgraded goal due to patient's progress and reduced pain with mobility.   Problem: RH Car Transfers Goal: LTG Patient will perform car transfers with assist (PT) Description: LTG: Patient will perform car transfers with assistance (PT). Flowsheets (Taken 03/22/2021 0608) LTG: Pt will perform car transfers with assist:: (Upgraded goal due to patient's progress and reduced pain with mobility.) Supervision/Verbal cueing Note: Upgraded goal due to patient's progress and reduced pain with mobility.   Problem: RH Ambulation Goal:  LTG Patient will ambulate in controlled environment (PT) Description: LTG: Patient will ambulate in a controlled environment, # of feet with assistance (PT). Flowsheets (Taken 03/22/2021 0608) LTG: Pt will ambulate in controlled environ  assist needed:: (Upgraded goal due to patient's progress and reduced pain with mobility.) Supervision/Verbal cueing LTG: Ambulation distance in controlled environment: >200 ft using LRAD Note: Upgraded goal due to patient's progress and reduced pain with mobility.   Problem: RH Stairs Goal: LTG Patient will ambulate up and down stairs w/assist (PT) Description: LTG: Patient will ambulate up and down # of stairs with assistance (PT) Flowsheets (Taken 03/22/2021 0608) LTG: Pt will ambulate up/down stairs assist needed:: (Upgraded goal due to patient's progress and reduced pain with mobility.) Contact Guard/Touching assist LTG: Pt will  ambulate up and down number of stairs: 1 step using LRAD for home entry and 12 steps using LRAD for community access. Note: Upgraded goal due to patient's progress and reduced pain with mobility.   Problem: RH Ambulation Goal: LTG Patient will ambulate in home environment (PT) Description: LTG: Patient will ambulate in home environment, # of feet with assistance (PT). Flowsheets (Taken 03/22/2021 0612) LTG: Pt will ambulate in home environ  assist needed:: (Added goal due to patient's progress and reduced pain with mobility.) Supervision/Verbal cueing LTG: Ambulation distance in home environment: 50 ft using LRAD Note: Added goal due to patient's progress and reduced pain with mobility.   Problem: RH Attention Goal: LTG Patient will demonstrate this level of attention during functional activites (PT) Description: LTG:  Patient will demonstrate this level of attention during functional activites (PT) Flowsheets (Taken 03/22/2021 0612) Patient will demonstrate this level of attention during functional activites: Sustained Patient  will demonstrate above attention level in the following environment: Home LTG: Patient will demonstrate attention during functional mobility  with assistance of: Minimal Assistance - Patient > 75%

## 2021-03-22 NOTE — Progress Notes (Signed)
Patient ID: Patrick Padilla, male   DOB: [birth year], 65 y.o.   MRN: [id number]  SW met with pt in room, and called his son Patrick Padilla [phone]) to provide updates from the team conference, and d/c date 9/29. Son reported concerns about what may be causing hallucinations, and states this was occurring when pt was on acute. SW informed not sure what may be causing issue, however, will relay to medical team. Pt reports he thinks it is related to medication. When discussing physical assistance pt will require at home, reports there will be support when he returns home. SW discussed family education. Fam edu schedule for 9/23 1pm-3pm. SW encouraged follow-up if needed.   Loralee Pacas, MSW, [place] Office: [phone] Cell: [phone] Fax: [phone]

## 2021-03-22 NOTE — Patient Care Conference (Signed)
Inpatient RehabilitationTeam Conference and Plan of Care Update Date: 03/22/2021   Time: 10:15 AM    Patient Name: Patrick Padilla      Medical Record Number: [id number]  Date of Birth: September 04, 1955 Sex: Male         Room/Bed: 4W09C/4W09C-01 Payor Info: Payor: MEDICARE / Plan: MEDICARE PART A AND B / Product Type: *No Product type* /    Admit Date/Time:  03/17/2021  5:09 PM  Primary Diagnosis:  Acute ischemic right MCA stroke [facility])  Hospital Problems: Principal Problem:   Acute ischemic right MCA stroke (HCC) Active Problems:   Leucocytosis   Benign essential HTN   Hypokalemia   Acute gout of right knee    Expected Discharge Date: Expected Discharge Date: 04/07/21  Team Members Present: Physician leading conference: Dr. Maryla Morrow Social Worker Present: Cecile Sheerer, LCSWA Nurse Present: Kennyth Arnold, RN PT Present: Serina Cowper, PT OT Present: Kearney Hard, OT SLP Present: Feliberto Gottron, SLP PPS Coordinator present : Fae Pippin, SLP     Current Status/Progress Goal Weekly Team Focus  Bowel/Bladder   pt is cont of B/B. LBM is 9/11  Pt remain cont of B/B  Toilet q2h and as needed   Swallow/Nutrition/ Hydration   Dys. 3 textures with thin liquids, Supervision  Mod I  trials of regular textures   ADL's   Min/mod A overall  Supervision  general strengthening, self-care retraining, cognitive retrainig, activity tolerance   Mobility   Significant improvement in R knee and L wrist pain, min-mod A oveall, gait >200 feet, 12 steps B rails  Upgraded to Supervision overall  Balance, functional mobility, gait and stair training, activity tolerance, safety awareness, orientation, attention, L hemibody NMR, patient/caregiver education   Communication             Safety/Cognition/ Behavioral Observations  Mod-Max A  Supervision-Min A  orientation, intellectual awareness, sustained attention   Pain   Pt denies painaside from minor back pain related to therapy  PT  remain pain free  Assess for pain qshift and provide PRN   Skin   Pt has MASD on groin  Pt skin to be clean and free of breakdown  Assess skin and provide skin care daily     Discharge Planning:  D/c to home with intermitent support as son works days, and son's girlfriend can provide supervision during the day. Pt needs to be atleast Mod I at d/c.   Team Discussion: Has gout flares, started medication. WBC's improving with steroids, K+ low, will supplement. Continent B/B, left shoulder, left knee pain. Has Voltaren gel. Skin is CDI. Educating on stroke prevention, safety, and medication compliance. Has memory and attention deficits. Home with son, girlfriend can only provide supervision. Min/mod assist overall for ADL's. Having hallucinations and is aware of them. Was max assist Friday, min/mod assist yesterday. Ambulated > 200 ft. Went up/down stairs. Dys 3, thin liquids. Has shown improvement. Trials of regular tomorrow.    Patient on target to meet rehab goals: yes  *See Care Plan and progress notes for long and short-term goals.   Revisions to Treatment Plan:  Gout medication started, supplement K+.  Teaching Needs: Family education, medication management, pain management, transfer training, gait training, balance training, endurance training, stair training, safety awareness.  Current Barriers to Discharge: Decreased caregiver support, Medical stability, Home enviroment access/layout, Lack of/limited family support, Weight, and Medication compliance  Possible Resolutions to Barriers: Continue current medications, provide emotional support.     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, PRES  Barriers to Discharge: Medical stability;Medication compliance;Behavior;Nutrition means   Possible Resolutions to [employer] Focus: Therapies, follow labs - WBCs, optimize BP, monitor gout   Continued Need for Acute Rehabilitation Level of Care: The  patient requires daily medical management by a physician with specialized training in physical medicine and rehabilitation for the following reasons: Direction of a multidisciplinary physical rehabilitation program to maximize functional independence : Yes Medical management of patient stability for increased activity during participation in an intensive rehabilitation regime.: Yes Analysis of laboratory values and/or radiology reports with any subsequent need for medication adjustment and/or medical intervention. : Yes   I attest that I was present, lead the team conference, and concur with the assessment and plan of the team.   Tennis Must 03/22/2021, 1:12 PM

## 2021-03-22 NOTE — Progress Notes (Signed)
Occupational Therapy Session Note  Patient Details  Name: Patrick Padilla MRN: [id number] Date of Birth: 1956/07/10  Today's Date: 03/22/2021 OT Individual Time: 1000-1115 OT Individual Time Calculation (min): 75 min    Short Term Goals: Week 1:  OT Short Term Goal 1 (Week 1): Pt will sit EOB with Supervision dynamically for 5 min OT Short Term Goal 2 (Week 1): Pt will don shirt wiht MIN A OT Short Term Goal 3 (Week 1): Pt will thread 1LE into pants wiht A for sitting balance only OT Short Term Goal 4 (Week 1): Pt will locate grooming items at sink with no cuing  Skilled Therapeutic Interventions/Progress Updates:    Patient in bed, alert with stimulation.  Supine to sitting edge of bed with CS.   SPT with RW to/from bed, w/c, shower seat with CG/min A.   Shower completed with CS when seated, CGA in stance, ongoing cues and increased time.  Dressing completed seated w/c level - CS for [place] shirt, min A for pants/incontinence brief, mod A slipper socks, oral care/hair care with CS/set up.   Completed UB grip:   R = 85#, L = 55# 9 hole peg:  R = 36 sec, L = 56 sec Ambulation with RW in room back to bed with CGA.  Sit to supine CS.   Bed alarm set and call bell in hand.    Therapy Documentation Precautions:  Precautions Precautions: Fall Precaution Comments: impulsive Restrictions Weight Bearing Restrictions: No Other Position/Activity Restrictions: Edema and pain with weight bearing for R knee and L wrist   Therapy/Group: Individual Therapy  [name] 03/22/2021, 7:38 AM

## 2021-03-22 NOTE — Progress Notes (Signed)
Physical Therapy Session Note  Patient Details  Name: Patrick Padilla MRN: [id number] Date of Birth: [birth year]  Today's Date: 03/22/2021 PT Individual Time: 0800-0855 PT Individual Time Calculation (min): 55 min   Short Term Goals: Week 1:  PT Short Term Goal 1 (Week 1): Patient will perform bed mobility with min A consistently. PT Short Term Goal 2 (Week 1): Patient with perform basic transfers with min A consistently. PT Short Term Goal 3 (Week 1): Patient will perform static sitting balance with supervision in midline >2 min.  Skilled Therapeutic Interventions/Progress Updates:     Patient in bed with RN in the room upon PT arrival. Patient alert and agreeable to PT session. Patient reported 4-5/10 R knee and B calf pain/tightness during session, RN made aware. PT provided repositioning, rest breaks, and distraction as pain interventions throughout session. Noted mild increase in erythema and edema of R knee, suspect from increased mobility yesterday. Limited mobility and applied 4" ACE wrap and ice pack to R knee for edema/pain control.   Patient did not report visual hallucinations this morning, perseverated on "taking a nap" throughout session.  Visual Exam:  Eye alignment: mild L esotroopia Visual scanning: WFL Saccades: decreased attention to task Peripheral vision: limited L and superior fields Denies diplopia with B eyes open and R/L eye covered individually   Therapeutic Activity: Bed Mobility: Patient performed supine to/from sit with supervision to CGA to facilitate initiation. Provided verbal cues for initiation due to lack of internal motivation initially. Transfers: Patient performed sit to/from stand x5 with CGA without AD and close supervision with RW. Provided verbal cues for hand placement on RW, forward weight shift, and controlled descent for safety. Patient impulsive at end of turns and tends to through his hips around to sit. Required min A to assist safe  descent.  Gait Training:  Patient ambulated >100 feet with HHA min A x1 and with RW CGA x1. Reported increased R knee pain without AD and improved pain with AD. Ambulated with decreased stance time and step length on L, increased R toe out, wide BOS with mild ataxic gait, and significantly decreased attention in an environment with increased stimulatin requiring max cues to attend to task for safety. Provided verbal cues for increased step length and height on L for increased reciprocal gait pattern, reduced BOS, and  facilitated weight shifting throughout.  Therapeutic Exercise: Patient performed the following exercises with verbal and tactile cues for proper technique. -B seated hamstring stretch with heel cord stretch 2x1 min -B seated DF stretch 2x1 min seated with feet on floor and knees flexed to tolerance, provided manual over pressure at the knee for increase ROM -B LAQ 2x10 -SLS R/L 2x10 sec with single upper extremity support  Patient in bed with ice applied to R knee at end of session with breaks locked, bed alarm set, and all needs within reach.   Therapy Documentation Precautions:  Precautions Precautions: Fall Precaution Comments: impulsive Restrictions Weight Bearing Restrictions: No Other Position/Activity Restrictions: Edema and pain with weight bearing for R knee and L wrist    Therapy/Group: Individual Therapy  Drexler Maland L Davan Nawabi PT, DPT  03/22/2021, 2:33 PM

## 2021-03-22 NOTE — Progress Notes (Addendum)
PROGRESS NOTE   Subjective/Complaints: Patient seen laying in bed this AM.  He states he slept well overnight.    ROS: Denies CP, SOB, N/V/D  Objective:   No results found. Recent Labs    03/21/21 0642  WBC 15.7*  HGB 13.8  HCT 41.3  PLT 400    Recent Labs    03/21/21 0642  NA 140  K 3.3*  CL 104  CO2 28  GLUCOSE 93  BUN 15  CREATININE 0.70  CALCIUM 8.9     Intake/Output Summary (Last 24 hours) at 03/22/2021 1058 Last data filed at 03/22/2021 0536 Gross per 24 hour  Intake --  Output 875 ml  Net -875 ml         Physical Exam: Vital Signs Blood pressure (!) 160/102, pulse 77, temperature 98 F (36.7 C), resp. rate 18, height 6' (1.829 m), weight 95.7 kg, SpO2 99 %. Constitutional: No distress . Vital signs reviewed. HENT: Normocephalic.  Atraumatic. Eyes: EOMI. No discharge. Cardiovascular: No JVD.  RRR. Respiratory: Normal effort.  No stridor.  Bilateral clear to auscultation. GI: Non-distended.  BS +. Skin: Warm and dry.  Intact. Psych: Normal mood.  Normal behavior. Musc: No edema in extremities. LE edema. Neuro: Alert and oriented, except for date of month Motor: RUE 5/5.  RLE: 4/5 (some pain inhibition) LUE: 4-/5 proximal to distal.  LLE: 3+/5 HF, KE, 2/5 ADF  Assessment/Plan: 1. Functional deficits which require 3+ hours per day of interdisciplinary therapy in a comprehensive inpatient rehab setting. Physiatrist is providing close team supervision and 24 hour management of active medical problems listed below. Physiatrist and rehab team continue to assess barriers to discharge/monitor patient progress toward functional and medical goals  Care Tool:  Bathing    Body parts bathed by patient: Left arm, Chest, Abdomen, Front perineal area, Face   Body parts bathed by helper: Right arm, Buttocks, Right upper leg, Left upper leg, Right lower leg, Left lower leg     Bathing assist Assist  Level: Maximal Assistance - Patient 24 - 49% (MAX A STS)     Upper Body Dressing/Undressing Upper body dressing   What is the patient wearing?: Hospital gown only    Upper body assist Assist Level: Minimal Assistance - Patient > 75%    Lower Body Dressing/Undressing Lower body dressing      What is the patient wearing?:  (pt not wearing any brief)     Lower body assist Assist for lower body dressing: 2 Helpers     Toileting Toileting    Toileting assist Assist for toileting: 2 Helpers     Transfers Chair/bed transfer  Transfers assist  Chair/bed transfer activity did not occur: Safety/medical concerns  Chair/bed transfer assist level: Moderate Assistance - Patient 50 - 74%     Locomotion Ambulation   Ambulation assist   Ambulation activity did not occur: Safety/medical concerns          Walk 10 feet activity   Assist  Walk 10 feet activity did not occur: Safety/medical concerns        Walk 50 feet activity   Assist Walk 50 feet with 2 turns activity did not occur:  Safety/medical concerns         Walk 150 feet activity   Assist Walk 150 feet activity did not occur: Safety/medical concerns         Walk 10 feet on uneven surface  activity   Assist Walk 10 feet on uneven surfaces activity did not occur: Safety/medical concerns         Wheelchair     Assist     Wheelchair activity did not occur: Safety/medical concerns (L wrist edema and pain, awaiting x-ray results, NWB during eval)         Wheelchair 50 feet with 2 turns activity    Assist    Wheelchair 50 feet with 2 turns activity did not occur: Safety/medical concerns       Wheelchair 150 feet activity     Assist  Wheelchair 150 feet activity did not occur: Safety/medical concerns       Blood pressure (!) 160/102, pulse 77, temperature 98 F (36.7 C), resp. rate 18, height 6' (1.829 m), weight 95.7 kg, SpO2 99 %.  Medical Problem List and  Plan: 1.  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  Antithrombotics: -DVT/anticoagulation:  Pharmaceutical: Other (comment)--transitioned to Eliquis on 09/08             -antiplatelet therapy: N/A 3. Left shoulder/right knee pain/Pain Management: Local measures with Voltaren gel/ice/heat. Favors knee in weight bearing             --xrays demonstrates effusion seen on exam.   -acute gout right knee and left wrist--responsive to prednisone  -continue 20mg  bid today and then begin 20mg  qd tomorrow. Discussed that steroids could be contributing to his hallucinations, but these are not bothersome to him, so will continue steroids.  Improving on 9/13 4. Mood: LCSW to follow for evaluation and support.              -antipsychotic agents: N/A 5. Neuropsych: This patient is not  capable of making decisions on his own behalf. 6. Skin/Wound Care: Routine pressure relief measures.  7. Fluids/Electrolytes/Nutrition: encourage PO -hypokalemia: supplement -protein supp for low albumin  K+ 3.3 on 9/12, supplemented, labs ordered for tomorrow 8. 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              --Monitor HR TID--continue Eliquis and Lopressor  -addded norvasc with improvement 9. HTN: Monitor BP TID--on avapro, Lopressor   Increased norvasc to 10mg  on 9/13.  10. RUE shaking movements: Felt to be postural tremor and Keppra d/c 09/06 11. Leucocytosis:   -blood cx staph epi (contaminate) on acute -UA negative and UC with insignificant growth -suspect gout is cause, also now on steroids WBCs 15.7 on 9/12, cont to monitor 12. Hyperlipidemia: Now on Lipitor    LOS: 5 days A FACE TO FACE EVALUATION WAS PERFORMED  Patrick Padilla 10/13 03/22/2021, 10:58 AM

## 2021-03-22 NOTE — Progress Notes (Signed)
Speech Language Pathology Daily Session Note  Patient Details  Name: Patrick Padilla MRN: [id number] Date of Birth: 1955/12/30  Today's Date: 03/22/2021 SLP Individual Time: 0700-0745 SLP Individual Time Calculation (min): 45 min  Short Term Goals: Week 1: SLP Short Term Goal 1 (Week 1): Patient will demonstrate orientation to place, time and situation with use of external aids with Min verbal cues. SLP Short Term Goal 2 (Week 1): Patient will demonstrate sustained attention to functional tasks for ~30 minutes with Mod verbal cues for redirection. SLP Short Term Goal 3 (Week 1): Patient will demonstrate functional problem solving for basic and familiar tasks with Min verbal cues. SLP Short Term Goal 4 (Week 1): Patient will self-monitor and correct errors during functional tasks with Mod verbal cues. SLP Short Term Goal 5 (Week 1): Patient will consume current diet with minimal overt s/s of aspiration with Min verbal cues for use of swallowing compensatory strategies.  Skilled Therapeutic Interventions: Skilled treatment session focused on dysphagia and cognitive goals. SLP facilitated session by providing skilled observation with breakfast meal of Dys. 3 textures with thin liquids. Patient consumed meal without overt s/s of aspiration with increased mastication and without anterior spillage this session. Recommend trials of regular textures during next session. Also recommend supervision is changed to intermittent with meals. Throughout meal, Mod A verbal cues were needed for sustained attention to task. However, patient with increased orientation to place, time and situation and without hallucinations. SLP presented a basic money management task, however, patient unable to complete due to poor visual acuity. Patient tasked with calling his son (number left on schedule) to request glasses. Patient left upright in bed with alarm on and all needs within reach. Continue with current plan of care.       Pain No/Denies Pain   Therapy/Group: Individual Therapy  Patrick Padilla 03/22/2021, 12:29 PM

## 2021-03-22 NOTE — Progress Notes (Signed)
Physical Therapy Session Note  Patient Details  Name: Patrick Padilla MRN: [id number] Date of Birth: 25-Aug-1955  Today's Date: 03/22/2021 PT Individual Time: 1130-1200 PT Individual Time Calculation (min): 30 min   Short Term Goals: Week 1:  PT Short Term Goal 1 (Week 1): Patient will perform bed mobility with min A consistently. PT Short Term Goal 2 (Week 1): Patient with perform basic transfers with min A consistently. PT Short Term Goal 3 (Week 1): Patient will perform static sitting balance with supervision in midline >2 min.  Skilled Therapeutic Interventions/Progress Updates:  Patient supine in bed on entrance to room. On phone with brother at start of session. Patient alert and agreeable to PT session.   Patient with no pain complaint throughout session. But noted fatigue throughout.  Therapeutic Activity: Bed Mobility: Patient performed supine --> sit with Min A to UB with vc/ tc required for effort in push toward upright seated position. Transfers: Patient performed sit<>stand and stand pivot transfers throughout session with Min A and requires slight elevation in bed surface to complete with CGA. Provided verbal cues for technique in forward lean.  Gait Training:  Patient ambulated 5' x1/ 155' x1 using RW with CGA and w/c follow for fatigue. Demonstrated decreased step height bilaterally but vc/ tc for increasing LLE hip/ knee flexion in swing phase of gait. Also cued for crowding of L side of RW.   Patient seated  on EOB at end of session with brakes locked, bed alarm set, and all needs within reach with lunch tray set up in front of him. NT notified to check in with pt seated EOB.      Therapy Documentation Precautions:  Precautions Precautions: Fall Precaution Comments: impulsive Restrictions Weight Bearing Restrictions: No Other Position/Activity Restrictions: Edema and pain with weight bearing for R knee and L wrist  Pain:  No c/o pain this session.    Therapy/Group: Individual Therapy  Loel Dubonnet PT, DPT 03/22/2021, 7:01 PM

## 2021-03-23 DIAGNOSIS — D72829 Elevated white blood cell count, unspecified: Secondary | ICD-10-CM | POA: Diagnosis not present

## 2021-03-23 DIAGNOSIS — I63511 Cerebral infarction due to unspecified occlusion or stenosis of right middle cerebral artery: Secondary | ICD-10-CM | POA: Diagnosis not present

## 2021-03-23 DIAGNOSIS — I10 Essential (primary) hypertension: Secondary | ICD-10-CM | POA: Diagnosis not present

## 2021-03-23 DIAGNOSIS — M25461 Effusion, right knee: Secondary | ICD-10-CM | POA: Diagnosis not present

## 2021-03-23 DIAGNOSIS — I6783 Posterior reversible encephalopathy syndrome: Secondary | ICD-10-CM | POA: Diagnosis not present

## 2021-03-23 DIAGNOSIS — M109 Gout, unspecified: Secondary | ICD-10-CM | POA: Diagnosis not present

## 2021-03-23 LAB — BASIC METABOLIC PANEL
Anion gap: 6 (ref 5–15)
BUN: 13 mg/dL (ref 8–23)
CO2: 28 mmol/L (ref 22–32)
Calcium: 8.8 mg/dL — ABNORMAL LOW (ref 8.9–10.3)
Chloride: 104 mmol/L (ref 98–111)
Creatinine, Ser: 0.87 mg/dL (ref 0.61–1.24)
GFR, Estimated: >60 mL/min (ref >60)
Glucose, Bld: 91 mg/dL (ref 70–99)
Potassium: 4 mmol/L (ref 3.5–5.1)
Sodium: 138 mmol/L (ref 135–145)

## 2021-03-23 MED ORDER — PREDNISONE 5 MG PO TABS
10.0000 mg | ORAL_TABLET | Freq: Every day | ORAL | Status: AC
  Administered 2021-03-24 – 2021-03-25 (×2): 10 mg via ORAL
  Filled 2021-03-23 (×2): qty 2

## 2021-03-23 MED ORDER — PREDNISONE 20 MG PO TABS
20.0000 mg | ORAL_TABLET | Freq: Once | ORAL | Status: AC
  Administered 2021-03-23: 20 mg via ORAL
  Filled 2021-03-23: qty 1

## 2021-03-23 NOTE — Progress Notes (Signed)
Physical Therapy Session Note  Patient Details  Name: Patrick Padilla MRN: [id number] Date of Birth: 1956-04-24  Today's Date: 03/23/2021 PT Individual Time: 0805-0830 PT Individual Time Calculation (min): 25 min   Short Term Goals: Week 1:  PT Short Term Goal 1 (Week 1): Patient will perform bed mobility with min A consistently. PT Short Term Goal 2 (Week 1): Patient with perform basic transfers with min A consistently. PT Short Term Goal 3 (Week 1): Patient will perform static sitting balance with supervision in midline >2 min.  Skilled Therapeutic Interventions/Progress Updates:   Pt received supine in bed and agreeable to PT. Pt able to don pants while supine in bed with set up assist from PT. Supine>sit transfer with increased time and supervision assist for attention to L side. Pt then donned socks sitting EOB with set up assist.   Sit<>stand transfers performed with supervision assist throughout session x 2 with cues to push from sitting surface, but noted to hold onto RW throughout.   Gait training with RW 2 x 117f with CGA for safety and clothing management as well as  cues for activation of R side glutes and improved erect posture to increase step length.   Dynmaic balance training to perform lateral reach to the L to obtain bean bags and toss to target with RUE. Verbal cues for visual scanning to the L and improved stance width to prevent LOB.   Pt returned to room and performed ambulatory transfer to bed with RW and CGA. Sit>supine completed with supervision assist from PT for safety, and left supine in bed with call bell in reach and all needs met.         Therapy Documentation Precautions:  Precautions Precautions: Fall Precaution Comments: impulsive Restrictions Weight Bearing Restrictions: No Other Position/Activity Restrictions: Edema and pain with weight bearing for R knee and L wrist  Vital Signs: Therapy Vitals Temp: 98.7 F (37.1 C) Pulse Rate: 70 Resp:  18 BP: (!) 145/69 Patient Position (if appropriate): Lying Oxygen Therapy SpO2: 93 % O2 Device: Room Air Pain: Pain Assessment Pain Scale: 0-10 Pain Score: 0-No pain    Therapy/Group: Individual Therapy  ALorie Phenix9/14/2022, 8:34 AM

## 2021-03-23 NOTE — Progress Notes (Signed)
PROGRESS NOTE   Subjective/Complaints: Pt slept well. No new complaints. Moving much better. Pain controlled. Improving cognition  ROS: Patient denies fever, rash, sore throat, blurred vision, nausea, vomiting, diarrhea, cough, shortness of breath or chest pain,  headache, or mood change.   Objective:   No results found. Recent Labs    03/21/21 0642  WBC 15.7*  HGB 13.8  HCT 41.3  PLT 400   Recent Labs    03/21/21 0642 03/23/21 0458  NA 140 138  K 3.3* 4.0  CL 104 104  CO2 28 28  GLUCOSE 93 91  BUN 15 13  CREATININE 0.70 0.87  CALCIUM 8.9 8.8*    Intake/Output Summary (Last 24 hours) at 03/23/2021 0837 Last data filed at 03/23/2021 0721 Gross per 24 hour  Intake 680 ml  Output 1050 ml  Net -370 ml        Physical Exam: Vital Signs Blood pressure (!) 145/69, pulse 70, temperature 98.7 F (37.1 C), resp. rate 18, height 6' (1.829 m), weight 95.7 kg, SpO2 93 %. Constitutional: No distress . Vital signs reviewed. HEENT: NCAT, EOMI, oral membranes moist Neck: supple Cardiovascular: RRR without murmur. No JVD    Respiratory/Chest: CTA Bilaterally without wheezes or rales. Normal effort    GI/Abdomen: BS +, non-tender, non-distended Ext: no clubbing, cyanosis, or edema Psych: pleasant and cooperative  Musc: No edema in extremities. LE edema. Right knee and left wrist less tender Neuro: Alert and oriented, except for date of month Motor: RUE 5/5.  RLE: 4/5 (some pain inhibition) LUE: 4-/5 proximal to distal.  LLE: 3+/5 HF, KE, 2/5 ADF  Assessment/Plan: 1. Functional deficits which require 3+ hours per day of interdisciplinary therapy in a comprehensive inpatient rehab setting. Physiatrist is providing close team supervision and 24 hour management of active medical problems listed below. Physiatrist and rehab team continue to assess barriers to discharge/monitor patient progress toward functional and  medical goals  Care Tool:  Bathing    Body parts bathed by patient: Left arm, Chest, Abdomen, Front perineal area, Face, Right arm, Buttocks, Right upper leg, Left upper leg, Right lower leg, Left lower leg   Body parts bathed by helper: Right arm, Buttocks, Right upper leg, Left upper leg, Right lower leg, Left lower leg     Bathing assist Assist Level: Contact Guard/Touching assist     Upper Body Dressing/Undressing Upper body dressing   What is the patient wearing?: Pull over shirt    Upper body assist Assist Level: Supervision/Verbal cueing    Lower Body Dressing/Undressing Lower body dressing      What is the patient wearing?: Pants, Incontinence brief     Lower body assist Assist for lower body dressing: Minimal Assistance - Patient > 75%     Toileting Toileting    Toileting assist Assist for toileting: Minimal Assistance - Patient > 75%     Transfers Chair/bed transfer  Transfers assist  Chair/bed transfer activity did not occur: Safety/medical concerns  Chair/bed transfer assist level: Moderate Assistance - Patient 50 - 74%     Locomotion Ambulation   Ambulation assist   Ambulation activity did not occur: Safety/medical concerns  Walk 10 feet activity   Assist  Walk 10 feet activity did not occur: Safety/medical concerns        Walk 50 feet activity   Assist Walk 50 feet with 2 turns activity did not occur: Safety/medical concerns         Walk 150 feet activity   Assist Walk 150 feet activity did not occur: Safety/medical concerns         Walk 10 feet on uneven surface  activity   Assist Walk 10 feet on uneven surfaces activity did not occur: Safety/medical concerns         Wheelchair     Assist     Wheelchair activity did not occur: Safety/medical concerns (L wrist edema and pain, awaiting x-ray results, NWB during eval)         Wheelchair 50 feet with 2 turns activity    Assist     Wheelchair 50 feet with 2 turns activity did not occur: Safety/medical concerns       Wheelchair 150 feet activity     Assist  Wheelchair 150 feet activity did not occur: Safety/medical concerns       Blood pressure (!) 145/69, pulse 70, temperature 98.7 F (37.1 C), resp. rate 18, height 6' (1.829 m), weight 95.7 kg, SpO2 93 %.  Medical Problem List and Plan: 1.  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  Antithrombotics: -DVT/anticoagulation:  Pharmaceutical: Other (comment)--transitioned to Eliquis on 09/08             -antiplatelet therapy: N/A 3. Left shoulder/right knee pain/Pain Management: Local measures with Voltaren gel/ice/heat. Favors knee in weight bearing             --xrays demonstrates effusion seen on exam.   -acute gout right knee and left wrist--responsive to prednisone  -decrease prednisone to 10mg  tomorrow 9/15 4. Mood: LCSW to follow for evaluation and support.              -antipsychotic agents: N/A 5. Neuropsych: This patient is not  capable of making decisions on his own behalf. 6. Skin/Wound Care: Routine pressure relief measures.  7. Fluids/Electrolytes/Nutrition: encourage PO -hypokalemia: supplement -protein supp for low albumin  K+ 3.3 on 9/12,-->4.0 after supplementation 8. 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              --Monitor HR TID--continue Eliquis and Lopressor  -addded norvasc with improvement 9. HTN: Monitor BP TID--on avapro, Lopressor   Increased norvasc to 10mg  on 9/13.  10. RUE shaking movements: Felt to be postural tremor and Keppra d/c 09/06 11. Leucocytosis:   -blood cx staph epi (contaminate) on acute -UA negative and UC with insignificant growth -suspect gout is cause, also now on steroids WBCs 15.7 on 9/12, recheck Friday 12. Hyperlipidemia: Now on Lipitor    LOS: 6 days A FACE TO FACE EVALUATION WAS PERFORMED  11/12 03/23/2021, 8:37 AM

## 2021-03-23 NOTE — Progress Notes (Signed)
Physical Therapy Weekly Progress Note  Patient Details  Name: Patrick Padilla MRN: [id number] Date of Birth: [birth year]  Beginning of progress report period: March 18, 2021 End of progress report period: March 23, 2021  Today's Date: 03/23/2021 PT Individual Time: 1300-1400 PT Individual Time Calculation (min): 60 min   Patient has met 3 of 3 short term goals. Patient with excellent progress this week, functional improvements noted with improved R knee and L wrist edema and pain. Patient currently performs bed mobility with supervision in a flat bed with increased time due to back pain, transfers and gait with CGA using RW and min A without RW, ambulating >200 ft, and performs 12x6" steps with B rails with CGA-min A. Continues to present with significant L inattention, is easily externally distracted, mild-moderate impulsivity, and demonstrates decreased safety awareness and increased fall risk with all mobility. Patient also presently having visual hallucinations, medical team aware.   Patient continues to demonstrate the following deficits muscle weakness and muscle joint tightness, decreased cardiorespiratoy endurance, ataxia and decreased coordination, decreased attention to left, decreased attention, decreased awareness, decreased problem solving, decreased safety awareness, and decreased memory, and decreased sitting balance, decreased standing balance, decreased postural control, hemiplegia, and decreased balance strategies and therefore will continue to benefit from skilled PT intervention to increase functional independence with mobility.  Patient  Upgraded LTG's due to patient's progress with reduced R knee pain. Goals set for Supervision overall .  Continue plan of care.  PT Short Term Goals Week 1:  PT Short Term Goal 1 (Week 1): Patient will perform bed mobility with min A consistently. PT Short Term Goal 1 - Progress (Week 1): Met PT Short Term Goal 2 (Week 1): Patient with  perform basic transfers with min A consistently. PT Short Term Goal 2 - Progress (Week 1): Met PT Short Term Goal 3 (Week 1): Patient will perform static sitting balance with supervision in midline >2 min. PT Short Term Goal 3 - Progress (Week 1): Met Week 2:  PT Short Term Goal 1 (Week 2): Patient will perform basic transfers with CGA consistently using LRAD. PT Short Term Goal 2 (Week 2): Patient will ambulate >200 feet with CGA demonstrating visual scanning strategies to improve L attention with min cues. PT Short Term Goal 3 (Week 2): Patient will perform dynamic standing balance >5 min with CGA without upper extremity support. PT Short Term Goal 4 (Week 2): Patient will improve score on Berg Balance Scale >4 points to meet MCID.  Skilled Therapeutic Interventions/Progress Updates:     Patient in bed asleep with untouched lunch tray at bedside upon PT arrival. Patient slow to arouse and agreeable to PT session. Patient reported 2-3/10 back and R knee pain during session, RN made aware. PT provided repositioning, rest breaks, and distraction as pain interventions throughout session.   Therapeutic Activity: Bed Mobility: Patient performed supine to sit with supervision and increased time in a flat bed without use of bed rails. Required lying rest break in side-lying due to increased fatigue and back pain with mobility. Provided verbal cues for log roll technique to reduce back pain with mobility. Patient sat EOB to eat lunch. Discussed d/c planning, POC, upgraded goals, patient progress, and leisure activities while patient ate. Patient able to demonstrate divided attention between 2 tasks (eating and maintaining conversation with PT). Patient reports he likes to spend time outside doing yard work or working in his garden. Focused remainder of session on going outside for therapeutic improvement for patient's  mood and emotional wellbeing and to initiate gait training on outdoor surfaces. Transfers:  Patient performed stand pivot bed>w/c, sit to/from stand x2, and ambulatory transfer to/from bathroom with CGA using RW. Provided verbal cues for scooting forward and forward weight shift to stand, and reaching back to control descent when sitting.  Gait Training:  Patient ambulated >200 feet using RW over unlevel brick and paved sidewalk with CGA. Ambulated with decreased stance time and step length on L, increased R toe out, wide BOS with mild ataxic gait, and significantly decreased attention in an environment with increased stimulatin requiring max cues to attend to task for safety. Provided verbal cues for increased step length and height on L for increased reciprocal gait pattern, reduced BOS, and  facilitated weight shifting throughout.  Wheelchair Mobility:  Patient was transported in the w/c with total A throughout session for energy conservation and time management.  Patient in w/c in the room, encouraged patient to sit up for improved daytime arousal, at end of session with breaks locked, seat belt alarm set, and all needs within reach.   Therapy Documentation Precautions:  Precautions Precautions: Fall Precaution Comments: impulsive Restrictions Weight Bearing Restrictions: No Other Position/Activity Restrictions: Edema and pain with weight bearing for R knee and L wrist   Therapy/Group: Individual Therapy  Sheina Mcleish L Tajana Crotteau PT, DPT  03/23/2021, 4:41 PM

## 2021-03-23 NOTE — Progress Notes (Signed)
Occupational Therapy Session Note  Patient Details  Name: Patrick Padilla MRN: [id number] Date of Birth: Dec 02, 1955  Today's Date: 03/23/2021 OT Individual Time: 1500-1600 OT Individual Time Calculation (min): 60 min    Short Term Goals: Week 1:  OT Short Term Goal 1 (Week 1): Pt will sit EOB with Supervision dynamically for 5 min OT Short Term Goal 2 (Week 1): Pt will don shirt wiht MIN A OT Short Term Goal 3 (Week 1): Pt will thread 1LE into pants wiht A for sitting balance only OT Short Term Goal 4 (Week 1): Pt will locate grooming items at sink with no cuing  Skilled Therapeutic Interventions/Progress Updates:     Pt received in w/c with no pain reported  Therapeutic activity Pt completes gift shop item search task to target L attention, visual scanning, memory, pathfinding and functional mobility. Pt uses RW throughout gift shop with CGA to locate 5 items. Before starting mobility, pt able to immediately recall 3-5 items. Pt able to recall 4/5 items while looking in gift shop with MOD cuing for scanning strategy and question cues to improve L scanning for last item. Discussed implications of L inattention.  Pt completes functional mobility in outside courtyard with RW with increased hitting of L toes on walker and bumping RW onto items on L. VC for RW management and proximity to RW.   Pt left at end of session in w/c with exit alarm on, call light in reach and all needs met   Therapy Documentation Precautions:  Precautions Precautions: Fall Precaution Comments: impulsive Restrictions Weight Bearing Restrictions: No Other Position/Activity Restrictions: Edema and pain with weight bearing for R knee and L wrist General:     Therapy/Group: Individual Therapy  Tonny Branch 03/23/2021, 7:01 AM

## 2021-03-23 NOTE — Progress Notes (Signed)
Speech Language Pathology Daily Session Note  Patient Details  Name: Patrick Padilla MRN: [id number] Date of Birth: 1955-10-03  Today's Date: 03/23/2021 SLP Individual Time: 0830-0930 SLP Individual Time Calculation (min): 60 min  Short Term Goals: Week 1: SLP Short Term Goal 1 (Week 1): Patient will demonstrate orientation to place, time and situation with use of external aids with Min verbal cues. SLP Short Term Goal 2 (Week 1): Patient will demonstrate sustained attention to functional tasks for ~30 minutes with Mod verbal cues for redirection. SLP Short Term Goal 3 (Week 1): Patient will demonstrate functional problem solving for basic and familiar tasks with Min verbal cues. SLP Short Term Goal 4 (Week 1): Patient will self-monitor and correct errors during functional tasks with Mod verbal cues. SLP Short Term Goal 5 (Week 1): Patient will consume current diet with minimal overt s/s of aspiration with Min verbal cues for use of swallowing compensatory strategies.  Skilled Therapeutic Interventions: Pt seen for skilled ST with focus on dysphagia and cognitive goals. SLP facilitating regular solid trials by providing Supervision level cues to utilize swallow strategies as trained. Pt demonstrated mild extended and slow mastication of solids, mild R pocketing effectively cleared with thin liquid wash. Pt with no overt s/s aspiration across trials, will benefit from whole meal trial of regular solids to determine fatigue factor and potential for diet upgrade. Pt oriented x4 independently this date, does have some disorientation and inconsistent recall of reason for hospitalization however min A cues increase recall and carryover of info. SLP facilitating mildly complex problem ID task by providing min-mod A verbal cues. Pt would initially respond vaguely or with humor to problem ID picture cards (spilled milk on the floor, pt would say "don't cry over spilled milk", picture of young girl incorrectly  taking medication pt stated "looks like my next wife" and would hand the card back). Pt very responsive to verbal cueing throughout tx session. Pt asking SLP then RN if he gets to go home today, indicating decreased insight and awareness into current deficits. Pt left in bed with alarm set and all needs within reach. Cont ST POC.   Pain Pain Assessment Pain Scale: 0-10 Pain Score: 0-No pain  Therapy/Group: Individual Therapy  Tacey Ruiz 03/23/2021, 9:17 AM

## 2021-03-24 DIAGNOSIS — M25461 Effusion, right knee: Secondary | ICD-10-CM | POA: Diagnosis not present

## 2021-03-24 DIAGNOSIS — D72829 Elevated white blood cell count, unspecified: Secondary | ICD-10-CM | POA: Diagnosis not present

## 2021-03-24 DIAGNOSIS — I63511 Cerebral infarction due to unspecified occlusion or stenosis of right middle cerebral artery: Secondary | ICD-10-CM | POA: Diagnosis not present

## 2021-03-24 DIAGNOSIS — M109 Gout, unspecified: Secondary | ICD-10-CM | POA: Diagnosis not present

## 2021-03-24 DIAGNOSIS — I6783 Posterior reversible encephalopathy syndrome: Secondary | ICD-10-CM | POA: Diagnosis not present

## 2021-03-24 DIAGNOSIS — I10 Essential (primary) hypertension: Secondary | ICD-10-CM | POA: Diagnosis not present

## 2021-03-24 NOTE — Progress Notes (Signed)
Speech Language Pathology Daily Session Note  Patient Details  Name: Patrick Padilla MRN: [id number] Date of Birth: 1956/06/26  Today's Date: 03/24/2021 SLP Individual Time: 0725-0810 SLP Individual Time Calculation (min): 45 min  Short Term Goals: Week 1: SLP Short Term Goal 1 (Week 1): Patient will demonstrate orientation to place, time and situation with use of external aids with Min verbal cues. SLP Short Term Goal 2 (Week 1): Patient will demonstrate sustained attention to functional tasks for ~30 minutes with Mod verbal cues for redirection. SLP Short Term Goal 3 (Week 1): Patient will demonstrate functional problem solving for basic and familiar tasks with Min verbal cues. SLP Short Term Goal 4 (Week 1): Patient will self-monitor and correct errors during functional tasks with Mod verbal cues. SLP Short Term Goal 5 (Week 1): Patient will consume current diet with minimal overt s/s of aspiration with Min verbal cues for use of swallowing compensatory strategies.  Skilled Therapeutic Interventions: Skilled treatment session focused on cognitive goals. Upon arrival, patient was wearing his eye glasses with a lens missing with minimal awareness. Patient reported his lens has been missing "since the beginning of all this" but it was located on his breakfast tray. SLP was able to fix glasses and patient utilized them throughout the session. SLP facilitated session by providing Mod A verbal cues for sustained attention and functional problem solving during a basic money management task. Patient reported he was ready to go home today with decreased awareness of deficits or timeline since admission. SLP provided a visual aid to assist in recall. Patient left upright in bed with alarm on and all needs within reach. Continue with current plan of care.      Pain No/Denies Pain   Therapy/Group: Individual Therapy  Laney Louderback 03/24/2021, 2:04 PM

## 2021-03-24 NOTE — Progress Notes (Signed)
PROGRESS NOTE   Subjective/Complaints: Slept well again. Says hallucinations are less. Pain is much improved. Wanted to know when he'll go home  ROS: Patient denies fever, rash, sore throat, blurred vision, nausea, vomiting, diarrhea, cough, shortness of breath or chest pain,  headache, or mood change. .   Objective:   No results found. No results for input(s): WBC, HGB, HCT, PLT in the last 72 hours.  Recent Labs    03/23/21 0458  NA 138  K 4.0  CL 104  CO2 28  GLUCOSE 91  BUN 13  CREATININE 0.87  CALCIUM 8.8*    Intake/Output Summary (Last 24 hours) at 03/24/2021 1006 Last data filed at 03/24/2021 0744 Gross per 24 hour  Intake 640 ml  Output 2650 ml  Net -2010 ml        Physical Exam: Vital Signs Blood pressure (!) 153/86, pulse 73, temperature 98.1 F (36.7 C), temperature source Oral, resp. rate 18, height 6' (1.829 m), weight 95.7 kg, SpO2 98 %. Constitutional: No distress . Vital signs reviewed. HEENT: NCAT, EOMI, oral membranes moist Neck: supple Cardiovascular: RRR without murmur. No JVD    Respiratory/Chest: CTA Bilaterally without wheezes or rales. Normal effort    GI/Abdomen: BS +, non-tender, non-distended Ext: no clubbing, cyanosis, or edema Psych: pleasant and cooperative  Musc: No edema in extremities. LE edema. Right knee and left wrist minimally tender Neuro: Alert and oriented, except for date of month Motor: RUE 5/5.  RLE: 4/5   LUE: 4-/5 proximal to distal.  LLE: 3+/5 HF, KE, 2/5 ADF  Assessment/Plan: 1. Functional deficits which require 3+ hours per day of interdisciplinary therapy in a comprehensive inpatient rehab setting. Physiatrist is providing close team supervision and 24 hour management of active medical problems listed below. Physiatrist and rehab team continue to assess barriers to discharge/monitor patient progress toward functional and medical goals  Care  Tool:  Bathing    Body parts bathed by patient: Left arm, Chest, Abdomen, Front perineal area, Face, Right arm, Buttocks, Right upper leg, Left upper leg, Right lower leg, Left lower leg   Body parts bathed by helper: Right arm, Buttocks, Right upper leg, Left upper leg, Right lower leg, Left lower leg     Bathing assist Assist Level: Contact Guard/Touching assist     Upper Body Dressing/Undressing Upper body dressing   What is the patient wearing?: Pull over shirt    Upper body assist Assist Level: Supervision/Verbal cueing    Lower Body Dressing/Undressing Lower body dressing      What is the patient wearing?: Pants, Incontinence brief     Lower body assist Assist for lower body dressing: Minimal Assistance - Patient > 75%     Toileting Toileting    Toileting assist Assist for toileting: Minimal Assistance - Patient > 75%     Transfers Chair/bed transfer  Transfers assist  Chair/bed transfer activity did not occur: Safety/medical concerns  Chair/bed transfer assist level: Moderate Assistance - Patient 50 - 74%     Locomotion Ambulation   Ambulation assist   Ambulation activity did not occur: Safety/medical concerns          Walk 10 feet activity  Assist  Walk 10 feet activity did not occur: Safety/medical concerns        Walk 50 feet activity   Assist Walk 50 feet with 2 turns activity did not occur: Safety/medical concerns         Walk 150 feet activity   Assist Walk 150 feet activity did not occur: Safety/medical concerns         Walk 10 feet on uneven surface  activity   Assist Walk 10 feet on uneven surfaces activity did not occur: Safety/medical concerns         Wheelchair     Assist     Wheelchair activity did not occur: Safety/medical concerns (L wrist edema and pain, awaiting x-ray results, NWB during eval)         Wheelchair 50 feet with 2 turns activity    Assist    Wheelchair 50 feet with 2  turns activity did not occur: Safety/medical concerns       Wheelchair 150 feet activity     Assist  Wheelchair 150 feet activity did not occur: Safety/medical concerns       Blood pressure (!) 153/86, pulse 73, temperature 98.1 F (36.7 C), temperature source Oral, resp. rate 18, height 6' (1.829 m), weight 95.7 kg, SpO2 98 %.  Medical Problem List and Plan: 1.  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  Antithrombotics: -DVT/anticoagulation:  Pharmaceutical: Other (comment)--transitioned to Eliquis on 09/08             -antiplatelet therapy: N/A 3. Left shoulder/right knee pain/Pain Management: Local measures with Voltaren gel/ice/heat. Favors knee in weight bearing             --xrays demonstrates effusion seen on exam.   -acute gout right knee and left wrist--responsive to prednisone  -prednisone 10mg  x 2 days then stop 4. Mood: LCSW to follow for evaluation and support.              -antipsychotic agents: N/A 5. Neuropsych: This patient is not  capable of making decisions on his own behalf. 6. Skin/Wound Care: Routine pressure relief measures.  7. Fluids/Electrolytes/Nutrition: encourage PO -hypokalemia: supplement -protein supp for low albumin  K+ 3.3 on 9/12,-->4.0 after supplementation -recheck friday 8. 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              --Monitor HR TID--continue Eliquis and Lopressor  -addded norvasc with improvement 9. HTN: Monitor BP TID--on avapro, Lopressor   Increased norvasc to 10mg  on 9/13 with improvement.  10. RUE shaking movements: Felt to be postural tremor and Keppra d/c 09/06 11. Leucocytosis:   -blood cx staph epi (contaminate) on acute -UA negative and UC with insignificant growth -suspect gout is cause, also now on steroids WBCs 15.7 on 9/12, recheck Friday 12. Hyperlipidemia: Now on Lipitor    LOS: 7 days A FACE TO FACE EVALUATION  WAS PERFORMED  11/12 03/24/2021, 10:06 AM

## 2021-03-24 NOTE — Progress Notes (Signed)
Occupational Therapy Session Note  Patient Details  Name: Patrick Padilla MRN: [id number] Date of Birth: Jul 20, 1955  Today's Date: 03/24/2021 OT Individual Time: 3953-2023 OT Individual Time Calculation (min): 74 min   Today's Date: 03/24/2021 OT Individual Time: 3435-6861 OT Individual Time Calculation (min): 44 min   Short Term Goals:  Week 1:  OT Short Term Goal 1 (Week 1): Pt will sit EOB with Supervision dynamically for 5 min OT Short Term Goal 2 (Week 1): Pt will don shirt wiht MIN A OT Short Term Goal 3 (Week 1): Pt will thread 1LE into pants wiht A for sitting balance only OT Short Term Goal 4 (Week 1): Pt will locate grooming items at sink with no cuing   Skilled Therapeutic Interventions/Progress Updates:   Session I:   Pt received supine in bed, no c/o pain and agreeable to OT session focusing on self-care activities. Supine > EOB with supervision, demonstrated good sitting balance EOB. Used RW to complete functional mobility to bathroom. Required mod A for pivoting to shower chair. Needed rest break due to fatigue in LB from yesterday's functional mobility. Pt externally distracted and perseverative for reaching for items in shower that were present but not visually present..potential visual hallucinations? Impacted speed and efficiency with ADL performance although pt declined any issues with vision. Pt neglected to wash L side of hair/face due to inattention and needed min cuing; however, able to wash L side of body without cuing. Pt verbalizing some needs throughout shower including when he was ready to stand up but also demonstrated lack of safety awareness with attempting to reach random object on floor between legs that wasn't there. Used RW to complete functional mobility back to wc, requiring CGA and mod directional cuing for change in threshold. Transitioned into day room to work on dynamic sitting balance activity to facilitate weight shift while reaching in different planes,  crossing midline and increasing attention to L side. Pt completed task with 100% accuracy. Graded up to add cognitive component of matching colors to related items (red = apple) and pt followed commands with 100% accuracy. Wheeled back to room for time management. Stand > pivot completed with min A. Pt left in bed, call bell nearby, bed alarm on and all needs met.    Session II: Pt received supine in bed, stating he was "tired from earlier" but no c/o of pain and agreeable to OT after encouragement. Pt completed supine >  EOB with close (S) and used RW to doorway. Stand > sit to wc for energy conservation to ortho gym. Worked on visual scanning activities on the BITS, completing visual scanning activity of sequential alphabet letters with accuracy of 83.87% seated in wc. Pt able to scan with additional time needed for letters on L side and required mod cuing to follow sequence of alphabet. Received a score of 75% on next activity which focused on memory recall of a series of words with auditory prompt, seated in wc. Pt demonstrated delayed reaction and needed additional processing time for targeting correct words. Third activity in standing with RW received a score of 93.1% - pt needed min cuing to remember sequence of numbers, and had difficulty scanning to the L inferior side of the screen. Pt tolerated activity well throughout tx. Needed mod VC to use hands instead of grabbing RW when standing from wc level. Path finding back to room with total cuing for use of external aid (direction sign on wall), however pt unable to identify turning to  the left as a viable option without multimodal cuing to the L. Pt able to recall parts of room number, but did not correctly identify in order or proper sequence. Easily distracted throughout tx by external events - needed frequent redirection when stimuli increased such as people entering gym. Frequently forgot foot placement in RW and required MAX cuing for safety during  turns. Pt left supine in bed, alarm on, call bell nearby and needs met.   Therapy Documentation Precautions:  Precautions Precautions: Fall Precaution Comments: impulsive Restrictions Weight Bearing Restrictions: No Other Position/Activity Restrictions: Edema and pain with weight bearing for R knee and L wrist    Therapy/Group: Individual Therapy  Patrick Padilla 03/24/2021, 6:53 AM

## 2021-03-24 NOTE — Progress Notes (Signed)
Physical Therapy Session Note  Patient Details  Name: Patrick Padilla MRN: [id number] Date of Birth: 06/16/1956  Today's Date: 03/24/2021 PT Missed Time: 45 Minutes Missed Time Reason: Patient fatigue  Pt received L sidelying in bed asleep and upon awakening pt initially agreeable to therapy session but requesting time to arouse. During this time, therapist discussed pt's CLOF and activities performed during therapy sessions yesterday to determine where pt's concerns are regarding his mobility level. Pt then decides he does not want to participate in therapy session at this time and instead requests to "chill" stating he is tired but no additional reasons provided. Pt left supine in bed with needs in reach, bed alarm on, and telesitter in place. Missed 45 minutes of skilled physical therapy.  Ginny Forth , PT, DPT, NCS, CSRS 03/24/2021, 8:00 AM

## 2021-03-24 NOTE — Plan of Care (Signed)
  Problem: RH SAFETY Goal: RH STG ADHERE TO SAFETY PRECAUTIONS W/ASSISTANCE/DEVICE Description: STG Adhere to Safety Precautions With cues Assistance/Device. Outcome: Not Progressing; telesitter   Problem: RH KNOWLEDGE DEFICIT Goal: RH STG INCREASE KNOWLEDGE OF HYPERTENSION Description: Patient's son will be able to manage HTN with medications and dietary modifications using handouts and educational resources independently Outcome: Not Progressing; some confusion at times

## 2021-03-25 DIAGNOSIS — D72829 Elevated white blood cell count, unspecified: Secondary | ICD-10-CM | POA: Diagnosis not present

## 2021-03-25 DIAGNOSIS — I10 Essential (primary) hypertension: Secondary | ICD-10-CM | POA: Diagnosis not present

## 2021-03-25 DIAGNOSIS — M109 Gout, unspecified: Secondary | ICD-10-CM | POA: Diagnosis not present

## 2021-03-25 DIAGNOSIS — I63511 Cerebral infarction due to unspecified occlusion or stenosis of right middle cerebral artery: Secondary | ICD-10-CM | POA: Diagnosis not present

## 2021-03-25 DIAGNOSIS — I6783 Posterior reversible encephalopathy syndrome: Secondary | ICD-10-CM | POA: Diagnosis not present

## 2021-03-25 DIAGNOSIS — M25461 Effusion, right knee: Secondary | ICD-10-CM | POA: Diagnosis not present

## 2021-03-25 LAB — CBC
HCT: 47.9 % (ref 39.0–52.0)
Hemoglobin: 15.8 g/dL (ref 13.0–17.0)
MCH: 33.5 pg (ref 26.0–34.0)
MCHC: 33 g/dL (ref 30.0–36.0)
MCV: 101.7 fL — ABNORMAL HIGH (ref 80.0–100.0)
Platelets: 583 10*3/uL — ABNORMAL HIGH (ref 150–400)
RBC: 4.71 MIL/uL (ref 4.22–5.81)
RDW: 12.7 % (ref 11.5–15.5)
WBC: 17.2 10*3/uL — ABNORMAL HIGH (ref 4.0–10.5)
nRBC: 0 % (ref 0.0–0.2)

## 2021-03-25 LAB — BASIC METABOLIC PANEL
Anion gap: 9 (ref 5–15)
BUN: 17 mg/dL (ref 8–23)
CO2: 27 mmol/L (ref 22–32)
Calcium: 9.3 mg/dL (ref 8.9–10.3)
Chloride: 103 mmol/L (ref 98–111)
Creatinine, Ser: 1 mg/dL (ref 0.61–1.24)
GFR, Estimated: >60 mL/min (ref >60)
Glucose, Bld: 98 mg/dL (ref 70–99)
Potassium: 4.5 mmol/L (ref 3.5–5.1)
Sodium: 139 mmol/L (ref 135–145)

## 2021-03-25 MED ORDER — POTASSIUM CHLORIDE CRYS ER 10 MEQ PO TBCR
10.0000 meq | EXTENDED_RELEASE_TABLET | Freq: Every day | ORAL | Status: DC
  Administered 2021-03-26: 10 meq via ORAL
  Filled 2021-03-25: qty 1

## 2021-03-25 NOTE — Progress Notes (Signed)
PROGRESS NOTE   Subjective/Complaints: No problems overnight.  Sleeping better.   ROS: Patient denies fever, rash, sore throat, blurred vision, nausea, vomiting, diarrhea, cough, shortness of breath or chest pain, joint or back pain, headache, or mood change.   Objective:   No results found. Recent Labs    03/25/21 0504  WBC 17.2*  HGB 15.8  HCT 47.9  PLT 583*    Recent Labs    03/23/21 0458 03/25/21 0504  NA 138 139  K 4.0 4.5  CL 104 103  CO2 28 27  GLUCOSE 91 98  BUN 13 17  CREATININE 0.87 1.00  CALCIUM 8.8* 9.3    Intake/Output Summary (Last 24 hours) at 03/25/2021 0917 Last data filed at 03/25/2021 0513 Gross per 24 hour  Intake 220 ml  Output 1835 ml  Net -1615 ml        Physical Exam: Vital Signs Blood pressure 111/84, pulse 75, temperature (!) 97.4 F (36.3 C), temperature source Oral, resp. rate 18, height 6' (1.829 m), weight 95.7 kg, SpO2 98 %. Constitutional: No distress . Vital signs reviewed. HEENT: NCAT, EOMI, oral membranes moist Neck: supple Cardiovascular: RRR without murmur. No JVD    Respiratory/Chest: CTA Bilaterally without wheezes or rales. Normal effort    GI/Abdomen: BS +, non-tender, non-distended Ext: no clubbing, cyanosis, or edema Psych: pleasant and cooperative. No hallucination  Skin: scattered abrasions Musc: No edema in extremities. LE edema. Right knee and left wrist minimally tender Neuro: Alert and oriented, except for day, month. Insight and awareness still impaired. Normal language.  Motor: RUE 5/5.  RLE: 4/5   LUE: 4-/5 proximal to distal.  LLE: 3+/5 HF, KE, 2-3/5 ADF  Assessment/Plan: 1. Functional deficits which require 3+ hours per day of interdisciplinary therapy in a comprehensive inpatient rehab setting. Physiatrist is providing close team supervision and 24 hour management of active medical problems listed below. Physiatrist and rehab team continue to  assess barriers to discharge/monitor patient progress toward functional and medical goals  Care Tool:  Bathing    Body parts bathed by patient: Left arm, Chest, Abdomen, Front perineal area, Face, Right arm, Buttocks, Right upper leg, Left upper leg, Right lower leg, Left lower leg   Body parts bathed by helper: Right arm, Buttocks, Right upper leg, Left upper leg, Right lower leg, Left lower leg     Bathing assist Assist Level: Contact Guard/Touching assist     Upper Body Dressing/Undressing Upper body dressing   What is the patient wearing?: Pull over shirt    Upper body assist Assist Level: Supervision/Verbal cueing    Lower Body Dressing/Undressing Lower body dressing      What is the patient wearing?: Pants, Incontinence brief     Lower body assist Assist for lower body dressing: Minimal Assistance - Patient > 75%     Toileting Toileting    Toileting assist Assist for toileting: Minimal Assistance - Patient > 75%     Transfers Chair/bed transfer  Transfers assist  Chair/bed transfer activity did not occur: Safety/medical concerns  Chair/bed transfer assist level: Moderate Assistance - Patient 50 - 74%     Locomotion Ambulation   Ambulation assist  Ambulation activity did not occur: Safety/medical concerns          Walk 10 feet activity   Assist  Walk 10 feet activity did not occur: Safety/medical concerns        Walk 50 feet activity   Assist Walk 50 feet with 2 turns activity did not occur: Safety/medical concerns         Walk 150 feet activity   Assist Walk 150 feet activity did not occur: Safety/medical concerns         Walk 10 feet on uneven surface  activity   Assist Walk 10 feet on uneven surfaces activity did not occur: Safety/medical concerns         Wheelchair     Assist     Wheelchair activity did not occur: Safety/medical concerns (L wrist edema and pain, awaiting x-ray results, NWB during eval)          Wheelchair 50 feet with 2 turns activity    Assist    Wheelchair 50 feet with 2 turns activity did not occur: Safety/medical concerns       Wheelchair 150 feet activity     Assist  Wheelchair 150 feet activity did not occur: Safety/medical concerns       Blood pressure 111/84, pulse 75, temperature (!) 97.4 F (36.3 C), temperature source Oral, resp. rate 18, height 6' (1.829 m), weight 95.7 kg, SpO2 98 %.  Medical Problem List and Plan: 1.  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  Antithrombotics: -DVT/anticoagulation:  Pharmaceutical: Other (comment)--transitioned to Eliquis on 09/08             -antiplatelet therapy: N/A 3. Left shoulder/right knee pain/Pain Management: Local measures with Voltaren gel/ice/heat. Favors knee in weight bearing             --xrays demonstrates effusion seen on exam.   -acute gout right knee and left wrist--responsive to prednisone  -prednisone 10mg  thru today 9/16 and then stop 4. Mood: LCSW to follow for evaluation and support.              -antipsychotic agents: N/A 5. Neuropsych: This patient is not  capable of making decisions on his own behalf. 6. Skin/Wound Care: Routine pressure relief measures.  7. Fluids/Electrolytes/Nutrition: encourage PO -hypokalemia: supplement -protein supp for low albumin  K+ 3.3 on 9/12,-->4.0 after supplementation->4.5 9/16  -continue kdur at 10/16 daily   8. 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              --Monitor HR TID--continue Eliquis and Lopressor  - added norvasc with improvement 9. HTN: Monitor BP TID--on avapro, Lopressor   Increased norvasc to 10mg  on 9/13 with improvement.  10. RUE shaking movements: Felt to be postural tremor and Keppra d/c 09/06 11. Leucocytosis:   -blood cx staph epi (contaminate) on acute -UA negative and UC with insignificant growth WBCs 15.7 on 9/12-->17.2  9/16  -still think this is steroid effect  -jt pain better, no s/s infection  -recheck cbc monday 12. Hyperlipidemia: Now on Lipitor    LOS: 8 days A FACE TO FACE EVALUATION WAS PERFORMED  10-24-1978 03/25/2021, 9:17 AM

## 2021-03-25 NOTE — Progress Notes (Signed)
Physical Therapy Session Note  Patient Details  Name: Patrick Padilla MRN: [id number] Date of Birth: Sep 24, 1955  Today's Date: 03/25/2021 PT Individual Time: 1005-1045 PT Individual Time Calculation (min): 40 min   Short Term Goals: Week 2:  PT Short Term Goal 1 (Week 2): Patient will perform basic transfers with CGA consistently using LRAD. PT Short Term Goal 2 (Week 2): Patient will ambulate >200 feet with CGA demonstrating visual scanning strategies to improve L attention with min cues. PT Short Term Goal 3 (Week 2): Patient will perform dynamic standing balance >5 min with CGA without upper extremity support. PT Short Term Goal 4 (Week 2): Patient will improve score on Berg Balance Scale >4 points to meet MCID.  Skilled Therapeutic Interventions/Progress Updates: Pt presented in bed agreeable to therapy. Pt c/o back pain 6/10. Per pt received pain meds and hot pack with minimal relief. Pt agreeable to try lumbar stretches with PTA obtaining red physioball and performing heel to chest x 10 with 3-5 sec hold at end range. Pt initially with limited ROM primarily due to back tightness but improved with repetition. Pt also performed LTR with red physioball with pt only tolerating small range. Performed bed mobility from flat bed with supervision and performed AAROM LAQ x10 with sustained hamstring stretch 1 min x 2 bilaterally. Pt agreeable to ambulation and ambulated to day room with RW and CGA ~139f. Pt initially demonstrating L/R scanning however with increased distractions pt noted to only scan to right. Pt participated in NuStep x 9 minutes on L5 for reciprocal activity and PTA engaging pt in meaningful conversation. PTA cued pt to sustain task of NuStep only once throughout activity. After brief rest, pt ambulated back to room in same manner as prior and returned to bed distant supervision. Pt states improved relief in back pain but did not rate. Pt left in bed at end of session with bed alarm on,  call bell within reach and needs met.      Therapy Documentation Precautions:  Precautions Precautions: Fall Precaution Comments: impulsive Restrictions Weight Bearing Restrictions: No Other Position/Activity Restrictions: Edema and pain with weight bearing for R knee and L wrist General:   Vital Signs: Therapy Vitals Pulse Rate: 88 BP: 103/77 Patient Position (if appropriate): Lying Oxygen Therapy SpO2: 96 % O2 Device: Room Air Pain: Pain Assessment Pain Scale: Faces Pain Score: Asleep Faces Pain Scale: Hurts little more Pain Type: Acute pain Pain Location: Back Pain Orientation: Lower Pain Intervention(s): Medication (See eMAR) Mobility:   Locomotion :    Trunk/Postural Assessment :    Balance:   Exercises:   Other Treatments:      Therapy/Group: Individual Therapy  Gail Creekmore 03/25/2021, 12:24 PM

## 2021-03-25 NOTE — Progress Notes (Signed)
Speech Language Pathology Weekly Progress and Session Note  Patient Details  Name: Patrick Padilla MRN: [id number] Date of Birth: 26-Oct-1955  Beginning of progress report period: March 18, 2021 End of progress report period: March 25, 2021  Today's Date: 03/25/2021 SLP Individual Time: 1050-1130 SLP Individual Time Calculation (min): 40 min  Short Term Goals: Week 1: SLP Short Term Goal 1 (Week 1): Patient will demonstrate orientation to place, time and situation with use of external aids with Min verbal cues. SLP Short Term Goal 1 - Progress (Week 1): Met SLP Short Term Goal 2 (Week 1): Patient will demonstrate sustained attention to functional tasks for ~30 minutes with Mod verbal cues for redirection. SLP Short Term Goal 2 - Progress (Week 1): Met SLP Short Term Goal 3 (Week 1): Patient will demonstrate functional problem solving for basic and familiar tasks with Min verbal cues. SLP Short Term Goal 3 - Progress (Week 1): Not met SLP Short Term Goal 4 (Week 1): Patient will self-monitor and correct errors during functional tasks with Mod verbal cues. SLP Short Term Goal 4 - Progress (Week 1): Met SLP Short Term Goal 5 (Week 1): Patient will consume current diet with minimal overt s/s of aspiration with Min verbal cues for use of swallowing compensatory strategies. SLP Short Term Goal 5 - Progress (Week 1): Met    New Short Term Goals: Week 2: SLP Short Term Goal 1 (Week 2): Patient will demonstrate efficient mastication and complete oral clearance with trials of regular textures with supervision level verbal cues over 2 sessions without overt s/s of aspiration prior to upgrade. SLP Short Term Goal 2 (Week 2): Patient will self-monitor and correct errors during functional tasks with Min verbal cues. SLP Short Term Goal 3 (Week 2): Patient will demonstrate functional problem solving for basic and familiar tasks with Min verbal cues. SLP Short Term Goal 4 (Week 2): Patient will  demonstrate sustained attention to functional tasks for ~30 minutes with Min verbal cues for redirection. SLP Short Term Goal 5 (Week 2): Patient will demonstrate orientation to place, time and situation with Mod I for use of external aids.  Weekly Progress Updates: Patient has made functional gains and has met 4 of 5 STGs this reporting period. Currently, patient is consuming Dys. 3 textures with thin liquids with minimal overt s/s of aspiration and overall intermittent supervision level verbal cues for use of swallowing compensatory strategies. Patient is consuming trials of regular textures with focus on upgrade within the next reporting period. Patient also demonstrates improved overall cognitive functioning and requires overall supervision verbal cues for use of external aids for orientation, Min A verbal cues for attention and Mod A verbal cues for problem solving and error awareness. Patient and family education ongoing. Patient would benefit from continued skilled SLP intervention to maximize his cognitive and swallowing function prior to discharge.      Intensity: Minumum of 1-2 x/day, 30 to 90 minutes Frequency: 3 to 5 out of 7 days Duration/Length of Stay: 04/01/21 Treatment/Interventions: Cognitive remediation/compensation;Cueing hierarchy;Functional tasks;Environmental controls;Internal/external aids;Dysphagia/aspiration precaution training;Therapeutic Activities;Patient/family education   Daily Session  Skilled Therapeutic Interventions: Skilled treatment session focused on dysphagia and cognitive goals. SLP facilitated session by providing trials of regular textures. Patient demonstrated efficient but mildly prolonged mastication with mild oral residue that cleared with a liquid wash. Recommend trial tray prior to upgrade. Patient requested to call his soon and required total A for problem solving as he was not turning the phone on/off appropriately when having  to start over after  difficulty dialing the number. Patient also attempted to participate in a complex medication management task but required Max A for sustained attention and problem solving throughout. Patient left upright in bed with alarm on and all needs within reach. Continue with current plan of care.      Pain No/Denies Pain   Therapy/Group: Individual Therapy  PAYNE, COURTNEY 03/25/2021, 6:46 AM

## 2021-03-25 NOTE — Progress Notes (Signed)
Occupational Therapy Session Note  Patient Details  Name: Patrick Padilla MRN: [id number] Date of Birth: [birth year]  Today's Date: 03/25/2021 OT Individual Time: 1345-1433 OT Individual Time Calculation (min): 48 min    Short Term Goals: Week 1:  OT Short Term Goal 1 (Week 1): Pt will sit EOB with Supervision dynamically for 5 min OT Short Term Goal 2 (Week 1): Pt will don shirt wiht MIN A OT Short Term Goal 3 (Week 1): Pt will thread 1LE into pants wiht A for sitting balance only OT Short Term Goal 4 (Week 1): Pt will locate grooming items at sink with no cuing  Skilled Therapeutic Interventions/Progress Updates:     Pt received in bed with no report of pain. Heat reported to really help back  ADL:  Toileting completed with CGA with skilled intervention on direct VC for RW management to/from bathroom and unilateral support on walker when managing clothing  Therapeutic activity Recliner transfer with CGA over carpet with RW with Vc for feet inside RW during turns and keeping walker close to pt when backing up to chair  Pt searches for numbers 1-10 in dayroom for L attention and functional mobility with RW at amb level. Pt requires less cuing than during functional activity (I.e. shower/locating BADL items) in structured task for L attention.  Pt left at end of session in bed with exit alarm on, call light in reach and all needs met   Therapy Documentation Precautions:  Precautions Precautions: Fall Precaution Comments: impulsive Restrictions Weight Bearing Restrictions: No Other Position/Activity Restrictions: Edema and pain with weight bearing for R knee and L wrist General:   Therapy/Group: Individual Therapy  Tonny Branch 03/25/2021, 6:47 AM

## 2021-03-25 NOTE — Progress Notes (Signed)
Occupational Therapy Weekly Progress Note  Patient Details  Name: Patrick Padilla MRN: [id number] Date of Birth: 1956-06-17  Beginning of progress report period: March 18, 2021 End of progress report period: March 25, 2021  Today's Date: 03/25/2021 OT Individual Time: 8828-0034 OT Individual Time Calculation (min): 48 min   Patrick Padilla is showing continued potential for skilled therapy by improvements in motor planning, processing and participation in sessions. Current deficits include inattention to the L side, decreased visual perceptual skills and external distractibility which limits safe performance in BADLs. Pt frequently requires redirection and orientation to task, needs frequent cuing for functional mobility with RW and often demonstrates severe deficits in safety awareness. Pt has gone from MAX A at eval to [facility] for BADLs. Currently completing functional mobility with RW.   Patient has met 4 of 4 short term goals.    Patient continues to demonstrate the following deficits: muscle weakness and muscle joint tightness, decreased cardiorespiratoy endurance, impaired timing and sequencing, decreased coordination, and decreased motor planning, decreased visual acuity, decreased visual perceptual skills, and decreased visual motor skills, decreased midline orientation, decreased attention to left, left side neglect, and decreased motor planning, and decreased initiation, decreased awareness, decreased problem solving, decreased safety awareness, decreased memory, and delayed processing and therefore will continue to benefit from skilled OT intervention to enhance overall performance with BADL and Reduce care partner burden.  Patient progressing toward long term goals..  Continue plan of care.   OT Short Term Goals Week 1:  OT Short Term Goal 1 (Week 1): Pt will sit EOB with Supervision dynamically for 5 min OT Short Term Goal 2 (Week 1): Pt will don shirt wiht MIN A OT Short Term Goal 3 (Week  1): Pt will thread 1LE into pants wiht A for sitting balance only OT Short Term Goal 4 (Week 1): Pt will locate grooming items at sink with no cuing    Skilled Therapeutic Interventions/Progress Updates:    Pt received supine in bed, c/o 3/10 pain in back and saying he was tired from yesterday. Agreeable to OT session. Session focused on self-care activities and redirection attention to L side due to significant deficits. Continuously needs cuing for management of RW - pt does not keep feet centered or walker in front when turning, needs frequent reminders for safe hand placement. Demonstrated perseverative behaviors on random objects and easily distracted by external events, or by own thoughts. Pt needed additional time during tx for rest breaks due to pain/stiffness in back. Completed bed mobility supine > EOB with bed in level position, required heavy use of bed rails. Sit > stand using RW completed with min A for pushing up from bed. MAX A multimodal cuing required for functional ambulation into bathroom. Pt expressed need to urinate, and voided in standing with use of grab rail. Heavy redirection required to turn to L side to sit in shower chair. Showered with MAX directional cuing provided for location of items (intentionally set up on L side). Able to wash hair/body appropriately with min VC. Did not spend as much time fiddling with random objects today. The rest of the session focused on dressing and grooming activities. Grooming tasks completed at sink level in wc. Pt donned shirt with setup, but required increased time for processing and initiation. Could not initiate steps to put on pants, and demonstrated severe deficits in orientation of front/back. Skilled cuing provided for orientation to self and initiation. Could not locate back of pants and needed multimodal cuing to setup  clothing. Heat packs provided for pain relief. Skin check normal prior to application, and pillowcase placed over blue  scrub top as protective barrier. Pt left supine in bed, alarm on and call bell nearby with needs met.   Therapy Documentation Precautions:  Precautions Precautions: Fall Precaution Comments: impulsive Restrictions Weight Bearing Restrictions: No Other Position/Activity Restrictions: Edema and pain with weight bearing for R knee and L wrist  Therapy/Group: Individual Therapy  Patrick Padilla 03/25/2021, 6:44 AM

## 2021-03-26 DIAGNOSIS — R1310 Dysphagia, unspecified: Secondary | ICD-10-CM | POA: Diagnosis not present

## 2021-03-26 DIAGNOSIS — E876 Hypokalemia: Secondary | ICD-10-CM | POA: Diagnosis not present

## 2021-03-26 DIAGNOSIS — I10 Essential (primary) hypertension: Secondary | ICD-10-CM | POA: Diagnosis not present

## 2021-03-26 DIAGNOSIS — D72829 Elevated white blood cell count, unspecified: Secondary | ICD-10-CM | POA: Diagnosis not present

## 2021-03-26 DIAGNOSIS — I63511 Cerebral infarction due to unspecified occlusion or stenosis of right middle cerebral artery: Secondary | ICD-10-CM | POA: Diagnosis not present

## 2021-03-26 NOTE — Progress Notes (Signed)
Occupational Therapy Session Note  Patient Details  Name: Patrick Padilla MRN: [id number] Date of Birth: 05/03/1956  Today's Date: 03/26/2021 OT Individual Time: 1300-1415 OT Individual Time Calculation (min): 75 min  and Today's Date: 03/26/2021 OT Missed Time: 30 Minutes Missed Time Reason: Patient fatigue   Short Term Goals: Week 1:  OT Short Term Goal 1 (Week 1): Pt will sit EOB with Supervision dynamically for 5 min OT Short Term Goal 2 (Week 1): Pt will don shirt wiht MIN A OT Short Term Goal 3 (Week 1): Pt will thread 1LE into pants wiht A for sitting balance only OT Short Term Goal 4 (Week 1): Pt will locate grooming items at sink with no cuing  Skilled Therapeutic Interventions/Progress Updates:     Pt received in bed with no pain reported and declining pain heat at end of session  ADL:  Pt completes bathing with supervision sit to stand in shower with cuing for pacing as pt often distracted by water. VC also for seated rinsing of body after peri bathing. Pt with incontinent BM in shower seated on BSC. When asked if pt could feel BM need, pt stated "yes, but I normally dont announce it." Pt requires min cuing with items on L this date Pt completes UB dressing with VC for initiatio of locating dressing items on L Pt completes LB dressing with supervision at sit to stand level at sink in w/c Pt completes footwear with VC for intititation of scanning to locate socks on L Pt completes shower/Tub transfer with supervision using RW with VC for RW management as pt often wants to push it to side and pivot to w/c without it.    Therapeutic activity Pt completes functional mobility to dayroom with RW and frequent times L foot kicks L of walker. Pt with blocked practice of ambaultory transfers to different surfaces/chairs in dayroom for reinforcement of safe RW management and reach back to chair x10. Pt navigates to nurses station with min cuing for pathfinding to locate preferred snack for  visual scanning.   Pt left at end of session in bed with exit alarm on, call light in reach and all needs met. Pt requesting to lay down and rest missing 30 min skilled OT. Will attempt to make up as available per POC.  Later in the afternoon:  OT attempted to follow up later in the day and pt politely declined making up missed time d/t fatigue.    Therapy Documentation Precautions:  Precautions Precautions: Fall Precaution Comments: impulsive Restrictions Weight Bearing Restrictions: No Other Position/Activity Restrictions: Edema and pain with weight bearing for R knee and L wrist   Therapy/Group: Individual Therapy  Tonny Branch 03/26/2021, 6:57 AM

## 2021-03-26 NOTE — Progress Notes (Signed)
PROGRESS NOTE   Subjective/Complaints: Patient seen laying in bed this morning.  He states he slept well overnight.  He states he wants to go home.  He denies complaints.  ROS: Denies CP, SOB, N/V/D  Objective:   No results found. Recent Labs    03/25/21 0504  WBC 17.2*  HGB 15.8  HCT 47.9  PLT 583*     Recent Labs    03/25/21 0504  NA 139  K 4.5  CL 103  CO2 27  GLUCOSE 98  BUN 17  CREATININE 1.00  CALCIUM 9.3     Intake/Output Summary (Last 24 hours) at 03/26/2021 0918 Last data filed at 03/26/2021 0819 Gross per 24 hour  Intake 737 ml  Output --  Net 737 ml         Physical Exam: Vital Signs Blood pressure 110/69, pulse 79, temperature 98.3 F (36.8 C), temperature source Oral, resp. rate 16, height 6' (1.829 m), weight 95.7 kg, SpO2 98 %. Constitutional: No distress . Vital signs reviewed. HENT: Normocephalic.  Atraumatic. Eyes: EOMI. No discharge. Cardiovascular: No JVD.  RRR. Respiratory: Normal effort.  No stridor.  Bilateral clear to auscultation. GI: Non-distended.  BS +. Skin: Warm and dry.  Scattered abrasions healing Psych: Normal mood.  Normal behavior. Musc: No edema in extremities.  No tenderness in extremities. Neuro: Alert and oriented, except for day of the month, stable Motor: RUE 5/5.  RLE: 4/5, stable LUE: 4-/5 proximal to distal.  LLE: 3+/5 HF, KE, 2-3/5 ADF  Assessment/Plan: 1. Functional deficits which require 3+ hours per day of interdisciplinary therapy in a comprehensive inpatient rehab setting. Physiatrist is providing close team supervision and 24 hour management of active medical problems listed below. Physiatrist and rehab team continue to assess barriers to discharge/monitor patient progress toward functional and medical goals  Care Tool:  Bathing    Body parts bathed by patient: Right arm, Left arm, Chest, Buttocks, Right upper leg, Front perineal area,  Abdomen, Left upper leg, Right lower leg, Left lower leg, Face   Body parts bathed by helper: Right arm, Buttocks, Right upper leg, Left upper leg, Right lower leg, Left lower leg     Bathing assist Assist Level: Contact Guard/Touching assist     Upper Body Dressing/Undressing Upper body dressing   What is the patient wearing?: Pull over shirt    Upper body assist Assist Level: Supervision/Verbal cueing    Lower Body Dressing/Undressing Lower body dressing      What is the patient wearing?: Pants, Incontinence brief     Lower body assist Assist for lower body dressing: Supervision/Verbal cueing (multimodal cuing for orientation of pants to self)     Toileting Toileting    Toileting assist Assist for toileting: Minimal Assistance - Patient > 75%     Transfers Chair/bed transfer  Transfers assist  Chair/bed transfer activity did not occur: Safety/medical concerns  Chair/bed transfer assist level: Contact Guard/Touching assist     Locomotion Ambulation   Ambulation assist   Ambulation activity did not occur: Safety/medical concerns  Assist level: Contact Guard/Touching assist Assistive device: Walker-rolling Max distance: ~166ft   Walk 10 feet activity   Assist  Walk  10 feet activity did not occur: Safety/medical concerns  Assist level: Contact Guard/Touching assist Assistive device: Walker-rolling   Walk 50 feet activity   Assist Walk 50 feet with 2 turns activity did not occur: Safety/medical concerns  Assist level: Contact Guard/Touching assist Assistive device: Walker-rolling    Walk 150 feet activity   Assist Walk 150 feet activity did not occur: Safety/medical concerns         Walk 10 feet on uneven surface  activity   Assist Walk 10 feet on uneven surfaces activity did not occur: Safety/medical concerns         Wheelchair     Assist     Wheelchair activity did not occur: Safety/medical concerns (L wrist edema and pain,  awaiting x-ray results, NWB during eval)         Wheelchair 50 feet with 2 turns activity    Assist    Wheelchair 50 feet with 2 turns activity did not occur: Safety/medical concerns       Wheelchair 150 feet activity     Assist  Wheelchair 150 feet activity did not occur: Safety/medical concerns       Blood pressure 110/69, pulse 79, temperature 98.3 F (36.8 C), temperature source Oral, resp. rate 16, height 6' (1.829 m), weight 95.7 kg, SpO2 98 %.  Medical Problem List and Plan: 1.  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  Antithrombotics: -DVT/anticoagulation:  Pharmaceutical: Other (comment)--transitioned to Eliquis on 09/08             -antiplatelet therapy: N/A 3. Left shoulder/right knee pain/Pain Management: Local measures with Voltaren gel/ice/heat. Favors knee in weight bearing             --xrays demonstrates effusion seen on exam.   -acute gout right knee and left wrist--responsive to prednisone  -prednisone 10mg  completed on 9/16  4. Mood: LCSW to follow for evaluation and support.              -antipsychotic agents: N/A 5. Neuropsych: This patient is not  capable of making decisions on his own behalf. 6. Skin/Wound Care: Routine pressure relief measures.  7. Fluids/Electrolytes/Nutrition: encourage PO -protein supp for low albumin  Potassium 4.5 on 9/16  kdur at 10/16 daily DC'd on 9/18   8. 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              --Monitor HR TID--continue Eliquis and Lopressor  - added norvasc with improvement  Controlled on 9/17 9. HTN: Monitor BP TID--on avapro, Lopressor   Increased norvasc to 10mg   Controlled on 9/17 10. RUE shaking movements: Felt to be postural tremor and Keppra d/c 09/06 11. Leucocytosis:   -blood cx staph epi (contaminate) on acute -UA negative and UC with insignificant growth WBCs 17.2 on 9/16 Afebrile, no s/s infection Of liver for Monday 12. Hyperlipidemia:  Now on Lipitor   13.  Dysphagia  D3 thins, advance as tolerated  LOS: 9 days A FACE TO FACE EVALUATION WAS PERFORMED  Chevonne Bostrom 10/16 03/26/2021, 9:18 AM

## 2021-03-26 NOTE — Progress Notes (Signed)
Speech Language Pathology TBI Note  Patient Details  Name: Patrick Padilla MRN: [id number] Date of Birth: 07-Aug-1955  Today's Date: 03/26/2021 SLP Individual Time: 4944-9675 SLP Individual Time Calculation (min): 55 min  Short Term Goals: Week 2: SLP Short Term Goal 1 (Week 2): Patient will demonstrate efficient mastication and complete oral clearance with trials of regular textures with supervision level verbal cues over 2 sessions without overt s/s of aspiration prior to upgrade. SLP Short Term Goal 2 (Week 2): Patient will self-monitor and correct errors during functional tasks with Min verbal cues. SLP Short Term Goal 3 (Week 2): Patient will demonstrate functional problem solving for basic and familiar tasks with Min verbal cues. SLP Short Term Goal 4 (Week 2): Patient will demonstrate sustained attention to functional tasks for ~30 minutes with Min verbal cues for redirection. SLP Short Term Goal 5 (Week 2): Patient will demonstrate orientation to place, time and situation with Mod I for use of external aids.  Skilled Therapeutic Interventions: Skilled treatment session focused on cognitive goals. SLP facilitated session by providing the patient with a list of his current medications. Patient could independently recall the function of his medications with 10% accuracy. Patient organized a BID pill box with Min verbal cues needed for error awareness. Task was unable to be completed due to time constraints and patient needing to use the bathroom. Patient ambulated with the RW with Min verbal cues needed for safety. Patient was continent of bladder. Patient left upright in bed with alarm on and all needs within reach. Continue with current plan of care.      Pain Pain Assessment Pain Scale: 0-10 Pain Score: 0-No pain  Agitated Behavior Scale: TBI Observation Details Observation Environment: Patient's room Start of observation period - Date: 03/26/21 Start of observation period - Time:  1030 End of observation period - Date: 03/26/21 End of observation period - Time: 1125 Agitated Behavior Scale (DO NOT LEAVE BLANKS) Short attention span, easy distractibility, inability to concentrate: Absent Impulsive, impatient, low tolerance for pain or frustration: Absent Uncooperative, resistant to care, demanding: Absent Violent and/or threatening violence toward people or property: Absent Explosive and/or unpredictable anger: Absent Rocking, rubbing, moaning, or other self-stimulating behavior: Absent Pulling at tubes, restraints, etc.: Absent Wandering from treatment areas: Absent Restlessness, pacing, excessive movement: Absent Repetitive behaviors, motor, and/or verbal: Absent Rapid, loud, or excessive talking: Absent Sudden changes of mood: Absent Easily initiated or excessive crying and/or laughter: Absent Self-abusiveness, physical and/or verbal: Absent Agitated behavior scale total score: 14  Therapy/Group: Individual Therapy  Zanovia Rotz 03/26/2021, 12:04 PM

## 2021-03-27 NOTE — Progress Notes (Signed)
Speech Language Pathology Daily Session Note  Patient Details  Name: Patrick Padilla MRN: [id number] Date of Birth: [birth year]  Today's Date: 03/27/2021 SLP Individual Time: 2505-3976 SLP Individual Time Calculation (min): 45 min  Short Term Goals: Week 2: SLP Short Term Goal 1 (Week 2): Patient will demonstrate efficient mastication and complete oral clearance with trials of regular textures with supervision level verbal cues over 2 sessions without overt s/s of aspiration prior to upgrade. SLP Short Term Goal 2 (Week 2): Patient will self-monitor and correct errors during functional tasks with Min verbal cues. SLP Short Term Goal 3 (Week 2): Patient will demonstrate functional problem solving for basic and familiar tasks with Min verbal cues. SLP Short Term Goal 4 (Week 2): Patient will demonstrate sustained attention to functional tasks for ~30 minutes with Min verbal cues for redirection. SLP Short Term Goal 5 (Week 2): Patient will demonstrate orientation to place, time and situation with Mod I for use of external aids.  Skilled Therapeutic Interventions:  Pt was seen for skilled ST targeting cognitive goals.  Upon arrival, pt was awake, alert, and agreeable to participating in treatment.  Pt requested to go to the bathroom when asked and demonstrated no overt safety concerns when ambulating to the bathroom with the rolling walker and min assist.  Pt needed min assist verbal cues to use the call bell to request pain meds for shoulder pain.  SLP facilitated the session with a medication management task that was initiated in a previous treatment session.  Pt needed min verbal cues to organize pills into a twice daily pill box with the strategy of starting on one side of the pill box and working straight to the other side rather than jumping around.  Pt was able to recognize and correct errors with mod verbal cues.  Pt was left in bed with bed alarm set and call bell within reach.  Continue per current  plan of care.    Pain Pain Assessment Pain Scale: 0-10 Pain Score: 6  Pain Type: Acute pain Pain Location: Shoulder Pain Orientation: Left Pain Descriptors / Indicators: Aching Pain Intervention(s): RN made aware  Therapy/Group: Individual Therapy  Giulliana Mcroberts, Melanee Spry 03/27/2021, 3:42 PM

## 2021-03-27 NOTE — Progress Notes (Signed)
Physical Therapy Session Note  Patient Details  Name: Patrick Padilla MRN: [id number] Date of Birth: October 16, 1955  Today's Date: 03/27/2021 PT Individual Time: 1120-1205 PT Individual Time Calculation (min): 45 min    Week 2:  PT Short Term Goal 1 (Week 2): Patient will perform basic transfers with CGA consistently using LRAD. PT Short Term Goal 2 (Week 2): Patient will ambulate >200 feet with CGA demonstrating visual scanning strategies to improve L attention with min cues. PT Short Term Goal 3 (Week 2): Patient will perform dynamic standing balance >5 min with CGA without upper extremity support. PT Short Term Goal 4 (Week 2): Patient will improve score on Berg Balance Scale >4 points to meet MCID.    Skilled Therapeutic Interventions/Progress Updates:  Pt asleep in bed.  He awakened easily.  He rated pain L shoulder "a little bit", premedicated.  neuromuscular re-education via demo, multimodal cues for supine: R/L straight leg raises, cervical flexion, pelvic tilts, alternating ankle pumps.  Dual task of counting aloud, which pt did with difficulty. Seated EOB, R/L lateral leans.  Rolling L and sitting EOB with supervision, flat bed, no rails.  Pt had difficulty pushing up with LUE.  He stated that he doesn't sleep in a bed at home; he sleeps in a recliner.  PT educated pt on benefits of sleeping in a bed rather than a recliner, for core strength and ROM.  Pt seemed receptive.  Sit> stand initially using bil UEs to push up caused L shoulder pain.  PT cued pt to stand without use of UEs, supervision, without pain.  Gait training with RW on level tile x 150' with supervision.  Dual task of locating a certain room located on L side of hall.  PT discussed pt's L inattention with him prior to gait training.  Pt missed the room due to complete lack of scanning L visually; max cues to problem solve how this has happened.  With practice, pt noted a large poster on L side of hall with min cues.   Ambulatory transfer with Rw to wc with supervision.   At end of session, pt seated in wc with needs at hand and seat belt alarm set.     Therapy Documentation Precautions:  Precautions Precautions: Fall Precaution Comments: impulsive Restrictions Weight Bearing Restrictions: No Other Position/Activity Restrictions: Edema and pain with weight bearing for R knee and L wrist       Therapy/Group: Individual Therapy  Arya Boxley 03/27/2021, 12:17 PM

## 2021-03-27 NOTE — Progress Notes (Signed)
Occupational Therapy Session Note  Patient Details  Name: Seiji Wiswell MRN: [id number] Date of Birth: 31-Aug-1955  Today's Date: 03/27/2021 OT Individual Time: 1300-1315 OT Individual Time Calculation (min): 15 min  and Today's Date: 03/27/2021 OT Missed Time: 30 Minutes Missed Time Reason: Patient fatigue   Short Term Goals: Week 1:  OT Short Term Goal 1 (Week 1): Pt will sit EOB with Supervision dynamically for 5 min OT Short Term Goal 2 (Week 1): Pt will don shirt wiht MIN A OT Short Term Goal 3 (Week 1): Pt will thread 1LE into pants wiht A for sitting balance only OT Short Term Goal 4 (Week 1): Pt will locate grooming items at sink with no cuing Week 2:     Skilled Therapeutic Interventions/Progress Updates:    1:1 Pt resting in bed. Tried to engage the patient in ADL tasks but declined. Pt aware of some of his deficits when asked; including his visual attention to the left and language. Pt came to sit EOB with supervision but declined to participate in activity out of bed due to fatigue. Tried to engage pt in pill box test with assistance. Pt reported it was too hard cause he was so tired and kept dozing off. Requested to take a nap. Allowed pt to lay back down and pt went to sleep.   Therapy Documentation Precautions:  Precautions Precautions: Fall Precaution Comments: impulsive Restrictions Weight Bearing Restrictions: No Other Position/Activity Restrictions: Edema and pain with weight bearing for R knee and L wrist  Pain:  No pain indicated    Therapy/Group: Individual Therapy  Roney Mans [facility] 03/27/2021, 2:59 PM

## 2021-03-28 DIAGNOSIS — M25461 Effusion, right knee: Secondary | ICD-10-CM | POA: Diagnosis not present

## 2021-03-28 DIAGNOSIS — I6783 Posterior reversible encephalopathy syndrome: Secondary | ICD-10-CM | POA: Diagnosis not present

## 2021-03-28 DIAGNOSIS — D72829 Elevated white blood cell count, unspecified: Secondary | ICD-10-CM | POA: Diagnosis not present

## 2021-03-28 DIAGNOSIS — M109 Gout, unspecified: Secondary | ICD-10-CM | POA: Diagnosis not present

## 2021-03-28 DIAGNOSIS — I10 Essential (primary) hypertension: Secondary | ICD-10-CM | POA: Diagnosis not present

## 2021-03-28 DIAGNOSIS — I63511 Cerebral infarction due to unspecified occlusion or stenosis of right middle cerebral artery: Secondary | ICD-10-CM | POA: Diagnosis not present

## 2021-03-28 LAB — CBC
HCT: 50.3 % (ref 39.0–52.0)
Hemoglobin: 16.7 g/dL (ref 13.0–17.0)
MCH: 33.9 pg (ref 26.0–34.0)
MCHC: 33.2 g/dL (ref 30.0–36.0)
MCV: 102 fL — ABNORMAL HIGH (ref 80.0–100.0)
Platelets: 563 10*3/uL — ABNORMAL HIGH (ref 150–400)
RBC: 4.93 MIL/uL (ref 4.22–5.81)
RDW: 12.5 % (ref 11.5–15.5)
WBC: 11.8 10*3/uL — ABNORMAL HIGH (ref 4.0–10.5)
nRBC: 0 % (ref 0.0–0.2)

## 2021-03-28 LAB — BASIC METABOLIC PANEL
Anion gap: 9 (ref 5–15)
BUN: 22 mg/dL (ref 8–23)
CO2: 27 mmol/L (ref 22–32)
Calcium: 9.1 mg/dL (ref 8.9–10.3)
Chloride: 100 mmol/L (ref 98–111)
Creatinine, Ser: 1.22 mg/dL (ref 0.61–1.24)
GFR, Estimated: >60 mL/min (ref >60)
Glucose, Bld: 116 mg/dL — ABNORMAL HIGH (ref 70–99)
Potassium: 5.2 mmol/L — ABNORMAL HIGH (ref 3.5–5.1)
Sodium: 136 mmol/L (ref 135–145)

## 2021-03-28 NOTE — Progress Notes (Signed)
Speech Language Pathology Daily Session Note  Patient Details  Name: Patrick Padilla MRN: [id number] Date of Birth: 1955-08-08  Today's Date: 03/28/2021 SLP Individual Time: 1440-1537 SLP Individual Time Calculation (min): 57 min  Short Term Goals: Week 2: SLP Short Term Goal 1 (Week 2): Patient will demonstrate efficient mastication and complete oral clearance with trials of regular textures with supervision level verbal cues over 2 sessions without overt s/s of aspiration prior to upgrade. SLP Short Term Goal 2 (Week 2): Patient will self-monitor and correct errors during functional tasks with Min verbal cues. SLP Short Term Goal 3 (Week 2): Patient will demonstrate functional problem solving for basic and familiar tasks with Min verbal cues. SLP Short Term Goal 4 (Week 2): Patient will demonstrate sustained attention to functional tasks for ~30 minutes with Min verbal cues for redirection. SLP Short Term Goal 5 (Week 2): Patient will demonstrate orientation to place, time and situation with Mod I for use of external aids.  Skilled Therapeutic Interventions: Patient agreeable to skilled ST intervention with focus on cognitive goals. SLP facilitated session by providing mod A verbal and visual cues for functional problem solving, sustained attention, and working memory during medication management task. Patient eventually required max A verbal and visual cues to complete task secondary to progressive attention and working memory difficulty likely attributed to fatigue. Pt identified medication administration errors via BID pillbox containing 2-3 prescription medications and generated appropriate solutions with 25% accuracy.Patient was left in bed with alarm activated and immediate needs within reach at end of session. Continue per current plan of care.      Pain - no pain    Therapy/Group: Individual Therapy  Tamala Ser 03/28/2021, 4:18 PM

## 2021-03-28 NOTE — Progress Notes (Signed)
PROGRESS NOTE   Subjective/Complaints: No new issues. Pain is controlled. Anxious to get home  ROS: Patient denies fever, rash, sore throat, blurred vision, nausea, vomiting, diarrhea, cough, shortness of breath or chest pain,  headache, or mood change.   Objective:   No results found. Recent Labs    03/28/21 0628  WBC 11.8*  HGB 16.7  HCT 50.3  PLT 563*    Recent Labs    03/28/21 0628  NA 136  K 5.2*  CL 100  CO2 27  GLUCOSE 116*  BUN 22  CREATININE 1.22  CALCIUM 9.1    Intake/Output Summary (Last 24 hours) at 03/28/2021 1046 Last data filed at 03/28/2021 0343 Gross per 24 hour  Intake 297 ml  Output 500 ml  Net -203 ml        Physical Exam: Vital Signs Blood pressure 118/87, pulse 77, temperature (!) 97.5 F (36.4 C), resp. rate 18, height 6' (1.829 m), weight 95.7 kg, SpO2 98 %. Constitutional: No distress . Vital signs reviewed. HEENT: NCAT, EOMI, oral membranes moist Neck: supple Cardiovascular: RRR without murmur. No JVD    Respiratory/Chest: CTA Bilaterally without wheezes or rales. Normal effort    GI/Abdomen: BS +, non-tender, non-distended Ext: no clubbing, cyanosis, or edema Psych: pleasant and cooperative  Skin: scattered abrasions Musc: No edema in extremities.  No tenderness in extremities. Neuro: Alert and oriented, except for day of the month, stable Motor: RUE 5/5.  RLE: 4/5, stable LUE: 4-/5 proximal to distal.  LLE: 3+/5 HF, KE, 2-3/5 ADF  Assessment/Plan: 1. Functional deficits which require 3+ hours per day of interdisciplinary therapy in a comprehensive inpatient rehab setting. Physiatrist is providing close team supervision and 24 hour management of active medical problems listed below. Physiatrist and rehab team continue to assess barriers to discharge/monitor patient progress toward functional and medical goals  Care Tool:  Bathing    Body parts bathed by patient:  Right arm, Left arm, Chest, Buttocks, Right upper leg, Front perineal area, Abdomen, Left upper leg, Right lower leg, Left lower leg, Face   Body parts bathed by helper: Right arm, Buttocks, Right upper leg, Left upper leg, Right lower leg, Left lower leg     Bathing assist Assist Level: Contact Guard/Touching assist     Upper Body Dressing/Undressing Upper body dressing   What is the patient wearing?: Pull over shirt    Upper body assist Assist Level: Supervision/Verbal cueing    Lower Body Dressing/Undressing Lower body dressing      What is the patient wearing?: Pants, Incontinence brief     Lower body assist Assist for lower body dressing: Supervision/Verbal cueing (multimodal cuing for orientation of pants to self)     Toileting Toileting    Toileting assist Assist for toileting: Minimal Assistance - Patient > 75%     Transfers Chair/bed transfer  Transfers assist  Chair/bed transfer activity did not occur: Safety/medical concerns  Chair/bed transfer assist level: Supervision/Verbal cueing     Locomotion Ambulation   Ambulation assist   Ambulation activity did not occur: Safety/medical concerns  Assist level: Supervision/Verbal cueing Assistive device: Walker-rolling Max distance: 150   Walk 10 feet activity  Assist  Walk 10 feet activity did not occur: Safety/medical concerns  Assist level: Supervision/Verbal cueing Assistive device: Walker-rolling   Walk 50 feet activity   Assist Walk 50 feet with 2 turns activity did not occur: Safety/medical concerns  Assist level: Supervision/Verbal cueing Assistive device: Walker-rolling    Walk 150 feet activity   Assist Walk 150 feet activity did not occur: Safety/medical concerns  Assist level: Supervision/Verbal cueing Assistive device: Walker-rolling    Walk 10 feet on uneven surface  activity   Assist Walk 10 feet on uneven surfaces activity did not occur: Safety/medical  concerns         Wheelchair     Assist     Wheelchair activity did not occur: Safety/medical concerns (L wrist edema and pain, awaiting x-ray results, NWB during eval)         Wheelchair 50 feet with 2 turns activity    Assist    Wheelchair 50 feet with 2 turns activity did not occur: Safety/medical concerns       Wheelchair 150 feet activity     Assist  Wheelchair 150 feet activity did not occur: Safety/medical concerns       Blood pressure 118/87, pulse 77, temperature (!) 97.5 F (36.4 C), resp. rate 18, height 6' (1.829 m), weight 95.7 kg, SpO2 98 %.  Medical Problem List and Plan: 1.  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  Antithrombotics: -DVT/anticoagulation:  Pharmaceutical: Other (comment)--transitioned to Eliquis on 09/08             -antiplatelet therapy: N/A 3. Left shoulder/right knee pain/Pain Management: Local measures with Voltaren gel/ice/heat. Favors knee in weight bearing             --xrays demonstrates effusion seen on exam.   -acute gout right knee and left wrist--responsive to prednisone  -prednisone 10mg  completed on 9/16  4. Mood: LCSW to follow for evaluation and support.              -antipsychotic agents: N/A 5. Neuropsych: This patient is not  capable of making decisions on his own behalf. 6. Skin/Wound Care: Routine pressure relief measures.  7. Fluids/Electrolytes/Nutrition: encourage PO -protein supp for low albumin  Potassium 5.2--now off supplement   8. 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              --Monitor HR TID--continue Eliquis and Lopressor  - added norvasc with improvement  Controlled on 9/17 9. HTN: Monitor BP TID--on avapro, Lopressor   Increased norvasc to 10mg   Fairly Controlled on 9/19 10. RUE shaking movements: Felt to be postural tremor and Keppra d/c 09/06 11. Leucocytosis:   -blood cx staph epi (contaminate) on  acute -UA negative and UC with insignificant growth WBCs 17.2 on 9/16-->11.8 9/19 Afebrile, no s/s infection   12. Hyperlipidemia: Now on Lipitor   13.  Dysphagia  D3 thins, advance as tolerated  LOS: 11 days A FACE TO FACE EVALUATION WAS PERFORMED  03-11-2004 03/28/2021, 10:46 AM

## 2021-03-28 NOTE — Progress Notes (Signed)
Occupational Therapy Session Note  Patient Details  Name: Patrick Padilla MRN: [id number] Date of Birth: [birth year]  Today's Date: 03/28/2021 OT Individual Time: 1115-1201   &  1330-1430 OT Individual Time Calculation (min): 46 min    &   60 min   Short Term Goals: Week 1:  OT Short Term Goal 1 (Week 1): Pt will sit EOB with Supervision dynamically for 5 min OT Short Term Goal 2 (Week 1): Pt will don shirt wiht MIN A OT Short Term Goal 3 (Week 1): Pt will thread 1LE into pants wiht A for sitting balance only OT Short Term Goal 4 (Week 1): Pt will locate grooming items at sink with no cuing Week 2:     Skilled Therapeutic Interventions/Progress Updates:    AM session:   Patient in bed, alert and ready for therapy session.  He is HOH but understands information when he is able to hear.  Denies hallucinations or pain at this time.  Supine to sitting edge of bed with CS.  Sit to stand and SPT with RW to w/c with CS.  Completes grooming, sponge bath and dressing at w/c level with CS and set up.  Ambulates on unit with RW > 150 feet x 2 with CS.   He returned to bed at close of session, bed alarm set and call bell in reach.     PM session:   Patient in bed, alert and ready for afternoon session.  Supine to sitting edge of bed with CS.  Sit to stand and ambulation with RW to therapy gym >300 feet with CS/CGA.   Completed visual scanning activities in stance using BITS system:  trial 1 A-Z, 1:35, rxn 3.66 sec, trail 2 #1-50 5:35, rxn 6.70 sec (note that left quadrants 2x longer to locate items)  he demonstrates some awareness of visual field deficit, able to complete paper and pencil task with 100% accuracy with increased time.   Sequencing task - 4 words 33% W/c back to room due to need to void - continent/incontinent of bowel due to urgency and loose BM - transfer w/ RW to toilet CGA, CM/hygiene with CS/CGA.  He returned to bed at close of session, bed alarm set and call bell in hand.       Therapy  Documentation Precautions:  Precautions Precautions: Fall Precaution Comments: impulsive Restrictions Weight Bearing Restrictions: No Other Position/Activity Restrictions: Edema and pain with weight bearing for R knee and L wrist   Therapy/Group: Individual Therapy  [name] 03/28/2021, 7:35 AM

## 2021-03-28 NOTE — Progress Notes (Signed)
Occupational Therapy Weekly Progress Note  Patient Details  Name: Patrick Padilla MRN: [id number] Date of Birth: May 07, 1956  Beginning of progress report period: March 18, 2021 End of progress report period: March 28, 2021     Patient has met 4 of 4 short term goals.  Patient with improving awareness, safety, balance and coordination impacting his functional performance with all self care and mobility  Patient continues to demonstrate the following deficits: muscle weakness, decreased cardiorespiratoy endurance, decreased coordination, hemianopsia, decreased attention to left, and decreased standing balance and therefore will continue to benefit from skilled OT intervention to enhance overall performance with BADL, iADL, and Reduce care partner burden.  Patient progressing toward long term goals..  Continue plan of care.  OT Short Term Goals Week 1:  OT Short Term Goal 1 (Week 1): Pt will sit EOB with Supervision dynamically for 5 min OT Short Term Goal 1 - Progress (Week 1): Met OT Short Term Goal 2 (Week 1): Pt will don shirt wiht MIN A OT Short Term Goal 2 - Progress (Week 1): Met OT Short Term Goal 3 (Week 1): Pt will thread 1LE into pants wiht A for sitting balance only OT Short Term Goal 3 - Progress (Week 1): Met OT Short Term Goal 4 (Week 1): Pt will locate grooming items at sink with no cuing OT Short Term Goal 4 - Progress (Week 1): Met Week 2:  OT Short Term Goal 1 (Week 2): Patient will complete funcitonal transfers with RW CS without cues OT Short Term Goal 2 (Week 2): patient will complete shower/bathing and dressing tasks with CS/set up OT Short Term Goal 3 (Week 2): patient will complete visual scanning tasks without cues or increased time to locate items on left   Carlos Levering 03/28/2021, 3:23 PM

## 2021-03-28 NOTE — Progress Notes (Signed)
Physical Therapy Session Note  Patient Details  Name: Patrick Padilla MRN: [id number] Date of Birth: November 03, 1955  Today's Date: 03/28/2021 PT Individual Time: 7989-2119 PT Individual Time Calculation (min): 50 min   Short Term Goals: Week 2:  PT Short Term Goal 1 (Week 2): Patient will perform basic transfers with CGA consistently using LRAD. PT Short Term Goal 2 (Week 2): Patient will ambulate >200 feet with CGA demonstrating visual scanning strategies to improve L attention with min cues. PT Short Term Goal 3 (Week 2): Patient will perform dynamic standing balance >5 min with CGA without upper extremity support. PT Short Term Goal 4 (Week 2): Patient will improve score on Berg Balance Scale >4 points to meet MCID.  Skilled Therapeutic Interventions/Progress Updates:     Patient in bed asleep upon PT arrival. Patient easily aroused and agreeable to PT session. Patient denied pain during session, reports improved knee pain today. Focused session on balance assessment and functional mobility to assess patient's mobility and reassess d/c date due to patient's accelerated progress with improved R knee pain. 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 Continues to demonstrate L inattention, decreased attention, mild impulsivity, and balance deficits and will benefit from continued skilled PT to improved safety and reduce fall risk at d/c.  Therapeutic Activity: Bed Mobility: Patient performed supine to/from sit with mod I using bed rail.  Transfers: Patient performed sit to/from stand x8 with supervision-mod I with or without a RW including a standing toilet transfer to void and seated toilet transfer for BM . Provided verbal cues for safety awareness with AD x2. Patient was continent of bowl and bladder during toileting, performed peri-care and lower body  clothing management independently, except, total A for re-securing incontinence brief for improved fit.  Gait Training:  Patient ambulated >200 feet x2 using RW with supervision. Ambulated with decreased gait speed, decreased step length and height, increased B hip and knee flexion in stance, mild forward trunk lean, and reduced to no visual scanning to the L. Provided verbal cues for erect posture, increased step height for safety, and increased L attention to objects or locating items to improve visual scanning. Patient ascended/descended 4 steps using R rail x2 with CGA. Performed reciprocal gait pattern on first attempt with increased postural sway using R hand only, on second attempt performed step-to gait pattern with B hands on the rail with improved stability. Provided cues for technique and sequencing.   Neuromuscular Re-ed: Patient performed the Berg Balance Test: Patient demonstrates increased fall risk as noted by score of 42/56 on Berg Balance Scale.  (<36= high risk for falls, close to 100%; 37-45 significant >80%; 46-51 moderate >50%; 52-55 lower >25%), improved from 10/56 on 9/12. Balance: Berg Balance Test Sit to Stand: Able to stand without using hands and stabilize independently Standing Unsupported: Able to stand safely 2 minutes Sitting with Back Unsupported but Feet Supported on Floor or Stool: Able to sit safely and securely 2 minutes Stand to Sit: Sits safely with minimal use of hands Transfers: Able to transfer safely, minor use of hands Standing Unsupported with Eyes Closed: Able to stand 10 seconds safely Standing Ubsupported with Feet Together: Needs help to attain position but able to stand for 30 seconds with feet together From Standing, Reach Forward with Outstretched Arm: Can reach forward >12 cm safely (5") From  Standing Position, Pick up Object from Floor: Able to pick up shoe safely and easily From Standing Position, Turn to Look Behind Over each Shoulder: Looks  behind from both sides and weight shifts well Turn 360 Degrees: Able to turn 360 degrees safely but slowly Standing Unsupported, Alternately Place Feet on Step/Stool: Able to complete >2 steps/needs minimal assist Standing Unsupported, One Foot in Front: Able to take small step independently and hold 30 seconds Standing on One Leg: Tries to lift leg/unable to hold 3 seconds but remains standing independently Total Score: 42/56  Patient in bed reporting increased fatigue at end of session, refused to continue session, despite encouragement and education on benefits of completing therapy session, due to wanting to "lay down to take a nap." Breaks locked, bed alarm set, and all needs within reach. Patient missed 10 min of skilled PT due to fatigue, RN made aware. Will attempt to make-up missed time as able.    Therapy Documentation Precautions:  Precautions Precautions: Fall Precaution Comments: impulsive Restrictions Weight Bearing Restrictions: No Other Position/Activity Restrictions: Edema and pain with weight bearing for R knee and L wrist General: PT Amount of Missed Time (min): 10 Minutes PT Missed Treatment Reason: Patient fatigue    Therapy/Group: Individual Therapy  Maddux First L Britt Petroni PT, DPT  03/28/2021, 3:56 PM

## 2021-03-28 NOTE — Progress Notes (Signed)
Bp 93/52 HR 70 held metoprolol 100mg , pt asymptomatic denies dizziness, or lightheadedness.

## 2021-03-29 DIAGNOSIS — D72829 Elevated white blood cell count, unspecified: Secondary | ICD-10-CM | POA: Diagnosis not present

## 2021-03-29 DIAGNOSIS — M109 Gout, unspecified: Secondary | ICD-10-CM | POA: Diagnosis not present

## 2021-03-29 DIAGNOSIS — I10 Essential (primary) hypertension: Secondary | ICD-10-CM | POA: Diagnosis not present

## 2021-03-29 DIAGNOSIS — I6783 Posterior reversible encephalopathy syndrome: Secondary | ICD-10-CM | POA: Diagnosis not present

## 2021-03-29 DIAGNOSIS — M25461 Effusion, right knee: Secondary | ICD-10-CM | POA: Diagnosis not present

## 2021-03-29 DIAGNOSIS — I63511 Cerebral infarction due to unspecified occlusion or stenosis of right middle cerebral artery: Secondary | ICD-10-CM | POA: Diagnosis not present

## 2021-03-29 NOTE — Progress Notes (Signed)
Physical Therapy Session Note  Patient Details  Name: Patrick Padilla MRN: [id number] Date of Birth: 03-14-1956  Today's Date: 03/29/2021 PT Individual Time: 0850-0944 PT Individual Time Calculation (min): 54 min   Short Term Goals: Week 2:  PT Short Term Goal 1 (Week 2): Patient will perform basic transfers with CGA consistently using LRAD. PT Short Term Goal 2 (Week 2): Patient will ambulate >200 feet with CGA demonstrating visual scanning strategies to improve L attention with min cues. PT Short Term Goal 3 (Week 2): Patient will perform dynamic standing balance >5 min with CGA without upper extremity support. PT Short Term Goal 4 (Week 2): Patient will improve score on Berg Balance Scale >4 points to meet MCID.  Skilled Therapeutic Interventions/Progress Updates:     Pt received supine in bed and agrees to therapy. No complaint of pain. PT threads pants on both legs and provides cues for pt to perform supine bridge to assist with donning pants. Pt is able to pull up from knee level without further assistance. Supine to sit with bed features and cues for positioning. Pt performs sit to stand and stand step transfer to [facility] with CGA and cues for sequencing. WC transport outside for ambulation over unlevel and varying surfaces. Pt ambulates 2x200' with RW and CGA with verbal cues to increase proximity to RW for safety and maintain upright gaze to improve posture and balance. Cues also provided for RW management when approaching bench for seated rest break. Pt verbalizes need to use restroom. WC transport back inside. Ambulatory transfer to toilet with RW and cues for positioning. Pt is continent of bowel and independent with pericare. Pt ambulates to sink with RW and cues for correct and safe use as pt attempts to leave RW in bathroom.   Pt transfers to nustep with CGA and stand pivot technique with cues for positioning. Pt completes x8:00 at workload of 5 with average steps per minute ~50. Performed  for strength and endurance training as well as reciprocal coordination. Stand pivot transfer to [facility] with CGA and cues for hand placement. Left seated with alarm intact and all needs within reach.  Therapy Documentation Precautions:  Precautions Precautions: Fall Precaution Comments: impulsive Restrictions Weight Bearing Restrictions: No Other Position/Activity Restrictions: Edema and pain with weight bearing for R knee and L wrist    Therapy/Group: Individual Therapy  Beau Fanny, PT, DPT 03/29/2021, 3:56 PM

## 2021-03-29 NOTE — Progress Notes (Addendum)
Patient ID: Patrick Padilla, male   DOB: 04-Sep-1955, 65 y.o.   MRN: [id number]  SW met with pt in room, and called his son Shanon Brow [phone] to provide updates from the team conference, and d/c date change from 9/29 to 9/24. Son intends to check on DME: RW, 3in1 BSC, and TTB. Will f/u with SW if items are needed. Preferred outpatient location is [facility]. Fam edu schedule for 9/23 1pm-3pm.  Loralee Pacas, MSW, [place] Office: [phone] Cell: [phone] Fax: [phone]

## 2021-03-29 NOTE — Progress Notes (Signed)
Occupational Therapy Session Note  Patient Details  Name: Patrick Padilla MRN: [id number] Date of Birth: [birth year]  Today's Date: 03/29/2021 OT Individual Time: 1100-1202 OT Individual Time Calculation (min): 62 min    Short Term Goals: Week 2:  OT Short Term Goal 1 (Week 2): Patient will complete funcitonal transfers with RW CS without cues OT Short Term Goal 2 (Week 2): patient will complete shower/bathing and dressing tasks with CS/set up OT Short Term Goal 3 (Week 2): patient will complete visual scanning tasks without cues or increased time to locate items on left  Skilled Therapeutic Interventions/Progress Updates:    Patient in bed, alert and asking to go home today.  He denies pain and states that his son is going to be here at any time.  Son arrived during session as patient had called and told his wife that he was being discharged today.  Reviewed plan and schedule for additional family education, reviewed DME recommendations - son to check at home to see what is still available from when [place] had his hip replaced.  Son able to observe end of bathing and dressing activities - provided verbal review of his current status, visual deficits and safety recommendations.  Patient completed bed mobility with CS, sit to stand and ambulation with RW to/from bed, shower bench, commode and w/c with CS and cues for safe use of RW.  Shower completed seated with CS and occ cues for safety.  Dressing completed seated on chair without arms - patient with balance challenge and occ cues on this surface for safety.  Able to complete dressing with CS and min cues.  Patient returned to w/c at close of session, seat belt alarm set and call bell in hand.  Son plans to return for FT session as scheduled on Friday.    Therapy Documentation Precautions:  Precautions Precautions: Fall Precaution Comments: impulsive Restrictions Weight Bearing Restrictions: No Other Position/Activity Restrictions: Edema and pain  with weight bearing for R knee and L wrist   Therapy/Group: Individual Therapy  [name] 03/29/2021, 7:25 AM

## 2021-03-29 NOTE — Progress Notes (Signed)
PROGRESS NOTE   Subjective/Complaints: Had a pretty good night. Denies pain. Asks when he can go home  ROS: Patient denies fever, rash, sore throat, blurred vision, nausea, vomiting, diarrhea, cough, shortness of breath or chest pain, joint or back pain, headache, or mood change.    Objective:   No results found. Recent Labs    03/28/21 0628  WBC 11.8*  HGB 16.7  HCT 50.3  PLT 563*    Recent Labs    03/28/21 0628  NA 136  K 5.2*  CL 100  CO2 27  GLUCOSE 116*  BUN 22  CREATININE 1.22  CALCIUM 9.1    Intake/Output Summary (Last 24 hours) at 03/29/2021 4854 Last data filed at 03/29/2021 0900 Gross per 24 hour  Intake 120 ml  Output 500 ml  Net -380 ml        Physical Exam: Vital Signs Blood pressure 122/82, pulse 73, temperature 98.4 F (36.9 C), temperature source Oral, resp. rate 18, height 6' (1.829 m), weight 95.7 kg, SpO2 99 %. Constitutional: No distress . Vital signs reviewed. HEENT: NCAT, EOMI, oral membranes moist Neck: supple Cardiovascular: RRR without murmur. No JVD    Respiratory/Chest: CTA Bilaterally without wheezes or rales. Normal effort    GI/Abdomen: BS +, non-tender, non-distended Ext: no clubbing, cyanosis, or edema Psych: pleasant and cooperative  Skin: scattered abrasions Musc: No edema in extremities.  No tenderness in extremities today. Full ROM Neuro: Alert and oriented, except for day of the month, stable Motor: RUE 5/5.  RLE: 4/5, stable LUE: 4-/5 proximal to distal.  LLE: 3+/5 HF, KE, 2-3/5 ADF  Assessment/Plan: 1. Functional deficits which require 3+ hours per day of interdisciplinary therapy in a comprehensive inpatient rehab setting. Physiatrist is providing close team supervision and 24 hour management of active medical problems listed below. Physiatrist and rehab team continue to assess barriers to discharge/monitor patient progress toward functional and medical  goals  Care Tool:  Bathing    Body parts bathed by patient: Right arm, Left arm, Chest, Buttocks, Right upper leg, Front perineal area, Abdomen, Left upper leg, Right lower leg, Left lower leg, Face   Body parts bathed by helper: Right arm, Buttocks, Right upper leg, Left upper leg, Right lower leg, Left lower leg     Bathing assist Assist Level: Supervision/Verbal cueing     Upper Body Dressing/Undressing Upper body dressing   What is the patient wearing?: Pull over shirt    Upper body assist Assist Level: Supervision/Verbal cueing    Lower Body Dressing/Undressing Lower body dressing      What is the patient wearing?: Pants, Incontinence brief     Lower body assist Assist for lower body dressing: Supervision/Verbal cueing     Toileting Toileting    Toileting assist Assist for toileting: Minimal Assistance - Patient > 75%     Transfers Chair/bed transfer  Transfers assist  Chair/bed transfer activity did not occur: Safety/medical concerns  Chair/bed transfer assist level: Supervision/Verbal cueing     Locomotion Ambulation   Ambulation assist   Ambulation activity did not occur: Safety/medical concerns  Assist level: Supervision/Verbal cueing Assistive device: Walker-rolling Max distance: 150   Walk 10 feet  activity   Assist  Walk 10 feet activity did not occur: Safety/medical concerns  Assist level: Supervision/Verbal cueing Assistive device: Walker-rolling   Walk 50 feet activity   Assist Walk 50 feet with 2 turns activity did not occur: Safety/medical concerns  Assist level: Supervision/Verbal cueing Assistive device: Walker-rolling    Walk 150 feet activity   Assist Walk 150 feet activity did not occur: Safety/medical concerns  Assist level: Supervision/Verbal cueing Assistive device: Walker-rolling    Walk 10 feet on uneven surface  activity   Assist Walk 10 feet on uneven surfaces activity did not occur: Safety/medical  concerns         Wheelchair     Assist     Wheelchair activity did not occur: Safety/medical concerns (L wrist edema and pain, awaiting x-ray results, NWB during eval)         Wheelchair 50 feet with 2 turns activity    Assist    Wheelchair 50 feet with 2 turns activity did not occur: Safety/medical concerns       Wheelchair 150 feet activity     Assist  Wheelchair 150 feet activity did not occur: Safety/medical concerns       Blood pressure 122/82, pulse 73, temperature 98.4 F (36.9 C), temperature source Oral, resp. rate 18, height 6' (1.829 m), weight 95.7 kg, SpO2 99 %.  Medical Problem List and Plan: 1.  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  Antithrombotics: -DVT/anticoagulation:  Pharmaceutical: Other (comment)--transitioned to Eliquis on 09/08             -antiplatelet therapy: N/A 3. Left shoulder/right knee pain/Pain Management: Local measures with Voltaren gel/ice/heat. Favors knee in weight bearing             --xrays demonstrates effusion seen on exam.   -acute gout right knee and left wrist--responsive to prednisone  -prednisone 10mg  completed on 9/16  4. Mood: LCSW to follow for evaluation and support.              -antipsychotic agents: N/A 5. Neuropsych: This patient is not  capable of making decisions on his own behalf. 6. Skin/Wound Care: Routine pressure relief measures.  7. Fluids/Electrolytes/Nutrition: encourage PO -protein supp for low albumin  Potassium 5.2--now off supplement  -f/u later in week 8. 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              --Monitor HR TID--continue Eliquis and Lopressor  - added norvasc with improvement  Controlled on 9/20 9. HTN: Monitor BP TID--on avapro, Lopressor   Increased norvasc to 10mg   Fairly Controlled on 9/20 10. RUE shaking movements: Felt to be postural tremor and Keppra d/c 09/06 11. Leucocytosis:    -blood cx staph epi (contaminate) on acute -UA negative and UC with insignificant growth WBCs 17.2 on 9/16-->11.8 9/19 Afebrile, no s/s infection   12. Hyperlipidemia: Now on Lipitor   13.  Dysphagia  D3 thins, advance as tolerated  LOS: 12 days A FACE TO FACE EVALUATION WAS PERFORMED  03-11-2004 03/29/2021, 9:23 AM

## 2021-03-29 NOTE — Progress Notes (Signed)
Physical Therapy Session Note  Patient Details  Name: Patrick Padilla MRN: [id number] Date of Birth: [birth year]  Today's Date: 03/29/2021 PT Individual Time: 1400-1455 PT Individual Time Calculation (min): 55 min   Short Term Goals: Week 2:  PT Short Term Goal 1 (Week 2): Patient will perform basic transfers with CGA consistently using LRAD. PT Short Term Goal 2 (Week 2): Patient will ambulate >200 feet with CGA demonstrating visual scanning strategies to improve L attention with min cues. PT Short Term Goal 3 (Week 2): Patient will perform dynamic standing balance >5 min with CGA without upper extremity support. PT Short Term Goal 4 (Week 2): Patient will improve score on Berg Balance Scale >4 points to meet MCID.  Skilled Therapeutic Interventions/Progress Updates:     Patient in bed asleep upon PT arrival. Patient easily aroused and agreeable to PT session. Patient denied pain during session.  Therapeutic Activity: Bed Mobility: Patient performed supine to/from sit with mod I for increased time. Provided verbal cues for using log roll technique to come to sitting to reduced back pain with mobility. Transfers: Patient performed sit to/from stand x5 with supervision for safety/balance without AD.   Gait Training:  Patient ambulated >200 feet x2 without AD with CGA and min A x2 due to L toe catching. Ambulated with decreased gait speed, decreased step length and height L>R, mild forward trunk lean, and improved L attention with min cues today. Provided verbal cues for increased step height and increased gait speed and arm swing for improved balance. Patient ascended/descended 8 steps using R rail with close supervision. Performed step-to gait pattern recalling cues from yesterday's session.   Neuromuscular Re-ed: Patient performed the following activities for improved functional balance and L attention: -"cone hunt" placed orange cones throughout L side of the gym and cues patient to find  cones on either side of the gym, patient located 10/10 cones without cues, but with increased time for scanning: time 4:16, performed bending to pick cones up, stepping around objects, and reaching overhead to reach cones with CGA-min A without AD -standing and sitting balance while dressing with close supervision for safety: placed clothing on patient's left to require L attention during dressing, patient required mod cues to locate his shirt, don shirt in sitting for safety, and to tie his L shoe, doffed paper scrubs and brief and donned underwear, shorts, shirt, socks, and shoes with supervision  Patient required seated rest breaks between activities due to decreased activity tolerance. Educated on benefits of smoking and alcohol cessation at d/c for improved recovery and stroke prevention. Education on lifestyle factors that contribute to stroke, and factors that increase patient's risk for stroke. Patient attentive and appropriate throughout education. Did comment that he would reduce the amount of smoking at drinking, but was unwilling to give either up at this time. Will continue to reinforce education with patient and family.   Patient in w/c in the room at end of session with breaks locked, seat belt alarm set, and all needs within reach.   Therapy Documentation Precautions:  Precautions Precautions: Fall Precaution Comments: impulsive Restrictions Weight Bearing Restrictions: No Other Position/Activity Restrictions: Edema and pain with weight bearing for R knee and L wrist    Therapy/Group: Individual Therapy  Darlette Dubow L Madiha Bambrick PT, DPT  03/29/2021, 4:54 PM

## 2021-03-29 NOTE — Patient Care Conference (Signed)
Inpatient RehabilitationTeam Conference and Plan of Care Update Date: 03/29/2021   Time: 10:01 AM    Patient Name: Patrick Padilla      Medical Record Number: [id number]  Date of Birth: [birth year] Sex: Male         Room/Bed: 4W09C/4W09C-01 Payor Info: Payor: MEDICARE / Plan: MEDICARE PART A AND B / Product Type: *No Product type* /    Admit Date/Time:  03/17/2021  5:09 PM  Primary Diagnosis:  Acute ischemic right MCA stroke [facility])  Hospital Problems: Principal Problem:   Acute ischemic right MCA stroke (HCC) Active Problems:   Leucocytosis   Benign essential HTN   Hypokalemia   Acute gout of right knee   Dysphagia    Expected Discharge Date: Expected Discharge Date: 04/02/21  Team Members Present: Physician leading conference: Dr. Faith Rogue Social Worker Present: Cecile Sheerer, LCSWA Nurse Present: Kennyth Arnold, RN PT Present: Serina Cowper, PT OT Present: Towanda Malkin, OT SLP Present: Feliberto Gottron, SLP PPS Coordinator present : Fae Pippin, SLP     Current Status/Progress Goal Weekly Team Focus  Bowel/Bladder   Pt is continent to bowlel and bladder LBM 9/17  pt to remain cont of B/B      Swallow/Nutrition/ Hydration   Dys. 3 textures with thin liquids, Intermittent supervision  Mod I  Trials of regular textures, use of swallow strateiges   ADL's   CS/set up for funcitonal transfers and adl tasks  Supervision  balance, conditioning, adl/cognition, functional transfers and mobility, patient/family education   Mobility   CGA-supervision with RW overall, Berg 42/56 9/19  Supervision overall  Balance, activity tolerance, L attention, safety awareness, d/c planning, patient caregiver education   Communication             Safety/Cognition/ Behavioral Observations  Mod A  Supervision-Min A  attention, awareness, problem solving and working memory   Pain   pain control with volteren gel and occasional tylenol  pt to be free of pain  assess pain q 4 hr and  prn   Skin   Skin is intact MASD to groin resolved  skin to reamin intact  assess skin q shift and prn     Discharge Planning:  D/c to home with intermitent support as son works days, and son's girlfriend can provide supervision during the day. family edu to be completed on 9/23 1pm-3pm.   Team Discussion: Making progress with pain management, improvement with cognition, working on BP control. Continent B/B, LBM 9/17, Tylenol for pain, Trazodone for sleep. Educating on stroke prophylaxis, pain management, and safety. Close supervision for all ADL's, fatigues quickly, muscle spasms. Supervision/contact guard community. SLP reports he has a medication management system at home but it doesn't work that well. Working on new strategies.   Patient on target to meet rehab goals: yes  *See Care Plan and progress notes for long and short-term goals.   Revisions to Treatment Plan:  MD adjusting BP medications.  Teaching Needs: Family education, medication management, pain management, transfer training, gait training, balance training, endurance training, safety awareness.  Current Barriers to Discharge: Decreased caregiver support, Medical stability, Home enviroment access/layout, Lack of/limited family support, and Medication compliance  Possible Resolutions to Barriers: Continue current medications for pain management, continue to work on endurance management for fatigue, continue family education.     Medical Summary Current Status: improved pain. sleeping well. hallucinations much improved. still some cognitive deficits/STM deficits  Barriers to Discharge: Medical stability   Possible Resolutions to [employer] Focus: continued  cognitive remediation, pain control, daily assessment of pt data and labs   Continued Need for Acute Rehabilitation Level of Care: The patient requires daily medical management by a physician with specialized training in physical medicine and rehabilitation for  the following reasons: Direction of a multidisciplinary physical rehabilitation program to maximize functional independence : Yes Medical management of patient stability for increased activity during participation in an intensive rehabilitation regime.: Yes Analysis of laboratory values and/or radiology reports with any subsequent need for medication adjustment and/or medical intervention. : Yes   I attest that I was present, lead the team conference, and concur with the assessment and plan of the team.   Tennis Must 03/29/2021, 12:03 PM

## 2021-03-30 NOTE — Progress Notes (Signed)
Occupational Therapy Session Note  Patient Details  Name: Patrick Padilla MRN: [id number] Date of Birth: February 14, 1956  Today's Date: 03/30/2021 OT Individual Time: 0702-0800 OT Individual Time Calculation (min): 58 min    Short Term Goals: Week 1:  OT Short Term Goal 1 (Week 1): Pt will sit EOB with Supervision dynamically for 5 min OT Short Term Goal 1 - Progress (Week 1): Met OT Short Term Goal 2 (Week 1): Pt will don shirt wiht MIN A OT Short Term Goal 2 - Progress (Week 1): Met OT Short Term Goal 3 (Week 1): Pt will thread 1LE into pants wiht A for sitting balance only OT Short Term Goal 3 - Progress (Week 1): Met OT Short Term Goal 4 (Week 1): Pt will locate grooming items at sink with no cuing OT Short Term Goal 4 - Progress (Week 1): Met Week 2:  OT Short Term Goal 1 (Week 2): Patient will complete funcitonal transfers with RW CS without cues OT Short Term Goal 2 (Week 2): patient will complete shower/bathing and dressing tasks with CS/set up OT Short Term Goal 3 (Week 2): patient will complete visual scanning tasks without cues or increased time to locate items on left  Skilled Therapeutic Interventions/Progress Updates:    Pt received asleep in bed with 2 out of 10 pain in back. Easily awoken and agreeable to OT session focusing on showering with close (S) in preparation for d/c this week. Repositioning provided for pain relief. Pt said it was mostly just "stiffness".   ADL: Pt completes BADL at overall CGA-close (S) level. Skilled interventions include: Pt completed bed mobility with close (S), needed cues to don non-slip stock on L foot prior to standing using RW. Pt demonstrated slight confusion with which foot to put sock on. Needed min VC to redirect to L foot. Pt slightly impulsive in standing before waiting for OTS. Completed functional mobility using RW to bathroom. Stood to void urine with close (S). MIN educational cuing provided for safe mgmt of RW at toilet. Needed CGA to  pivot to shower and sit on BSC. MOD cuing provided to redirect perseveration on turning shower on before getting undressed or sitting down. Completes shower with close (S) - pt needed MIN cuing for attention to task. OTS assisted with donning brief in standing. Pt demonstrated poor carry over in safety when pivoting to sit in wc without using RW. Pt educated on proper RW use, fall prevention, and finishing a task completely before beginning new one. Pt verbalized understanding. Required MIN educational cuing to orient pants to self. Shirt donned with setup from L side. Pt left at end of session in wc with alarm belt on, call light in reach, breakfast setup on tray and all needs met      Therapy/Group: Individual Therapy  Liyana Suniga 03/30/2021, 11:19 AM

## 2021-03-30 NOTE — Progress Notes (Addendum)
Speech Language Pathology Session Note  Patient Details  Name: Patrick Padilla MRN: [id number] Date of Birth: [birth year]  Today's Date: 03/30/2021 SLP Individual Time: 2376-2831 SLP Individual Time Calculation (min): 40 min  Short Term Goals: Week 2: SLP Short Term Goal 1 (Week 2): Patient will demonstrate efficient mastication and complete oral clearance with trials of regular textures with supervision level verbal cues over 2 sessions without overt s/s of aspiration prior to upgrade. SLP Short Term Goal 2 (Week 2): Patient will self-monitor and correct errors during functional tasks with Min verbal cues. SLP Short Term Goal 3 (Week 2): Patient will demonstrate functional problem solving for basic and familiar tasks with Min verbal cues. SLP Short Term Goal 4 (Week 2): Patient will demonstrate sustained attention to functional tasks for ~30 minutes with Min verbal cues for redirection. SLP Short Term Goal 5 (Week 2): Patient will demonstrate orientation to place, time and situation with Mod I for use of external aids.  Skilled Therapeutic Interventions: Skilled treatment session focused on dysphagia and cognitive goals. Upon arrival, patient was upright in the wheelchair and demonstrated language of confusion regarding results of events from yesterday. SLP utilized a calendar to assist in recall regarding current d/c date. SLP provided trials of regular textures. Patient demonstrated efficient mastication with mild oral residue that cleared with liquid washes. No overt s/s of aspiration noted despite intermittent talking with a  full oral cavity. Recommend patient upgrade to regular textures with intermittent supervision. Patient demonstrated alternating attention between conversation and self-feeding for 30 minutes with Min verbal cues for redirection. Patient left upright in wheelchair with alarm on and all needs within reach. Continue with current plan of care.      Pain No/Denies Pain     Therapy/Group: Individual Therapy  Belma Dyches 03/30/2021, 12:36 PM

## 2021-03-30 NOTE — Progress Notes (Signed)
Physical Therapy Session Note  Patient Details  Name: Patrick Padilla MRN: [id number] Date of Birth: 09-Jan-1956  Today's Date: 03/30/2021 PT Individual Time: 1412-1435 PT Individual Time Calculation (min): 23 min   Short Term Goals: Week 2:  PT Short Term Goal 1 (Week 2): Patient will perform basic transfers with CGA consistently using LRAD. PT Short Term Goal 2 (Week 2): Patient will ambulate >200 feet with CGA demonstrating visual scanning strategies to improve L attention with min cues. PT Short Term Goal 3 (Week 2): Patient will perform dynamic standing balance >5 min with CGA without upper extremity support. PT Short Term Goal 4 (Week 2): Patient will improve score on Berg Balance Scale >4 points to meet MCID.  Skilled Therapeutic Interventions/Progress Updates:     Patient in bed asleep upon PT arrival. Patient easily aroused and agreeable to PT session. Patient denied pain during session.  Therapeutic Activity: Bed Mobility: Patient performed supine to/from sit with supervision for cues for log roll technique for reduced back pain with mobility.  Transfers: Patient performed sit to/from stand x1 with supervision-mod I using RW.   Gait Training:  Patient ambulated >300 feet for gait and endurance training using RW with supervision. Ambulated with decreased gait speed, decreased step length and height L>R, forward trunk lean, and decreased visual scanning. Provided verbal cues for visual scanning L>R, increased L foot clearance for safety, and proximity to RW x1.  Educated on goals of family education, provided orientation due to decreased awareness of location and time, and discussed d/c planning and d/c recs throughout session.   Patient in w/c in the room at end of session with breaks locked, seat belt alarm set, and all needs within reach.   Therapy Documentation Precautions:  Precautions Precautions: Fall Precaution Comments: impulsive Restrictions Weight Bearing  Restrictions: No Other Position/Activity Restrictions: Edema and pain with weight bearing for R knee and L wrist    Therapy/Group: Individual Therapy  Patrick Padilla L Patrick Padilla PT, DPT  03/30/2021, 4:25 PM

## 2021-03-30 NOTE — Plan of Care (Signed)
  Problem: RH Problem Solving Goal: LTG Patient will demonstrate problem solving for (SLP) Description: LTG:  Patient will demonstrate problem solving for basic/complex daily situations with cues  (SLP) Flowsheets (Taken 03/30/2021 1244) LTG: Patient will demonstrate problem solving for (SLP): Basic daily situations Note: Goal downgraded due to fluctuating cognitive functioning

## 2021-03-30 NOTE — Progress Notes (Signed)
Patient ID: Patrick Padilla, male   DOB: 11/30/1955, 65 y.o.   MRN: [id number]  SW faxed [facility] referral (p:[phone]/f:[phone]).   Cecile Sheerer, MSW, LCSWA Office: [phone] Cell: [phone] Fax: [phone]

## 2021-03-31 DIAGNOSIS — I63511 Cerebral infarction due to unspecified occlusion or stenosis of right middle cerebral artery: Secondary | ICD-10-CM | POA: Diagnosis not present

## 2021-03-31 DIAGNOSIS — I6783 Posterior reversible encephalopathy syndrome: Secondary | ICD-10-CM | POA: Diagnosis not present

## 2021-03-31 DIAGNOSIS — M109 Gout, unspecified: Secondary | ICD-10-CM | POA: Diagnosis not present

## 2021-03-31 DIAGNOSIS — D72829 Elevated white blood cell count, unspecified: Secondary | ICD-10-CM | POA: Diagnosis not present

## 2021-03-31 DIAGNOSIS — I10 Essential (primary) hypertension: Secondary | ICD-10-CM | POA: Diagnosis not present

## 2021-03-31 DIAGNOSIS — M25461 Effusion, right knee: Secondary | ICD-10-CM | POA: Diagnosis not present

## 2021-03-31 NOTE — Progress Notes (Signed)
Physical Therapy Discharge Summary  Patient Details  Name: Patrick Padilla MRN: [id number] Date of Birth: 1955-12-01  Today's Date: 04/01/2021   Patient has met 9 of 9 long term goals due to improved activity tolerance, improved balance, improved postural control, increased strength, increased range of motion, decreased pain, ability to compensate for deficits, improved attention, improved awareness, and improved coordination.  Patient to discharge at an ambulatory level Supervision.   Patient's care partner is independent to provide the necessary cognitive assistance at discharge.  Reasons goals not met: n/a  Recommendation:  Patient will benefit from ongoing skilled PT services in outpatient setting to continue to advance safe functional mobility, address ongoing impairments in balance, L side strength, and minimize fall risk.  Equipment: Recommending RW, patient reports that he has a RW at home  Reasons for discharge: treatment goals met  Patient/family agrees with progress made and goals achieved: Yes  PT Discharge Precautions/Restrictions Precautions Precautions: Fall Precaution Comments: impulsive Restrictions Weight Bearing Restrictions: No Pain Interference Pain Interference Pain Effect on Sleep: 1. Rarely or not at all Pain Interference with Therapy Activities: 1. Rarely or not at all Pain Interference with Day-to-Day Activities: 1. Rarely or not at all Vision/Perception  Vision - History Ability to See in Adequate Light: 0 Adequate (with glasses on) Vision - Assessment Eye Alignment: Impaired (comment) (R exotropia) Ocular Range of Motion: Within Functional Limits Alignment/Gaze Preference: Within Defined Limits Tracking/Visual Pursuits: Able to track stimulus in all quads without difficulty Saccades: Undershoots Convergence: Within functional limits Perception Perception: Impaired Inattention/Neglect: Does not attend to left visual field Praxis Praxis: Intact   Cognition Overall Cognitive Status: Impaired/Different from baseline Arousal/Alertness: Awake/alert Orientation Level: Oriented X4 Attention: Sustained;Selective Focused Attention: Appears intact Sustained Attention: Appears intact Selective Attention: Impaired Selective Attention Impairment: Functional basic Memory: Impaired Memory Impairment: Storage deficit;Decreased recall of new information;Decreased short term memory Awareness: Impaired Awareness Impairment: Emergent impairment Problem Solving: Impaired Problem Solving Impairment: Functional basic Self Monitoring: Impaired Behaviors: Impulsive Safety/Judgment: Impaired Sensation Sensation Light Touch: Appears Intact Proprioception: Appears Intact Coordination Gross Motor Movements are Fluid and Coordinated: No Finger Nose Finger Test: slow and deliberate L>R Heel Shin Test: slow and deliberate Motor  Motor Motor: Hemiplegia Motor - Skilled Clinical Observations: mild L hemi  Mobility Bed Mobility Bed Mobility: Rolling Right;Rolling Left;Supine to Sit;Sit to Supine Rolling Right: Independent Rolling Left: Independent Supine to Sit: Supervision/Verbal cueing Sit to Supine: Supervision/Verbal cueing Transfers Transfers: Sit to Stand;Stand to Sit;Stand Pivot Transfers Sit to Stand: Supervision/Verbal cueing;Independent with assistive device Stand to Sit: Supervision/Verbal cueing;Independent with assistive device Stand Pivot Transfers: Supervision/Verbal cueing;Independent with assistive device Transfer (Assistive device): Rolling walker Locomotion  Gait Ambulation: Yes Gait Assistance: Supervision/Verbal cueing Assistive device: Rolling walker Gait Gait: Yes Gait Pattern: Impaired Gait Pattern: Step-through pattern;Decreased stride length;Decreased hip/knee flexion - left;Decreased weight shift to left;Trunk flexed;Decreased trunk rotation;Narrow base of support Stairs / Additional Locomotion Stairs:  Yes Stairs Assistance: Contact Guard/Touching assist Stair Management Technique: One rail Right;One rail Left Number of Stairs: 12 Height of Stairs: 6 Ramp: Supervision/Verbal cueing (using RW) Curb: Supervision/Verbal cueing (using RW) Wheelchair Mobility Wheelchair Mobility: No  Trunk/Postural Assessment  Cervical Assessment Cervical Assessment: Exceptions to [facility] (forward head) Thoracic Assessment Thoracic Assessment: Exceptions to [facility] (rounded shoulders) Lumbar Assessment Lumbar Assessment: Exceptions to [facility] (posterior pelvic tilt) Postural Control Postural Control: Deficits on evaluation (righting reaction)  Balance Standardized Balance Assessment Standardized Balance Assessment: Berg Balance Test Berg Balance Test Sit to Stand: Able to stand without using hands and  stabilize independently Standing Unsupported: Able to stand safely 2 minutes Sitting with Back Unsupported but Feet Supported on Floor or Stool: Able to sit safely and securely 2 minutes Stand to Sit: Sits safely with minimal use of hands Transfers: Able to transfer safely, minor use of hands Standing Unsupported with Eyes Closed: Able to stand 10 seconds safely Standing Ubsupported with Feet Together: Able to place feet together independently and stand 1 minute safely From Standing, Reach Forward with Outstretched Arm: Can reach confidently >25 cm (10") From Standing Position, Pick up Object from Floor: Able to pick up shoe safely and easily From Standing Position, Turn to Look Behind Over each Shoulder: Looks behind from both sides and weight shifts well Turn 360 Degrees: Able to turn 360 degrees safely in 4 seconds or less Standing Unsupported, Alternately Place Feet on Step/Stool: Able to stand independently and safely and complete 8 steps in 20 seconds Standing Unsupported, One Foot in Front: Able to plae foot ahead of the other independently and hold 30 seconds Standing on One Leg: Tries to lift leg/unable to  hold 3 seconds but remains standing independently Total Score: 52 Static Sitting Balance Static Sitting - Level of Assistance: 7: Independent Dynamic Sitting Balance Dynamic Sitting - Level of Assistance: 7: Independent Static Standing Balance Static Standing - Level of Assistance: 7: Independent Dynamic Standing Balance Dynamic Standing - Level of Assistance: 5: Stand by assistance Extremity Assessment  RLE Assessment RLE Assessment: Within Functional Limits Active Range of Motion (AROM) Comments: knee with full ROM and pain free for 2-3 days General Strength Comments: Grossly 5/5 in sitting LLE Assessment LLE Assessment: Exceptions to [facility] General Strength Comments: Grossly 5/5 throughout, except hip flexion 4/5 in sitting    Henryetta Corriveau L Dequarius Jeffries PT, DPT  04/01/2021, 4:22 PM

## 2021-03-31 NOTE — Progress Notes (Signed)
Patient ID: Patrick Padilla, male   DOB: 1955/09/14, 65 y.o.   MRN: [id number]  SW left message for pt son Onalee Hua [phone]) to confirm if they have all DME. SW waiting on follow-up.  Cecile Sheerer, MSW, LCSWA Office: [phone] Cell: [phone] Fax: [phone]

## 2021-03-31 NOTE — Progress Notes (Signed)
Occupational Therapy Session Note  Patient Details  Name: Patrick Padilla MRN: [id number] Date of Birth: 05-Jul-1956  Today's Date: 03/31/2021 OT Individual Time: 1445-1515 OT Individual Time Calculation (min): 30 min    Short Term Goals: Week 1:  OT Short Term Goal 1 (Week 1): Pt will sit EOB with Supervision dynamically for 5 min OT Short Term Goal 1 - Progress (Week 1): Met OT Short Term Goal 2 (Week 1): Pt will don shirt wiht MIN A OT Short Term Goal 2 - Progress (Week 1): Met OT Short Term Goal 3 (Week 1): Pt will thread 1LE into pants wiht A for sitting balance only OT Short Term Goal 3 - Progress (Week 1): Met OT Short Term Goal 4 (Week 1): Pt will locate grooming items at sink with no cuing OT Short Term Goal 4 - Progress (Week 1): Met Week 2:  OT Short Term Goal 1 (Week 2): Patient will complete funcitonal transfers with RW CS without cues OT Short Term Goal 2 (Week 2): patient will complete shower/bathing and dressing tasks with CS/set up OT Short Term Goal 3 (Week 2): patient will complete visual scanning tasks without cues or increased time to locate items on left  Skilled Therapeutic Interventions/Progress Updates:    Pt received in hallway with PT. No c/o pain. Functional mobility completed to and from all therapeutic destinations with RW and close (S). Pt will be d/c home with TTB - session consisted of practice in/out of tub using RW. Education provided on strategies to manage shower curtain at home for safety. Pt verbalized understanding. Pt internally and externally distracted at times - required redirection to task frequently. MOD cuing for reminders to keep RW close and OTS facilitated repeated practice to stand > sit on TTB with RW in front and not at side. Pt was CGA for first time, and close (S) for remainder of trials. Switched to TTB in rehab apt to simulate pt's home setup. Pt stand > sit in recliner with close (S). BP assessed seated in recliner 90/60. Completed 3 trials  of using RW to mobilize to bathroom, and swing legs on TTB with close (S). Pt able to recall room number with one VC. Pt left at end of session in bed with exit alarm on, call light in reach and all needs met.    Therapy Documentation Precautions:  Precautions Precautions: Fall Precaution Comments: impulsive Restrictions Weight Bearing Restrictions: No Other Position/Activity Restrictions: Edema and pain with weight bearing for R knee and L wrist General:   Vital Signs: Therapy Vitals Temp: 97.6 F (36.4 C) Temp Source: Oral Pulse Rate: 76 Resp: 18 BP: 107/68 Patient Position (if appropriate): Standing Oxygen Therapy SpO2: 97 % O2 Device: Room Air Pain:   ADL: ADL Grooming: Moderate assistance Where Assessed-Grooming: Edge of bed Upper Body Bathing: Moderate assistance Where Assessed-Upper Body Bathing: Edge of bed Lower Body Bathing: Maximal assistance Where Assessed-Lower Body Bathing: Edge of bed Upper Body Dressing: Moderate assistance Where Assessed-Upper Body Dressing: Edge of bed Lower Body Dressing: Maximal assistance Where Assessed-Lower Body Dressing: Edge of bed Vision Baseline Vision/History: 1 Wears glasses Patient Visual Report: No change from baseline Vision Assessment?: Yes Eye Alignment: Impaired (comment) (R exotropia) Ocular Range of Motion: Within Functional Limits Alignment/Gaze Preference: Within Defined Limits Tracking/Visual Pursuits: Able to track stimulus in all quads without difficulty Saccades: Undershoots Convergence: Within functional limits Visual Fields: No apparent deficits Perception  Perception: Impaired Inattention/Neglect: Does not attend to left visual field Praxis Praxis: Intact Exercises:  Other Treatments:     Therapy/Group: Individual Therapy  Keith Cancio 03/31/2021, 4:36 PM

## 2021-03-31 NOTE — Progress Notes (Signed)
Physical Therapy Session Note  Patient Details  Name: Patrick Padilla MRN: [id number] Date of Birth: 30-Dec-1955  Today's Date: 03/31/2021 PT Individual Time: 1010-1037 PT Individual Time Calculation (min): 27 min   Short Term Goals: Week 2:  PT Short Term Goal 1 (Week 2): Patient will perform basic transfers with CGA consistently using LRAD. PT Short Term Goal 2 (Week 2): Patient will ambulate >200 feet with CGA demonstrating visual scanning strategies to improve L attention with min cues. PT Short Term Goal 3 (Week 2): Patient will perform dynamic standing balance >5 min with CGA without upper extremity support. PT Short Term Goal 4 (Week 2): Patient will improve score on Berg Balance Scale >4 points to meet MCID.  Skilled Therapeutic Interventions/Progress Updates:    Pt received supine asleep, in bed and upon awakening agreeable to therapy session. Supine>sitting L EOB, HOB flat, with supervision. Requires some increased time to awaken fully. Sitting EOB donned tennis shoes set-up assist. Sit<>stands using RW with close supervision during session. Gait training ~157ft to main therapy gym using RW with light CGA for safety - allowed pt attempt to navigate himself towards therapy gym with pt turning R out of his room and not recognizing mistake until reaching door at end of hallway - then again requires cuing to visually scan L to turn towards therapy gym at next turn.   Stair navigation 4steps x4 using B UE support as needed with cuing for reciprocal pattern for increased dynamic balance challenge with CGA for safety.  Dynamic gait training ~172ft using RW back to room with pt requiring max cuing to locate signs in hallway to direct himself back towards his room. At end of session pt left supine in bed with needs in reach and bed alarm on.  Therapy Documentation Precautions:  Precautions Precautions: Fall Precaution Comments: impulsive Restrictions Weight Bearing Restrictions: No Other  Position/Activity Restrictions: Edema and pain with weight bearing for R knee and L wrist   Pain:  No reports of pain throughout session.   Therapy/Group: Individual Therapy  Ginny Forth , PT, DPT, NCS, CSRS  03/31/2021, 7:54 AM

## 2021-03-31 NOTE — Progress Notes (Signed)
Physical Therapy Session Note  Patient Details  Name: Patrick Padilla MRN: [id number] Date of Birth: 1955-11-09  Today's Date: 03/31/2021 PT Individual Time: 1345-1445 PT Individual Time Calculation (min): 60 min   Short Term Goals: Week 2:  PT Short Term Goal 1 (Week 2): Patient will perform basic transfers with CGA consistently using LRAD. PT Short Term Goal 2 (Week 2): Patient will ambulate >200 feet with CGA demonstrating visual scanning strategies to improve L attention with min cues. PT Short Term Goal 3 (Week 2): Patient will perform dynamic standing balance >5 min with CGA without upper extremity support. PT Short Term Goal 4 (Week 2): Patient will improve score on Berg Balance Scale >4 points to meet MCID.  Skilled Therapeutic Interventions/Progress Updates:     Patient in bed upon PT arrival. Patient alert and agreeable to PT session. Patient reported 3/10 back pain during session, RN made aware. PT provided repositioning, rest breaks, and distraction as pain interventions throughout session. Reports he sleeps in a recliner at home and the bed is causing increased back pain.  Patient also reports he has not had R knee pain in 2-3 days.  RN reports patient's BP low just prior to session. Orthostatic Vitals: Supine: BP 103/71, HR 59  Sitting: BP 96/75, HR 72 Standing: BP 88/60, HR 86 Standing x3 min: BP 107/68, HR 76 Patient asymptomatic throughout assessment and session. Encouraged patient to drink fluids and patient consumed 1 cup of water during session.   Therapeutic Activity: Bed Mobility: Patient performed supine to sit with supervision for cues to use log roll technique to minimized back pain with minimal carry-over from previous sessions.  Transfers: Patient performed sit to/from stand x4 with supervision for cues for safe use of RW.   Gait Training:  Patient ambulated >100 feet and >200 feet using RW with supervision, required PT to hold pants up intermittently due to  them being too large. Ambulated with decreased gait speed, decreased step length and height L>R, forward trunk lean, and decreased visual scanning. Provided verbal cues for visual scanning L>R, increased L foot clearance for safety, and proximity to RW x1Had patient attempt to locate his room on return, patient made 3 passes past his room on the R and L, attempted to problem solve using room numbers without cues from therapist. Tasked terminated as OT arrived for initiation of next therapy session. Plan to re-attempt finding room at end of OT session.   Initiated d/c assessment with patient, as family education is scheduled for tomorrow, see details below.   Patient handed off to OT and OT student in the hallway at end of session.   Therapy Documentation Precautions:  Precautions Precautions: Fall Precaution Comments: impulsive Restrictions Weight Bearing Restrictions: No Pain Interference Pain Interference Pain Effect on Sleep: 1. Rarely or not at all Pain Interference with Therapy Activities: 1. Rarely or not at all Pain Interference with Day-to-Day Activities: 1. Rarely or not at all Vision/Perception  Vision - History Ability to See in Adequate Light: 0 Adequate (with glasses on) Vision - Assessment Eye Alignment: Impaired (comment) (R exotropia) Ocular Range of Motion: Within Functional Limits Alignment/Gaze Preference: Within Defined Limits Tracking/Visual Pursuits: Able to track stimulus in all quads without difficulty Saccades: Undershoots Convergence: Within functional limits Perception Perception: Impaired Inattention/Neglect: Does not attend to left visual field Praxis Praxis: Intact  Sensation Sensation Light Touch: Appears Intact Proprioception: Appears Intact Coordination Gross Motor Movements are Fluid and Coordinated: No Finger Nose Finger Test: slow and deliberate L>R Heel  Shin Test: slow and deliberate Motor  Motor Motor: Hemiplegia Motor - Skilled  Clinical Observations: mild L hemi  Extremity Assessment  RLE Assessment RLE Assessment: Within Functional Limits Active Range of Motion (AROM) Comments: knee with full ROM and pain free for 2-3 days General Strength Comments: Grossly 5/5 in sitting LLE Assessment LLE Assessment: Exceptions to [facility] General Strength Comments: Grossly 5/5 throughout, except hip flexion 4/5 in sitting   Therapy/Group: Individual Therapy  Patrick Padilla L Patrick Padilla PT, DPT  03/31/2021, 4:23 PM

## 2021-03-31 NOTE — Progress Notes (Signed)
Occupational Therapy Session Note  Patient Details  Name: Patrick Padilla MRN: [id number] Date of Birth: [birth year]  Today's Date: 03/31/2021 OT Individual Time: 6237-6283 OT Individual Time Calculation (min): 62 min    Short Term Goals: Week 1:  OT Short Term Goal 1 (Week 1): Pt will sit EOB with Supervision dynamically for 5 min OT Short Term Goal 1 - Progress (Week 1): Met OT Short Term Goal 2 (Week 1): Pt will don shirt wiht MIN A OT Short Term Goal 2 - Progress (Week 1): Met OT Short Term Goal 3 (Week 1): Pt will thread 1LE into pants wiht A for sitting balance only OT Short Term Goal 3 - Progress (Week 1): Met OT Short Term Goal 4 (Week 1): Pt will locate grooming items at sink with no cuing OT Short Term Goal 4 - Progress (Week 1): Met Week 2:  OT Short Term Goal 1 (Week 2): Patient will complete funcitonal transfers with RW CS without cues OT Short Term Goal 2 (Week 2): patient will complete shower/bathing and dressing tasks with CS/set up OT Short Term Goal 3 (Week 2): patient will complete visual scanning tasks without cues or increased time to locate items on left  Skilled Therapeutic Interventions/Progress Updates:    Pt received in bed with 3 or 4 out of 10 pain in L shoulder. Repositioning provided for pain relief and skilled education on alternative sleep positioning was also provided.   ADL: Pt completes BADL at overall (S) level. Skilled self-care interventions include: Pt completes self-care activities including showering, toilet ing,oral hygiene, UB/LB dressing with close (S). Pt stood to complete peri-care in shower and demonstrated safety awareness by keeping one hand on grab bar, and alternating to reach posteriorly. MIN directional cuing required occasionally for attention to task d/t external distractions or safety precautions. Educational cuing provided on management of RW when turning to sit back in wc. Pt required skilled subtle cuing to correct placing hands on RW  instead of wc to perform sit > stand for pulling pants over hips. Pt demonstrated carryover of learning with donning pants using strategy taught in previous sessions. MD stopped in during tx and pt reported shoulder pain/soreness. Pt needed reminders on anticipated d/c date. Mentioned several times that he needed to call son right away. Plans for d/c discussed - pt reports he has a tub/shower combo at home. Next session to focus using tub transfer bench to simulate home environment. Pt left at end of session in bed with exit alarm on, call light in reach and all needs met. Declined heat pack or cold pack at this time.    Therapy Documentation Precautions:  Precautions Precautions: Fall Precaution Comments: impulsive Restrictions Weight Bearing Restrictions: No Other Position/Activity Restrictions: Edema and pain with weight bearing for R knee and L wrist    Therapy/Group: Individual Therapy  Zeyad Delaguila 03/31/2021, 6:59 AM

## 2021-03-31 NOTE — Progress Notes (Signed)
Speech Language Pathology Daily Session Note  Patient Details  Name: Tanvir Hipple MRN: [id number] Date of Birth: 23-Jan-1956  Today's Date: 03/31/2021 SLP Individual Time: 0720-0805 SLP Individual Time Calculation (min): 45 min  Short Term Goals: Week 2: SLP Short Term Goal 1 (Week 2): Patient will demonstrate efficient mastication and complete oral clearance with trials of regular textures with supervision level verbal cues over 2 sessions without overt s/s of aspiration prior to upgrade. SLP Short Term Goal 2 (Week 2): Patient will self-monitor and correct errors during functional tasks with Min verbal cues. SLP Short Term Goal 3 (Week 2): Patient will demonstrate functional problem solving for basic and familiar tasks with Min verbal cues. SLP Short Term Goal 4 (Week 2): Patient will demonstrate sustained attention to functional tasks for ~30 minutes with Min verbal cues for redirection. SLP Short Term Goal 5 (Week 2): Patient will demonstrate orientation to place, time and situation with Mod I for use of external aids.  Skilled Therapeutic Interventions: Skilled treatment session focused on dysphagia and cognitive goals. SLP facilitated session by providing skilled observation with breakfast meal of regular textures with thin liquids. Patient consumed meal without overt s/s of aspiration but required Min verbal cues for problem solving and visual scanning during tray set-up. Patient continues to report he believes he is going home today. SLP provided education and visual aids to reinforce discharge date of 9/24. Patient also with difficulty using his personal cell phone to call his son. SLP provided total A to navigate phone to make a phone call. Patient left upright in bed with alarm on and all needs within reach. Continue with current plan of care.      Pain No/Denies Pain   Therapy/Group: Individual Therapy  Zoriana Oats 03/31/2021, 8:06 AM

## 2021-03-31 NOTE — Progress Notes (Signed)
PROGRESS NOTE   Subjective/Complaints: Pt up at sink washing up. Anxious to get home. Left shoulder a little sore still  ROS: Patient denies fever, rash, sore throat, blurred vision, nausea, vomiting, diarrhea, cough, shortness of breath or chest pain,  headache, or mood change.   Objective:   No results found. No results for input(s): WBC, HGB, HCT, PLT in the last 72 hours.   No results for input(s): NA, K, CL, CO2, GLUCOSE, BUN, CREATININE, CALCIUM in the last 72 hours.   Intake/Output Summary (Last 24 hours) at 03/31/2021 1028 Last data filed at 03/31/2021 0826 Gross per 24 hour  Intake 360 ml  Output 500 ml  Net -140 ml        Physical Exam: Vital Signs Blood pressure 113/69, pulse 70, temperature 98 F (36.7 C), resp. rate 18, height 6' (1.829 m), weight 95.7 kg, SpO2 99 %. Constitutional: No distress . Vital signs reviewed. HEENT: NCAT, EOMI, oral membranes moist Neck: supple Cardiovascular: RRR without murmur. No JVD    Respiratory/Chest: CTA Bilaterally without wheezes or rales. Normal effort    GI/Abdomen: BS +, non-tender, non-distended Ext: no clubbing, cyanosis, or edema Psych: pleasant and cooperative  Skin: scattered abrasions Musc: No edema in extremities. Left shoulder a little sore with PROM but no specific findings on exam. Neuro: Alert and oriented, recalled today's date and dc date but had a hard time processing that it was 2 days from now. Motor: RUE 5/5.  RLE: 4/5, stable LUE: 4-/5 proximal to distal.  LLE: 4/5 HF, KE,  3/5 ADF  Assessment/Plan: 1. Functional deficits which require 3+ hours per day of interdisciplinary therapy in a comprehensive inpatient rehab setting. Physiatrist is providing close team supervision and 24 hour management of active medical problems listed below. Physiatrist and rehab team continue to assess barriers to discharge/monitor patient progress toward functional  and medical goals  Care Tool:  Bathing    Body parts bathed by patient: Right arm, Left arm, Chest, Abdomen, Front perineal area, Buttocks, Right upper leg, Left upper leg, Right lower leg, Face, Left lower leg   Body parts bathed by helper: Right arm, Buttocks, Right upper leg, Left upper leg, Right lower leg, Left lower leg     Bathing assist Assist Level: Supervision/Verbal cueing     Upper Body Dressing/Undressing Upper body dressing   What is the patient wearing?: Pull over shirt    Upper body assist Assist Level: Supervision/Verbal cueing    Lower Body Dressing/Undressing Lower body dressing      What is the patient wearing?: Pants, Incontinence brief     Lower body assist Assist for lower body dressing: Supervision/Verbal cueing     Toileting Toileting    Toileting assist Assist for toileting: Contact Guard/Touching assist     Transfers Chair/bed transfer  Transfers assist  Chair/bed transfer activity did not occur: Safety/medical concerns  Chair/bed transfer assist level: Supervision/Verbal cueing     Locomotion Ambulation   Ambulation assist   Ambulation activity did not occur: Safety/medical concerns  Assist level: Supervision/Verbal cueing Assistive device: Walker-rolling Max distance: 150   Walk 10 feet activity   Assist  Walk 10 feet activity did not  occur: Safety/medical concerns  Assist level: Supervision/Verbal cueing Assistive device: Walker-rolling   Walk 50 feet activity   Assist Walk 50 feet with 2 turns activity did not occur: Safety/medical concerns  Assist level: Supervision/Verbal cueing Assistive device: Walker-rolling    Walk 150 feet activity   Assist Walk 150 feet activity did not occur: Safety/medical concerns  Assist level: Supervision/Verbal cueing Assistive device: Walker-rolling    Walk 10 feet on uneven surface  activity   Assist Walk 10 feet on uneven surfaces activity did not occur:  Safety/medical concerns         Wheelchair     Assist     Wheelchair activity did not occur: Safety/medical concerns (L wrist edema and pain, awaiting x-ray results, NWB during eval)         Wheelchair 50 feet with 2 turns activity    Assist    Wheelchair 50 feet with 2 turns activity did not occur: Safety/medical concerns       Wheelchair 150 feet activity     Assist  Wheelchair 150 feet activity did not occur: Safety/medical concerns       Blood pressure 113/69, pulse 70, temperature 98 F (36.7 C), resp. rate 18, height 6' (1.829 m), weight 95.7 kg, SpO2 99 %.  Medical Problem List and Plan: 1.  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  Antithrombotics: -DVT/anticoagulation:  Pharmaceutical: Other (comment)--transitioned to Eliquis on 09/08             -antiplatelet therapy: N/A 3. Left shoulder/right knee pain/Pain Management: Local measures with Voltaren gel/ice/heat. Favors knee in weight bearing             --xrays demonstrates effusion seen on exam.   -acute gout right knee and left wrist--responsive to prednisone  -prednisone 10mg  completed on 9/16  4. Mood: LCSW to follow for evaluation and support.              -antipsychotic agents: N/A 5. Neuropsych: This patient is not  capable of making decisions on his own behalf. 6. Skin/Wound Care: Routine pressure relief measures.  7. Fluids/Electrolytes/Nutrition: encourage PO -protein supp for low albumin  Potassium 5.2--now off supplement  -f/u 9/23 8. 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              --Monitor HR TID--continue Eliquis and Lopressor  - added norvasc with improvement  Controlled on 9/22 9. HTN: Monitor BP TID--on avapro, Lopressor   Increased norvasc to 10mg     Controlled on 9/22 10. RUE shaking movements: Felt to be postural tremor and Keppra d/c 09/06 11. Leucocytosis:   Resolved. Likely steroid  effect   12. Hyperlipidemia: Now on Lipitor   13.  Dysphagia  Advanced to regular diet by SLP  LOS: 14 days A FACE TO FACE EVALUATION WAS PERFORMED  10/22 03/31/2021, 10:28 AM

## 2021-04-01 DIAGNOSIS — R7989 Other specified abnormal findings of blood chemistry: Secondary | ICD-10-CM

## 2021-04-01 DIAGNOSIS — I10 Essential (primary) hypertension: Secondary | ICD-10-CM | POA: Diagnosis not present

## 2021-04-01 DIAGNOSIS — I63511 Cerebral infarction due to unspecified occlusion or stenosis of right middle cerebral artery: Secondary | ICD-10-CM | POA: Diagnosis not present

## 2021-04-01 DIAGNOSIS — M109 Gout, unspecified: Secondary | ICD-10-CM | POA: Diagnosis not present

## 2021-04-01 DIAGNOSIS — I6783 Posterior reversible encephalopathy syndrome: Secondary | ICD-10-CM | POA: Diagnosis not present

## 2021-04-01 LAB — BASIC METABOLIC PANEL
Anion gap: 8 (ref 5–15)
BUN: 24 mg/dL — ABNORMAL HIGH (ref 8–23)
CO2: 25 mmol/L (ref 22–32)
Calcium: 9.1 mg/dL (ref 8.9–10.3)
Chloride: 104 mmol/L (ref 98–111)
Creatinine, Ser: 1.15 mg/dL (ref 0.61–1.24)
GFR, Estimated: >60 mL/min (ref >60)
Glucose, Bld: 120 mg/dL — ABNORMAL HIGH (ref 70–99)
Potassium: 4.9 mmol/L (ref 3.5–5.1)
Sodium: 137 mmol/L (ref 135–145)

## 2021-04-01 MED ORDER — POLYETHYLENE GLYCOL 3350 17 G PO PACK
17.0000 g | PACK | Freq: Two times a day (BID) | ORAL | Status: DC
Start: 2021-04-01 — End: 2021-04-02
  Administered 2021-04-01 – 2021-04-02 (×2): 17 g via ORAL
  Filled 2021-04-01 (×2): qty 1

## 2021-04-01 NOTE — Progress Notes (Signed)
PROGRESS NOTE   Subjective/Complaints: Pt anxious to get home. No new complaints.   ROS: Patient denies fever, rash, sore throat, blurred vision, nausea, vomiting, diarrhea, cough, shortness of breath or chest pain,  headache, or mood change.   Objective:   No results found. No results for input(s): WBC, HGB, HCT, PLT in the last 72 hours.   Recent Labs    04/01/21 0553  NA 137  K 4.9  CL 104  CO2 25  GLUCOSE 120*  BUN 24*  CREATININE 1.15  CALCIUM 9.1     Intake/Output Summary (Last 24 hours) at 04/01/2021 1119 Last data filed at 04/01/2021 0717 Gross per 24 hour  Intake 960 ml  Output 2250 ml  Net -1290 ml        Physical Exam: Vital Signs Blood pressure 111/80, pulse 72, temperature 97.8 F (36.6 C), temperature source Oral, resp. rate 18, height 6' (1.829 m), weight 98.7 kg, SpO2 97 %. Constitutional: No distress . Vital signs reviewed. HEENT: NCAT, EOMI, oral membranes moist Neck: supple Cardiovascular: RRR without murmur. No JVD    Respiratory/Chest: CTA Bilaterally without wheezes or rales. Normal effort    GI/Abdomen: BS +, non-tender, non-distended Ext: no clubbing, cyanosis, or edema Psych: generally pleasant and cooperative  Skin: scattered abrasions Musc: No edema in extremities. Left shoulder remains a little sore with PROM but no specific findings on exam. Neuro: Alert and oriented to person, place, date. Still lacks awareness. Motor: RUE 5/5.  RLE: 4/5, stable LUE: 4-/5 proximal to distal.  LLE: 4/5 HF, KE,  3/5 ADF  Assessment/Plan: 1. Functional deficits which require 3+ hours per day of interdisciplinary therapy in a comprehensive inpatient rehab setting. Physiatrist is providing close team supervision and 24 hour management of active medical problems listed below. Physiatrist and rehab team continue to assess barriers to discharge/monitor patient progress toward functional and medical  goals  Care Tool:  Bathing    Body parts bathed by patient: Right arm, Left arm, Chest, Abdomen, Front perineal area, Buttocks, Right upper leg, Left upper leg, Right lower leg, Face, Left lower leg   Body parts bathed by helper: Right arm, Buttocks, Right upper leg, Left upper leg, Right lower leg, Left lower leg     Bathing assist Assist Level: Supervision/Verbal cueing     Upper Body Dressing/Undressing Upper body dressing   What is the patient wearing?: Pull over shirt    Upper body assist Assist Level: Supervision/Verbal cueing    Lower Body Dressing/Undressing Lower body dressing      What is the patient wearing?: Pants, Incontinence brief     Lower body assist Assist for lower body dressing: Supervision/Verbal cueing     Toileting Toileting    Toileting assist Assist for toileting: Supervision/Verbal cueing     Transfers Chair/bed transfer  Transfers assist  Chair/bed transfer activity did not occur: Safety/medical concerns  Chair/bed transfer assist level: Supervision/Verbal cueing Chair/bed transfer assistive device: Geologist, engineering   Ambulation assist   Ambulation activity did not occur: Safety/medical concerns  Assist level: Supervision/Verbal cueing Assistive device: Walker-rolling Max distance: 150   Walk 10 feet activity   Assist  Walk 10  feet activity did not occur: Safety/medical concerns  Assist level: Supervision/Verbal cueing Assistive device: Walker-rolling   Walk 50 feet activity   Assist Walk 50 feet with 2 turns activity did not occur: Safety/medical concerns  Assist level: Supervision/Verbal cueing Assistive device: Walker-rolling    Walk 150 feet activity   Assist Walk 150 feet activity did not occur: Safety/medical concerns  Assist level: Supervision/Verbal cueing Assistive device: Walker-rolling    Walk 10 feet on uneven surface  activity   Assist Walk 10 feet on uneven surfaces activity  did not occur: Safety/medical Engineer, technical sales activity did not occur: Safety/medical concerns (L wrist edema and pain, awaiting x-ray results, NWB during eval)         Wheelchair 50 feet with 2 turns activity    Assist    Wheelchair 50 feet with 2 turns activity did not occur: Safety/medical concerns       Wheelchair 150 feet activity     Assist  Wheelchair 150 feet activity did not occur: Safety/medical concerns       Blood pressure 111/80, pulse 72, temperature 97.8 F (36.6 C), temperature source Oral, resp. rate 18, height 6' (1.829 m), weight 98.7 kg, SpO2 97 %.  Medical Problem List and Plan: 1.  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  Antithrombotics: -DVT/anticoagulation:   Eliquis   09/08             3. Left shoulder/right knee pain/Pain Management: Local measures with Voltaren gel/ice/heat.              -acute gout right knee and left wrist--responsive to prednisone  -prednisone 10mg  completed on 9/16  4. Mood:  paxil             -antipsychotic agents: N/A 5. Neuropsych: This patient is not  capable of making decisions on his own behalf. 6. Skin/Wound Care: Routine pressure relief measures.  7. Fluids/Electrolytes/Nutrition: encourage PO -protein supp for low albumin  -Potassium 4.9 9/23 -getting a little dry---push fluids 8. 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              --Monitor HR TID--continue Eliquis and Lopressor  - added norvasc with improvement  Controlled on 9/23 9. HTN: Monitor BP TID--on avapro, Lopressor   Increased norvasc to 10mg     Controlled on 9/23 10. RUE shaking movements: Felt to be postural tremor and Keppra d/c 09/06 11. Leucocytosis:   Resolved. Likely steroid effect   12. Hyperlipidemia: Now on Lipitor   13.  Dysphagia  Advanced to  regular diet by SLP  LOS: 15 days A FACE TO FACE EVALUATION WAS PERFORMED  10/23 04/01/2021, 11:19 AM

## 2021-04-01 NOTE — Progress Notes (Signed)
Physical Therapy Session Note  Patient Details  Name: Patrick Padilla MRN: [id number] Date of Birth: Oct 04, 1955  Today's Date: 04/01/2021 PT Missed Time: 60 Minutes Missed Time Reason: Patient fatigue  Short Term Goals: Week 2:  PT Short Term Goal 1 (Week 2): Patient will perform basic transfers with CGA consistently using LRAD. PT Short Term Goal 2 (Week 2): Patient will ambulate >200 feet with CGA demonstrating visual scanning strategies to improve L attention with min cues. PT Short Term Goal 3 (Week 2): Patient will perform dynamic standing balance >5 min with CGA without upper extremity support. PT Short Term Goal 4 (Week 2): Patient will improve score on Berg Balance Scale >4 points to meet MCID.  Skilled Therapeutic Interventions/Progress Updates:     Pt misses skilled PT due to fatigue and sleeping upon PT arrival.  Therapy Documentation Precautions:  Precautions Precautions: Fall Precaution Comments: impulsive Restrictions Weight Bearing Restrictions: No Other Position/Activity Restrictions: Edema and pain with weight bearing for R knee and L wrist General: PT Amount of Missed Time (min): 60 Minutes PT Missed Treatment Reason: Patient fatigue   Therapy/Group: Individual Therapy  Beau Fanny, PT, DPT 03/30/2021, 4:20 PM

## 2021-04-01 NOTE — Progress Notes (Signed)
Inpatient Rehabilitation Care Coordinator Discharge Note   Patient Details  Name: Patrick Padilla MRN: [id number] Date of Birth: [birth year]   Discharge location: D/c to home with his son and his girlfriend.  Length of Stay: 15 days.  Discharge activity level: Supervision  Home/community participation: Limited  Patient response VV:OHYWVP Literacy - How often do you need to have someone help you when you read instructions, pamphlets, or other written material from your doctor or pharmacy?: Rarely  Patient response XT:GGYIRS Isolation - How often do you feel lonely or isolated from those around you?: Never  Services provided included: MD, RD, PT, SLP, RN, Pharmacy, Neuropsych, SW, TR, CM, OT  Financial Services:  Financial Services Utilized: Medicare    Choices offered to/list presented to: Yes  Follow-up services arranged:  Outpatient, Other (Comment) ([facility] arranged for further appointments)    Outpatient Servicies: [facility] for PT/OT/SLP   Patient response to transportation need: Is the patient able to respond to transportation needs?: Yes In the past 12 months, has lack of transportation kept you from medical appointments or from getting medications?: No In the past 12 months, has lack of transportation kept you from meetings, work, or from getting things needed for daily living?: No   Comments (or additional information):  Patient/Family verbalized understanding of follow-up arrangements:  Yes  Individual responsible for coordination of the follow-up plan: Contact pt son Patrick Hua [phone]  Confirmed correct DME delivered: [name] 04/01/2021    [name]

## 2021-04-01 NOTE — Progress Notes (Signed)
Speech Language Pathology Discharge Summary  Patient Details  Name: Patrick Padilla MRN: [id number] Date of Birth: [birth year]  Today's Date: 04/01/2021 SLP Individual Time: 90-1430 SLP Individual Time Calculation (min): 45 min   Skilled Therapeutic Interventions:  Skilled treatment session focused on completion of family education with the patient and his son. Both were educated regarding patient's current cognitive functioning and strategies to utilize at home to maximize problem solving, attention, recall and overall safety with functional and familiar tasks including the importance of 24 hour supervision. Education was also completed regarding the importance of abstaining from smoking and alcohol at home. Handouts were given to reinforce information. Patient left upright in wheelchair with alarm on and all needs within reach. Continue with current plan of care.  Patient has met 7 of 7 long term goals.  Patient to discharge at [facility] level.   Reasons goals not met: N/A   Clinical Impression/Discharge Summary: Patient has made slow but functional gains and has met 7 of 7 LTGs this admission. Currently, patient is consuming regular textures with thin liquids with minimal overt s/s of aspiration and is overall Mod I for use of swallowing compensatory strategies. Patient demonstrates improvement in overall cognitive functioning and requires overall Min A verbal and visual cues to complete functional and familiar tasks safely in regard to selective attention, problem solving, emergent awareness and use of memory compensatory strategies. Patient and family education is complete and patient will discharge home with 24 hour supervision from family. Patient would benefit from f/u SLP services to maximize his cognitive functioning and overall functional independence in order to reduce caregiver burden.   Care Partner:  Caregiver Able to Provide Assistance: Yes  Type of Caregiver Assistance:  Physical;Cognitive  Recommendation:  24 hour supervision/assistance;Outpatient SLP  Rationale for SLP Follow Up: Reduce caregiver burden;Maximize cognitive function and independence   Equipment: N/A  Reasons for discharge: Discharged from hospital;Treatment goals met   Patient/Family Agrees with Progress Made and Goals Achieved: Yes    Azura Tufaro, [place] 04/01/2021, 6:25 AM

## 2021-04-01 NOTE — Progress Notes (Signed)
Physical Therapy Session Note  Patient Details  Name: Patrick Padilla MRN: [id number] Date of Birth: Feb 04, 1956  Today's Date: 04/01/2021 PT Individual Time: 0900-1000 and 1435-1535 PT Individual Time Calculation (min): 60 min and 60 min   Short Term Goals: Week 2:  PT Short Term Goal 1 (Week 2): Patient will perform basic transfers with CGA consistently using LRAD. PT Short Term Goal 2 (Week 2): Patient will ambulate >200 feet with CGA demonstrating visual scanning strategies to improve L attention with min cues. PT Short Term Goal 3 (Week 2): Patient will perform dynamic standing balance >5 min with CGA without upper extremity support. PT Short Term Goal 4 (Week 2): Patient will improve score on Berg Balance Scale >4 points to meet MCID.  Skilled Therapeutic Interventions/Progress Updates:     Session 1: Patient in w/c in the room upon PT arrival. Patient alert and agreeable to PT session. Patient reported 2-3/10 back pain during session, RN made aware. PT provided repositioning, rest breaks, and distraction as pain interventions throughout session.   Therapeutic Activity: Transfers: Patient performed sit to/from stand x8 with mod I using RW.   Gait Training:  Patient ambulated >100 feet x3 using RW with supervision. Ambulated with decreased gait speed, decreased step length and height L>R, forward trunk lean, and decreased visual scanning. Provided verbal cues for visual scanning L>R, increased L foot clearance for safety, and proximity to RW x1. Patient demonstrates improved spatial awareness and visual scanning with mobility, required cues x1 to move to the R due to on coming hallway traffic on the L.  6 Min Walk Test:  Instructed patient to ambulate as quickly and as safely as possible for 6 minutes using LRAD. Patient was allowed to take standing rest breaks without stopping the test, but if the patient required a sitting rest break the clock would be stopped and the test would be over.   Results: 679 feet (207 meters, Avg speed 0.6 m/s) using a RW on RPE 7/10 at end of test. Results indicate that the patient has reduced endurance with ambulation compared to age matched norms (572 meters).  Improved from 0 score on evaluation due to inability to perform assessment.  Neuromuscular Re-ed: Patient performed the Berg Balance Test and Five Time Sit to Stand Test: Patient demonstrates reduced fall risk as noted by score of  52/56 on Berg Balance Scale.  (<36= high risk for falls, close to 100%; 37-45 significant >80%; 46-51 moderate >50%; 52-55 lower >25%) Improved from score of 2/52 on evaluation.  Five Time Sit to Stand Test (FTSS) Method: Use a straight back chair with a solid seat that is 16-18" high. Ask participant to sit on the chair with arms folded across their chest.   Instructions: "Stand up and sit down as quickly as possible 5 times, keeping your arms folded across your chest."   Measurement: Stop timing when the participant stands the 5th time.  TIME: __16.5____ (in seconds) Improved from 0 score on evaluation due to inability to perform assessment.   Times > 13.6 seconds is associated with increased disability and morbidity (Guralnik, 2000) Times > 15 seconds is predictive of recurrent falls in healthy individuals aged 58 and older (Buatois, et al., 2008) Normal performance values in community dwelling individuals aged 36 and older (Bohannon, 2006): 60-69 years: 11.4 seconds 70-79 years: 12.6 seconds 80-89 years: 14.8 seconds  MCID: ? 2.3 seconds for Vestibular Disorders Wray Kearns, 2006)  Educated patient on results and interpretation of all assessments performed and on  progress throughout stay.   Patient in w/c in the room at end of session with breaks locked, seat belt alarm set, and all needs within reach.   Session 2: Patient in w/c in the room with his son present for family education upon PT arrival. Patient alert and agreeable to PT session. Patient  denied pain during session.  Therapeutic Activity: Bed Mobility: Patient performed rolling R/L independently and supine to/from sit with supervision-mod I in an elevated, flat hospital bed without use of bed rails. Provided verbal cues for log roll technique to reduce back pain.  Transfers: Patient performed sit to/from stand from w/c, arm chair, ADL recliner, and mat table with mod I using RW.  Patient performed a simulated midsize SUV height car transfer with supervision using RW. Provided min cues for safe technique.  Gait Training:  Patient ambulated >200 feet x1 and >150 feet x2 using RW with distant supervision. Ambulated as above. Educated patient's son on cuing for visual scanning, especially in the community, energy conservation techniques, and strategies to provide supervision on outings for patient's safety. Patient ambulated up/down a ramp, over 10 feet of mulch (unlevel surface), and up/down a curb to simulate community ambulation over unlevel surfaces with close supervision using RW. Provided cues for technique and use of AD. Patient ascended/descended 12x6" steps using L rail to simulate home set-up with CGA-close supervision. Performed reciprocal gait pattern while ascending and step-to gait pattern while descending. Provided cues for technique and sequencing. Demonstrated first 4 steps then patient's son performed the remaining steps with the patient demonstrating safe guarding technique and following education on remaining on the downhill side of the patient on the steps   Neuromuscular Re-ed: Provided and reviewed the following motor control and balance activities prescribed as HEP: Access Code: [email or web address]  Single Leg Stance - 2 x daily - 7 x weekly - 2 sets - 5 reps - 10-30 sec hold Tandem Stance - 2 x daily - 7 x weekly - 2 sets - 5 reps - 10-30 sec hold Sit to Stand with Arms Crossed - 2 x daily - 7 x weekly - 2 sets - 10 reps Side Stepping with Counter Support - 2 x daily - 7  x weekly - 2 sets - 5 reps  Recommended use of RW at all times for safety until cleared by OPPT. Patietn son provided safe guarding with all mobility above. Discussed night time supervision with use of a baby monitor. Recommending full supervision with all mobility over the weekend then progressing to short household mobility without supervision if patient demonstrates good safety awareness over the weekend as he currently is mod I with in room mobility >75% of the time.   Educated patient and his son on fall risk/prevention, home modifications to prevent falls, and activation of emergency services in the event of a fall during session.   Patient and his son in agreement with all recommendations and very appreciative of education provided.   Patient in bed with his son in the room at end of session with breaks locked, bed alarm set, and all needs within reach.    Therapy Documentation Precautions:  Precautions Precautions: Fall Precaution Comments: impulsive Restrictions Weight Bearing Restrictions: No  Balance: Berg Balance Test Sit to Stand: Able to stand without using hands and stabilize independently Standing Unsupported: Able to stand safely 2 minutes Sitting with Back Unsupported but Feet Supported on Floor or Stool: Able to sit safely and securely 2 minutes Stand to Sit: Sits safely  with minimal use of hands Transfers: Able to transfer safely, minor use of hands Standing Unsupported with Eyes Closed: Able to stand 10 seconds safely Standing Ubsupported with Feet Together: Able to place feet together independently and stand 1 minute safely From Standing, Reach Forward with Outstretched Arm: Can reach confidently >25 cm (10") From Standing Position, Pick up Object from Floor: Able to pick up shoe safely and easily From Standing Position, Turn to Look Behind Over each Shoulder: Looks behind from both sides and weight shifts well Turn 360 Degrees: Able to turn 360 degrees safely in  4 seconds or less Standing Unsupported, Alternately Place Feet on Step/Stool: Able to stand independently and safely and complete 8 steps in 20 seconds Standing Unsupported, One Foot in Front: Able to plae foot ahead of the other independently and hold 30 seconds Standing on One Leg: Tries to lift leg/unable to hold 3 seconds but remains standing independently Total Score: 52    Therapy/Group: Individual Therapy  Nusayba Cadenas L Taurean Ju PT, DPT  04/01/2021, 3:48 PM

## 2021-04-01 NOTE — Progress Notes (Signed)
Occupational Therapy Discharge Summary/Family Education  Patient Details  Name: Patrick Padilla MRN: [id number] Date of Birth: Sep 16, 1955  Today's Date: 04/01/2021 OT Individual Time: 1300-1345 OT Individual Time Calculation (min): 45 min   Family Education Session Pt received in bed with no complaints of pain and agreeable to OT session. Pt requested to shower since son had not arrived yet. Skilled treatment session focused on completion of family education with pt and his son. Both were educated on current level of functioning for BADL participation, utilization of RW and TTB while at home, strategies for external distraction and L inattention, and provided with a handout on supervision level care for ADLs (listed below). Information on handout was verbalized during session and offered for reference at home. Son was told to intervene when pt becomes externally distracted by normal routine, and offered skilled strategies to promote safety. Both verbalized understanding.   ADL: Pt completes BADL at overall SUPERVISION level. Skilled interventions include: Pt completed showering, grooming and dressing tasks with close (S). Improvement noted with less cuing needed to redirect to task. Pt ready to return home tomorrow, and needed reminders during session that he was not going home today. Pt able to divide attention when having conversation and showering appropriately. Pt completed functional mobility to all therapeutic destinations with RW and close (S). Pt demonstrated appropriate use of TTB with close (S) and education provided for management of shower curtain and wet floor safety. Son return demonstrated close supervision back to room with pt. Education provided on RW management with distractions and L inattention.  Pt left at end of session in wc with exit alarm on, call light in reach and all needs met with SLP entering for family ed.   Supervision Level Handout:   General mobility- they should  always use their RW. They may benefit from a walker tray to transport items from room to room if walking with a walker is recommended by physical therapy. The walker should be kept within reach so they can pull it close to get up and keep with them to back up to any surface they want to sit on. When getting up, they should push up from the surface they are getting up from and reach back when sitting to a new surface, no plopping.  You are their "shadow." Especially in the beginning. You, as the helper should be in reach of the patient when mobilizing. You should be either beside or behind them so if they lose their balance you can assist by helping correct at the hips. This is likely closer than you are used to being- be in their personal space. Use a gait belt if that makes you feel more comfortable If you are attempting to get up/transfer and it is not going well, reset. Have them sit back down. Make sure they are close to the edge of the seat, feet are underneath them at hips distance, and they are leaning forward to stand up. Bathing- they should sit to bathe on a shower chair, especially for washing legs/feet. Sitting will save energy and increase safety. For a tub shower with shower chair: Use the walker to walk up to shower/tub edge and leave it to the side, but close. they can use the wall to steady as they step over or a grab bar. Do your best to dry off the floor prior to getting out of the shower For tub shower with Tub bench: use walker to get to the edge of the tub bench, back  up to the edge, reach back prior to sitting down. Turn to swing legs into tub and scoot across. Reverse to exit the tub. Make sure both legs are out of the tub prior to standing to exit the bathroom Walk in shower: walk up to the shower ledge, turn around and back up to the ledge with the walker. Keep both hands-on walker while they step back one foot at a time Dressing- all should be done from a SEATED level, especially to  put underwear and pants over feet.  Toileting- the RW can be walked right over the toilet for standing urination if applicable. If seated toileting is more appropriate, have them walk up to the toilet and keep walker with them as they turn to sit to toilet or BSC. Before they stand to pull up pants past hips they should pull pants/underwear up past their knees to decrease the need to bend forward to the floor. Sometimes this makes people dizzy if incontinence/bathroom accidents are an issue attempt to toilet every 2-3 hours to improve success with toileting and decrease accidents.  Energy conservation principles- Prioritize what needs to be done and what can be moved to another day Plan out their days, weeks, months to spread out taxing (physical or cognitively tiring) activities to not put too much at one time Pace activities- rest before feeling tired and have designated places to rest if they feel tired and need to take a brake Position for success: sit when able to conserve 25% more energy than standing   Patient has met 12 of 12 long term goals due to improved activity tolerance, improved balance, postural control, improved attention, improved awareness, and improved coordination.  Patient to discharge at overall Supervision level.  Patient's care partner is independent to provide the necessary physical and cognitive assistance at discharge.  Pt has made good progress towards goals. Continues to have deficits in RW management, L side inattention and attention to task to be addressed in Axis.    Recommendation:  Patient will benefit from ongoing skilled OT services in home health setting to continue to advance functional skills in the area of BADL, iADL, and Reduce care partner burden.  Equipment: TTB  Reasons for discharge: treatment goals met and discharge from hospital  Patient/family agrees with progress made and goals achieved: Yes  OT Discharge Precautions/Restrictions   Precautions Precautions: Fall Precaution Comments: impulsive Restrictions Weight Bearing Restrictions: No General   Vital Signs Therapy Vitals Pulse Rate: 69 Resp: 18 BP: 97/70 Patient Position (if appropriate): Lying Oxygen Therapy SpO2: 99 % O2 Device: Room Air Pain  No pain reported.  ADL ADL Grooming: Supervision/safety Where Assessed-Grooming: Wheelchair Upper Body Bathing: Supervision/safety Where Assessed-Upper Body Bathing: Shower, Chair Lower Body Bathing: Supervision/safety Where Assessed-Lower Body Bathing: Shower, Chair Upper Body Dressing: Supervision/safety Where Assessed-Upper Body Dressing: Standing at sink Lower Body Dressing: Supervision/safety Where Assessed-Lower Body Dressing: Chair Toileting: Supervision/safety Where Assessed-Toileting: Glass blower/designer: Close supervision Toilet Transfer Method: Counselling psychologist: Raised toilet seat Tub/Shower Transfer: Close supervison Clinical cytogeneticist Method: Optometrist: Facilities manager: Close supervision Social research officer, government Method: Heritage manager: Grab bars ADL Comments: completes ADL with close (S) and subtle cuing for attn to task Vision Eye Alignment: Impaired (comment) Ocular Range of Motion: Within Functional Limits Alignment/Gaze Preference: Within Defined Limits Tracking/Visual Pursuits: Able to track stimulus in all quads without difficulty Saccades: Undershoots Convergence: Within functional limits Perception  Perception: Impaired Inattention/Neglect: Does not attend to  left visual field Praxis Praxis: Intact Praxis Impairment Details: Motor planning Cognition Overall Cognitive Status: Impaired/Different from baseline Arousal/Alertness: Awake/alert Orientation Level: Oriented X4 Attention: Sustained;Selective Focused Attention: Appears intact Sustained Attention: Appears intact Selective  Attention: Impaired Selective Attention Impairment: Functional basic Memory: Impaired Memory Impairment: Storage deficit;Decreased recall of new information;Decreased short term memory Awareness: Impaired Awareness Impairment: Emergent impairment Problem Solving: Impaired Problem Solving Impairment: Functional basic Self Monitoring: Impaired Behaviors: Impulsive Safety/Judgment: Impaired Sensation Sensation Light Touch: Appears Intact Hot/Cold: Appears Intact Proprioception: Appears Intact Stereognosis: Appears Intact Coordination Gross Motor Movements are Fluid and Coordinated: No Fine Motor Movements are Fluid and Coordinated: No Motor    Mobility  Bed Mobility Bed Mobility: Rolling Right;Rolling Left;Supine to Sit;Sit to Supine Rolling Right: Independent Rolling Left: Independent Supine to Sit: Supervision/Verbal cueing Sit to Supine: Supervision/Verbal cueing Transfers Sit to Stand: Supervision/Verbal cueing;Independent with assistive device Stand to Sit: Supervision/Verbal cueing;Independent with assistive device  Trunk/Postural Assessment  Cervical Assessment Cervical Assessment: Exceptions to [facility] (forward head) Thoracic Assessment Thoracic Assessment: Exceptions to [facility] (rounded shoulders) Lumbar Assessment Lumbar Assessment: Exceptions to [facility] (posterior pelvic tilt) Postural Control Postural Control: Deficits on evaluation (righting reaction)  Extremity/Trunk Assessment  LUE Assessment LUE Assessment: Within Functional Limits  R UE Assessment: Within Functional Limits  Patrick Padilla 04/01/2021, 7:00 AM

## 2021-04-02 DIAGNOSIS — I63511 Cerebral infarction due to unspecified occlusion or stenosis of right middle cerebral artery: Secondary | ICD-10-CM | POA: Diagnosis not present

## 2021-04-02 DIAGNOSIS — I10 Essential (primary) hypertension: Secondary | ICD-10-CM | POA: Diagnosis not present

## 2021-04-02 DIAGNOSIS — E876 Hypokalemia: Secondary | ICD-10-CM | POA: Diagnosis not present

## 2021-04-02 LAB — BASIC METABOLIC PANEL
Anion gap: 8 (ref 5–15)
BUN: 24 mg/dL — ABNORMAL HIGH (ref 8–23)
CO2: 25 mmol/L (ref 22–32)
Calcium: 9.1 mg/dL (ref 8.9–10.3)
Chloride: 104 mmol/L (ref 98–111)
Creatinine, Ser: 1.09 mg/dL (ref 0.61–1.24)
GFR, Estimated: >60 mL/min (ref >60)
Glucose, Bld: 115 mg/dL — ABNORMAL HIGH (ref 70–99)
Potassium: 4.6 mmol/L (ref 3.5–5.1)
Sodium: 137 mmol/L (ref 135–145)

## 2021-04-02 MED ORDER — AMLODIPINE BESYLATE 10 MG PO TABS
10.0000 mg | ORAL_TABLET | Freq: Every day | ORAL | 1 refills | Status: DC
Start: 2021-04-03 — End: 2021-05-31

## 2021-04-02 MED ORDER — ACETAMINOPHEN 325 MG PO TABS: 325.0000 mg | ORAL_TABLET | ORAL | Status: DC | PRN

## 2021-04-02 MED ORDER — POLYETHYLENE GLYCOL 3350 17 G PO PACK: 17.0000 g | PACK | Freq: Two times a day (BID) | ORAL | 0 refills | Status: DC

## 2021-04-02 MED ORDER — METOPROLOL TARTRATE 100 MG PO TABS: 100.0000 mg | ORAL_TABLET | Freq: Two times a day (BID) | ORAL | 1 refills | Status: DC

## 2021-04-02 NOTE — Progress Notes (Signed)
INPATIENT REHABILITATION DISCHARGE NOTE   Discharge instructions by:Chevon Fomby RN  Verbalized understanding:yes by son  Skin care/Wound care:none  Pain:none  IV's:none  Tubes/Drains:none  Safety instructions:done  Patient belongings:done  Discharged MA:[id number]  Discharged TXH:[id number]  Notes:pt necklace with him found by son in his belongings

## 2021-04-02 NOTE — Progress Notes (Signed)
PROGRESS NOTE   Subjective/Complaints: Pt up in bed. Ready to go home! Said he had to go splits some logs!!  ROS: Patient denies fever, rash, sore throat, blurred vision, nausea, vomiting, diarrhea, cough, shortness of breath or chest pain, headache, or mood change.   Objective:   No results found. No results for input(s): WBC, HGB, HCT, PLT in the last 72 hours.   Recent Labs    04/01/21 0553 04/02/21 0636  NA 137 137  K 4.9 4.6  CL 104 104  CO2 25 25  GLUCOSE 120* 115*  BUN 24* 24*  CREATININE 1.15 1.09  CALCIUM 9.1 9.1     Intake/Output Summary (Last 24 hours) at 04/02/2021 0857 Last data filed at 04/02/2021 0656 Gross per 24 hour  Intake 480 ml  Output 700 ml  Net -220 ml        Physical Exam: Vital Signs Blood pressure 101/64, pulse 78, temperature 98.3 F (36.8 C), temperature source Oral, resp. rate 16, height 6' (1.829 m), weight 98.7 kg, SpO2 97 %. Constitutional: No distress . Vital signs reviewed. HEENT: NCAT, EOMI, oral membranes moist Neck: supple Cardiovascular: RRR without murmur. No JVD    Respiratory/Chest: CTA Bilaterally without wheezes or rales. Normal effort    GI/Abdomen: BS +, non-tender, non-distended Ext: no clubbing, cyanosis, or edema Psych: pleasant and cooperative  Skin: scattered abrasions Musc: No edema in extremities. Mild left shoulder soreness Neuro: Alert and oriented to person, place, date. Still lacks awareness. Motor: RUE 5/5.  RLE: 4/5, stable LUE: 4-/5 proximal to distal.  LLE: 4/5 HF, KE,  3/5 ADF  Assessment/Plan: 1. Functional deficits which require 3+ hours per day of interdisciplinary therapy in a comprehensive inpatient rehab setting. Physiatrist is providing close team supervision and 24 hour management of active medical problems listed below. Physiatrist and rehab team continue to assess barriers to discharge/monitor patient progress toward functional and  medical goals  Care Tool:  Bathing    Body parts bathed by patient: Right arm, Left arm, Chest, Abdomen, Front perineal area, Buttocks, Right upper leg, Left upper leg, Right lower leg, Face, Left lower leg   Body parts bathed by helper: Right arm, Buttocks, Right upper leg, Left upper leg, Right lower leg, Left lower leg     Bathing assist Assist Level: Supervision/Verbal cueing     Upper Body Dressing/Undressing Upper body dressing   What is the patient wearing?: Pull over shirt    Upper body assist Assist Level: Supervision/Verbal cueing    Lower Body Dressing/Undressing Lower body dressing      What is the patient wearing?: Pants, Incontinence brief     Lower body assist Assist for lower body dressing: Supervision/Verbal cueing     Toileting Toileting    Toileting assist Assist for toileting: Supervision/Verbal cueing     Transfers Chair/bed transfer  Transfers assist  Chair/bed transfer activity did not occur: Safety/medical concerns  Chair/bed transfer assist level: Independent with assistive device Chair/bed transfer assistive device: Geologist, engineering   Ambulation assist   Ambulation activity did not occur: Safety/medical concerns  Assist level: Supervision/Verbal cueing Assistive device: Walker-rolling Max distance: 679 ft   Walk 10  feet activity   Assist  Walk 10 feet activity did not occur: Safety/medical concerns  Assist level: Supervision/Verbal cueing Assistive device: Walker-rolling   Walk 50 feet activity   Assist Walk 50 feet with 2 turns activity did not occur: Safety/medical concerns  Assist level: Supervision/Verbal cueing Assistive device: Walker-rolling    Walk 150 feet activity   Assist Walk 150 feet activity did not occur: Safety/medical concerns  Assist level: Supervision/Verbal cueing Assistive device: Walker-rolling    Walk 10 feet on uneven surface  activity   Assist Walk 10 feet on uneven  surfaces activity did not occur: Safety/medical concerns   Assist level: Supervision/Verbal cueing Assistive device: Walker-rolling   Wheelchair     Assist Is the patient using a wheelchair?: No   Wheelchair activity did not occur: Safety/medical concerns (L wrist edema and pain, awaiting x-ray results, NWB during eval)         Wheelchair 50 feet with 2 turns activity    Assist    Wheelchair 50 feet with 2 turns activity did not occur: Safety/medical concerns       Wheelchair 150 feet activity     Assist  Wheelchair 150 feet activity did not occur: Safety/medical concerns       Blood pressure 101/64, pulse 78, temperature 98.3 F (36.8 C), temperature source Oral, resp. rate 16, height 6' (1.829 m), weight 98.7 kg, SpO2 97 %.  Medical Problem List and Plan: 1.  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  Antithrombotics: -DVT/anticoagulation:   Eliquis   09/08             3. Left shoulder/right knee pain/Pain Management: Local measures with Voltaren gel/ice/heat.              -acute gout right knee and left wrist--responsive to prednisone  -prednisone 10mg  completed on 9/16  4. Mood:  paxil             -antipsychotic agents: N/A 5. Neuropsych: This patient is not  capable of making decisions on his own behalf. 6. Skin/Wound Care: Routine pressure relief measures.  7. 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              --Monitor HR TID--continue Eliquis and Lopressor  - added norvasc with improvement  Controlled on 9/23 8. HTN:  avapro, Lopressor    norvasc to 10mg       10. RUE shaking movements: Felt to be postural tremor and Keppra d/c 09/06 11. Leucocytosis:   Resolved. Likely steroid effect   12. Hyperlipidemia: Now on Lipitor   13.  Dysphagia  Advanced to regular diet by SLP  LOS: 16 days A FACE TO FACE EVALUATION WAS PERFORMED  04/02/2021,  8:57 AM

## 2021-04-04 NOTE — Progress Notes (Signed)
Patient does not have Medicare part D coverage and Eliquis assistance stopped in Dec 2021. Did contact company who will mail paperwork to patient to fill out and mail back in. LCSW put MATCH in to assist with $400 medication coverage which brought down the copay to $100. Marland Kitchen He also does not have PCP as goes to Urgent care prn for med refills. I set him up with [facility] --1st appt 11/07 but they indicated that he should be able to get meds refilled at [facility]. He has almost 3 months of Telmisartan 40 mg--advised them to stop Avapro and to resume Telmisartan 1/2 tab ( 20 mg) as equivalent daily dose. Family was advised of above as well as need to set f/u with cards and neurology.

## 2021-04-04 NOTE — Discharge Summary (Signed)
Physician Discharge Summary  Patient ID: Patrick Padilla MRN: [id number] DOB/AGE: 10-28-1955 65 y.o.  Admit date: 03/17/2021 Discharge date: 04/02/2021  Discharge Diagnoses:  Principal Problem:   Acute ischemic right MCA stroke [facility]) Active Problems:   Longstanding persistent atrial fibrillation (HCC)   Leucocytosis   Benign essential HTN   Acute gout of right knee   Dysphagia   Discharged Condition: stable  Significant Diagnostic Studies: N/a   Labs:  Basic Metabolic Panel: Recent Labs  Lab 03/28/21 0628 04/01/21 0553 04/02/21 0636  NA 136 137 137  K 5.2* 4.9 4.6  CL 100 104 104  CO2 27 25 25   GLUCOSE 116* 120* 115*  BUN 22 24* 24*  CREATININE 1.22 1.15 1.09  CALCIUM 9.1 9.1 9.1    CBC: CBC Latest Ref Rng & Units 03/28/2021 03/25/2021 03/21/2021  WBC 4.0 - 10.5 K/uL 11.8(H) 17.2(H) 15.7(H)  Hemoglobin 13.0 - 17.0 g/dL 05/21/2021 62.9 52.8  Hematocrit 39.0 - 52.0 % 50.3 47.9 41.3  Platelets 150 - 400 K/uL 563(H) 583(H) 400     CBG: No results for input(s): GLUCAP in the last 168 hours.  Brief HPI:   Patrick Padilla is a 65 y.o. RH-male with history of A. fib s/p CVA in the past, EtOH abuse who was admitted with confusion, chest pain and diaphoresis.  He was found to be in A. fib with RVR with serial attempts in route to ED.  At admission he was hemiplegic, obtunded with hypoxic respiratory failure requiring intubation and had jerking movements of artery BUE.  CTP showed right MCA infarct with penumbra and he underwent cerebral angio with thrombectomy R-M1/MCA occlusion with complete recanalization.  He was placed on Cerebyx for BP control and started on Keppra due to concerns of seizure.  LTM-EEG was negative for seizure and he tolerated extubation without difficulty.    Dr. 65 felt the stroke was embolic due to A. fib s/p cardioversion and follow-up MRI brain showed acute infarct right basal ganglia with 3 punctate bifrontal peripheral infarcts and recent left thalamic infarct as  well as PR ES appearance in bilateral parieto-occipital cortex with small volume blood products in the right sylvian fissure.  He was started on aspirin due to his moderate-sized stroke with petechial hemorrhage was transitioned to Eliquis.  Hospital course significant for issues with left shoulder pain, left-sided weakness with fatigue, left inattention, cognitive deficits with poor awareness of deficits.  CIR was recommended due to functional decline.   Hospital Course: Patrick Padilla was admitted to rehab 03/17/2021 for inpatient therapies to consist of PT, ST and OT at least three hours five days a week. Past admission physiatrist, therapy team and rehab RN have worked together to provide customized collaborative inpatient rehab. He was maintained on Eliquis during his stay and serial CBC showed that acute blood loss anemia has resolved.   Protein supplement added due to low albumin levels.  X-ray of right knee showed moderate knee effusion without acute abnormality and right wrist films showed mild degenerative changes in first [facility] and radiocarpal joint. Symptoms felt to be consistent with gout flare and this has resolved with short course of prednisone. He did develop leucocytosis due to gout flare and this is resolving without signs of infection.   Check of lytes revealed hypokalemia which has resolved with brief supplementation. His blood pressures was monitored on TID basis and amlodipine was added for better control.  Heart rate has been stable on current dose of Lopressor. His swallow function has improved and  he was advanced to regular textures. He is voiding without difficulty and is continent of bowel and bladder. He has made good gains during his rehab stay but continues to be limited by pain, needs cues for visual scanning and decreased endurance. Supervision is recommended for safety. He will continue to receive follow up outpatient PT, OT and ST at [facility] after discharge.    Rehab  course: During patient's stay in rehab weekly team conferences were held to monitor patient's progress, set goals and discuss barriers to discharge. At admission, patient required max assist with mobility and total assist with basic ADL tasks. He was maintained on dysphagia 3 diet with thin liquids and full supervision for compensatory strategies.  He exhibited moderate cognitive impairments affecting safety with functional and familiar tasks.He  has had improvement in activity tolerance, balance, postural control as well as ability to compensate for deficits. He requires supervision for transfers and to ambulate > 100' X 3 with RW. BERG balance score has improved to 52/56.  He was tolerating regular textures with minimal S/S of aspiration and was able to utilize swallow strategies independently.  He showed improvement in cognitive functioning and required min verbal and visual cues to complete functional familiar tasks. Family education was completed regarding all aspects of safety and care.  Discharge disposition: 01-Home or Self Care  Diet: Heart Healthy.   Special Instructions: No driving till cleared by MD. No alcohol or strenuous activity.   Allergies as of 04/02/2021   No Known Allergies      Medication List     STOP taking these medications    naproxen sodium 220 MG tablet Commonly known as: ALEVE   thiamine 100 MG tablet       TAKE these medications    acetaminophen 325 MG tablet Commonly known as: TYLENOL Take 1-2 tablets (325-650 mg total) by mouth every 4 (four) hours as needed for mild pain.   amLODipine 10 MG tablet Commonly known as: NORVASC Take 1 tablet (10 mg total) by mouth daily.   apixaban 5 MG Tabs tablet Commonly known as: ELIQUIS Take 1 tablet (5 mg total) by mouth 2 (two) times daily.   atorvastatin 40 MG tablet Commonly known as: LIPITOR Take 1 tablet (40 mg total) by mouth daily.   folic acid 1 MG tablet Commonly known as: FOLVITE Take 1  tablet (1 mg total) by mouth daily.   irbesartan 75 MG tablet Commonly known as: AVAPRO Take 1 tablet (75 mg total) by mouth daily.   metoprolol tartrate 100 MG tablet Commonly known as: LOPRESSOR Take 1 tablet (100 mg total) by mouth 2 (two) times daily.   multivitamin with minerals Tabs tablet Take 1 tablet by mouth daily.   PARoxetine 20 MG tablet Commonly known as: PAXIL Take 20 mg by mouth daily.   polyethylene glycol 17 g packet Commonly known as: MIRALAX / GLYCOLAX Take 17 g by mouth 2 (two) times daily.   senna-docusate 8.6-50 MG tablet Commonly known as: Senokot-S Place 1 tablet into feeding tube at bedtime as needed for moderate constipation.        Follow-up Information     Croitoru, Mihai, MD Follow up.   Specialty: Cardiology Contact information: [address] [place] [address] [phone]         Micki Riley, MD. Call.   Specialties: Neurology, Radiology Why: for stroke follow up appt Contact information: [address] [place] [address] [phone]  Ranelle Oyster, MD Follow up.   Specialty: Physical Medicine and Rehabilitation Contact information: [address] [place] [address] [place] [address] [phone]         [facility] Follow up on 05/16/2021.   Why: Appointment at Office Tel# [phone] Contact information: [address] [place] [address]                Signed: Jacquelynn Cree 04/04/2021, 5:47 PM

## 2021-04-07 ENCOUNTER — Telehealth: Payer: Self-pay | Admitting: Cardiovascular Disease

## 2021-04-07 NOTE — Telephone Encounter (Signed)
  Patient calling the office for samples of medication:   1.  What medication and dosage are you requesting samples for?apixaban (ELIQUIS) 5 MG TABS tablet  2.  Are you currently out of this medication? Yes  Pt's brother said, pt was in the hospital recently and was prescribed eliquis, it will cost them $200-$300 and can't afford it, needs samples until insurance fixe it

## 2021-04-07 NOTE — Telephone Encounter (Signed)
Pt made aware samples available for pick up.  Eliquis 5 mg daily Qty: 2 boxes Exp: 8/24 Lot # [id number]

## 2021-04-07 NOTE — Telephone Encounter (Signed)
Qualifies for sample of eliquis 5mg  bid Please attach pt assistance forms :   Routing to nl triage

## 2021-04-13 ENCOUNTER — Ambulatory Visit: Payer: Medicare Other

## 2021-04-13 ENCOUNTER — Ambulatory Visit: Payer: Medicare Other | Attending: Physician Assistant | Admitting: Occupational Therapy

## 2021-04-13 ENCOUNTER — Other Ambulatory Visit: Payer: Self-pay

## 2021-04-13 ENCOUNTER — Encounter: Payer: Self-pay | Admitting: Occupational Therapy

## 2021-04-13 ENCOUNTER — Encounter: Payer: Self-pay | Admitting: Registered Nurse

## 2021-04-13 ENCOUNTER — Encounter (HOSPITAL_BASED_OUTPATIENT_CLINIC_OR_DEPARTMENT_OTHER): Payer: Medicare Other | Admitting: Registered Nurse

## 2021-04-13 VITALS — BP 117/67 | HR 68 | Temp 97.9°F | Ht 72.0 in | Wt 207.0 lb

## 2021-04-13 DIAGNOSIS — M25612 Stiffness of left shoulder, not elsewhere classified: Secondary | ICD-10-CM | POA: Diagnosis not present

## 2021-04-13 DIAGNOSIS — R2689 Other abnormalities of gait and mobility: Secondary | ICD-10-CM | POA: Diagnosis not present

## 2021-04-13 DIAGNOSIS — R41841 Cognitive communication deficit: Secondary | ICD-10-CM | POA: Insufficient documentation

## 2021-04-13 DIAGNOSIS — R2681 Unsteadiness on feet: Secondary | ICD-10-CM | POA: Insufficient documentation

## 2021-04-13 DIAGNOSIS — I10 Essential (primary) hypertension: Secondary | ICD-10-CM | POA: Insufficient documentation

## 2021-04-13 DIAGNOSIS — M6281 Muscle weakness (generalized): Secondary | ICD-10-CM

## 2021-04-13 DIAGNOSIS — I63511 Cerebral infarction due to unspecified occlusion or stenosis of right middle cerebral artery: Secondary | ICD-10-CM | POA: Insufficient documentation

## 2021-04-13 DIAGNOSIS — I4891 Unspecified atrial fibrillation: Secondary | ICD-10-CM | POA: Insufficient documentation

## 2021-04-13 DIAGNOSIS — R41840 Attention and concentration deficit: Secondary | ICD-10-CM | POA: Insufficient documentation

## 2021-04-13 DIAGNOSIS — M25512 Pain in left shoulder: Secondary | ICD-10-CM | POA: Diagnosis not present

## 2021-04-13 MED ORDER — APIXABAN 5 MG PO TABS
5.0000 mg | ORAL_TABLET | Freq: Two times a day (BID) | ORAL | 3 refills | Status: DC
  Filled 2021-04-13: qty 60, 30d supply, fill #0

## 2021-04-13 NOTE — Therapy (Signed)
[facility] [address] [place], [place], [address] Phone: [phone]   Fax:  [phone]  Occupational Therapy Evaluation  Patient Details  Name: Patrick Padilla MRN: [id number] Date of Birth: 1956/07/01 Referring Provider (OT): Angiulli   Encounter Date: 04/13/2021   OT End of Session - 04/13/21 1341     Visit Number 1    Number of Visits 17    Date for OT Re-Evaluation 06/27/21    Authorization Type Medicare A&B, 20% coinsurance    Progress Note Due on Visit 20    OT Start Time 1145    OT Stop Time 1235    OT Time Calculation (min) 50 min    Activity Tolerance Patient tolerated treatment well    Behavior During Therapy WFL for tasks assessed/performed             Past Medical History:  Diagnosis Date   Anxiety    Elevated LFTs    History of colon polyps    HLD (hyperlipidemia)    HTN (hypertension)    Localized superficial swelling, mass, or lump     Past Surgical History:  Procedure Laterality Date   CARDIOVERSION N/A 02/05/2019   Procedure: CARDIOVERSION;  Surgeon: Wendall Stade, MD;  Location: [facility] ENDOSCOPY;  Service: Cardiovascular;  Laterality: N/A;   CERVICAL DISC SURGERY     IR CT HEAD LTD  03/12/2021   IR PERCUTANEOUS ART THROMBECTOMY/INFUSION INTRACRANIAL INC DIAG ANGIO  03/12/2021   IR US GUIDE VASC ACCESS RIGHT  03/15/2021   LEG SURGERY     right leg and ankle   NECK SURGERY     cyst removed   RADIOLOGY WITH ANESTHESIA N/A 03/12/2021   Procedure: RADIOLOGY WITH ANESTHESIA;  Surgeon: Radiologist, Medication, MD;  Location: [facility] OR;  Service: Radiology;  Laterality: N/A;   TEE WITHOUT CARDIOVERSION N/A 02/05/2019   Procedure: TRANSESOPHAGEAL ECHOCARDIOGRAM (TEE);  Surgeon: Wendall Stade, MD;  Location: [facility] ENDOSCOPY;  Service: Cardiovascular;  Laterality: N/A;   TOTAL HIP ARTHROPLASTY     left    There were no vitals filed for this visit.   Subjective Assessment - 04/13/21 1200      Subjective  Back to normal.  I like being independent.    Patient is accompanied by: Family member   son Patrick Hua   Pertinent History HTN, Afib, Anxiety, Multiple orto injuries    Currently in Pain? Yes    Pain Score 4     Pain Location Shoulder    Pain Orientation Left    Pain Descriptors / Indicators Aching    Pain Type Acute pain    Pain Onset 1 to 4 weeks ago    Pain Frequency Intermittent    Aggravating Factors  movement    Pain Relieving Factors rest               [facility] OT Assessment - 04/13/21 1203       Assessment   Medical Diagnosis infarct R BG    Referring Provider (OT) Angiulli    Onset Date/Surgical Date 03/12/21    Hand Dominance Right    Next MD Visit 05/16/21    Prior Therapy CIR      Precautions   Precaution Comments No driving, no smoking, no drinking, no heavy lifting      Balance Screen   Has the patient fallen in the past 6 months No      Home  Environment   Lives With Son   x  3 yrs     Prior Function   Level of Independence Independent with basic ADLs    Vocation On disability    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      IADL   Prior Level of Function Shopping Independent    Shopping Needs to be accompanied on any shopping trip    Prior Level of Function Light Housekeeping Independent    Light Housekeeping Does not participate in any housekeeping tasks    Prior Level of Function Meal Prep Independent    Meal Prep Needs to  have meals prepared and served    Prior Level of Function Medication Managment independent    Medication Management Takes responsibility if medication is prepared in advance in seperate dosage      Written Expression   Dominant Hand Right      Vision - History   Baseline Vision Wears glasses all the time      Vision Assessment   Eye Alignment Within Functional Limits    Ocular Range of Motion Within Functional Limits    Alignment/Gaze Preference Within Defined Limits    Tracking/Visual Pursuits Able to track stimulus in all quads without difficulty    Comment Mild extraneous lateral eye movement with eye gaze in superior / left fields.      Activity Tolerance   Activity Tolerance Comments Son reports patient was a "couch potato" before the stroke.  Patient reports having no motivation to do anything currently.      Cognition   Overall Cognitive Status Impaired/Different from baseline    Cognition Comments Reports feelings of being overwhelmed - "a lot on my plate" going thru divorce, buying a house.  Patient reports less concentration/ memory (e.g.appointments, medications) and no motivation.      Posture/Postural Control   Posture/Postural Control Postural limitations    Posture Comments Limited lumbar thoracic flex/ext/rotation - prior back injury      Sensation   Light Touch Impaired by gross assessment   mild numbness at fingertips   Additional Comments Mild numbness Left fingertips      Coordination   9 Hole Peg Test Right;Left    Right 9 Hole Peg Test 29.87    Left 9 Hole Peg Test 34.18      Tone   Assessment Location Left Upper Extremity      ROM / Strength   AROM / PROM / Strength AROM      AROM   Overall AROM  Deficits    AROM Assessment Site Shoulder    Right/Left Shoulder Left    Left Shoulder Flexion 120 Degrees    Left Shoulder ABduction 95 Degrees   painful     Hand Function   Right Hand Gross Grasp Functional    Right Hand Grip (lbs) 91.9    Right  Hand Lateral Pinch 19 lbs    Left Hand Gross Grasp Functional  Left Hand Grip (lbs) 78.9    Left Hand Lateral Pinch 18 lbs      LUE Tone   LUE Tone Mild;Hypertonic      LUE Tone   Hypertonic Details forearm, wrist                              OT Education - 04/13/21 1341     Education Details OT plan of care and potential goals    Person(s) Educated Patient    Methods Explanation    Comprehension Need further instruction;Verbalized understanding              OT Short Term Goals - 04/13/21 1700       OT SHORT TERM GOAL #1   Title Patient will complete an HEP designed to address left shoulder pain and range of motion    Time 4    Period Weeks    Status New      OT SHORT TERM GOAL #2   Title Patient will demonstrate improved safety and confidence (patient report) with transferring into and out of shower.    Time 4    Period Weeks    Status New      OT SHORT TERM GOAL #3   Title Patient will demonstrate improved performance - decreased time, decreased pain in donning/doffing socks    Time 4    Period Weeks    Status New      OT SHORT TERM GOAL #4   Title Patient will prepare simple warm meal for himself with no more than supervision    Time 4    Period Weeks    Status New               OT Long Term Goals - 04/13/21 1702       OT LONG TERM GOAL #1   Title Patient will complete updated HEP designed to increase range of motion, strength and functional use of non-dominant LUE    Time 8    Period Weeks    Status New    Target Date 06/27/21      OT LONG TERM GOAL #2   Title Patient will return to mowing 50% of his lawn with minimal assistance    Time 8    Period Weeks    Status New      OT LONG TERM GOAL #3   Title Patient will return to working with hand (versus power)  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    Time 8    Period Weeks    Status New      OT LONG TERM GOAL #4   Title Patient will report  decrease in pain with reaching patterns with LUE through mid to high reach with pain no more than 1-2/10.    Time 8    Period Weeks    Status New      OT LONG TERM GOAL #5   Title Patient will demonstrate 5 lb increase in left grip strength    Time 8    Period Weeks    Status New      Long Term Additional Goals   Additional Long Term Goals Yes      OT LONG TERM GOAL #6   Title Patient will complete FOTO survey on discharge with at least 10 point improvement (55 UE on eval)    Time 8    Period Weeks  Status New                   Plan - 04/13/21 1342     Clinical Impression Statement Patient is a 65 yr old male referred to OP OT s/p R Basal Ganglia Infarct.  Patient admitted on 03/12/21, [facility] and home on 9/24.  Patient lives with his son, son's girlfriend and their 24 yr old son.  Patient presents to OT eval walking without device, but unsteady with transitions - sit to stand, stand balance.  Patient reports decreased motivation, decreased balance,a nd decreased independence with IADL.  Patient also displays significant left shoulder pain with attempt at mid to high level reach.  Patient reports PMH significant for anxiety, crush injury leg, back problems, now with A fib, HTN.  Patient's record indicates EtOH abuse - patient has been directed to avoid drinking, smoking, driving, and heavy work.  Patient is currently on disability.  Patient may benefit from skilled OT intervention to address functional use of LUE especially with mid to high level reach, pain reduction, and to address dynamic balance activities as related to ADL/IADL.    OT Occupational Profile and History Detailed Assessment- Review of Records and additional review of physical, cognitive, psychosocial history related to current functional performance    Occupational performance deficits (Please refer to evaluation for details): ADL's;IADL's;Leisure    Body Structure /  Function / Physical Skills ADL;Decreased knowledge of use of DME;Strength;Balance;Dexterity;GMC;Pain;Tone;Body mechanics;Hearing;UE functional use;Cardiopulmonary status limiting activity;Endurance;IADL;ROM;Vestibular;Coordination;Flexibility;Mobility;Sensation;FMC;Decreased knowledge of precautions    Rehab Potential Good    Clinical Decision Making Several treatment options, min-mod task modification necessary    Comorbidities Affecting Occupational Performance: May have comorbidities impacting occupational performance    Modification or Assistance to Complete Evaluation  Min-Moderate modification of tasks or assist with assess necessary to complete eval    OT Frequency 2x / week    OT Duration 8 weeks    OT Treatment/Interventions Self-care/ADL training;Moist Heat;Fluidtherapy;DME and/or AE instruction;Balance training;Aquatic Therapy;Therapeutic activities;Ultrasound;Therapeutic exercise;Cognitive remediation/compensation;Neuromuscular education;Functional Mobility Training;Passive range of motion;Visual/perceptual remediation/compensation;Electrical Stimulation;Manual Therapy;Patient/family education    Plan Gentle range to left shoulder passive to active assisted - supine.  Needs HEP.  Challenge stand balance    Consulted and Agree with Plan of Care Patient;Family member/caregiver             Patient will benefit from skilled therapeutic intervention in order to improve the following deficits and impairments:   Body Structure / Function / Physical Skills: ADL, Decreased knowledge of use of DME, Strength, Balance, Dexterity, GMC, Pain, Tone, Body mechanics, Hearing, UE functional use, Cardiopulmonary status limiting activity, Endurance, IADL, ROM, Vestibular, Coordination, Flexibility, Mobility, Sensation, FMC, Decreased knowledge of precautions       Visit Diagnosis: Stiffness of left shoulder, not elsewhere classified  Acute pain of left shoulder  Unsteadiness on feet  Attention  and concentration deficit  Muscle weakness (generalized)    Problem List Patient Active Problem List   Diagnosis Date Noted   Dysphagia    Leucocytosis    Benign essential HTN    Acute gout of right knee    Acute ischemic right MCA stroke (HCC) 03/12/2021   Alcohol abuse    Seizure (HCC)    Essential hypertension 12/26/2019   Longstanding persistent atrial fibrillation (HCC)    Atrial fibrillation with RVR (HCC) 02/03/2019   Hyperlipidemia 02/03/2019   Anxiety 02/03/2019    Collier Salina, OT/L 04/13/2021, 5:10 PM  [facility] [address] [place], [place], [address] Phone: [phone]   Fax:  [phone]  Name: Patrick Padilla MRN: [id number] Date of Birth: 1955-08-30

## 2021-04-13 NOTE — Therapy (Signed)
[facility] [address] [place], [place], [address] Phone: [phone]   Fax:  [phone]  Speech Language Pathology Evaluation  Patient Details  Name: Patrick Padilla MRN: [id number] Date of Birth: 1956-07-05 Referring Provider (SLP): Charlton Amor, PA-C   Encounter Date: 04/13/2021   End of Session - 04/13/21 1147     Visit Number 1    Number of Visits 17    Date for SLP Re-Evaluation 06/10/21    SLP Start Time 1025   pt arrived late   SLP Stop Time  1102    SLP Time Calculation (min) 37 min    Activity Tolerance Patient tolerated treatment well             Past Medical History:  Diagnosis Date   Anxiety    Elevated LFTs    History of colon polyps    HLD (hyperlipidemia)    HTN (hypertension)    Localized superficial swelling, mass, or lump     Past Surgical History:  Procedure Laterality Date   CARDIOVERSION N/A 02/05/2019   Procedure: CARDIOVERSION;  Surgeon: Wendall Stade, MD;  Location: [facility];  Service: Cardiovascular;  Laterality: N/A;   CERVICAL DISC SURGERY     IR CT HEAD LTD  03/12/2021   IR PERCUTANEOUS ART THROMBECTOMY/INFUSION INTRACRANIAL INC DIAG ANGIO  03/12/2021   IR US GUIDE VASC ACCESS RIGHT  03/15/2021   LEG SURGERY     right leg and ankle   NECK SURGERY     cyst removed   RADIOLOGY WITH ANESTHESIA N/A 03/12/2021   Procedure: RADIOLOGY WITH ANESTHESIA;  Surgeon: Radiologist, Medication, MD;  Location: [facility] OR;  Service: Radiology;  Laterality: N/A;   TEE WITHOUT CARDIOVERSION N/A 02/05/2019   Procedure: TRANSESOPHAGEAL ECHOCARDIOGRAM (TEE);  Surgeon: Wendall Stade, MD;  [facility] ENDOSCOPY;  Service: Cardiovascular;  Laterality: N/A;   TOTAL HIP ARTHROPLASTY     left    There were no vitals filed for this visit.       SLP Evaluation [facility] - 04/13/21 1024       SLP Visit Information   SLP Received On 03/30/21    Referring Provider (SLP) Charlton Amor, PA-C     Onset Date 03-12-21    Medical Diagnosis Cerebral infarction due to unspecified occlusion or stenosis of right middle cerebral artery      Subjective   Subjective "pretty much normal"    Patient/Family Stated Goal to increase independence and safety      Pain Assessment   Currently in Pain? Yes    Pain Score 4     Pain Location Back   hips, left shoulder     General Information   HPI Patient is a 65 year old RH-male with history of A fib s/p CV in the past, ETOH abuse who activated EMS  on 03/12/21 with reports of confusion, CP and diaphoresis, was found to be in A fib with RVR and CV attempts enroute to ED. He was hemiplegic with RUE jerking moments, was obtundent with hypoxic respiratory failure requiring intubation in ED. CTP showed R-MCA infarct. He underwent emergent cerebral angio with thrombectomy  R-M1/MCA occlusion with complete recanalization. LTM-EEG showed diffuse encephalopathy without seizure or epileptiform discharges and he tolerated extubation by 09/05. Pt had intermittent bouts of agitation felt to be due to ETOH withdrawal. Follow up MRI head showed acute infarct in right basal ganglia, few punctate bifrontal peripheral infarcts and recent left thalamic infarct question subacute  as well as PRES appearance in bilateral parieto-occipital cortex and small volume blood products at right sylvian fissure.    Behavioral/Cognition hard of hearing      Balance Screen   Has the patient fallen in the past 6 months Yes    How many times? "during my stroke I did"      Prior Functional Status   Cognitive/Linguistic Baseline Within functional limits    Type of Home House     Lives With Son    Available Support Family    Education HS    Vocation On disability   own business     Cognition   Overall Cognitive Status Impaired/Different from baseline    Area of Impairment Attention;Memory;Safety/judgement;Awareness;Problem solving    Current Attention Level Focused    Attention Comments  noted to become distracted during conversation    Memory Decreased short-term memory    Memory Comments reduced recall related to functional concerns at home; reduced recall on story retell    Safety/Judgement Decreased awareness of safety;Decreased awareness of deficits    Safety and Judgement Comments Pt reported "poor decision making"    Awareness Intellectual;Anticipatory    Awareness Comments limited awareness of functional decline and suspect reduced awareness to anticipate possible errors    Problem Solving Slow processing;Decreased initiation    Problem Solving Comments son reports minimal initation to complete tasks; benefits from additional time to process      Auditory Comprehension   Overall Auditory Comprehension Appears within functional limits for tasks assessed      Verbal Expression   Overall Verbal Expression Appears within functional limits for tasks assessed      Oral Motor/Sensory Function   Overall Oral Motor/Sensory Function Appears within functional limits for tasks assessed      Motor Speech   Overall Motor Speech Appears within functional limits for tasks assessed      Standardized Assessments   Standardized Assessments  Cognitive Linguistic Quick Test   initiated; to be completed next session                            SLP Education - 04/13/21 1146     Education Details eval results, possible goals, memory compensatoins    Person(s) Educated Patient;Child(ren)    Methods Explanation;Demonstration;Handout    Comprehension Verbalized understanding;Returned demonstration;Need further instruction              SLP Short Term Goals - 04/13/21 1148       SLP SHORT TERM GOAL #1   Title Pt will use calendar to identify and manage appointments given occasional mod A over 3 sessions    Time 4    Period Weeks    Status New    Target Date 05/13/21      SLP SHORT TERM GOAL #2   Title Pt will recall to take medications with use of  external aids given occasional min A over 3 sessions    Time 4    Period Weeks    Status New    Target Date 05/13/21      SLP SHORT TERM GOAL #3   Title Pt will problem solve possible or encountered household scenarios to optimize completion of daily tasks given occasional min A over 2 sessions    Time 4    Period Weeks    Status New    Target Date 05/13/21      SLP SHORT TERM GOAL #4  Title Pt will initiate completion of 1-2 household tasks per day given occasional mod A from family over 2 sessions    Time 4    Period Weeks    Status New    Target Date 05/13/21      SLP SHORT TERM GOAL #5   Title Pt/caregiver will complete cognitive PROM in first few ST sessions    Time 4    Period Weeks    Status New    Target Date 05/13/21              SLP Long Term Goals - 04/13/21 1336       SLP LONG TERM GOAL #1   Title Pt will use memory compensations for managment of appointments, medications, and daily tasks given occasional min A over 2 sessions    Time 8    Period Weeks    Status New    Target Date 06/10/21      SLP LONG TERM GOAL #2   Title Pt will verbalize and demonstrate appropriate problem solving to improve completion of household tasks given rare min A over 2 sessions    Time 8    Period Weeks    Status New    Target Date 06/10/21      SLP LONG TERM GOAL #3   Title Pt/caregiver will report less reliance on family to complete previously independent household tasks given occasional min A over 2 sessions    Time 8    Period Weeks    Status New    Target Date 06/10/21              Plan - 04/13/21 1147     Clinical Impression Statement "Patrick Padilla" was referred for OPST secondary to CVA in September 2022. Pt was accompanied by his son, Patrick Hua today. Patrick Padilla was previously independent prior to stroke. He is currently living with Patrick Hua and his fiance. Patrick Padilla reported no cognitive challenges since returning home; however, with further prompting, it was revealed  pt's son is now handling bills, medications, and paperwork. Pt is now reliant on family to accurately and consistently complete these tasks resulting in caregiver burden. Pt's son questioned patient's motivation as pt can be "stubborn" and difficult to engage. Family now provides constant cues to initiate and complete household tasks (ex: medicines left beside him will not be taken unless verbally prompted). Cognitive Linguistic Quick Test (CLQT) initiated this session and will be completed next session due to time constraints. For symbol cancellation, pt identified 3 missed symbols when he double checked. For story retell, pt able to immediately recall 6/18 details from story. Some difficulty reported with mental flexability as pt continued to think of captial M words during generative naming. Following CLQT review, pt reports he is experiencing some change in cognition, including forgetting why he entered a room and some occasional poor decision making. Skilled ST is recommended to address cognitive linguistic skills to maximize pt's functional independence and safety.    Speech Therapy Frequency 2x / week    Duration 8 weeks    Treatment/Interventions Compensatory strategies;Functional tasks;Patient/family education;Language facilitation;Compensatory techniques;Internal/external aids;SLP instruction and feedback;Cognitive reorganization;Environmental controls    Potential to Achieve Goals Fair    Potential Considerations Cooperation/participation level;Previous level of function    SLP Home Exercise Plan provided    Consulted and Agree with Plan of Care Patient;Family member/caregiver             Patient will benefit from skilled therapeutic intervention in order to  improve the following deficits and impairments:   Cognitive communication deficit    Problem List Patient Active Problem List   Diagnosis Date Noted   Dysphagia    Leucocytosis    Benign essential HTN    Acute gout of right  knee    Acute ischemic right MCA stroke (HCC) 03/12/2021   Alcohol abuse    Seizure (HCC)    Essential hypertension 12/26/2019   Longstanding persistent atrial fibrillation (HCC)    Atrial fibrillation with RVR (HCC) 02/03/2019   Hyperlipidemia 02/03/2019   Anxiety 02/03/2019    Janann Colonel, MA CCC-SLP 04/13/2021, 1:52 PM  [facility] [address] [place], [place], [address] Phone: [phone]   Fax:  [phone]  Name: Patrick Padilla MRN: [id number] Date of Birth: [birth year]

## 2021-04-13 NOTE — Progress Notes (Signed)
Subjective:    Patient ID: Patrick Padilla, male    DOB: 1956/02/04, 65 y.o.   MRN: [id number]  HPI: Patrick Padilla is a 65 y.o. male who is here for HFU appointment for follow up of his Acute Ischemic Right MCA Stroke, Atrial Fibrillation with RVR and Essential Hypertension.   He was broght to [facility] via EMS:  DR Selina Cooley H&P Note 65 yo patient with hx EtOH abuse and a fib (unclear if on [facility]) who presented to ED with L hemiplegia and R sided jerking after attempted cardioversion by EMS for a fib with RVR. Patient inubated on my examination and unable to provide history. Family is unreachable. Hx is second-hand from EMS via EDP. Per this report, patient last seen well when he went to bed at 10pm last night. Woke up this morning confused. Earlier this afternoon developed diaphoresis and chest discomfort. Called EMS who stated patient was in a fib with RVR. Attempted cardioversion en route. On arrival to ED per EDP patient was hemiplegic on the left with rhythmic jerking RUE. He was intubated with rocuronium for acute hypoxic respiratory failure. EDP contacted neurology and I activated a stroke code while he was in the CT scan. He remained paralyzed and so I could not perform a neurologic exam or NIHSS. Outside window for TNK. CTH possible subtle loss of gray-white differentiation in R lentiform nucleus and bilateral thalami ASPECTS 9/10. CTA showed R M1 occlusion. CTP showed R MCA infarct core 83ml with penumbra of .    At that point we were still unable to contact family therefore after discussion with neurointerventionalist I activated code IR under emergency protocol. I attempted contacting wife at all numbers listed in chart multiple times and was unable to reach. ED received a message to call [phone] to update son Onalee Hua but when I called the number he was not home and his fiance stated she had no way to get in touch with him until he got home from the store.    Patient taken to IR under  emergency protocol for emergent thrombectomy for R M1 occlusion.   CT: IMPRESSION: 1. Emergent large vessel occlusion of the right M1 segment with poor collaterals. 2. Subtle loss of gray-white differentiation in the right lentiform nucleus and bilateral thalami compatible with acute infarct. ASPECTS score 9/10 3. No acute hemorrhage. 4. Minimal atherosclerotic changes at the carotid bifurcations bilaterally without significant stenosis. 5. Atherosclerotic changes within the cavernous internal carotid arteries bilaterally without significant stenosis. 6. Aortic Atherosclerosis (ICD10-I70.0).   CT Cerebral Perfusion:  IMPRESSION: 1. Right MCA territory infarct with a core infarct of 11 mL and surrounding penumbra of 164 mL. 2. Core infarct is in the right lentiform nucleus.  CT Head :  IMPRESSION: Small amount of subarachnoid hyperdensity at the right frontal operculum, likely contrast staining. No acute hemorrhage.    Dr Roda Shutters: Discharge Note:  Stroke - Right MCA Stroke due to proximal right M1/MCA occlusion s/p IR with TICI3, embolic, likely due to afib s/p cardioversion.   Patrick Padilla was admitted to inpatient rehabilitation on 03/17/2021 and discharged home on 04/02/2021. He will be  receiving outpatient Therapy at [facility].  He states he has pain in his left shoulder and lower back pain. He rates his pain 4. He also reports he has a good appetite.   Son: Onalee Hua in room all questions answered.      Pain Inventory Average Pain 4 Pain Right Now 4 My pain is constant, tingling,  and aching  LOCATION OF PAIN  Left Shoulder, Back  BOWEL Number of stools per week: 3 Oral laxative use No   BLADDER Normal  Mobility walk without assistance ability to climb steps?  yes do you drive?  no  Function disabled: date disabled Maybe 5 years I need assistance with the following:  household duties Do you have any goals in this area?   yes  Neuro/Psych spasms  Prior Studies Any changes since last visit?  no, Hospital Follow up   Physicians involved in your care Any changes since last visit?  no   Family History  Problem Relation Age of Onset   Hepatitis Mother    Clotting disorder Mother    Stroke Mother    Thyroid disease Mother    Colon cancer Father        ?   Rheumatic fever Brother    Heart disease Brother    Social History   Socioeconomic History   Marital status: Married    Spouse name: Not on file   Number of children: 2   Years of education: Not on file   Highest education level: Not on file  Occupational History   Occupation: disabled    Employer: SELF-EMPLOYED  Tobacco Use   Smoking status: Every Day   Smokeless tobacco: Never  Vaping Use   Vaping Use: Never used  Substance and Sexual Activity   Alcohol use: Yes    Comment: 3-4 a day   Drug use: No   Sexual activity: Not on file  Other Topics Concern   Not on file  Social History Narrative   Not on file   Social Determinants of Health   Financial Resource Strain: Not on file  Food Insecurity: Not on file  Transportation Needs: Not on file  Physical Activity: Not on file  Stress: Not on file  Social Connections: Not on file   Past Surgical History:  Procedure Laterality Date   CARDIOVERSION N/A 02/05/2019   Procedure: CARDIOVERSION;  Surgeon: Wendall Stade, MD;  [facility];  Service: Cardiovascular;  Laterality: N/A;   CERVICAL DISC SURGERY     IR CT HEAD LTD  03/12/2021   IR PERCUTANEOUS ART THROMBECTOMY/INFUSION INTRACRANIAL INC DIAG ANGIO  03/12/2021   IR US GUIDE VASC ACCESS RIGHT  03/15/2021   LEG SURGERY     right leg and ankle   NECK SURGERY     cyst removed   RADIOLOGY WITH ANESTHESIA N/A 03/12/2021   Procedure: RADIOLOGY WITH ANESTHESIA;  Surgeon: Radiologist, Medication, MD;  Location: [facility] OR;  Service: Radiology;  Laterality: N/A;   TEE WITHOUT CARDIOVERSION N/A 02/05/2019   Procedure: TRANSESOPHAGEAL  ECHOCARDIOGRAM (TEE);  Surgeon: Wendall Stade, MD;  Location: [facility] ENDOSCOPY;  Service: Cardiovascular;  Laterality: N/A;   TOTAL HIP ARTHROPLASTY     left   Past Medical History:  Diagnosis Date   Anxiety    Elevated LFTs    History of colon polyps    HLD (hyperlipidemia)    HTN (hypertension)    Localized superficial swelling, mass, or lump    BP 117/67   Pulse 68   Temp 97.9 F (36.6 C)   Ht 6' (1.829 m)   Wt 207 lb (93.9 kg)   SpO2 98%   BMI 28.07 kg/m   Opioid Risk Score:   Fall Risk Score:  `1  Depression screen PHQ 2/9  Depression screen PHQ 2/9 04/13/2021  Decreased Interest 2  Down, Depressed, Hopeless 1  PHQ -  2 Score 3  Altered sleeping 3  Tired, decreased energy 2  Change in appetite 2  Feeling bad or failure about yourself  0  Trouble concentrating 1  Moving slowly or fidgety/restless 1  Suicidal thoughts 0  PHQ-9 Score 12    Review of Systems  Constitutional:  Negative for appetite change.  Respiratory:  Positive for shortness of breath and wheezing.   Musculoskeletal:        Spasms, shoulder pain  All other systems reviewed and are negative.     Objective:   Physical Exam Vitals and nursing note reviewed.  Constitutional:      Appearance: Normal appearance.  Cardiovascular:     Rate and Rhythm: Normal rate and regular rhythm.     Pulses: Normal pulses.     Heart sounds: Normal heart sounds.  Pulmonary:     Effort: Pulmonary effort is normal.     Breath sounds: Normal breath sounds.  Musculoskeletal:     Cervical back: Normal range of motion and neck supple.     Comments: Normal Muscle Bulk and Muscle Testing Reveals:  Upper Extremities: Right Full ROM and Muscle Strength  5/5 Left Upper Extremity: Decreased ROM 45 Degrees and Muscle Strength 4/5  Lower Extremities: Full ROM and Muscle Strength 5/5 Arises from Table with ease Narrow Based  Gait     Skin:    General: Skin is warm and dry.  Neurological:     Mental Status: He is  alert and oriented to person, place, and time.  Psychiatric:        Mood and Affect: Mood normal.        Behavior: Behavior normal.         Assessment & Plan:  Acute Ischemic Right MCA Stroke: Continue Outpatient Therapy at Neuro Rehabilitation. He has a scheduled appointment with Neurology. Continue current medication regimen. Continue to monitor.  2. Atrial Fibrillation with RVR : Continue Current Medication Regimen . Cardiology Following. Continue to Monitor. 3.Essential Hypertension.: Continue current medication Regimen . He has a PCP appointment on 05/16/2021, Continue to Monitor.   F/U with Dr Riley Kill  in 4- 6 weeks

## 2021-04-13 NOTE — Patient Instructions (Signed)

## 2021-04-13 NOTE — Therapy (Signed)
[facility] [address] [place], [place], [address] Phone: [phone]   Fax:  [phone]  Physical Therapy Evaluation  Patient Details  Name: Patrick Padilla MRN: [id number] Date of Birth: May 08, 1956 No data recorded  Encounter Date: 04/13/2021   PT End of Session - 04/13/21 1430     Visit Number 1    Number of Visits 13    Date for PT Re-Evaluation 06/01/21    Authorization Type MCR/MCD    Progress Note Due on Visit 10    PT Start Time 1315    PT Stop Time 1400    PT Time Calculation (min) 45 min    Equipment Utilized During Treatment Gait belt    Activity Tolerance Patient tolerated treatment well    Behavior During Therapy WFL for tasks assessed/performed             Past Medical History:  Diagnosis Date   Anxiety    Elevated LFTs    History of colon polyps    HLD (hyperlipidemia)    HTN (hypertension)    Localized superficial swelling, mass, or lump     Past Surgical History:  Procedure Laterality Date   CARDIOVERSION N/A 02/05/2019   Procedure: CARDIOVERSION;  Surgeon: Wendall Stade, MD;  Location: [facility] ENDOSCOPY;  Service: Cardiovascular;  Laterality: N/A;   CERVICAL DISC SURGERY     IR CT HEAD LTD  03/12/2021   IR PERCUTANEOUS ART THROMBECTOMY/INFUSION INTRACRANIAL INC DIAG ANGIO  03/12/2021   IR US GUIDE VASC ACCESS RIGHT  03/15/2021   LEG SURGERY     right leg and ankle   NECK SURGERY     cyst removed   RADIOLOGY WITH ANESTHESIA N/A 03/12/2021   Procedure: RADIOLOGY WITH ANESTHESIA;  Surgeon: Radiologist, Medication, MD;  Location: [facility] OR;  Service: Radiology;  Laterality: N/A;   TEE WITHOUT CARDIOVERSION N/A 02/05/2019   Procedure: TRANSESOPHAGEAL ECHOCARDIOGRAM (TEE);  Surgeon: Wendall Stade, MD;  Location: [facility] ENDOSCOPY;  Service: Cardiovascular;  Laterality: N/A;   TOTAL HIP ARTHROPLASTY     left    There were no vitals filed for this visit.    Subjective Assessment - 04/13/21 1318      Subjective Had a brainstem CVA and reports he has lost LE strength and balance and does not feel steady on his feet.  Had issues with not being able to pick up his feet prior to infarct.    Patient is accompained by: Family member    Pertinent History MRI+acute infarct at the right basal ganglia. Few and  punctate bifrontal peripheral infarcts. Recent medial left thalamic infarct which may be subacute. 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    Limitations Standing;Walking    How long can you sit comfortably? .15 min    How long can you stand comfortably? <15 min    How long can you walk comfortably? <15 min    Patient Stated Goals To become more steady on his feet and return to outside ambulation    Currently in Pain? No/denies                [facility] PT Assessment - 04/13/21 1324       Assessment   Medical Diagnosis infarct R BG    Hand Dominance Right    Next MD Visit 05/16/21    Prior Therapy CIR      Precautions   Precaution Comments No driving, no smoking, no drinking, no heavy lifting  Balance Screen   Has the patient fallen in the past 6 months No    Has the patient had a decrease in activity level because of a fear of falling?  No    Is the patient reluctant to leave their home because of a fear of falling?  No      Prior Function   Level of Independence Independent with gait    Vocation On disability      Sensation   Light Touch Appears Intact      Coordination   Gross Motor Movements are Fluid and Coordinated Yes      ROM / Strength   AROM / PROM / Strength Strength      Strength   Overall Strength Within functional limits for tasks performed    Overall Strength Comments BLE strength grossly WFL      Transfers   Transfers Sit to Stand;Stand to Sit    Sit to Stand 5: Supervision    Five time sit to stand comments  15.4    Stand to Sit 5: Supervision    Comments needs UE support       Ambulation/Gait   Ambulation/Gait Yes    Ambulation/Gait Assistance 5: Supervision    Ambulation Distance (Feet) 200 Feet    Assistive device None    Gait Pattern Step-through pattern;Decreased arm swing - right;Decreased arm swing - left;Decreased step length - right;Decreased step length - left;Decreased dorsiflexion - right;Decreased dorsiflexion - left;Lateral hip instability    Ambulation Surface Level;Indoor    Stairs Yes    Stairs Assistance 5: Supervision;4: Min guard    Stair Management Technique Two rails;Alternating pattern    Number of Stairs 8    Height of Stairs 6      Functional Gait  Assessment   Gait assessed  Yes    Gait Level Surface Walks 20 ft in less than 7 sec but greater than 5.5 sec, uses assistive device, slower speed, mild gait deviations, or deviates 6-10 in outside of the 12 in walkway width.    Change in Gait Speed Able to change speed, demonstrates mild gait deviations, deviates 6-10 in outside of the 12 in walkway width, or no gait deviations, unable to achieve a major change in velocity, or uses a change in velocity, or uses an assistive device.    Gait with Horizontal Head Turns Performs head turns smoothly with no change in gait. Deviates no more than 6 in outside 12 in walkway width    Gait with Vertical Head Turns Performs head turns with no change in gait. Deviates no more than 6 in outside 12 in walkway width.    Gait and Pivot Turn Pivot turns safely within 3 sec and stops quickly with no loss of balance.    Step Over Obstacle Is able to step over one shoe box (4.5 in total height) but must slow down and adjust steps to clear box safely. May require verbal cueing.    Gait with Narrow Base of Support Ambulates less than 4 steps heel to toe or cannot perform without assistance.    Gait with Eyes Closed Walks 20 ft, slow speed, abnormal gait pattern, evidence for imbalance, deviates 10-15 in outside 12 in walkway width. Requires more than 9 sec to ambulate  20 ft.    Ambulating Backwards Walks 20 ft, no assistive devices, good speed, no evidence for imbalance, normal gait    Steps Alternating feet, must use rail.    Total Score 20  Objective measurements completed on examination: See above findings.                  PT Short Term Goals - 04/13/21 1435       PT SHORT TERM GOAL #1   Title Patient to become Ind in initial HEP    Baseline [id number]    Time 3    Period Weeks    Status New    Target Date 05/11/21      PT SHORT TERM GOAL #2   Title Patient to negotiate 16 steps with most appropriate pattern and support    Baseline 8 steps with alternating pattern and B rails    Time 3    Period Weeks    Status New    Target Date 05/11/21      PT SHORT TERM GOAL #3   Title Patient to transfer STS 1x w/o UE support    Baseline Requires BUE support to stand    Time 3    Period Weeks    Status New    Target Date 05/11/21      PT SHORT TERM GOAL #4   Title Patient to ambulate 531ft across outdoor surfaces w/o need of AD    Baseline 258ft indoors w/o need of AD    Time 3    Period Weeks    Status New    Target Date 06/01/21               PT Long Term Goals - 04/13/21 1440       PT LONG TERM GOAL #1   Title Patient to decreased 5x STS time to <12s w/o UE assist    Baseline 15.4s with BUE assist    Time 6    Period Weeks    Status New    Target Date 06/01/21      PT LONG TERM GOAL #2   Title Patient to increase score of FGA to 26/30    Baseline 20/30    Time 6    Period Weeks    Status New    Target Date 06/01/21      PT LONG TERM GOAL #3   Title Patient to negotiate 16 steps with alternting pattern w/o UE support    Baseline 8 steps with alternating pattern and B UE support    Time 6    Period Weeks    Target Date 06/01/21      PT LONG TERM GOAL #4   Title Ambulate 1078ft across outdoor surfaces w/o need of AD    Baseline 244ft in clinic w/o AD    Time 6     Period Weeks    Status New    Target Date 06/01/21                     Patient will benefit from skilled therapeutic intervention in order to improve the following deficits and impairments:     Visit Diagnosis: Unsteadiness on feet  Balance disorder  Muscle weakness (generalized)     Problem List Patient Active Problem List   Diagnosis Date Noted   Dysphagia    Leucocytosis    Benign essential HTN    Acute gout of right knee    Acute ischemic right MCA stroke (HCC) 03/12/2021   Alcohol abuse    Seizure (HCC)    Essential hypertension 12/26/2019   Longstanding persistent atrial fibrillation (HCC)    Atrial fibrillation with RVR (HCC) 02/03/2019  Hyperlipidemia 02/03/2019   Anxiety 02/03/2019    Hildred Laser, PT 04/13/2021, 2:53 PM  [facility] [place] [address] [place], [place], [address] Phone: [phone]   Fax:  [phone]  Name: Kylor Valverde MRN: [id number] Date of Birth: 1956/05/24

## 2021-04-20 ENCOUNTER — Other Ambulatory Visit: Payer: Self-pay

## 2021-04-21 ENCOUNTER — Telehealth: Payer: Self-pay

## 2021-04-21 NOTE — Telephone Encounter (Signed)
Fredric Mare called on behalf of patient stating the patient is not able to get his Eliquis until his Medicare starts on 05/10/21. She stated she was given a few resources, but wants to know if the patient can take aspirin until 05/10/21 if the resources can not help get the Eliquis. Please advise.

## 2021-04-21 NOTE — Telephone Encounter (Signed)
Return Ms. Patrick Padilla call. [name] was sent to [facility] on 04/13/2021.  [name] was receiving samples from his Cardiologist , Patrick Padilla was instructed to call [facility] if she has a problem to give [place] a return call, she verbalizes understanding.

## 2021-04-25 ENCOUNTER — Other Ambulatory Visit: Payer: Self-pay

## 2021-04-25 ENCOUNTER — Ambulatory Visit: Payer: Medicare Other

## 2021-04-25 DIAGNOSIS — R2681 Unsteadiness on feet: Secondary | ICD-10-CM

## 2021-04-25 DIAGNOSIS — M25612 Stiffness of left shoulder, not elsewhere classified: Secondary | ICD-10-CM | POA: Diagnosis not present

## 2021-04-25 DIAGNOSIS — M25512 Pain in left shoulder: Secondary | ICD-10-CM | POA: Diagnosis not present

## 2021-04-25 DIAGNOSIS — M6281 Muscle weakness (generalized): Secondary | ICD-10-CM | POA: Diagnosis not present

## 2021-04-25 DIAGNOSIS — R2689 Other abnormalities of gait and mobility: Secondary | ICD-10-CM | POA: Diagnosis not present

## 2021-04-25 DIAGNOSIS — R41840 Attention and concentration deficit: Secondary | ICD-10-CM | POA: Diagnosis not present

## 2021-04-25 NOTE — Therapy (Signed)
[facility] [address] [place], [place], [address] Phone: [phone]   Fax:  [phone]  Physical Therapy Treatment  Patient Details  Name: Patrick Padilla MRN: [id number] Date of Birth: September 17, 1955 No data recorded  Encounter Date: 04/25/2021   PT End of Session - 04/25/21 1318     Visit Number 2    Number of Visits 13    Date for PT Re-Evaluation 06/01/21    Authorization Type MCR/MCD    Progress Note Due on Visit 10    PT Start Time 1315    PT Stop Time 1400    PT Time Calculation (min) 45 min    Equipment Utilized During Treatment Gait belt    Activity Tolerance Patient tolerated treatment well    Behavior During Therapy WFL for tasks assessed/performed             Past Medical History:  Diagnosis Date   Anxiety    Elevated LFTs    History of colon polyps    HLD (hyperlipidemia)    HTN (hypertension)    Localized superficial swelling, mass, or lump     Past Surgical History:  Procedure Laterality Date   CARDIOVERSION N/A 02/05/2019   Procedure: CARDIOVERSION;  Surgeon: Wendall Stade, MD;  Location: [facility] ENDOSCOPY;  Service: Cardiovascular;  Laterality: N/A;   CERVICAL DISC SURGERY     IR CT HEAD LTD  03/12/2021   IR PERCUTANEOUS ART THROMBECTOMY/INFUSION INTRACRANIAL INC DIAG ANGIO  03/12/2021   IR US GUIDE VASC ACCESS RIGHT  03/15/2021   LEG SURGERY     right leg and ankle   NECK SURGERY     cyst removed   RADIOLOGY WITH ANESTHESIA N/A 03/12/2021   Procedure: RADIOLOGY WITH ANESTHESIA;  Surgeon: Radiologist, Medication, MD;  Location: [facility] OR;  Service: Radiology;  Laterality: N/A;   TEE WITHOUT CARDIOVERSION N/A 02/05/2019   Procedure: TRANSESOPHAGEAL ECHOCARDIOGRAM (TEE);  Surgeon: Wendall Stade, MD;  Location: [facility] ENDOSCOPY;  Service: Cardiovascular;  Laterality: N/A;   TOTAL HIP ARTHROPLASTY     left    There were no vitals filed for this visit.   Subjective Assessment - 04/25/21 1322      Subjective reporting continued low back, B shoulder and neck pain since he was in the hospital, has prevented him from performing his HEP    Patient is accompained by: Family member    Pertinent History MRI+acute infarct at the right basal ganglia. Few and  punctate bifrontal peripheral infarcts. Recent medial left thalamic infarct which may be subacute. 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    Limitations Standing;Walking    How long can you sit comfortably? .15 min    How long can you stand comfortably? <15 min    How long can you walk comfortably? <15 min    Patient Stated Goals To become more steady on his feet and return to outside ambulation    Pain Onset 1 to 4 weeks ago                               [facility] Adult PT Treatment/Exercise - 04/25/21 0001       Transfers   Transfers Sit to Stand;Stand to Sit    Sit to Stand 6: Modified independent (Device/Increase time)    Stand to Sit 6: Modified independent (Device/Increase time)      Ambulation/Gait   Stairs Yes  Stairs Assistance 5: Supervision    Stair Management Technique Two rails;Alternating pattern    Number of Stairs 16    Height of Stairs 6    Gait Comments 99 O2, 69 HR pre; 100 O2, 88 HR      Knee/Hip Exercises: Aerobic   Stepper Scifit seat 18 arms 7 L1 x8 min      Knee/Hip Exercises: Standing   Heel Raises Both;1 set;15 reps;Limitations    Heel Raises Limitations BUE support    Hip Abduction Both;1 set;15 reps;Knee straight    Abduction Limitations BUE support    Other Standing Knee Exercises marching with UE support 15x ea. alt      Knee/Hip Exercises: Seated   Long Arc Quad Strengthening;Both;1 set;15 reps;Limitations    Long Arc Quad Limitations with lat press    Marching Strengthening;Both;1 set;15 reps;Limitations    Marching Limitations with lat press      Ankle Exercises: Standing   Heel Raises Both;15 reps;Limitations     Heel Raises Limitations BUE support    Toe Raise 15 reps;Limitations    Toe Raise Limitations BUE support                       PT Short Term Goals - 04/25/21 1341       PT SHORT TERM GOAL #1   Title Patient to become Ind in initial HEP    Baseline [id number]    Time 3    Period Weeks    Status New    Target Date 05/11/21      PT SHORT TERM GOAL #2   Title Patient to negotiate 16 steps with most appropriate pattern and support    Baseline 8 steps with alternating pattern and B rails; 04/25/21 16 steps with single rail and step through pattern    Time 3    Period Weeks    Status Achieved    Target Date 05/11/21      PT SHORT TERM GOAL #3   Title Patient to transfer STS 1x w/o UE support    Baseline Requires BUE support to stand; 04/25/21 Able to stand once arms crossed followed by 4 more with CGA    Time 3    Period Weeks    Status Achieved    Target Date 05/11/21      PT SHORT TERM GOAL #4   Title Patient to ambulate 546ft across outdoor surfaces w/o need of AD    Baseline 290ft indoors w/o need of AD    Time 3    Period Weeks    Status New    Target Date 06/01/21               PT Long Term Goals - 04/13/21 1440       PT LONG TERM GOAL #1   Title Patient to decreased 5x STS time to <12s w/o UE assist    Baseline 15.4s with BUE assist    Time 6    Period Weeks    Status New    Target Date 06/01/21      PT LONG TERM GOAL #2   Title Patient to increase score of FGA to 26/30    Baseline 20/30    Time 6    Period Weeks    Status New    Target Date 06/01/21      PT LONG TERM GOAL #3   Title Patient to negotiate 16 steps with alternting pattern w/o UE support  Baseline 8 steps with alternating pattern and B UE support    Time 6    Period Weeks    Target Date 06/01/21      PT LONG TERM GOAL #4   Title Ambulate 1040ft across outdoor surfaces w/o need of AD    Baseline 249ft in clinic w/o AD    Time 6    Period Weeks    Status New     Target Date 06/01/21      PT LONG TERM GOAL #5   Title Increase FOTO score to 71    Baseline Initial score 55    Time 6    Period Weeks    Status New    Target Date 06/01/21                   Plan - 04/25/21 1325     Clinical Impression Statement Todays session reviewed HEP, began aerobic training, performed gait training and introduced seated core tasks into todays session.  Fatigue played a factor in treatmet today.  Focus on BLE strengthening in seated and standing positions.    Personal Factors and Comorbidities Comorbidity 1    Comorbidities CVA    Examination-Activity Limitations Locomotion Level;Transfers;Stairs    Stability/Clinical Decision Making Stable/Uncomplicated    Rehab Potential Good    PT Frequency 2x / week    PT Duration 6 weeks    PT Treatment/Interventions ADLs/Self Care Home Management;Aquatic Therapy;DME Instruction;Gait training;Stair training;Functional mobility training;Therapeutic activities;Balance training;Neuromuscular re-education;Therapeutic exercise;Patient/family education    PT Next Visit Plan HEP review, STS and ambulation distance for endurance, LE strengthening    PT Home Exercise Plan [facility]             Patient will benefit from skilled therapeutic intervention in order to improve the following deficits and impairments:  Abnormal gait, Difficulty walking, Decreased endurance, Decreased activity tolerance, Decreased balance, Decreased strength  Visit Diagnosis: Unsteadiness on feet  Balance disorder  Muscle weakness (generalized)     Problem List Patient Active Problem List   Diagnosis Date Noted   Dysphagia    Leucocytosis    Benign essential HTN    Acute gout of right knee    Acute ischemic right MCA stroke (HCC) 03/12/2021   Alcohol abuse    Seizure (HCC)    Essential hypertension 12/26/2019   Longstanding persistent atrial fibrillation (HCC)    Atrial fibrillation with RVR (HCC) 02/03/2019    Hyperlipidemia 02/03/2019   Anxiety 02/03/2019    Hildred Laser, PT 04/25/2021, 2:27 PM  [facility] [address] [place], [place], [address] Phone: [phone]   Fax:  [phone]  Name: Lorrin Nawrot MRN: [id number] Date of Birth: 09/12/1955

## 2021-04-29 ENCOUNTER — Ambulatory Visit: Payer: Medicare Other | Admitting: Occupational Therapy

## 2021-04-29 ENCOUNTER — Encounter: Payer: Self-pay | Admitting: Occupational Therapy

## 2021-04-29 ENCOUNTER — Other Ambulatory Visit: Payer: Self-pay

## 2021-04-29 ENCOUNTER — Ambulatory Visit: Payer: Medicare Other

## 2021-04-29 DIAGNOSIS — M6281 Muscle weakness (generalized): Secondary | ICD-10-CM | POA: Diagnosis not present

## 2021-04-29 DIAGNOSIS — M25612 Stiffness of left shoulder, not elsewhere classified: Secondary | ICD-10-CM | POA: Diagnosis not present

## 2021-04-29 DIAGNOSIS — M25512 Pain in left shoulder: Secondary | ICD-10-CM

## 2021-04-29 DIAGNOSIS — R2681 Unsteadiness on feet: Secondary | ICD-10-CM | POA: Diagnosis not present

## 2021-04-29 DIAGNOSIS — R2689 Other abnormalities of gait and mobility: Secondary | ICD-10-CM | POA: Diagnosis not present

## 2021-04-29 DIAGNOSIS — R41840 Attention and concentration deficit: Secondary | ICD-10-CM

## 2021-04-29 NOTE — Patient Instructions (Signed)
Access Code: [email or web address] URL: [email or web address] Date: 04/29/2021 Prepared by: Kallie Edward  Exercises Supine Shoulder Flexion Extension AAROM with Dowel - 1 x daily - 7 x weekly - 3 sets - 10 reps Supine Shoulder Press with Dowel - 1 x daily - 7 x weekly - 3 sets - 10 reps Supine Shoulder Horizontal Abduction Adduction AAROM with Dowel - 1 x daily - 7 x weekly - 3 sets - 10 reps

## 2021-04-29 NOTE — Therapy (Signed)
[facility] [address] [place], [place], [address] Phone: [phone]   Fax:  [phone]  Occupational Therapy Treatment  Patient Details  Name: Patrick Padilla MRN: [id number] Date of Birth: 03/31/1956 Referring Provider (OT): Angiulli   Encounter Date: 04/29/2021   OT End of Session - 04/29/21 0936     Visit Number 2    Number of Visits 17    Date for OT Re-Evaluation 06/27/21    Authorization Type Medicare A&B, 20% coinsurance    Authorization - Visit Number 1    Progress Note Due on Visit 10    OT Start Time 0930    OT Stop Time 1015    OT Time Calculation (min) 45 min    Activity Tolerance Patient tolerated treatment well    Behavior During Therapy WFL for tasks assessed/performed             Past Medical History:  Diagnosis Date   Anxiety    Elevated LFTs    History of colon polyps    HLD (hyperlipidemia)    HTN (hypertension)    Localized superficial swelling, mass, or lump     Past Surgical History:  Procedure Laterality Date   CARDIOVERSION N/A 02/05/2019   Procedure: CARDIOVERSION;  Surgeon: Wendall Stade, MD;  Location: [facility]OS[facility];  Service: Cardiovascular;  Laterality: N/A;   CERVICAL DISC SURGERY     IR CT HEAD LTD  03/12/2021   IR PERCUTANEOUS ART THROMBECTOMY/INFUSION INTRACRANIAL INC DIAG ANGIO  03/12/2021   IR US GUIDE VASC ACCESS RIGHT  03/15/2021   LEG SURGERY     right leg and ankle   NECK SURGERY     cyst removed   RADIOLOGY WITH ANESTHESIA N/A 03/12/2021   Procedure: RADIOLOGY WITH ANESTHESIA;  Surgeon: Radiologist, Medication, MD;  Location: [facility] OR;  Service: Radiology;  Laterality: N/A;   TEE WITHOUT CARDIOVERSION N/A 02/05/2019   Procedure: TRANSESOPHAGEAL ECHOCARDIOGRAM (TEE);  Surgeon: Wendall Stade, MD;  Location: [facility] ENDOSCOPY;  Service: Cardiovascular;  Laterality: N/A;   TOTAL HIP ARTHROPLASTY     left    There were no vitals filed for this visit.   Subjective  Assessment - 04/29/21 0935     Subjective  My back, shoulder, and neck hurt.    Pertinent History HTN, Afib, Anxiety, Multiple ortho injuries    Currently in Pain? Yes    Pain Score 2    at rest   Pain Location Shoulder   back, shoulder, neck   Pain Orientation Left    Pain Descriptors / Indicators Aching;Sore    Pain Type Acute pain    Pain Onset 1 to 4 weeks ago    Pain Frequency Constant    Aggravating Factors  movement makes it 5/10    Multiple Pain Sites Yes             Reviewed Goals. Pt in agreement and verbalized understanding.   Manual Therapy AAROM for LUE shoulder flexion and scap mobilizations  Supine Foam Roller HEP - see MedBridge Access Code in pt instructions  Resistance Clothespins 1-8# with LUE with no increase in pain but pt reported soreness and muscle fatigue.                    OT Education - 04/29/21 1003     Education Details Supine Cane Exercises Access Code: [email or web address]    Person(s) Educated Patient    Methods Explanation;Demonstration;Handout    Comprehension Need further instruction;Verbalized  understanding;Returned demonstration              OT Short Term Goals - 04/13/21 1700       OT SHORT TERM GOAL #1   Title Patient will complete an HEP designed to address left shoulder pain and range of motion    Time 4    Period Weeks    Status New      OT SHORT TERM GOAL #2   Title Patient will demonstrate improved safety and confidence (patient report) with transferring into and out of shower.    Time 4    Period Weeks    Status New      OT SHORT TERM GOAL #3   Title Patient will demonstrate improved performance - decreased time, decreased pain in donning/doffing socks    Time 4    Period Weeks    Status New      OT SHORT TERM GOAL #4   Title Patient will prepare simple warm meal for himself with no more than supervision    Time 4    Period Weeks    Status New               OT Long Term Goals - 04/13/21 1702        OT LONG TERM GOAL #1   Title Patient will complete updated HEP designed to increase range of motion, strength and functional use of non-dominant LUE    Time 8    Period Weeks    Status New    Target Date 06/27/21      OT LONG TERM GOAL #2   Title Patient will return to mowing 50% of his lawn with minimal assistance    Time 8    Period Weeks    Status New      OT LONG TERM GOAL #3   Title Patient will return to working with hand (versus power)  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    Time 8    Period Weeks    Status New      OT LONG TERM GOAL #4   Title Patient will report decrease in pain with reaching patterns with LUE through mid to high reach with pain no more than 1-2/10.    Time 8    Period Weeks    Status New      OT LONG TERM GOAL #5   Title Patient will demonstrate 5 lb increase in left grip strength    Time 8    Period Weeks    Status New      Long Term Additional Goals   Additional Long Term Goals Yes      OT LONG TERM GOAL #6   Title Patient will complete FOTO survey on discharge with at least 10 point improvement (55 UE on eval)    Time 8    Period Weeks    Status New                   Plan - 04/29/21 1108     Clinical Impression Statement Pt verbalized understanding and agreement to goals. Pt with continued pain but responded well to mobilizations and exercises today.    OT Occupational Profile and History Detailed Assessment- Review of Records and additional review of physical, cognitive, psychosocial history related to current functional performance    Occupational performance deficits (Please refer to evaluation for details): ADL's;IADL's;Leisure    Body Structure / Function / Physical Skills ADL;Decreased  knowledge of use of DME;Strength;Balance;Dexterity;GMC;Pain;Tone;Body mechanics;Hearing;UE functional use;Cardiopulmonary status limiting  activity;Endurance;IADL;ROM;Vestibular;Coordination;Flexibility;Mobility;Sensation;FMC;Decreased knowledge of precautions    Rehab Potential Good    Clinical Decision Making Several treatment options, min-mod task modification necessary    Comorbidities Affecting Occupational Performance: May have comorbidities impacting occupational performance    Modification or Assistance to Complete Evaluation  Min-Moderate modification of tasks or assist with assess necessary to complete eval    OT Frequency 2x / week    OT Duration 8 weeks    OT Treatment/Interventions Self-care/ADL training;Moist Heat;Fluidtherapy;DME and/or AE instruction;Balance training;Aquatic Therapy;Therapeutic activities;Ultrasound;Therapeutic exercise;Cognitive remediation/compensation;Neuromuscular education;Functional Mobility Training;Passive range of motion;Visual/perceptual remediation/compensation;Electrical Stimulation;Manual Therapy;Patient/family education    Plan Gentle range to left shoulder passive to active assisted - supine.  Needs HEP.  Challenge stand balance    Consulted and Agree with Plan of Care Patient;Family member/caregiver             Patient will benefit from skilled therapeutic intervention in order to improve the following deficits and impairments:   Body Structure / Function / Physical Skills: ADL, Decreased knowledge of use of DME, Strength, Balance, Dexterity, GMC, Pain, Tone, Body mechanics, Hearing, UE functional use, Cardiopulmonary status limiting activity, Endurance, IADL, ROM, Vestibular, Coordination, Flexibility, Mobility, Sensation, FMC, Decreased knowledge of precautions       Visit Diagnosis: Unsteadiness on feet  Muscle weakness (generalized)  Stiffness of left shoulder, not elsewhere classified  Acute pain of left shoulder  Attention and concentration deficit    Problem List Patient Active Problem List   Diagnosis Date Noted   Dysphagia    Leucocytosis    Benign  essential HTN    Acute gout of right knee    Acute ischemic right MCA stroke (HCC) 03/12/2021   Alcohol abuse    Seizure (HCC)    Essential hypertension 12/26/2019   Longstanding persistent atrial fibrillation (HCC)    Atrial fibrillation with RVR (HCC) 02/03/2019   Hyperlipidemia 02/03/2019   Anxiety 02/03/2019    Junious Dresser, OT/L 04/29/2021, 11:09 AM   [facility] [address] [place], [place], [address] Phone: [phone]   Fax:  [phone]  Name: Patrick Padilla MRN: [id number] Date of Birth: [birth year]

## 2021-04-29 NOTE — Therapy (Signed)
[facility] [address] [place], [place], [address] Phone: [phone]   Fax:  [phone]  Physical Therapy Treatment  Patient Details  Name: Patrick Padilla MRN: [id number] Date of Birth: 01-29-1956 No data recorded  Encounter Date: 04/29/2021   PT End of Session - 04/29/21 1022     Visit Number 3    Number of Visits 13    Date for PT Re-Evaluation 06/01/21    Authorization Type MCR/MCD    Progress Note Due on Visit 10    PT Start Time 1015    PT Stop Time 1100    PT Time Calculation (min) 45 min    Equipment Utilized During Treatment Gait belt    Activity Tolerance Patient tolerated treatment well    Behavior During Therapy WFL for tasks assessed/performed             Past Medical History:  Diagnosis Date   Anxiety    Elevated LFTs    History of colon polyps    HLD (hyperlipidemia)    HTN (hypertension)    Localized superficial swelling, mass, or lump     Past Surgical History:  Procedure Laterality Date   CARDIOVERSION N/A 02/05/2019   Procedure: CARDIOVERSION;  Surgeon: Josue Hector, MD;  Location: [facility];  Service: Cardiovascular;  Laterality: N/A;   CERVICAL DISC SURGERY     IR CT HEAD LTD  03/12/2021   IR PERCUTANEOUS ART THROMBECTOMY/INFUSION INTRACRANIAL INC DIAG ANGIO  03/12/2021   IR US GUIDE VASC ACCESS RIGHT  03/15/2021   LEG SURGERY     right leg and ankle   NECK SURGERY     cyst removed   RADIOLOGY WITH ANESTHESIA N/A 03/12/2021   Procedure: RADIOLOGY WITH ANESTHESIA;  Surgeon: Radiologist, Medication, MD;  Location: [facility];  Service: Radiology;  Laterality: N/A;   TEE WITHOUT CARDIOVERSION N/A 02/05/2019   Procedure: TRANSESOPHAGEAL ECHOCARDIOGRAM (TEE);  Surgeon: Josue Hector, MD;  Location: [facility];  Service: Cardiovascular;  Laterality: N/A;   TOTAL HIP ARTHROPLASTY     left    There were no vitals filed for this visit.   Subjective Assessment - 04/29/21 1015      Subjective Has been walking at home for 15 min at a time across flat grass    Patient is accompained by: Family member    Pertinent History MRI+acute infarct at the right basal ganglia. Few and  punctate bifrontal peripheral infarcts. Recent medial left thalamic infarct which may be subacute. 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    Limitations Standing;Walking    How long can you sit comfortably? >15 min    How long can you stand comfortably? <15 min    How long can you walk comfortably? <15 min    Patient Stated Goals To become more steady on his feet and return to outside ambulation    Pain Onset 1 to 4 weeks ago                               [facility] Adult PT Treatment/Exercise - 04/29/21 0001       Transfers   Transfers Sit to Stand;Stand to Sit    Sit to Stand 6: Modified independent (Device/Increase time)    Stand to Sit 6: Modified independent (Device/Increase time)    Number of Reps 10 reps    Comments no UE support needed  Ambulation/Gait   Ambulation/Gait Yes    Ambulation/Gait Assistance 5: Supervision    Ambulation Distance (Feet) 1000 Feet    Assistive device None    Gait Pattern Step-through pattern;Decreased arm swing - right;Decreased arm swing - left;Decreased step length - right;Decreased step length - left;Decreased dorsiflexion - right;Decreased dorsiflexion - left;Lateral hip instability    Ambulation Surface Level;Indoor      Knee/Hip Exercises: Aerobic   Stepper Scifit seat 18 arms 7 L2 x8 min      Knee/Hip Exercises: Standing   Heel Raises Both;1 set;15 reps;Limitations    Heel Raises Limitations BUE support      Knee/Hip Exercises: Seated   Long Arc Quad Strengthening;Both;1 set;15 reps;Limitations    Long Arc Quad Limitations with lat press    Marching Strengthening;Both;1 set;15 reps;Limitations    Marching Limitations with lat press      Ankle Exercises: Standing    Heel Raises Both;15 reps;Limitations    Heel Raises Limitations BUE support    Toe Raise 15 reps;Limitations    Toe Raise Limitations BUE support                 Balance Exercises - 04/29/21 0001       Balance Exercises: Standing   Standing Eyes Closed Wide (BOA);Head turns;Solid surface;30 secs;5 reps;Limitations;Foam/compliant surface    Standing Eyes Closed Limitations 30s static stance, 5 head turns/nods                  PT Short Term Goals - 04/29/21 1104       PT SHORT TERM GOAL #1   Title Patient to become Ind in initial HEP    Baseline [place]; 04/29/21 Compliant with HEP    Time 3    Period Weeks    Status Achieved    Target Date 05/11/21      PT SHORT TERM GOAL #2   Title Patient to negotiate 16 steps with most appropriate pattern and support    Baseline 8 steps with alternating pattern and B rails; 04/25/21 16 steps with single rail and step through pattern    Time 3    Period Weeks    Status Achieved    Target Date 05/11/21      PT SHORT TERM GOAL #3   Title Patient to transfer STS 1x w/o UE support    Baseline Requires BUE support to stand; 04/25/21 Able to stand once arms crossed followed by 4 more with CGA    Time 3    Period Weeks    Status Achieved    Target Date 05/11/21      PT SHORT TERM GOAL #4   Title Patient to ambulate 565f across outdoor surfaces w/o need of AD    Baseline 204findoors w/o need of AD; 04/29/21 Ambulation of 100055fcross paved and grassy surfaces w/o need of AD    Time 3    Period Weeks    Status Achieved    Target Date 06/01/21               PT Long Term Goals - 04/13/21 1440       PT LONG TERM GOAL #1   Title Patient to decreased 5x STS time to <12s w/o UE assist    Baseline 15.4s with BUE assist    Time 6    Period Weeks    Status New    Target Date 06/01/21      PT LONG TERM GOAL #2  Title Patient to increase score of FGA to 26/30    Baseline 20/30    Time 6    Period Weeks     Status New    Target Date 06/01/21      PT LONG TERM GOAL #3   Title Patient to negotiate 16 steps with alternting pattern w/o UE support    Baseline 8 steps with alternating pattern and B UE support    Time 6    Period Weeks    Target Date 06/01/21      PT LONG TERM GOAL #4   Title Ambulate 1029f across outdoor surfaces w/o need of AD    Baseline 2057fin clinic w/o AD    Time 6    Period Weeks    Status New    Target Date 06/01/21      PT LONG TERM GOAL #5   Title Increase FOTO score to 71    Baseline Initial score 55    Time 6    Period Weeks    Status New    Target Date 06/01/21                   Plan - 04/29/21 1055     Clinical Impression Statement Continued LE strengthening and balance training, ambulation outside across paved and grass surfaces, challenged balance on solid and compliant surfaces in EO/EC environments, incorporating head turns/nods with no LOB observed.  Fatigues easily but endurance and strength slowly improving.  Has met STGs at this time    Personal Factors and Comorbidities Comorbidity 1    Comorbidities CVA    Examination-Activity Limitations Locomotion Level;Transfers;Stairs    Stability/Clinical Decision Making Stable/Uncomplicated    Rehab Potential Good    PT Frequency 2x / week    PT Duration 6 weeks    PT Treatment/Interventions ADLs/Self Care Home Management;Aquatic Therapy;DME Instruction;Gait training;Stair training;Functional mobility training;Therapeutic activities;Balance training;Neuromuscular re-education;Therapeutic exercise;Patient/family education    PT Next Visit Plan STS and ambulation distance for endurance, LE strengthening and balance training, stepping tasks    [facility]           Patient will benefit from skilled therapeutic intervention in order to improve the following deficits and impairments:  Abnormal gait, Difficulty walking, Decreased endurance, Decreased activity tolerance,  Decreased balance, Decreased strength  Visit Diagnosis: Unsteadiness on feet  Muscle weakness (generalized)  Balance disorder     Problem List Patient Active Problem List   Diagnosis Date Noted   Dysphagia    Leucocytosis    Benign essential HTN    Acute gout of right knee    Acute ischemic right MCA stroke (HCL[facility]09/09/2020   Alcohol abuse    Seizure ([facility]   Essential hypertension 12/26/2019   Longstanding persistent atrial fibrillation ([facility]   Atrial fibrillation with RVR ([facility]07/27/2020   Hyperlipidemia 02/03/2019   Anxiety 02/03/2019    JeLanice ShirtsPT 04/29/2021, 11:12 AM  [place] AvenueuFingalrTingleyNCAl[place]hone: [phone] Fax:  [phone]: JoConnar KeatingRN: [id number] of Birth: 01/1956-03-01

## 2021-05-02 ENCOUNTER — Ambulatory Visit: Payer: Medicare Other

## 2021-05-02 ENCOUNTER — Ambulatory Visit: Payer: Medicare Other | Admitting: Occupational Therapy

## 2021-05-02 ENCOUNTER — Other Ambulatory Visit: Payer: Self-pay

## 2021-05-02 ENCOUNTER — Encounter: Payer: Self-pay | Admitting: Occupational Therapy

## 2021-05-02 DIAGNOSIS — R2681 Unsteadiness on feet: Secondary | ICD-10-CM

## 2021-05-02 DIAGNOSIS — M25612 Stiffness of left shoulder, not elsewhere classified: Secondary | ICD-10-CM | POA: Diagnosis not present

## 2021-05-02 DIAGNOSIS — R41840 Attention and concentration deficit: Secondary | ICD-10-CM

## 2021-05-02 DIAGNOSIS — M25512 Pain in left shoulder: Secondary | ICD-10-CM | POA: Diagnosis not present

## 2021-05-02 DIAGNOSIS — M6281 Muscle weakness (generalized): Secondary | ICD-10-CM | POA: Diagnosis not present

## 2021-05-02 DIAGNOSIS — R2689 Other abnormalities of gait and mobility: Secondary | ICD-10-CM | POA: Diagnosis not present

## 2021-05-02 NOTE — Therapy (Signed)
[facility] [address] [place], [place], [address] Phone: [phone]   Fax:  [phone]  Physical Therapy Treatment  Patient Details  Name: Patrick Padilla MRN: [id number] Date of Birth: Oct 05, 1955 No data recorded  Encounter Date: 05/02/2021   PT End of Session - 05/02/21 1320     Visit Number 4    Number of Visits 13    Date for PT Re-Evaluation 06/01/21    Authorization Type MCR/MCD    Progress Note Due on Visit 10    PT Start Time 1315    PT Stop Time 1400    PT Time Calculation (min) 45 min    Equipment Utilized During Treatment Gait belt    Activity Tolerance Patient tolerated treatment well    Behavior During Therapy WFL for tasks assessed/performed             Past Medical History:  Diagnosis Date   Anxiety    Elevated LFTs    History of colon polyps    HLD (hyperlipidemia)    HTN (hypertension)    Localized superficial swelling, mass, or lump     Past Surgical History:  Procedure Laterality Date   CARDIOVERSION N/A 02/05/2019   Procedure: CARDIOVERSION;  Surgeon: Wendall Stade, MD;  [facility];  Service: Cardiovascular;  Laterality: N/A;   CERVICAL DISC SURGERY     IR CT HEAD LTD  03/12/2021   IR PERCUTANEOUS ART THROMBECTOMY/INFUSION INTRACRANIAL INC DIAG ANGIO  03/12/2021   IR US GUIDE VASC ACCESS RIGHT  03/15/2021   LEG SURGERY     right leg and ankle   NECK SURGERY     cyst removed   RADIOLOGY WITH ANESTHESIA N/A 03/12/2021   Procedure: RADIOLOGY WITH ANESTHESIA;  Surgeon: Radiologist, Medication, MD;  [facility];  Service: Radiology;  Laterality: N/A;   TEE WITHOUT CARDIOVERSION N/A 02/05/2019   Procedure: TRANSESOPHAGEAL ECHOCARDIOGRAM (TEE);  Surgeon: Wendall Stade, MD;  Location[facility];  Service: Cardiovascular;  Laterality: N/A;   TOTAL HIP ARTHROPLASTY     left    There were no vitals filed for this visit.   Subjective Assessment - 05/02/21 1319      Subjective Still has trouble sleeping at night as he is restless.  Has to leave at 1345due to an appointment    Patient is accompained by: Family member    Pertinent History MRI+acute infarct at the right basal ganglia. Few and  punctate bifrontal peripheral infarcts. Recent medial left thalamic infarct which may be subacute. 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    Limitations Standing;Walking    How long can you sit comfortably? >15 min    How long can you stand comfortably? <15 min    How long can you walk comfortably? <15 min    Patient Stated Goals To become more steady on his feet and return to outside ambulation    Pain Onset 1 to 4 weeks ago                               [facility] Adult PT Treatment/Exercise - 05/02/21 0001       Knee/Hip Exercises: Aerobic   Stepper Scifit seat 18 arms 7 L2.5 x8 min                 Balance Exercises - 05/02/21 0001       Balance Exercises: Standing  Stepping Strategy Anterior;Lateral;10 reps;Limitations    Stepping Strategy Limitations used 4" block without UE support, 10x in each direction    Tandem Gait Forward;Retro;Intermittent upper extremity support;Foam/compliant surface;5 reps;Limitations    Tandem Gait Limitations performed in // bars beginning with BUE support and weaning to no UE support                  PT Short Term Goals - 04/29/21 1104       PT SHORT TERM GOAL #1   Title Patient to become Ind in initial HEP    Baseline [id number]; 04/29/21 Compliant with HEP    Time 3    Period Weeks    Status Achieved    Target Date 05/11/21      PT SHORT TERM GOAL #2   Title Patient to negotiate 16 steps with most appropriate pattern and support    Baseline 8 steps with alternating pattern and B rails; 04/25/21 16 steps with single rail and step through pattern    Time 3    Period Weeks    Status Achieved    Target Date 05/11/21      PT  SHORT TERM GOAL #3   Title Patient to transfer STS 1x w/o UE support    Baseline Requires BUE support to stand; 04/25/21 Able to stand once arms crossed followed by 4 more with CGA    Time 3    Period Weeks    Status Achieved    Target Date 05/11/21      PT SHORT TERM GOAL #4   Title Patient to ambulate 534ft across outdoor surfaces w/o need of AD    Baseline 267ft indoors w/o need of AD; 04/29/21 Ambulation of 1035ft across paved and grassy surfaces w/o need of AD    Time 3    Period Weeks    Status Achieved    Target Date 06/01/21               PT Long Term Goals - 04/13/21 1440       PT LONG TERM GOAL #1   Title Patient to decreased 5x STS time to <12s w/o UE assist    Baseline 15.4s with BUE assist    Time 6    Period Weeks    Status New    Target Date 06/01/21      PT LONG TERM GOAL #2   Title Patient to increase score of FGA to 26/30    Baseline 20/30    Time 6    Period Weeks    Status New    Target Date 06/01/21      PT LONG TERM GOAL #3   Title Patient to negotiate 16 steps with alternting pattern w/o UE support    Baseline 8 steps with alternating pattern and B UE support    Time 6    Period Weeks    Target Date 06/01/21      PT LONG TERM GOAL #4   Title Ambulate 1030ft across outdoor surfaces w/o need of AD    Baseline 238ft in clinic w/o AD    Time 6    Period Weeks    Status New    Target Date 06/01/21      PT LONG TERM GOAL #5   Title Increase FOTO score to 71    Baseline Initial score 55    Time 6    Period Weeks    Status New    Target Date 06/01/21  Plan - 05/02/21 1415     Clinical Impression Statement (patient restricted on time)  Todays session focused on BLE strengthening and conditioning, balance and stepping tasks and gait training to reduce fall risk.  Added tamdem walking across compliant surface with diminishing UE support as well as step ups on fwd/lat directions.  Insatbility noted with tandem  walking w/o UE support    Personal Factors and Comorbidities Comorbidity 1    Comorbidities CVA    Examination-Activity Limitations Locomotion Level;Transfers;Stairs    Stability/Clinical Decision Making Stable/Uncomplicated    Rehab Potential Good    PT Frequency 2x / week    PT Duration 6 weeks    PT Treatment/Interventions ADLs/Self Care Home Management;Aquatic Therapy;DME Instruction;Gait training;Stair training;Functional mobility training;Therapeutic activities;Balance training;Neuromuscular re-education;Therapeutic exercise;Patient/family education    PT Next Visit Plan STS with ball, LE strengthening and balance training, stepping tasks    PT Home Exercise Plan [facility]             Patient will benefit from skilled therapeutic intervention in order to improve the following deficits and impairments:  Abnormal gait, Difficulty walking, Decreased endurance, Decreased activity tolerance, Decreased balance, Decreased strength  Visit Diagnosis: Unsteadiness on feet  Muscle weakness (generalized)  Balance disorder     Problem List Patient Active Problem List   Diagnosis Date Noted   Dysphagia    Leucocytosis    Benign essential HTN    Acute gout of right knee    Acute ischemic right MCA stroke (HCC) 03/12/2021   Alcohol abuse    Seizure (HCC)    Essential hypertension 12/26/2019   Longstanding persistent atrial fibrillation (HCC)    Atrial fibrillation with RVR (HCC) 02/03/2019   Hyperlipidemia 02/03/2019   Anxiety 02/03/2019    Hildred Laser, PT 05/02/2021, 2:20 PM  [facility] [address] [place], [place], [address] Phone: [phone]   Fax:  [phone]  Name: Patrick Padilla MRN: [id number] Date of Birth: 07-08-1956

## 2021-05-02 NOTE — Therapy (Signed)
[facility] [address] [place], [place], [address] Phone: [phone]   Fax:  [phone]  Occupational Therapy Treatment  Patient Details  Name: Patrick Padilla MRN: [id number] Date of Birth: 1956/06/01 Referring Provider (OT): Angiulli   Encounter Date: 05/02/2021   OT End of Session - 05/02/21 1238     Visit Number 3    Number of Visits 17    Date for OT Re-Evaluation 06/27/21    Authorization Type Medicare A&B, 20% coinsurance    Authorization - Visit Number 2    Progress Note Due on Visit 10    OT Start Time 1235    OT Stop Time 1315    OT Time Calculation (min) 40 min    Activity Tolerance Patient tolerated treatment well    Behavior During Therapy WFL for tasks assessed/performed             Past Medical History:  Diagnosis Date   Anxiety    Elevated LFTs    History of colon polyps    HLD (hyperlipidemia)    HTN (hypertension)    Localized superficial swelling, mass, or lump     Past Surgical History:  Procedure Laterality Date   CARDIOVERSION N/A 02/05/2019   Procedure: CARDIOVERSION;  Surgeon: Wendall Stade, MD;  [facility];  Service: Cardiovascular;  Laterality: N/A;   CERVICAL DISC SURGERY     IR CT HEAD LTD  03/12/2021   IR PERCUTANEOUS ART THROMBECTOMY/INFUSION INTRACRANIAL INC DIAG ANGIO  03/12/2021   IR US GUIDE VASC ACCESS RIGHT  03/15/2021   LEG SURGERY     right leg and ankle   NECK SURGERY     cyst removed   RADIOLOGY WITH ANESTHESIA N/A 03/12/2021   Procedure: RADIOLOGY WITH ANESTHESIA;  Surgeon: Radiologist, Medication, MD;  [facility];  Service: Radiology;  Laterality: N/A;   TEE WITHOUT CARDIOVERSION N/A 02/05/2019   Procedure: TRANSESOPHAGEAL ECHOCARDIOGRAM (TEE);  Surgeon: Wendall Stade, MD;  Location: [facility] ENDOSCOPY;  Service: Cardiovascular;  Laterality: N/A;   TOTAL HIP ARTHROPLASTY     left    There were no vitals filed for this visit.   Subjective  Assessment - 05/02/21 1237     Subjective  "i'm right in the middle of my black jack game"    Pertinent History HTN, Afib, Anxiety, Multiple ortho injuries    Currently in Pain? Yes    Pain Score 5    worse at night   Pain Location Shoulder    Pain Orientation Left    Pain Descriptors / Indicators Aching;Sore    Pain Type Acute pain    Pain Onset 1 to 4 weeks ago    Pain Frequency Constant             Side lying on L and R sides  for addressing LUE shoulder range of motion and assessing and addressing pain. Pt with poor tolerance of gentle rolling onto L side with LUE external rotation. Much increased sharp pain present. Pt highly guarding and tensing LUE shoulder during movement.   Hand Gripper: with LUE on level 3 with black spring. Pt picked up 1 inch blocks with gripper with min drops and min difficulty. Fatigue present and more difficulty towards end.    Ball on Wall x 10 reps with no increase in pain but faigue in LUE shoulder per pt report  Table Slides  OT Short Term Goals - 04/13/21 1700       OT SHORT TERM GOAL #1   Title Patient will complete an HEP designed to address left shoulder pain and range of motion    Time 4    Period Weeks    Status New      OT SHORT TERM GOAL #2   Title Patient will demonstrate improved safety and confidence (patient report) with transferring into and out of shower.    Time 4    Period Weeks    Status New      OT SHORT TERM GOAL #3   Title Patient will demonstrate improved performance - decreased time, decreased pain in donning/doffing socks    Time 4    Period Weeks    Status New      OT SHORT TERM GOAL #4   Title Patient will prepare simple warm meal for himself with no more than supervision    Time 4    Period Weeks    Status New               OT Long Term Goals - 04/13/21 1702       OT LONG TERM GOAL #1   Title Patient will complete updated HEP designed to increase range  of motion, strength and functional use of non-dominant LUE    Time 8    Period Weeks    Status New    Target Date 06/27/21      OT LONG TERM GOAL #2   Title Patient will return to mowing 50% of his lawn with minimal assistance    Time 8    Period Weeks    Status New      OT LONG TERM GOAL #3   Title Patient will return to working with hand (versus power)  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    Time 8    Period Weeks    Status New      OT LONG TERM GOAL #4   Title Patient will report decrease in pain with reaching patterns with LUE through mid to high reach with pain no more than 1-2/10.    Time 8    Period Weeks    Status New      OT LONG TERM GOAL #5   Title Patient will demonstrate 5 lb increase in left grip strength    Time 8    Period Weeks    Status New      Long Term Additional Goals   Additional Long Term Goals Yes      OT LONG TERM GOAL #6   Title Patient will complete FOTO survey on discharge with at least 10 point improvement (55 UE on eval)    Time 8    Period Weeks    Status New                   Plan - 05/02/21 1302     Clinical Impression Statement Pt with increased pain with external rotation today in LUE. Possibly consider requesting MRI if pain does not get better.    OT Occupational Profile and History Detailed Assessment- Review of Records and additional review of physical, cognitive, psychosocial history related to current functional performance    Occupational performance deficits (Please refer to evaluation for details): ADL's;IADL's;Leisure    Body Structure / Function / Physical Skills ADL;Decreased knowledge of use of DME;Strength;Balance;Dexterity;GMC;Pain;Tone;Body mechanics;Hearing;UE functional use;Cardiopulmonary status limiting activity;Endurance;IADL;ROM;Vestibular;Coordination;Flexibility;Mobility;Sensation;FMC;Decreased knowledge of precautions  Rehab Potential Good    Clinical Decision Making Several  treatment options, min-mod task modification necessary    Comorbidities Affecting Occupational Performance: May have comorbidities impacting occupational performance    Modification or Assistance to Complete Evaluation  Min-Moderate modification of tasks or assist with assess necessary to complete eval    OT Frequency 2x / week    OT Duration 8 weeks    OT Treatment/Interventions Self-care/ADL training;Moist Heat;Fluidtherapy;DME and/or AE instruction;Balance training;Aquatic Therapy;Therapeutic activities;Ultrasound;Therapeutic exercise;Cognitive remediation/compensation;Neuromuscular education;Functional Mobility Training;Passive range of motion;Visual/perceptual remediation/compensation;Electrical Stimulation;Manual Therapy;Patient/family education    Plan Gentle range to left shoulder passive to active assisted - supine.  Continue to add to HEP PRN.  Challenge stand balance    Consulted and Agree with Plan of Care Patient;Family member/caregiver             Patient will benefit from skilled therapeutic intervention in order to improve the following deficits and impairments:   Body Structure / Function / Physical Skills: ADL, Decreased knowledge of use of DME, Strength, Balance, Dexterity, GMC, Pain, Tone, Body mechanics, Hearing, UE functional use, Cardiopulmonary status limiting activity, Endurance, IADL, ROM, Vestibular, Coordination, Flexibility, Mobility, Sensation, FMC, Decreased knowledge of precautions       Visit Diagnosis: Unsteadiness on feet  Muscle weakness (generalized)  Stiffness of left shoulder, not elsewhere classified  Acute pain of left shoulder  Attention and concentration deficit    Problem List Patient Active Problem List   Diagnosis Date Noted   Dysphagia    Leucocytosis    Benign essential HTN    Acute gout of right knee    Acute ischemic right MCA stroke (HCC) 03/12/2021   Alcohol abuse    Seizure (HCC)    Essential hypertension 12/26/2019    Longstanding persistent atrial fibrillation (HCC)    Atrial fibrillation with RVR (HCC) 02/03/2019   Hyperlipidemia 02/03/2019   Anxiety 02/03/2019    Junious Dresser, OT/L 05/02/2021, 1:15 PM  [facility] [address] [place], [place], [address] Phone: [phone]   Fax:  [phone]  Name: Patrick Padilla MRN: [id number] Date of Birth: January 18, 1956

## 2021-05-04 ENCOUNTER — Ambulatory Visit: Payer: Medicare Other | Admitting: Occupational Therapy

## 2021-05-04 ENCOUNTER — Ambulatory Visit: Payer: Medicare Other

## 2021-05-04 ENCOUNTER — Other Ambulatory Visit: Payer: Self-pay

## 2021-05-04 ENCOUNTER — Encounter: Payer: Self-pay | Admitting: Occupational Therapy

## 2021-05-04 DIAGNOSIS — M25612 Stiffness of left shoulder, not elsewhere classified: Secondary | ICD-10-CM

## 2021-05-04 DIAGNOSIS — R2681 Unsteadiness on feet: Secondary | ICD-10-CM | POA: Diagnosis not present

## 2021-05-04 DIAGNOSIS — M25512 Pain in left shoulder: Secondary | ICD-10-CM | POA: Diagnosis not present

## 2021-05-04 DIAGNOSIS — R2689 Other abnormalities of gait and mobility: Secondary | ICD-10-CM | POA: Diagnosis not present

## 2021-05-04 DIAGNOSIS — R41840 Attention and concentration deficit: Secondary | ICD-10-CM | POA: Diagnosis not present

## 2021-05-04 DIAGNOSIS — M6281 Muscle weakness (generalized): Secondary | ICD-10-CM

## 2021-05-04 NOTE — Therapy (Signed)
[facility] [address] [place], [place], [address] Phone: [phone]   Fax:  [phone]  Physical Therapy Treatment  Patient Details  Name: Patrick Padilla MRN: [id number] Date of Birth: 1955-07-26 No data recorded  Encounter Date: 05/04/2021   PT End of Session - 05/04/21 1315     Visit Number 4    Number of Visits 13    Date for PT Re-Evaluation 06/01/21    Authorization Type MCR/MCD    Progress Note Due on Visit 10    PT Start Time 1320    PT Stop Time 1400    PT Time Calculation (min) 40 min    Equipment Utilized During Treatment Gait belt    Activity Tolerance Patient tolerated treatment well    Behavior During Therapy WFL for tasks assessed/performed             Past Medical History:  Diagnosis Date   Anxiety    Elevated LFTs    History of colon polyps    HLD (hyperlipidemia)    HTN (hypertension)    Localized superficial swelling, mass, or lump     Past Surgical History:  Procedure Laterality Date   CARDIOVERSION N/A 02/05/2019   Procedure: CARDIOVERSION;  Surgeon: Wendall Stade, MD;  Location: [facility];  Service: Cardiovascular;  Laterality: N/A;   CERVICAL DISC SURGERY     IR CT HEAD LTD  03/12/2021   IR PERCUTANEOUS ART THROMBECTOMY/INFUSION INTRACRANIAL INC DIAG ANGIO  03/12/2021   IR US GUIDE VASC ACCESS RIGHT  03/15/2021   LEG SURGERY     right leg and ankle   NECK SURGERY     cyst removed   RADIOLOGY WITH ANESTHESIA N/A 03/12/2021   Procedure: RADIOLOGY WITH ANESTHESIA;  Surgeon: Radiologist, Medication, MD;  Location: [facility] OR;  Service: Radiology;  Laterality: N/A;   TEE WITHOUT CARDIOVERSION N/A 02/05/2019   Procedure: TRANSESOPHAGEAL ECHOCARDIOGRAM (TEE);  Surgeon: Wendall Stade, MD;  Location: [facility];  Service: Cardiovascular;  Laterality: N/A;   TOTAL HIP ARTHROPLASTY     left    There were no vitals filed for this visit.   Subjective Assessment - 05/04/21 1324      Subjective No changes to note, has occasional dizziness when standing initially, resolves spontaneously    Patient is accompained by: Family member    Pertinent History MRI+acute infarct at the right basal ganglia. Few and  punctate bifrontal peripheral infarcts. Recent medial left thalamic infarct which may be subacute. 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    Limitations Standing;Walking    How long can you sit comfortably? >15 min    How long can you stand comfortably? <15 min    How long can you walk comfortably? <15 min    Patient Stated Goals To become more steady on his feet and return to outside ambulation    Pain Onset More than a month ago                               [facility] Adult PT Treatment/Exercise - 05/04/21 0001       Transfers   Transfers Sit to Stand;Stand to Sit    Sit to Stand 6: Modified independent (Device/Increase time)    Stand to Sit 6: Modified independent (Device/Increase time)    Number of Reps 10 reps    Comments from airex reaching OH when standing, needing UE support  at times due to fatigue      Ambulation/Gait   Ambulation/Gait Yes    Ambulation/Gait Assistance 5: Supervision    Ambulation/Gait Assistance Details head turns and nods randomly    Ambulation Distance (Feet) 400 Feet    Assistive device None    Gait Pattern Step-through pattern;Decreased arm swing - right;Decreased arm swing - left;Decreased step length - right;Decreased step length - left;Decreased dorsiflexion - right;Decreased dorsiflexion - left;Lateral hip instability    Ambulation Surface Level;Indoor      Lumbar Exercises: Seated   Other Seated Lumbar Exercises core exercises of hip/shoulder tosses, chops and Vs using 3.3 ball for 10 reps each.      Knee/Hip Exercises: Aerobic   Stepper Scifit seat 18 arms 7 L3 x8 min                 Balance Exercises - 05/04/21 0001       Balance  Exercises: Standing   Rockerboard Anterior/posterior;Lateral;Head turns;5 reps;EO;Limitations    Rockerboard Limitations 5 head turns/nods with intermittent UE touch needed    Tandem Gait Forward;Retro;Intermittent upper extremity support;5 reps;Limitations    Tandem Gait Limitations performed at counter across solid surface 5 trips    Sidestepping Upper extremity support;Limitations    Sidestepping Limitations 5 trips at counter                  PT Short Term Goals - 04/29/21 1104       PT SHORT TERM GOAL #1   Title Patient to become Ind in initial HEP    Baseline [id number]; 04/29/21 Compliant with HEP    Time 3    Period Weeks    Status Achieved    Target Date 05/11/21      PT SHORT TERM GOAL #2   Title Patient to negotiate 16 steps with most appropriate pattern and support    Baseline 8 steps with alternating pattern and B rails; 04/25/21 16 steps with single rail and step through pattern    Time 3    Period Weeks    Status Achieved    Target Date 05/11/21      PT SHORT TERM GOAL #3   Title Patient to transfer STS 1x w/o UE support    Baseline Requires BUE support to stand; 04/25/21 Able to stand once arms crossed followed by 4 more with CGA    Time 3    Period Weeks    Status Achieved    Target Date 05/11/21      PT SHORT TERM GOAL #4   Title Patient to ambulate 5101ft across outdoor surfaces w/o need of AD    Baseline 282ft indoors w/o need of AD; 04/29/21 Ambulation of 1059ft across paved and grassy surfaces w/o need of AD    Time 3    Period Weeks    Status Achieved    Target Date 06/01/21               PT Long Term Goals - 04/13/21 1440       PT LONG TERM GOAL #1   Title Patient to decreased 5x STS time to <12s w/o UE assist    Baseline 15.4s with BUE assist    Time 6    Period Weeks    Status New    Target Date 06/01/21      PT LONG TERM GOAL #2   Title Patient to increase score of FGA to 26/30    Baseline 20/30  Time 6    Period  Weeks    Status New    Target Date 06/01/21      PT LONG TERM GOAL #3   Title Patient to negotiate 16 steps with alternting pattern w/o UE support    Baseline 8 steps with alternating pattern and B UE support    Time 6    Period Weeks    Target Date 06/01/21      PT LONG TERM GOAL #4   Title Ambulate 1055ft across outdoor surfaces w/o need of AD    Baseline 226ft in clinic w/o AD    Time 6    Period Weeks    Status New    Target Date 06/01/21      PT LONG TERM GOAL #5   Title Increase FOTO score to 71    Baseline Initial score 55    Time 6    Period Weeks    Status New    Target Date 06/01/21                   Plan - 05/04/21 1358     Clinical Impression Statement Todays session continued to focus on gait and balance training with LE and core strengthening added.  Some LOB noted with head turns and nods during ambulation.  Incorporated head motions while balancing on rockerboard.  BLE weakness evident in performing STS transfers as patient needed UE support to complete 10 reps.    Personal Factors and Comorbidities Comorbidity 1    Comorbidities CVA    Examination-Activity Limitations Locomotion Level;Transfers;Stairs    Stability/Clinical Decision Making Stable/Uncomplicated    Rehab Potential Good    PT Frequency 2x / week    PT Duration 6 weeks    PT Treatment/Interventions ADLs/Self Care Home Management;Aquatic Therapy;DME Instruction;Gait training;Stair training;Functional mobility training;Therapeutic activities;Balance training;Neuromuscular re-education;Therapeutic exercise;Patient/family education    PT Next Visit Plan STS with ball, LE strengthening and balance training, stepping tasks, head motions during balance and gait to resolve dizziness.    PT Home Exercise Plan [facility]             Patient will benefit from skilled therapeutic intervention in order to improve the following deficits and impairments:  Abnormal gait, Difficulty walking,  Decreased endurance, Decreased activity tolerance, Decreased balance, Decreased strength  Visit Diagnosis: Unsteadiness on feet  Muscle weakness (generalized)  Balance disorder     Problem List Patient Active Problem List   Diagnosis Date Noted   Dysphagia    Leucocytosis    Benign essential HTN    Acute gout of right knee    Acute ischemic right MCA stroke (HCC) 03/12/2021   Alcohol abuse    Seizure (HCC)    Essential hypertension 12/26/2019   Longstanding persistent atrial fibrillation (HCC)    Atrial fibrillation with RVR (HCC) 02/03/2019   Hyperlipidemia 02/03/2019   Anxiety 02/03/2019    Hildred Laser, PT 05/04/2021, 2:29 PM  [facility] [address] [place], [place], [address] Phone: [phone]   Fax:  [phone]  Name: Patrick Padilla MRN: [id number] Date of Birth: 1956/05/28

## 2021-05-04 NOTE — Therapy (Signed)
[facility] [address] [place], [place], [address] Phone: [phone]   Fax:  [phone]  Occupational Therapy Treatment  Patient Details  Name: Patrick Padilla MRN: [id number] Date of Birth: [birth year] Referring Provider (OT): Angiulli   Encounter Date: 05/04/2021   OT End of Session - 05/04/21 1634     Visit Number 4    Number of Visits 17    Date for OT Re-Evaluation 06/27/21    Authorization Type Medicare A&B, 20% coinsurance    Authorization - Visit Number 3    Progress Note Due on Visit 10    OT Start Time 1230    OT Stop Time 1315    OT Time Calculation (min) 45 min    Activity Tolerance Patient tolerated treatment well    Behavior During Therapy WFL for tasks assessed/performed             Past Medical History:  Diagnosis Date   Anxiety    Elevated LFTs    History of colon polyps    HLD (hyperlipidemia)    HTN (hypertension)    Localized superficial swelling, mass, or lump     Past Surgical History:  Procedure Laterality Date   CARDIOVERSION N/A 02/05/2019   Procedure: CARDIOVERSION;  Surgeon: Wendall Stade, MD;  Location: [facility];  Service: Cardiovascular;  Laterality: N/A;   CERVICAL DISC SURGERY     IR CT HEAD LTD  03/12/2021   IR PERCUTANEOUS ART THROMBECTOMY/INFUSION INTRACRANIAL INC DIAG ANGIO  03/12/2021   IR US GUIDE VASC ACCESS RIGHT  03/15/2021   LEG SURGERY     right leg and ankle   NECK SURGERY     cyst removed   RADIOLOGY WITH ANESTHESIA N/A 03/12/2021   Procedure: RADIOLOGY WITH ANESTHESIA;  Surgeon: Radiologist, Medication, MD;  Location: [facility] OR;  Service: Radiology;  Laterality: N/A;   TEE WITHOUT CARDIOVERSION N/A 02/05/2019   Procedure: TRANSESOPHAGEAL ECHOCARDIOGRAM (TEE);  Surgeon: Wendall Stade, MD;  Location: [facility] ENDOSCOPY;  Service: Cardiovascular;  Laterality: N/A;   TOTAL HIP ARTHROPLASTY     left    There were no vitals filed for this visit.   Subjective  Assessment - 05/04/21 1239     Subjective  Pain in neck, left shoulder, middle back pain - difficulty sleeping in bed    Pertinent History HTN, Afib, Anxiety, Multiple ortho injuries    Currently in Pain? Yes    Pain Score 3     Pain Location Shoulder    Pain Orientation Left    Pain Descriptors / Indicators Aching    Pain Onset More than a month ago    Pain Frequency Constant    Aggravating Factors  lying down to sleep, reaching    Pain Relieving Factors rest,                          OT Treatments/Exercises (OP) - 05/04/21 1626       Neurological Re-education Exercises   Other Exercises 1 Neuromuscular reeducation to address postural control and mobility in trunk as related to LUE alignment and activation.  Patient with limited rib cage and pelvis mobility initially - active guarding and stiffness.  Worked to increase movement of pelvis and ribs then to realign left shoulder girdle - stretching anterior chest muscles.  Follwoed with long arm weight bearing as tolerated - patient with mild discomfort in wrist - able to tolerate with modification.  Patient with improved  shoulder motion and decreased stiffness / pain after activity.  Described tenderness from stretch - but less pain.                      OT Short Term Goals - 05/04/21 1637       OT SHORT TERM GOAL #1   Title Patient will complete an HEP designed to address left shoulder pain and range of motion    Time 4    Period Weeks    Status On-going      OT SHORT TERM GOAL #2   Title Patient will demonstrate improved safety and confidence (patient report) with transferring into and out of shower.    Time 4    Period Weeks    Status On-going      OT SHORT TERM GOAL #3   Title Patient will demonstrate improved performance - decreased time, decreased pain in donning/doffing socks    Time 4    Period Weeks    Status On-going      OT SHORT TERM GOAL #4   Title Patient will prepare simple warm  meal for himself with no more than supervision    Time 4    Period Weeks    Status On-going               OT Long Term Goals - 05/04/21 1637       OT LONG TERM GOAL #1   Title Patient will complete updated HEP designed to increase range of motion, strength and functional use of non-dominant LUE    Time 8    Period Weeks    Status On-going      OT LONG TERM GOAL #2   Title Patient will return to mowing 50% of his lawn with minimal assistance    Time 8    Period Weeks    Status On-going      OT LONG TERM GOAL #3   Title Patient will return to working with hand (versus power)  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    Time 8    Period Weeks    Status On-going      OT LONG TERM GOAL #4   Title Patient will report decrease in pain with reaching patterns with LUE through mid to high reach with pain no more than 1-2/10.    Time 8    Period Weeks    Status On-going      OT LONG TERM GOAL #5   Title Patient will demonstrate 5 lb increase in left grip strength    Time 8    Period Weeks    Status On-going      OT LONG TERM GOAL #6   Title Patient will complete FOTO survey on discharge with at least 10 point improvement (55 UE on eval)    Time 8    Period Weeks    Status On-going                   Plan - 05/04/21 1635     Clinical Impression Statement Pt showing slow improvement with LUE pain, although tolerated alignment and active lengthening this session - then reported decreased pain.    OT Occupational Profile and History Detailed Assessment- Review of Records and additional review of physical, cognitive, psychosocial history related to current functional performance    Occupational performance deficits (Please refer to evaluation for details): ADL's;IADL's;Leisure    Body Structure / Function /  Physical Skills ADL;Decreased knowledge of use of DME;Strength;Balance;Dexterity;GMC;Pain;Tone;Body mechanics;Hearing;UE functional  use;Cardiopulmonary status limiting activity;Endurance;IADL;ROM;Vestibular;Coordination;Flexibility;Mobility;Sensation;FMC;Decreased knowledge of precautions    Rehab Potential Good    Clinical Decision Making Several treatment options, min-mod task modification necessary    Comorbidities Affecting Occupational Performance: May have comorbidities impacting occupational performance    Modification or Assistance to Complete Evaluation  Min-Moderate modification of tasks or assist with assess necessary to complete eval    OT Frequency 2x / week    OT Duration 8 weeks    OT Treatment/Interventions Self-care/ADL training;Moist Heat;Fluidtherapy;DME and/or AE instruction;Balance training;Aquatic Therapy;Therapeutic activities;Ultrasound;Therapeutic exercise;Cognitive remediation/compensation;Neuromuscular education;Functional Mobility Training;Passive range of motion;Visual/perceptual remediation/compensation;Electrical Stimulation;Manual Therapy;Patient/family education    Plan Gentle range to left shoulder passive to active assisted - supine.  Continue to add to HEP PRN.  Challenge stand balance    Consulted and Agree with Plan of Care Patient;Family member/caregiver             Patient will benefit from skilled therapeutic intervention in order to improve the following deficits and impairments:   Body Structure / Function / Physical Skills: ADL, Decreased knowledge of use of DME, Strength, Balance, Dexterity, GMC, Pain, Tone, Body mechanics, Hearing, UE functional use, Cardiopulmonary status limiting activity, Endurance, IADL, ROM, Vestibular, Coordination, Flexibility, Mobility, Sensation, FMC, Decreased knowledge of precautions       Visit Diagnosis: Attention and concentration deficit  Acute pain of left shoulder  Stiffness of left shoulder, not elsewhere classified  Muscle weakness (generalized)  Unsteadiness on feet    Problem List Patient Active Problem List   Diagnosis Date  Noted   Dysphagia    Leucocytosis    Benign essential HTN    Acute gout of right knee    Acute ischemic right MCA stroke (HCC) 03/12/2021   Alcohol abuse    Seizure (HCC)    Essential hypertension 12/26/2019   Longstanding persistent atrial fibrillation (HCC)    Atrial fibrillation with RVR (HCC) 02/03/2019   Hyperlipidemia 02/03/2019   Anxiety 02/03/2019    Collier Salina, OT/L 05/04/2021, 4:38 PM  [facility] [address] [place], [place], [address] Phone: [phone]   Fax:  [phone]  Name: Patrick Padilla MRN: [id number] Date of Birth: 1956-01-21

## 2021-05-10 ENCOUNTER — Ambulatory Visit: Payer: Medicare Other | Admitting: Speech Pathology

## 2021-05-10 ENCOUNTER — Ambulatory Visit: Payer: Medicare Other

## 2021-05-10 ENCOUNTER — Encounter: Payer: Self-pay | Admitting: Occupational Therapy

## 2021-05-10 ENCOUNTER — Other Ambulatory Visit: Payer: Self-pay

## 2021-05-10 ENCOUNTER — Encounter: Payer: Self-pay | Admitting: Speech Pathology

## 2021-05-10 ENCOUNTER — Ambulatory Visit: Payer: Medicare Other | Attending: Physician Assistant | Admitting: Occupational Therapy

## 2021-05-10 VITALS — BP 118/70 | HR 76

## 2021-05-10 DIAGNOSIS — R2681 Unsteadiness on feet: Secondary | ICD-10-CM | POA: Diagnosis not present

## 2021-05-10 DIAGNOSIS — R41841 Cognitive communication deficit: Secondary | ICD-10-CM

## 2021-05-10 DIAGNOSIS — M25612 Stiffness of left shoulder, not elsewhere classified: Secondary | ICD-10-CM

## 2021-05-10 DIAGNOSIS — M25512 Pain in left shoulder: Secondary | ICD-10-CM | POA: Diagnosis not present

## 2021-05-10 DIAGNOSIS — M6281 Muscle weakness (generalized): Secondary | ICD-10-CM | POA: Diagnosis not present

## 2021-05-10 DIAGNOSIS — R2689 Other abnormalities of gait and mobility: Secondary | ICD-10-CM | POA: Insufficient documentation

## 2021-05-10 DIAGNOSIS — R41840 Attention and concentration deficit: Secondary | ICD-10-CM | POA: Diagnosis not present

## 2021-05-10 NOTE — Patient Instructions (Signed)
Shoulder and left arm exercises:  Lie on your back in bed - raise your head up and roll onto your left side.   Bring your pillow around under your head.    Bend and straighten your elbow - work on moving arm smoothly Turn your forearm up and down - palm up and palm down Bend your wrist and straighten your wrist.   Close your fist and open your fist.    Do each exercise 5 times.

## 2021-05-10 NOTE — Patient Instructions (Addendum)
   3 ring binder would be good to bring to each therapy - keep all PT, OT and ST notes and exercises in the binder    Part of Vaughn's rehab is having him recall his daily meds. Can he have a pill organizer near where he sits. If you think this is safe. Monitor meds, but try to encourage Jeremyah to remember to take them.  10:00 - take meds - try not to rely on Towner as much.  Look at the desk top - cross off each day as it is over  Kemet reports he lacks motivation, this can be a side effect of a brain injury. Evie, do try and push through this   Write down 3 chores to do a day that you are safe doing - folding laundry, dishes, light sweeping etc that can also help Fredric Mare  Include a brain activity daily:   Checkers [employer] games Jig saw puzzles Easy cross words Memory match Board games [employer] Learn a new game!  Listen to and discuss Ted Talks or Podcasts Read and discuss short articles of interest to you- Take notes on these if memory is a challenge Discuss social media posts Look and discuss photo albums  The best activities to improve cognition are functional, real life activities that are important to you:  Plan a menu Participate in household chores and decisions (with supervision) Participate in managing finances Plan a party, trip or tailgate with all of the details (even if you aren't really going to carry it out) Participate in your hobby as you are able with assistance Manage your texts, emails with supervision if needed. Google search for items (even if you're not really going to buy anything) and compare prices and features Socialize -  however, too many visitors can be overwhelming, so set limits "My doctor said I should only visit (or talk) for 20 minutes" or "I do better when I visit with just 1-2 people at a time for 20 minutes"    It's good to use real in-person games, not just apps  Apps:  [employer] Elevate There are apps for  most of the games listed above

## 2021-05-10 NOTE — Therapy (Signed)
[facility] [address] [place], [place], [address] Phone: [phone]   Fax:  [phone]  Occupational Therapy Treatment  Patient Details  Name: Deron Ponce MRN: [id number] Date of Birth: 26-May-1956 Referring Provider (OT): [facility]   Encounter Date: 05/10/2021   OT End of Session - 05/10/21 1225     Visit Number 5    Number of Visits 17    Date for OT Re-Evaluation 06/27/21    Authorization Type Medicare A&B, 20% coinsurance    Authorization - Visit Number 4    Progress Note Due on Visit 10    OT Start Time 1145    OT Stop Time 1226    OT Time Calculation (min) 41 min    Activity Tolerance Patient tolerated treatment well    Behavior During Therapy WFL for tasks assessed/performed             Past Medical History:  Diagnosis Date   Anxiety    Elevated LFTs    History of colon polyps    HLD (hyperlipidemia)    HTN (hypertension)    Localized superficial swelling, mass, or lump     Past Surgical History:  Procedure Laterality Date   CARDIOVERSION N/A 02/05/2019   Procedure: CARDIOVERSION;  Surgeon: Josue Hector, MD;  Location: [facility];  Service: Cardiovascular;  Laterality: N/A;   CERVICAL DISC SURGERY     IR CT HEAD LTD  03/12/2021   IR PERCUTANEOUS ART THROMBECTOMY/INFUSION INTRACRANIAL INC DIAG ANGIO  03/12/2021   IR US GUIDE VASC ACCESS RIGHT  03/15/2021   LEG SURGERY     right leg and ankle   NECK SURGERY     cyst removed   RADIOLOGY WITH ANESTHESIA N/A 03/12/2021   Procedure: RADIOLOGY WITH ANESTHESIA;  Surgeon: Radiologist, Medication, MD;  Location: [facility];  Service: Radiology;  Laterality: N/A;   TEE WITHOUT CARDIOVERSION N/A 02/05/2019   Procedure: TRANSESOPHAGEAL ECHOCARDIOGRAM (TEE);  Surgeon: Josue Hector, MD;  Location: [facility] ENDOSCOPY;  Service: Cardiovascular;  Laterality: N/A;   TOTAL HIP ARTHROPLASTY     left    There were no vitals filed for this visit.   Subjective  Assessment - 05/10/21 1147     Subjective  My arm is feeling better- I was able to sleep more than 3 hours    Pertinent History HTN, Afib, Anxiety, Multiple ortho injuries    Currently in Pain? Yes    Pain Score 3     Pain Location Shoulder    Pain Orientation Left    Pain Descriptors / Indicators Aching    Pain Type Acute pain    Pain Onset More than a month ago    Pain Frequency Intermittent    Aggravating Factors  reaching    Pain Relieving Factors rest                          OT Treatments/Exercises (OP) - 05/10/21 1227       Neurological Re-education Exercises   Other Exercises 1 Neuromuscular reeducation to address left shoulder pain.  Patient reports improvement in shoulder range of motion and reduction in pain.  Continued to work on active relaxation.  Discussed possible benefit for aquatic therapy.  Patient wants to hold off for now - but this could be beneficial to address shoulder pain.  Worked on setting up HEP to address self stretching of left shoulder girdle.  Up and over to sidelying then self  stretch through entire left arm.  Patient able to replicate.                    OT Education - 05/10/21 1225     Education Details sidelying left arm exercises    Person(s) Educated Patient    Methods Explanation;Demonstration;Tactile cues;Verbal cues;Handout    Comprehension Verbalized understanding;Returned demonstration              OT Short Term Goals - 05/10/21 1241       OT SHORT TERM GOAL #1   Title Patient will complete an HEP designed to address left shoulder pain and range of motion    Time 4    Period Weeks    Status On-going      OT SHORT TERM GOAL #2   Title Patient will demonstrate improved safety and confidence (patient report) with transferring into and out of shower.    Time 4    Period Weeks    Status On-going      OT SHORT TERM GOAL #3   Title Patient will demonstrate improved performance - decreased time,  decreased pain in donning/doffing socks    Time 4    Period Weeks    Status On-going      OT SHORT TERM GOAL #4   Title Patient will prepare simple warm meal for himself with no more than supervision    Time 4    Period Weeks    Status On-going               OT Long Term Goals - 05/10/21 1241       OT LONG TERM GOAL #1   Title Patient will complete updated HEP designed to increase range of motion, strength and functional use of non-dominant LUE    Time 8    Period Weeks    Status On-going      OT LONG TERM GOAL #2   Title Patient will return to mowing 50% of his lawn with minimal assistance    Time 8    Period Weeks    Status On-going      OT LONG TERM GOAL #3   Title Patient will return to working with hand (versus power)  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    Time 8    Period Weeks    Status On-going      OT LONG TERM GOAL #4   Title Patient will report decrease in pain with reaching patterns with LUE through mid to high reach with pain no more than 1-2/10.    Time 8    Period Weeks    Status On-going      OT LONG TERM GOAL #5   Title Patient will demonstrate 5 lb increase in left grip strength    Time 8    Period Weeks    Status On-going      OT LONG TERM GOAL #6   Title Patient will complete FOTO survey on discharge with at least 10 point improvement (55 UE on eval)    Time 8    Period Weeks    Status On-going                   Plan - 05/10/21 1239     Clinical Impression Statement Pt showing steady improvement in LUE active movement and decreased pain.    OT Occupational Profile and History Detailed Assessment- Review of Records and additional review of physical,  cognitive, psychosocial history related to current functional performance    Occupational performance deficits (Please refer to evaluation for details): ADL's;IADL's;Leisure    Body Structure / Function / Physical Skills ADL;Decreased knowledge of use of  DME;Strength;Balance;Dexterity;GMC;Pain;Tone;Body mechanics;Hearing;UE functional use;Cardiopulmonary status limiting activity;Endurance;IADL;ROM;Vestibular;Coordination;Flexibility;Mobility;Sensation;FMC;Decreased knowledge of precautions    Rehab Potential Good    Clinical Decision Making Several treatment options, min-mod task modification necessary    Comorbidities Affecting Occupational Performance: May have comorbidities impacting occupational performance    Modification or Assistance to Complete Evaluation  Min-Moderate modification of tasks or assist with assess necessary to complete eval    OT Frequency 2x / week    OT Duration 8 weeks    OT Treatment/Interventions Self-care/ADL training;Moist Heat;Fluidtherapy;DME and/or AE instruction;Balance training;Aquatic Therapy;Therapeutic activities;Ultrasound;Therapeutic exercise;Cognitive remediation/compensation;Neuromuscular education;Functional Mobility Training;Passive range of motion;Visual/perceptual remediation/compensation;Electrical Stimulation;Manual Therapy;Patient/family education    Plan Start checking short term goals:  Gentle range to left shoulder passive to active assisted - supine.  Continue to add to HEP PRN.  Challenge stand balance    Consulted and Agree with Plan of Care Patient;Family member/caregiver             Patient will benefit from skilled therapeutic intervention in order to improve the following deficits and impairments:   Body Structure / Function / Physical Skills: ADL, Decreased knowledge of use of DME, Strength, Balance, Dexterity, GMC, Pain, Tone, Body mechanics, Hearing, UE functional use, Cardiopulmonary status limiting activity, Endurance, IADL, ROM, Vestibular, Coordination, Flexibility, Mobility, Sensation, FMC, Decreased knowledge of precautions       Visit Diagnosis: Unsteadiness on feet  Acute pain of left shoulder  Muscle weakness (generalized)  Attention and concentration  deficit  Stiffness of left shoulder, not elsewhere classified    Problem List Patient Active Problem List   Diagnosis Date Noted   Dysphagia    Leucocytosis    Benign essential HTN    Acute gout of right knee    Acute ischemic right MCA stroke ([facility]) 03/12/2021   Alcohol abuse    Seizure ([facility])    Essential hypertension 12/26/2019   Longstanding persistent atrial fibrillation (HCC)    Atrial fibrillation with RVR ([facility]) 02/03/2019   Hyperlipidemia 02/03/2019   Anxiety 02/03/2019    Mariah Milling, OT/L 05/10/2021, 12:42 PM  [facility] [address] [place], [place], [address] Phone: [phone]   Fax:  [phone]  Name: Jahri Epperson MRN: [id number] Date of Birth: [birth year]

## 2021-05-10 NOTE — Therapy (Signed)
[facility] [address] [place], [place], [address] Phone: [phone]   Fax:  [phone]  Physical Therapy Treatment  Patient Details  Name: Patrick Padilla MRN: [id number] Date of Birth: Apr 04, 1956 No data recorded  Encounter Date: 05/10/2021   PT End of Session - 05/10/21 1049     Visit Number 5    Number of Visits 13    Date for PT Re-Evaluation 06/01/21    Authorization Type MCR/MCD    Progress Note Due on Visit 10    PT Start Time 1100    PT Stop Time 1145    PT Time Calculation (min) 45 min    Equipment Utilized During Treatment Gait belt    Activity Tolerance Patient tolerated treatment well    Behavior During Therapy WFL for tasks assessed/performed             Past Medical History:  Diagnosis Date   Anxiety    Elevated LFTs    History of colon polyps    HLD (hyperlipidemia)    HTN (hypertension)    Localized superficial swelling, mass, or lump     Past Surgical History:  Procedure Laterality Date   CARDIOVERSION N/A 02/05/2019   Procedure: CARDIOVERSION;  Surgeon: Josue Hector, MD;  Location: [facility];  Service: Cardiovascular;  Laterality: N/A;   CERVICAL DISC SURGERY     IR CT HEAD LTD  03/12/2021   IR PERCUTANEOUS ART THROMBECTOMY/INFUSION INTRACRANIAL INC DIAG ANGIO  03/12/2021   IR US GUIDE VASC ACCESS RIGHT  03/15/2021   LEG SURGERY     right leg and ankle   NECK SURGERY     cyst removed   RADIOLOGY WITH ANESTHESIA N/A 03/12/2021   Procedure: RADIOLOGY WITH ANESTHESIA;  Surgeon: Radiologist, Medication, MD;  Location: [facility];  Service: Radiology;  Laterality: N/A;   TEE WITHOUT CARDIOVERSION N/A 02/05/2019   Procedure: TRANSESOPHAGEAL ECHOCARDIOGRAM (TEE);  Surgeon: Josue Hector, MD;  Location: [facility] ENDOSCOPY;  Service: Cardiovascular;  Laterality: N/A;   TOTAL HIP ARTHROPLASTY     left    Vitals:   05/10/21 1115  BP: 118/70  Pulse: 76  SpO2: 96%     Subjective Assessment -  05/10/21 1102     Subjective Rates himself at 75% in regards to ambulation and balance .    Patient is accompained by: Family member    Pertinent History MRI+acute infarct at the right basal ganglia. Few and  punctate bifrontal peripheral infarcts. Recent medial left thalamic infarct which may be subacute. 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    Limitations Standing;Walking    How long can you sit comfortably? >15 min    How long can you stand comfortably? <15 min    How long can you walk comfortably? <15 min    Patient Stated Goals To become more steady on his feet and return to outside ambulation    Pain Onset More than a month ago                [facility] PT Assessment - 05/10/21 0001       Ambulation/Gait   Stairs Yes    Stairs Assistance 5: Supervision    Stair Management Technique One rail Right;Alternating pattern    Number of Stairs 16    Height of Stairs 6      Functional Gait  Assessment   Gait assessed  Yes    Gait Level Surface Walks 20 ft, slow  speed, abnormal gait pattern, evidence for imbalance or deviates 10-15 in outside of the 12 in walkway width. Requires more than 7 sec to ambulate 20 ft.    Change in Gait Speed Able to change speed, demonstrates mild gait deviations, deviates 6-10 in outside of the 12 in walkway width, or no gait deviations, unable to achieve a major change in velocity, or uses a change in velocity, or uses an assistive device.    Gait with Horizontal Head Turns Performs head turns smoothly with slight change in gait velocity (eg, minor disruption to smooth gait path), deviates 6-10 in outside 12 in walkway width, or uses an assistive device.    Gait with Vertical Head Turns Performs head turns with no change in gait. Deviates no more than 6 in outside 12 in walkway width.    Gait and Pivot Turn Pivot turns safely within 3 sec and stops quickly with no loss of balance.    Step Over  Obstacle Is able to step over 2 stacked shoe boxes taped together (9 in total height) without changing gait speed. No evidence of imbalance.    Gait with Narrow Base of Support Is able to ambulate for 10 steps heel to toe with no staggering.    Gait with Eyes Closed Walks 20 ft, uses assistive device, slower speed, mild gait deviations, deviates 6-10 in outside 12 in walkway width. Ambulates 20 ft in less than 9 sec but greater than 7 sec.    Ambulating Backwards Walks 20 ft, no assistive devices, good speed, no evidence for imbalance, normal gait    Steps Alternating feet, no rail.    Total Score 25                           [facility] Adult PT Treatment/Exercise - 05/10/21 0001       Transfers   Transfers Sit to Stand;Stand to Sit    Sit to Stand 6: Modified independent (Device/Increase time)    Five time sit to stand comments  13.0s    Stand to Sit 6: Modified independent (Device/Increase time)      Ambulation/Gait   Ambulation/Gait Yes    Ambulation/Gait Assistance 5: Supervision    Ambulation/Gait Assistance Details reaching up to touch parking signs    Ambulation Distance (Feet) 1200 Feet    Assistive device None    Gait Pattern Step-through pattern;Decreased arm swing - right;Decreased arm swing - left;Decreased step length - right;Decreased step length - left;Decreased dorsiflexion - right;Decreased dorsiflexion - left;Lateral hip instability    Ambulation Surface Level;Unlevel;Indoor;Outdoor;Grass      Knee/Hip Exercises: Aerobic   Stepper Scifit seat 18 arms 7 L3.5 x8 min                       PT Short Term Goals - 04/29/21 1104       PT SHORT TERM GOAL #1   Title Patient to become Ind in initial [place]; 04/29/21 Compliant with HEP    Time 3    Period Weeks    Status Achieved    Target Date 05/11/21      PT SHORT TERM GOAL #2   Title Patient to negotiate 16 steps with most appropriate pattern and support    Baseline 8  steps with alternating pattern and B rails; 04/25/21 16 steps with single rail and step through pattern    Time 3  Period Weeks    Status Achieved    Target Date 05/11/21      PT SHORT TERM GOAL #3   Title Patient to transfer STS 1x w/o UE support    Baseline Requires BUE support to stand; 04/25/21 Able to stand once arms crossed followed by 4 more with CGA    Time 3    Period Weeks    Status Achieved    Target Date 05/11/21      PT SHORT TERM GOAL #4   Title Patient to ambulate 563f across outdoor surfaces w/o need of AD    Baseline 2056findoors w/o need of AD; 04/29/21 Ambulation of 100035fcross paved and grassy surfaces w/o need of AD    Time 3    Period Weeks    Status Achieved    Target Date 06/01/21               PT Long Term Goals - 05/10/21 1126       PT LONG TERM GOAL #1   Title Patient to decreased 5x STS time to <12s w/o UE assist    Baseline 15.4s with BUE assist; 05/10/21 13.0s    Time 6    Period Weeks    Status Partially Met    Target Date 06/01/21      PT LONG TERM GOAL #2   Title Patient to increase score of FGA to 26/30    Baseline 20/30; 05/10/21 FGA score 25/30    Time 6    Period Weeks    Status On-going    Target Date 06/01/21      PT LONG TERM GOAL #3   Title Patient to negotiate 16 steps with alternting pattern w/o UE support    Baseline 8 steps with alternating pattern and B UE support; 05/10/21 Able to negotiate 16 steps with single UE support    Time 6    Period Weeks    Status Partially Met    Target Date 06/01/21      PT LONG TERM GOAL #4   Title Ambulate 1000f39fross outdoor surfaces w/o need of AD    Baseline 200ft39fclinic w/o AD; 05/10/21 1200ft 27fss sloped and grass surfaces w/o AD under S    Time 6    Period Weeks    Status New    Target Date 06/01/21      PT LONG TERM GOAL #5   Title Increase FOTO score to 71    Baseline Initial score 55    Time 6    Period Weeks    Status New                    Plan - 05/10/21 1058     Clinical Impression Statement Todays session addressed progress towards LTGs due to patient progressing ahead of schedule.  FGA score improved but goal not met.  5x STS time decreased but goal not met.  Patient now able to ambulate 1200ft w102fD to negotiate community distances.    Personal Factors and Comorbidities Comorbidity 1    Comorbidities CVA    Examination-Activity Limitations Locomotion Level;Transfers;Stairs    Stability/Clinical Decision Making Stable/Uncomplicated    Rehab Potential Good    PT Frequency 2x / week    PT Duration 6 weeks    PT Treatment/Interventions ADLs/Self Care Home Management;Aquatic Therapy;DME Instruction;Gait training;Stair training;Functional mobility training;Therapeutic activities;Balance training;Neuromuscular re-education;Therapeutic exercise;Patient/family education    PT Next Visit Plan LE strengthening and balance training  with head turns, stepping tasks, head motions during balance and gait to resolve dizziness, transfers from compliant surfaces    PT Home Exercise Plan [facility]             Patient will benefit from skilled therapeutic intervention in order to improve the following deficits and impairments:  Abnormal gait, Difficulty walking, Decreased endurance, Decreased activity tolerance, Decreased balance, Decreased strength  Visit Diagnosis: Unsteadiness on feet  Muscle weakness (generalized)  Balance disorder     Problem List Patient Active Problem List   Diagnosis Date Noted   Dysphagia    Leucocytosis    Benign essential HTN    Acute gout of right knee    Acute ischemic right MCA stroke ([facility]) 03/12/2021   Alcohol abuse    Seizure ([place])    Essential hypertension 12/26/2019   Longstanding persistent atrial fibrillation ([facility])    Atrial fibrillation with RVR ([facility]) 02/03/2019   Hyperlipidemia 02/03/2019   Anxiety 02/03/2019    Lanice Shirts, PT 05/10/2021, 11:40 AM  [facility] [address] [place], [place], [address] Phone: [phone]   Fax:  [phone]  Name: Athan Casalino MRN: [id number] Date of Birth: Jun 05, 1956

## 2021-05-10 NOTE — Therapy (Signed)
[facility] [address] [place], [place], [address] Phone: [phone]   Fax:  [phone]  Speech Language Pathology Treatment  Patient Details  Name: Patrick Padilla MRN: [id number] Date of Birth: 06-24-1956 Referring Provider (SLP): Patrick Parsons, PA-C   Encounter Date: 05/10/2021   End of Session - 05/10/21 1512     Visit Number 2    Number of Visits 17    Date for SLP Re-Evaluation 06/10/21    SLP Start Time 1017    SLP Stop Time  1100    SLP Time Calculation (min) 43 min             Past Medical History:  Diagnosis Date   Anxiety    Elevated LFTs    History of colon polyps    HLD (hyperlipidemia)    HTN (hypertension)    Localized superficial swelling, mass, or lump     Past Surgical History:  Procedure Laterality Date   CARDIOVERSION N/A 02/05/2019   Procedure: CARDIOVERSION;  Surgeon: Patrick Hector, MD;  Location: [facility];  Service: Cardiovascular;  Laterality: N/A;   CERVICAL DISC SURGERY     IR CT HEAD LTD  03/12/2021   IR PERCUTANEOUS ART THROMBECTOMY/INFUSION INTRACRANIAL INC DIAG ANGIO  03/12/2021   IR US GUIDE VASC ACCESS RIGHT  03/15/2021   LEG SURGERY     right leg and ankle   NECK SURGERY     cyst removed   RADIOLOGY WITH ANESTHESIA N/A 03/12/2021   Procedure: RADIOLOGY WITH ANESTHESIA;  Surgeon: Radiologist, Medication, MD;  Location: [facility];  Service: Radiology;  Laterality: N/A;   TEE WITHOUT CARDIOVERSION N/A 02/05/2019   Procedure: TRANSESOPHAGEAL ECHOCARDIOGRAM (TEE);  Surgeon: Patrick Hector, MD;  Location: [facility] ENDOSCOPY;  Service: Cardiovascular;  Laterality: N/A;   TOTAL HIP ARTHROPLASTY     left    There were no vitals filed for this visit.   Subjective Assessment - 05/10/21 1023     Subjective "It subsided today"    Currently in Pain? Yes    Pain Score 4     Pain Location Shoulder    Pain Orientation Left    Pain Descriptors / Indicators Aching                    ADULT SLP TREATMENT - 05/10/21 1037       General Information   Behavior/Cognition Alert;Cooperative;Pleasant mood      Treatment Provided   Treatment provided Cognitive-Linquistic      Cognitive-Linquistic Treatment   Treatment focused on Cognition;Patient/family/caregiver education    Skilled Treatment Patrick Padilla continues to have his daughter in law remind him to take his meds. We generated a strategy of placing his pill organizer next to his recliner and using his phone to check to day of the week to A in recall of am meds.Patrick Padilla note home to have daughter help Patrick Padilla follow through with this. He is not doing household chores, but states he sits in his recliner and isn't motivated. Educated this is normal after stroke, due to pain and poor sleep schedule. Patrick Padilla and sent note home for carryover. Targeted attention and executive function generating 3 dinner meals with sides and grocery list to go with each meal. Patrick Padilla generated list of 20 ingridients with rare min quesitoning cues and 1 redirection to task.      Assessment / Recommendations / Plan   Plan Continue with current plan of care  Progression Toward Goals   Progression toward goals Progressing toward goals              SLP Education - 05/10/21 1508     Education Details memory compensations    Person(s) Educated Patient    Methods Explanation;Demonstration;Handout;Verbal cues    Comprehension Verbalized understanding;Returned demonstration;Need further instruction              SLP Short Term Goals - 05/10/21 1511       SLP SHORT TERM GOAL #1   Title Pt will use calendar to identify and manage appointments given occasional mod A over 3 sessions    Time 4    Period Weeks    Status On-going    Target Date 05/13/21      SLP SHORT TERM GOAL #2   Title Pt will recall to take medications with use of external aids given occasional min A over 3 sessions    Time 4    Period Weeks    Status  On-going    Target Date 05/13/21      SLP SHORT TERM GOAL #3   Title Pt will problem solve possible or encountered household scenarios to optimize completion of daily tasks given occasional min A over 2 sessions    Time 4    Period Weeks    Status On-going    Target Date 05/13/21      SLP SHORT TERM GOAL #4   Title Pt will initiate completion of 1-2 household tasks per day given occasional mod A from family over 2 sessions    Time 4    Period Weeks    Status On-going    Target Date 05/13/21      SLP SHORT TERM GOAL #5   Title Pt/caregiver will complete cognitive PROM in first few ST sessions    Time 4    Period Weeks    Status On-going    Target Date 05/13/21              SLP Long Term Goals - 05/10/21 1512       SLP LONG TERM GOAL #1   Title Pt will use memory compensations for managment of appointments, medications, and daily tasks given occasional min A over 2 sessions    Time 8    Period Weeks    Status On-going      SLP LONG TERM GOAL #2   Title Pt will verbalize and demonstrate appropriate problem solving to improve completion of household tasks given rare min A over 2 sessions    Time 8    Period Weeks    Status On-going      SLP LONG TERM GOAL #3   Title Pt/caregiver will report less reliance on family to complete previously independent household tasks given occasional min A over 2 sessions    Time 8    Period Weeks    Status On-going              Plan - 05/10/21 Patrick Padilla continues to present with 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 He continues to rely on daughter in law for medication and schedule management. Initiated strategies to improve his independence with these tasks. Patrick Padilla reports he is inconsisently doing HEP for PT/OT and low motivation. The CLQT needs to be completed next 1-2 sessions. Continue skilled ST to maximize cognition for independence, safety, and reduce caregiver burden from  daughter in law who has an  infant at home as well as caring for Patrick Padilla.    Speech Therapy Frequency 2x / week    Duration 8 weeks    Treatment/Interventions Compensatory strategies;Functional tasks;Patient/family education;Language facilitation;Compensatory techniques;Internal/external aids;SLP instruction and feedback;Cognitive reorganization;Environmental controls    Potential to Achieve Goals Fair    Potential Considerations Cooperation/participation level;Previous level of function             Patient will benefit from skilled therapeutic intervention in order to improve the following deficits and impairments:   Cognitive communication deficit    Problem List Patient Active Problem List   Diagnosis Date Noted   Dysphagia    Leucocytosis    Benign essential HTN    Acute gout of right knee    Acute ischemic right MCA stroke (HCC) 03/12/2021   Alcohol abuse    Seizure (HCC)    Essential hypertension 12/26/2019   Longstanding persistent atrial fibrillation (HCC)    Atrial fibrillation with RVR (HCC) 02/03/2019   Hyperlipidemia 02/03/2019   Anxiety 02/03/2019    Vale Peraza, Radene Journey MS, CCC-SLP 05/10/2021, 3:13 PM  [facility] [place] [address] [place], [place], [address] Phone: [phone]   Fax:  [phone]   Name: Dejan Angert MRN: [id number] Date of Birth: 04/10/1956

## 2021-05-13 ENCOUNTER — Ambulatory Visit: Payer: Medicare Other

## 2021-05-13 ENCOUNTER — Ambulatory Visit: Payer: Medicare Other | Admitting: Physical Therapy

## 2021-05-13 ENCOUNTER — Ambulatory Visit: Payer: Medicare Other | Admitting: Occupational Therapy

## 2021-05-13 ENCOUNTER — Encounter: Payer: Self-pay | Admitting: Occupational Therapy

## 2021-05-13 ENCOUNTER — Other Ambulatory Visit: Payer: Self-pay

## 2021-05-13 ENCOUNTER — Encounter: Payer: Self-pay | Admitting: Physical Therapy

## 2021-05-13 DIAGNOSIS — M6281 Muscle weakness (generalized): Secondary | ICD-10-CM

## 2021-05-13 DIAGNOSIS — R41841 Cognitive communication deficit: Secondary | ICD-10-CM

## 2021-05-13 DIAGNOSIS — M25612 Stiffness of left shoulder, not elsewhere classified: Secondary | ICD-10-CM | POA: Diagnosis not present

## 2021-05-13 DIAGNOSIS — R2689 Other abnormalities of gait and mobility: Secondary | ICD-10-CM | POA: Diagnosis not present

## 2021-05-13 DIAGNOSIS — M25512 Pain in left shoulder: Secondary | ICD-10-CM | POA: Diagnosis not present

## 2021-05-13 DIAGNOSIS — R2681 Unsteadiness on feet: Secondary | ICD-10-CM

## 2021-05-13 DIAGNOSIS — R41840 Attention and concentration deficit: Secondary | ICD-10-CM

## 2021-05-13 NOTE — Therapy (Signed)
[facility] [address] [place], [place], [address] Phone: [phone]   Fax:  [phone]  Occupational Therapy Treatment  Patient Details  Name: Patrick Padilla MRN: [id number] Date of Birth: [birth year] Referring Provider (OT): [place]   Encounter Date: 05/13/2021   OT End of Session - 05/13/21 0924     Visit Number 6    Number of Visits 17    Date for OT Re-Evaluation 06/27/21    Authorization Type Medicare A&B, 20% coinsurance    Authorization - Visit Number 5    Progress Note Due on Visit 10    OT Start Time [phone]    OT Stop Time 1008    OT Time Calculation (min) 40 min    Activity Tolerance Patient tolerated treatment well    Behavior During Therapy WFL for tasks assessed/performed             Past Medical History:  Diagnosis Date   Anxiety    Elevated LFTs    History of colon polyps    HLD (hyperlipidemia)    HTN (hypertension)    Localized superficial swelling, mass, or lump     Past Surgical History:  Procedure Laterality Date   CARDIOVERSION N/A 02/05/2019   Procedure: CARDIOVERSION;  Surgeon: Josue Hector, MD;  Location: [facility];  Service: Cardiovascular;  Laterality: N/A;   CERVICAL DISC SURGERY     IR CT HEAD LTD  03/12/2021   IR PERCUTANEOUS ART THROMBECTOMY/INFUSION INTRACRANIAL INC DIAG ANGIO  03/12/2021   IR US GUIDE VASC ACCESS RIGHT  03/15/2021   LEG SURGERY     right leg and ankle   NECK SURGERY     cyst removed   RADIOLOGY WITH ANESTHESIA N/A 03/12/2021   Procedure: RADIOLOGY WITH ANESTHESIA;  Surgeon: Radiologist, Medication, MD;  Location: [facility];  Service: Radiology;  Laterality: N/A;   TEE WITHOUT CARDIOVERSION N/A 02/05/2019   Procedure: TRANSESOPHAGEAL ECHOCARDIOGRAM (TEE);  Surgeon: Josue Hector, MD;  Location: [facility] ENDOSCOPY;  Service: Cardiovascular;  Laterality: N/A;   TOTAL HIP ARTHROPLASTY     left    There were no vitals filed for this visit.   Subjective  Assessment - 05/13/21 0927     Subjective  I slept about 5 hours    Pertinent History HTN, Afib, Anxiety, Multiple ortho injuries    Currently in Pain? Yes    Pain Score 2     Pain Location Shoulder    Pain Orientation Left    Pain Descriptors / Indicators Aching    Pain Type Acute pain    Pain Onset More than a month ago    Pain Frequency Intermittent                          OT Treatments/Exercises (OP) - 05/13/21 0942       Exercises   Exercises Shoulder      Neurological Re-education Exercises   Other Information verbal and tactile cues for relaxing scaps with reach    Other Exercises 1 UE Ranger in standing with reaching superiorly with LUE x 10 and horizontal abduction x 10, Spring Rod x 10 reps x Scap protraction/retraction and lat pull downs reverse    Other Exercises 2 Physioball for abduction stretch in sitting, seated shoulder flexion low level with verbal cues and tactile cues for keeping shoulders down.   Shoulder Rolls at end of session for increasing range of motion and trying to relax  muscles after working.     Functional Reaching Activities   Mid Level Resistance clothespins 1-8# with LUE for mid to high level reaching with LUE with min verbal/visual cues for awareness into relaxing shoulder and reducing tension  High Level Reach with RUE to place 1 inch blocks on top of mirror and with dynamic step for increased dynamic balance and weight shifts and trunk rotation. Brought blocks down with LUE with emphasis on forward reaching vs bringing arm into abduction and around for reach. No LOB during activity.                      OT Short Term Goals - 05/13/21 0940       OT SHORT TERM GOAL #1   Title Patient will complete an HEP designed to address left shoulder pain and range of motion    Time 4    Period Weeks    Status On-going      OT SHORT TERM GOAL #2   Title Patient will demonstrate improved safety and confidence (patient  report) with transferring into and out of shower.    Time 4    Period Weeks    Status On-going      OT SHORT TERM GOAL #3   Title Patient will demonstrate improved performance - decreased time, decreased pain in donning/doffing socks    Time 4    Period Weeks    Status Achieved   pt reports this is much better 05/13/21     OT SHORT TERM GOAL #4   Title Patient will prepare simple warm meal for himself with no more than supervision    Time 4    Period Weeks    Status On-going               OT Long Term Goals - 05/10/21 1241       OT LONG TERM GOAL #1   Title Patient will complete updated HEP designed to increase range of motion, strength and functional use of non-dominant LUE    Time 8    Period Weeks    Status On-going      OT LONG TERM GOAL #2   Title Patient will return to mowing 50% of his lawn with minimal assistance    Time 8    Period Weeks    Status On-going      OT LONG TERM GOAL #3   Title Patient will return to working with hand (versus power)  tools for household maintenance or work in Patent attorney.    Time 8    Period Weeks    Status On-going      OT LONG TERM GOAL #4   Title Patient will report decrease in pain with reaching patterns with LUE through mid to high reach with pain no more than 1-2/10.    Time 8    Period Weeks    Status On-going      OT LONG TERM GOAL #5   Title Patient will demonstrate 5 lb increase in left grip strength    Time 8    Period Weeks    Status On-going      OT LONG TERM GOAL #6   Title Patient will complete FOTO survey on discharge with at least 10 point improvement (55 UE on eval)    Time 8    Period Weeks    Status On-going  Patient will benefit from skilled therapeutic intervention in order to improve the following deficits and impairments:           Visit Diagnosis: Unsteadiness on feet  Muscle weakness (generalized)  Acute pain of left shoulder  Attention and  concentration deficit  Stiffness of left shoulder, not elsewhere classified    Problem List Patient Active Problem List   Diagnosis Date Noted   Dysphagia    Leucocytosis    Benign essential HTN    Acute gout of right knee    Acute ischemic right MCA stroke (HCC) 03/12/2021   Alcohol abuse    Seizure (HCC)    Essential hypertension 12/26/2019   Longstanding persistent atrial fibrillation (HCC)    Atrial fibrillation with RVR (HCC) 02/03/2019   Hyperlipidemia 02/03/2019   Anxiety 02/03/2019    Junious Dresser, OT/L 05/13/2021, 11:30 AM  [facility] [address] [place], [place], [address] Phone: [phone]   Fax:  [phone]  Name: Diontae Route MRN: [id number] Date of Birth: 05/07/1956

## 2021-05-13 NOTE — Therapy (Signed)
[facility] [address] [place], [place], [address] Phone: [phone]   Fax:  [phone]  Speech Language Pathology Treatment  Patient Details  Name: Patrick Padilla MRN: [id number] Date of Birth: [birth year] Referring Provider (SLP): Cathlyn Parsons, PA-C   Encounter Date: 05/13/2021   End of Session - 05/13/21 1422     Visit Number 3    Number of Visits 17    Date for SLP Re-Evaluation 06/10/21    SLP Start Time 1105    SLP Stop Time  1145    SLP Time Calculation (min) 40 min    Activity Tolerance Patient tolerated treatment well             Past Medical History:  Diagnosis Date   Anxiety    Elevated LFTs    History of colon polyps    HLD (hyperlipidemia)    HTN (hypertension)    Localized superficial swelling, mass, or lump     Past Surgical History:  Procedure Laterality Date   CARDIOVERSION N/A 02/05/2019   Procedure: CARDIOVERSION;  Surgeon: Josue Hector, MD;  Location: [facility];  Service: Cardiovascular;  Laterality: N/A;   CERVICAL DISC SURGERY     IR CT HEAD LTD  03/12/2021   IR PERCUTANEOUS ART THROMBECTOMY/INFUSION INTRACRANIAL INC DIAG ANGIO  03/12/2021   IR US GUIDE VASC ACCESS RIGHT  03/15/2021   LEG SURGERY     right leg and ankle   NECK SURGERY     cyst removed   RADIOLOGY WITH ANESTHESIA N/A 03/12/2021   Procedure: RADIOLOGY WITH ANESTHESIA;  Surgeon: Radiologist, Medication, MD;  Location: [facility];  Service: Radiology;  Laterality: N/A;   TEE WITHOUT CARDIOVERSION N/A 02/05/2019   Procedure: TRANSESOPHAGEAL ECHOCARDIOGRAM (TEE);  Surgeon: Josue Hector, MD;  Location: [facility] ENDOSCOPY;  Service: Cardiovascular;  Laterality: N/A;   TOTAL HIP ARTHROPLASTY     left    There were no vitals filed for this visit.   Subjective Assessment - 05/13/21 1108     Subjective "I forgot one thing - noodles."    Currently in Pain? No/denies                   ADULT SLP TREATMENT -  05/13/21 1109       General Information   Behavior/Cognition Alert;Cooperative;Pleasant mood      Treatment Provided   Treatment provided Cognitive-Linquistic      Cognitive-Linquistic Treatment   Treatment focused on Cognition;Patient/family/caregiver education    Skilled Treatment Pt recalled he was working on balance in PT today Patrick Padilla) and with his shoulder in OT (cues to come up with [place]). Pt complaining of how little he is sleeping; SLP suggested pt keep track of this each night, pt wanted to write it down on his desk calendar. "I'll just remember," was pt's response when SLP asked pt how he was going to remember to do this for each night. SLP used question cues to assist pt to reason through if "just remembering" would work. Pt ultimately decided with SLP assistance   that he should also put reminder in his phone for tracking slept hours.      Assessment / Recommendations / Plan   Plan Continue with current plan of care      Progression Toward Goals   Progression toward goals Progressing toward goals              SLP Education - 05/13/21 1422     Education Details  setting alarms for meds and for tracking hours slept    Person(s) Educated Patient    Methods Explanation    Comprehension Verbalized understanding;Need further instruction              SLP Short Term Goals - 05/13/21 1426       SLP SHORT TERM GOAL #1   Title Pt will use calendar to identify and manage appointments given occasional mod A over 3 sessions    Status Not Met    Target Date 05/13/21      SLP SHORT TERM GOAL #2   Title Pt will recall to take medications with use of external aids given occasional min A over 3 sessions    Status Not Met    Target Date 05/13/21      SLP SHORT TERM GOAL #3   Title Pt will problem solve possible or encountered household scenarios to optimize completion of daily tasks given occasional min A over 2 sessions    Status Not Met    Target Date 05/13/21       SLP SHORT TERM GOAL #4   Title Pt will initiate completion of 1-2 household tasks per day given occasional mod A from family over 2 sessions    Status Not Met    Target Date 05/13/21      SLP SHORT TERM GOAL #5   Title Pt/caregiver will complete cognitive PROM in first few ST sessions    Time 4    Period Weeks    Status On-going    Target Date 05/13/21              SLP Long Term Goals - 05/13/21 1426       SLP LONG TERM GOAL #1   Title Pt will use memory compensations for managment of appointments, medications, and daily tasks given occasional min A over 2 sessions    Time 8    Period Weeks    Status On-going    Target Date 06/10/21      SLP LONG TERM GOAL #2   Title Pt will verbalize and demonstrate appropriate problem solving to improve completion of household tasks given rare min A over 2 sessions    Time 8    Period Weeks    Status On-going    Target Date 06/10/21      SLP LONG TERM GOAL #3   Title Pt/caregiver will report less reliance on family to complete previously independent household tasks given occasional min A over 2 sessions    Time 8    Period Weeks    Status On-going    Target Date 06/10/21              Plan - 05/13/21 1425     Clinical Impression Statement Patrick Padilla continues to present with 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 He continues to rely on daughter in law for medication and schedule management athough today told SLP he took his AM meds independently. Initiated strategies to improve his independence with these tasks. Patrick Padilla reports he is inconsisently doing HEP for PT/OT and low motivation. The CLQT needs to be completed next session. Continue skilled ST to maximize cognition for independence, safety, and reduce caregiver burden from daughter in law who has an infant at home as well as caring for Patrick Padilla.    Speech Therapy Frequency 2x / week    Duration 8 weeks    Treatment/Interventions Compensatory strategies;Functional  tasks;Patient/family education;Language facilitation;Compensatory techniques;Internal/external aids;SLP instruction and feedback;Cognitive reorganization;Environmental  controls    Potential to Achieve Goals Fair    Potential Considerations Cooperation/participation level;Previous level of function             Patient will benefit from skilled therapeutic intervention in order to improve the following deficits and impairments:   Cognitive communication deficit    Problem List Patient Active Problem List   Diagnosis Date Noted   Dysphagia    Leucocytosis    Benign essential HTN    Acute gout of right knee    Acute ischemic right MCA stroke ([facility]) 03/12/2021   Alcohol abuse    Seizure ([facility] [facility])    Essential hypertension 12/26/2019   Longstanding persistent atrial fibrillation ([facility])    Atrial fibrillation with RVR ([facility]) 02/03/2019   Hyperlipidemia 02/03/2019   Anxiety 02/03/2019    [place] ,[place], [place]  05/13/2021, 2:28 PM  [facility] [address] [name], [place], [address] Phone: [phone]   Fax:  [phone]   Name: Patrick Padilla MRN: [id number] Date of Birth: [birth year]

## 2021-05-13 NOTE — Patient Instructions (Signed)
Set alarms for your medication, and to track your hours slept each night (on your calendar)

## 2021-05-15 NOTE — Therapy (Signed)
[facility] [address] [place], [place], [address] Phone: [phone]   Fax:  [phone]  Physical Therapy Treatment  Patient Details  Name: Patrick Padilla MRN: [id number] Date of Birth: [birth year] No data recorded  Encounter Date: 05/13/2021    05/13/21 1022  PT Visits / Re-Eval  Visit Number 6  Number of Visits 13  Date for PT Re-Evaluation 06/01/21  Authorization  Authorization Type MCR/MCD  Progress Note Due on Visit 10  PT Time Calculation  PT Start Time 1018  PT Stop Time 1100  PT Time Calculation (min) 42 min  PT - End of Session  Equipment Utilized During Treatment Gait belt  Activity Tolerance Patient tolerated treatment well  Behavior During Therapy WFL for tasks assessed/performed    Past Medical History:  Diagnosis Date   Anxiety    Elevated LFTs    History of colon polyps    HLD (hyperlipidemia)    HTN (hypertension)    Localized superficial swelling, mass, or lump     Past Surgical History:  Procedure Laterality Date   CARDIOVERSION N/A 02/05/2019   Procedure: CARDIOVERSION;  Surgeon: Josue Hector, MD;  Location: [facility];  Service: Cardiovascular;  Laterality: N/A;   CERVICAL DISC SURGERY     IR CT HEAD LTD  03/12/2021   IR PERCUTANEOUS ART THROMBECTOMY/INFUSION INTRACRANIAL INC DIAG ANGIO  03/12/2021   IR US GUIDE VASC ACCESS RIGHT  03/15/2021   LEG SURGERY     right leg and ankle   NECK SURGERY     cyst removed   RADIOLOGY WITH ANESTHESIA N/A 03/12/2021   Procedure: RADIOLOGY WITH ANESTHESIA;  Surgeon: Radiologist, Medication, MD;  Location: [facility];  Service: Radiology;  Laterality: N/A;   TEE WITHOUT CARDIOVERSION N/A 02/05/2019   Procedure: TRANSESOPHAGEAL ECHOCARDIOGRAM (TEE);  Surgeon: Josue Hector, MD;  Location: [facility] ENDOSCOPY;  Service: Cardiovascular;  Laterality: N/A;   TOTAL HIP ARTHROPLASTY     left    There were no vitals filed for this visit.    05/13/21 1021   Symptoms/Limitations  Subjective No falls or pain. Does report feeling increased left LE weakness after long walk last session.  Pertinent History MRI+acute infarct at the right basal ganglia. Few and  punctate bifrontal peripheral infarcts. Recent medial left thalamic infarct which may be subacute. 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  Limitations Standing;Walking  How long can you sit comfortably? >15 min  How long can you stand comfortably? <15 min  How long can you walk comfortably? <15 min  Patient Stated Goals To become more steady on his feet and return to outside ambulation  Pain Assessment  Currently in Pain? No/denies  Pain Score 0       05/13/21 1022  Transfers  Transfers Sit to Stand;Stand to Sit  Sit to Stand 6: Modified independent (Device/Increase time)  Stand to Sit 6: Modified independent (Device/Increase time)  Ambulation/Gait  Ambulation/Gait Yes  Ambulation/Gait Assistance 5: Supervision;4: Min guard  Ambulation Distance (Feet)  (around clinic with session)  Assistive device None  Gait Pattern Step-through pattern;Decreased arm swing - right;Decreased arm swing - left;Decreased step length - right;Decreased step length - left;Decreased dorsiflexion - right;Decreased dorsiflexion - left;Lateral hip instability  Ambulation Surface Level;Indoor  Neuro Re-ed   Neuro Re-ed Details  for balance/muscle re-ed: gait around track working on scanning all directions randomly, speed changes, sudden stops/starts with min guard assist, no balance issues noted.  Knee/Hip Exercises: Aerobic  Stepper  Scifit UE/LE level 3.5 x 6 minutes with goal >/= 60 steps per minute for strengthening and activity tolerance.        05/13/21 1034  Balance Exercises: Standing  Rockerboard Anterior/posterior;Lateral;EO;EC;30 seconds;Other reps (comment);Intermittent UE support;Limitations  Rockerboard Limitations performed both  ways on balance board: rocking the board with EO, progressing to EC with emphasis on tall posture and weight shifting. Then holding the board steady with EC 30 sec's x 3 reps. min guard to min assist for balance.  Balance Beam standing across blue foam beam: No UE support for alternating forward stepping to floor/back onto beam, then alternating backward stepping to floor/back onto beam with cues on step length/height. min guard to min assist for balance  Tandem Gait Forward;Retro;Upper extremity support;Foam/compliant surface;3 reps;Limitations  Tandem Gait Limitations on blue foam beam with light UE support on bars, cues for step placement and posture  Sidestepping Foam/compliant support;Upper extremity support;3 reps;Limitations  Sidestepping Limitations on blue foam beam light to no UE support on bar with cues on step lenth and posture.           PT Short Term Goals - 04/29/21 1104       PT SHORT TERM GOAL #1   Title Patient to become Ind in initial Cool; 04/29/21 Compliant with HEP    Time 3    Period Weeks    Status Achieved    Target Date 05/11/21      PT SHORT TERM GOAL #2   Title Patient to negotiate 16 steps with most appropriate pattern and support    Baseline 8 steps with alternating pattern and B rails; 04/25/21 16 steps with single rail and step through pattern    Time 3    Period Weeks    Status Achieved    Target Date 05/11/21      PT SHORT TERM GOAL #3   Title Patient to transfer STS 1x w/o UE support    Baseline Requires BUE support to stand; 04/25/21 Able to stand once arms crossed followed by 4 more with CGA    Time 3    Period Weeks    Status Achieved    Target Date 05/11/21      PT SHORT TERM GOAL #4   Title Patient to ambulate 511f across outdoor surfaces w/o need of AD    Baseline 2023findoors w/o need of AD; 04/29/21 Ambulation of 100059fcross paved and grassy surfaces w/o need of AD    Time 3    Period Weeks    Status  Achieved    Target Date 06/01/21               PT Long Term Goals - 05/10/21 1126       PT LONG TERM GOAL #1   Title Patient to decreased 5x STS time to <12s w/o UE assist    Baseline 15.4s with BUE assist; 05/10/21 13.0s    Time 6    Period Weeks    Status Partially Met    Target Date 06/01/21      PT LONG TERM GOAL #2   Title Patient to increase score of FGA to 26/30    Baseline 20/30; 05/10/21 FGA score 25/30    Time 6    Period Weeks    Status On-going    Target Date 06/01/21      PT LONG TERM GOAL #3   Title Patient to negotiate 16 steps with alternting pattern w/o UE  support    Baseline 8 steps with alternating pattern and B UE support; 05/10/21 Able to negotiate 16 steps with single UE support    Time 6    Period Weeks    Status Partially Met    Target Date 06/01/21      PT LONG TERM GOAL #4   Title Ambulate 1018f across outdoor surfaces w/o need of AD    Baseline 2072fin clinic w/o AD; 05/10/21 120018fcross sloped and grass surfaces w/o AD under S    Time 6    Period Weeks    Status New    Target Date 06/01/21      PT LONG TERM GOAL #5   Title Increase FOTO score to 71    Baseline Initial score 55    Time 6    Period Weeks    Status New              05/13/21 1022  Plan  Clinical Impression Statement Today's skilled session continued to focus on strengthening and balance training with no issues noted or reported in session. The pt is making steady progress toward goals and should benefit from continued PT to progress toward unmet goals.  Personal Factors and Comorbidities Comorbidity 1  Comorbidities CVA  Examination-Activity Limitations Locomotion Level;Transfers;Stairs  Pt will benefit from skilled therapeutic intervention in order to improve on the following deficits Abnormal gait;Difficulty walking;Decreased endurance;Decreased activity tolerance;Decreased balance;Decreased strength  Stability/Clinical Decision Making Stable/Uncomplicated   Rehab Potential Good  PT Frequency 2x / week  PT Duration 6 weeks  PT Treatment/Interventions ADLs/Self Care Home Management;Aquatic Therapy;DME Instruction;Gait training;Stair training;Functional mobility training;Therapeutic activities;Balance training;Neuromuscular re-education;Therapeutic exercise;Patient/family education  PT Next Visit Plan LE strengthening and balance training with head turns, stepping tasks, head motions during balance and gait to resolve dizziness, transfers from compliant surfaces  PT Home Exercise Plan EV8[facility]       Patient will benefit from skilled therapeutic intervention in order to improve the following deficits and impairments:  Abnormal gait, Difficulty walking, Decreased endurance, Decreased activity tolerance, Decreased balance, Decreased strength  Visit Diagnosis: Unsteadiness on feet  Muscle weakness (generalized)     Problem List Patient Active Problem List   Diagnosis Date Noted   Dysphagia    Leucocytosis    Benign essential HTN    Acute gout of right knee    Acute ischemic right MCA stroke (HCCLa Gr[place]9/09/2020   Alcohol abuse    Seizure (HCC[place]  Essential hypertension 12/26/2019   Longstanding persistent atrial fibrillation (HCCPowhattan  Atrial fibrillation with RVR (HCCDevola7/27/2020   Hyperlipidemia 02/03/2019   Anxiety 02/03/2019    KatWillow OraTA, CLTKaiser Fnd Hosp-Modestotpatient Neuro RehGateways Hospital And Mental [facility] Center[address]uiLaieeMcEwensvilleC [phone]/12/30, 2:41 PM   Name: JoeMalikiah DebarrN: [id number] of Birth: [birth year]

## 2021-05-16 ENCOUNTER — Ambulatory Visit (INDEPENDENT_AMBULATORY_CARE_PROVIDER_SITE_OTHER): Payer: Medicare Other | Admitting: Family Medicine

## 2021-05-16 ENCOUNTER — Other Ambulatory Visit: Payer: Self-pay

## 2021-05-16 VITALS — BP 86/55 | HR 83 | Temp 98.1°F | Resp 16 | Ht 72.0 in | Wt 199.0 lb

## 2021-05-16 DIAGNOSIS — Z7689 Persons encountering health services in other specified circumstances: Secondary | ICD-10-CM | POA: Diagnosis not present

## 2021-05-16 DIAGNOSIS — F419 Anxiety disorder, unspecified: Secondary | ICD-10-CM | POA: Diagnosis not present

## 2021-05-16 DIAGNOSIS — I10 Essential (primary) hypertension: Secondary | ICD-10-CM | POA: Diagnosis not present

## 2021-05-16 MED ORDER — PAROXETINE HCL 30 MG PO TABS: 30.0000 mg | ORAL_TABLET | Freq: Every day | ORAL | 0 refills | Status: DC

## 2021-05-16 NOTE — Progress Notes (Signed)
Patient is here to est care with provider. Patient has no new concerns

## 2021-05-17 ENCOUNTER — Encounter: Payer: Self-pay | Admitting: Family Medicine

## 2021-05-17 NOTE — Progress Notes (Signed)
New Patient Office Visit  Subjective:  Patient ID: Patrick Padilla, male    DOB: [birth year]  Age: 65 y.o. MRN: [id number]  CC:  Chief Complaint  Patient presents with   Establish Care    HPI Patrick Padilla presents for to establish care and for review of chronic med issues including hypertension and anxiety.   Past Medical History:  Diagnosis Date   Anxiety    Elevated LFTs    History of colon polyps    HLD (hyperlipidemia)    HTN (hypertension)    Localized superficial swelling, mass, or lump     Past Surgical History:  Procedure Laterality Date   CARDIOVERSION N/A 02/05/2019   Procedure: CARDIOVERSION;  Surgeon: Wendall Stade, MD;  Location: [facility] ENDOSCOPY;  Service: Cardiovascular;  Laterality: N/A;   CERVICAL DISC SURGERY     IR CT HEAD LTD  03/12/2021   IR PERCUTANEOUS ART THROMBECTOMY/INFUSION INTRACRANIAL INC DIAG ANGIO  03/12/2021   IR US GUIDE VASC ACCESS RIGHT  03/15/2021   LEG SURGERY     right leg and ankle   NECK SURGERY     cyst removed   RADIOLOGY WITH ANESTHESIA N/A 03/12/2021   Procedure: RADIOLOGY WITH ANESTHESIA;  Surgeon: Radiologist, Medication, MD;  [facility];  Service: Radiology;  Laterality: N/A;   TEE WITHOUT CARDIOVERSION N/A 02/05/2019   Procedure: TRANSESOPHAGEAL ECHOCARDIOGRAM (TEE);  Surgeon: Wendall Stade, MD;  Location[facility] ENDOSCOPY;  Service: Cardiovascular;  Laterality: N/A;   TOTAL HIP ARTHROPLASTY     left    Family History  Problem Relation Age of Onset   Hepatitis Mother    Clotting disorder Mother    Stroke Mother    Thyroid disease Mother    Colon cancer Father        ?   Rheumatic fever Brother    Heart disease Brother     Social History   Socioeconomic History   Marital status: Married    Spouse name: Not on file   Number of children: 2   Years of education: Not on file   Highest education level: Not on file  Occupational History   Occupation: disabled    Employer: SELF-EMPLOYED  Tobacco Use   Smoking status: Every  Day   Smokeless tobacco: Never  Vaping Use   Vaping Use: Never used  Substance and Sexual Activity   Alcohol use: Yes    Comment: 3-4 a day   Drug use: No   Sexual activity: Not on file  Other Topics Concern   Not on file  Social History Narrative   Not on file   Social Determinants of Health   Financial Resource Strain: Not on file  Food Insecurity: Not on file  Transportation Needs: Not on file  Physical Activity: Not on file  Stress: Not on file  Social Connections: Not on file  Intimate Partner Violence: Not on file    ROS Review of Systems  Cardiovascular: Negative.   Psychiatric/Behavioral:  Negative for self-injury, sleep disturbance and suicidal ideas. The patient is nervous/anxious.   All other systems reviewed and are negative.  Objective:   Today's Vitals: BP (!) 86/55 (BP Location: Right Arm, Patient Position: Sitting, Cuff Size: Normal)   Pulse 83   Temp 98.1 F (36.7 C) (Oral)   Resp 16   Ht 6' (1.829 m)   Wt 199 lb (90.3 kg)   SpO2 94%   BMI 26.99 kg/m   Physical Exam Vitals and nursing note reviewed.  Constitutional:      General: He is not in acute distress. Cardiovascular:     Rate and Rhythm: Normal rate and regular rhythm.  Pulmonary:     Effort: Pulmonary effort is normal.     Breath sounds: Normal breath sounds.  Abdominal:     Palpations: Abdomen is soft.     Tenderness: There is no abdominal tenderness.  Musculoskeletal:     Right lower leg: No edema.     Left lower leg: No edema.  Neurological:     General: No focal deficit present.     Mental Status: He is alert and oriented to person, place, and time.  Psychiatric:        Mood and Affect: Mood normal.    Assessment & Plan:   1. Essential hypertension Low readings. Will decrease amlodipine from 10 mg daily to 5 mg daily and monitor  2. Anxiety Patient reports improvement in anxiety but is willing to increase meds. Will increase from Paxil 20 mg daily to 30 mg daily and  monitor  3. Encounter to establish care     Outpatient Encounter Medications as of 05/16/2021  Medication Sig   amLODipine (NORVASC) 10 MG tablet Take 1 tablet (10 mg total) by mouth daily.   apixaban (ELIQUIS) 5 MG TABS tablet Take 1 tablet (5 mg total) by mouth 2 (two) times daily.   atorvastatin (LIPITOR) 40 MG tablet Take 1 tablet (40 mg total) by mouth daily.   folic acid (FOLVITE) 1 MG tablet Take 1 tablet (1 mg total) by mouth daily.   irbesartan (AVAPRO) 75 MG tablet Take 1 tablet (75 mg total) by mouth daily.   metoprolol tartrate (LOPRESSOR) 100 MG tablet Take 1 tablet (100 mg total) by mouth 2 (two) times daily.   Multiple Vitamin (MULTIVITAMIN WITH MINERALS) TABS tablet Take 1 tablet by mouth daily.   PARoxetine (PAXIL) 30 MG tablet Take 1 tablet (30 mg total) by mouth daily.   polyethylene glycol (MIRALAX / GLYCOLAX) 17 g packet Take 17 g by mouth 2 (two) times daily.   senna-docusate (SENOKOT-S) 8.6-50 MG tablet Place 1 tablet into feeding tube at bedtime as needed for moderate constipation.   telmisartan (MICARDIS) 40 MG tablet Take 40 mg by mouth daily.   [DISCONTINUED] PARoxetine (PAXIL) 20 MG tablet Take 20 mg by mouth daily.   No facility-administered encounter medications on file as of 05/16/2021.    Follow-up: Return in about 4 weeks (around 06/13/2021) for follow up.   Becky Sax, MD

## 2021-05-18 ENCOUNTER — Ambulatory Visit: Payer: Medicare Other

## 2021-05-18 ENCOUNTER — Ambulatory Visit: Payer: Medicare Other | Admitting: Occupational Therapy

## 2021-05-18 ENCOUNTER — Encounter: Payer: Self-pay | Admitting: Occupational Therapy

## 2021-05-18 ENCOUNTER — Other Ambulatory Visit: Payer: Self-pay

## 2021-05-18 VITALS — BP 116/76

## 2021-05-18 DIAGNOSIS — R41840 Attention and concentration deficit: Secondary | ICD-10-CM

## 2021-05-18 DIAGNOSIS — M25612 Stiffness of left shoulder, not elsewhere classified: Secondary | ICD-10-CM | POA: Diagnosis not present

## 2021-05-18 DIAGNOSIS — R2689 Other abnormalities of gait and mobility: Secondary | ICD-10-CM | POA: Diagnosis not present

## 2021-05-18 DIAGNOSIS — R2681 Unsteadiness on feet: Secondary | ICD-10-CM | POA: Diagnosis not present

## 2021-05-18 DIAGNOSIS — M6281 Muscle weakness (generalized): Secondary | ICD-10-CM

## 2021-05-18 DIAGNOSIS — R41841 Cognitive communication deficit: Secondary | ICD-10-CM

## 2021-05-18 DIAGNOSIS — M25512 Pain in left shoulder: Secondary | ICD-10-CM | POA: Diagnosis not present

## 2021-05-18 NOTE — Therapy (Signed)
[facility] [address] [place], [place], [address] Phone: [phone]   Fax:  [phone]  Physical Therapy Treatment  Patient Details  Name: Maxi Rodas MRN: [id number] Date of Birth: April 15, 1956 No data recorded  Encounter Date: 05/18/2021   PT End of Session - 05/18/21 1142     Visit Number 7    Number of Visits 13    Date for PT Re-Evaluation 06/01/21    Authorization Type MCR/MCD    Progress Note Due on Visit 10    PT Start Time 1100    PT Stop Time 1145    PT Time Calculation (min) 45 min    Equipment Utilized During Treatment Gait belt    Activity Tolerance Patient tolerated treatment well    Behavior During Therapy WFL for tasks assessed/performed             Past Medical History:  Diagnosis Date   Anxiety    Elevated LFTs    History of colon polyps    HLD (hyperlipidemia)    HTN (hypertension)    Localized superficial swelling, mass, or lump     Past Surgical History:  Procedure Laterality Date   CARDIOVERSION N/A 02/05/2019   Procedure: CARDIOVERSION;  Surgeon: Josue Hector, MD;  Location: [facility];  Service: Cardiovascular;  Laterality: N/A;   CERVICAL DISC SURGERY     IR CT HEAD LTD  03/12/2021   IR PERCUTANEOUS ART THROMBECTOMY/INFUSION INTRACRANIAL INC DIAG ANGIO  03/12/2021   IR US GUIDE VASC ACCESS RIGHT  03/15/2021   LEG SURGERY     right leg and ankle   NECK SURGERY     cyst removed   RADIOLOGY WITH ANESTHESIA N/A 03/12/2021   Procedure: RADIOLOGY WITH ANESTHESIA;  Surgeon: Radiologist, Medication, MD;  Location: [facility];  Service: Radiology;  Laterality: N/A;   TEE WITHOUT CARDIOVERSION N/A 02/05/2019   Procedure: TRANSESOPHAGEAL ECHOCARDIOGRAM (TEE);  Surgeon: Josue Hector, MD;  Location: [facility] ENDOSCOPY;  Service: Cardiovascular;  Laterality: N/A;   TOTAL HIP ARTHROPLASTY     left    Vitals:   05/18/21 1106  BP: 116/76     Subjective Assessment - 05/18/21 1101      Subjective Has been dxed with LBP and meds were changed yesterday.  Describes 'lightheaedness" initially with standing, resolves after 10s.    Patient is accompained by: Family member    Pertinent History MRI+acute infarct at the right basal ganglia. Few and  punctate bifrontal peripheral infarcts. Recent medial left thalamic infarct which may be subacute. 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    Limitations Standing;Walking    How long can you sit comfortably? >15 min    How long can you stand comfortably? <15 min    How long can you walk comfortably? <15 min    Patient Stated Goals To become more steady on his feet and return to outside ambulation    Pain Onset More than a month ago                               [facility] Adult PT Treatment/Exercise - 05/18/21 0001       Transfers   Transfers Sit to Stand;Stand to Sit    Sit to Stand 6: Modified independent (Device/Increase time)    Stand to Sit 6: Modified independent (Device/Increase time)    Number of Reps 10 reps    Comments  from airex reaching OH when standing, needing UE support at times due to fatigue      Ambulation/Gait   Ambulation/Gait Yes    Ambulation/Gait Assistance 5: Supervision    Assistive device None    Gait Pattern Step-through pattern;Decreased arm swing - right;Decreased arm swing - left;Decreased step length - right;Decreased step length - left;Decreased dorsiflexion - right;Decreased dorsiflexion - left;Lateral hip instability    Ambulation Surface Level;Indoor    Ramp 5: Supervision;Other (comment)    Ramp Details (indicate cue type and reason) 2 trips each directon fwd/bwd/sideways      Knee/Hip Exercises: Aerobic   Stepper Scifit UE/LE level 4 x 8 minutes with goal >/= 60 steps per minute for strengthening and activity tolerance. seat 20 arms 7      Knee/Hip Exercises: Seated   Long Arc Quad Strengthening;Both;1 set;15  reps;Limitations    Long Arc Quad Limitations with lat press    Marching Strengthening;Both;1 set;15 reps;Limitations    Marching Limitations with lat press                 Balance Exercises - 05/18/21 0001       Balance Exercises: Standing   Gait with Head Turns Forward;Limitations    Gait with Head Turns Limitations random turns and nods over 240f    Tandem Gait Forward;Retro;3 reps;Limitations    Tandem Gait Limitations across blue mat w/o UE support    Sidestepping Foam/compliant support;3 reps;Limitations    Sidestepping Limitations Across blue mat w/o UE assist    Other Standing Exercises Standing with single UE support, performing contralateral shoulder/hip abduction 15x per side                  PT Short Term Goals - 04/29/21 1104       PT SHORT TERM GOAL #1   Title Patient to become Ind in initial HEP    Baseline [place] 04/29/21 Compliant with HEP    Time 3    Period Weeks    Status Achieved    Target Date 05/11/21      PT SHORT TERM GOAL #2   Title Patient to negotiate 16 steps with most appropriate pattern and support    Baseline 8 steps with alternating pattern and B rails; 04/25/21 16 steps with single rail and step through pattern    Time 3    Period Weeks    Status Achieved    Target Date 05/11/21      PT SHORT TERM GOAL #3   Title Patient to transfer STS 1x w/o UE support    Baseline Requires BUE support to stand; 04/25/21 Able to stand once arms crossed followed by 4 more with CGA    Time 3    Period Weeks    Status Achieved    Target Date 05/11/21      PT SHORT TERM GOAL #4   Title Patient to ambulate 5043facross outdoor surfaces w/o need of AD    Baseline 20080fndoors w/o need of AD; 04/29/21 Ambulation of 1000f97fross paved and grassy surfaces w/o need of AD    Time 3    Period Weeks    Status Achieved    Target Date 06/01/21               PT Long Term Goals - 05/10/21 1126       PT LONG TERM GOAL #1   Title  Patient to decreased 5x STS time to <12s w/o UE assist  Baseline 15.4s with BUE assist; 05/10/21 13.0s    Time 6    Period Weeks    Status Partially Met    Target Date 06/01/21      PT LONG TERM GOAL #2   Title Patient to increase score of FGA to 26/30    Baseline 20/30; 05/10/21 FGA score 25/30    Time 6    Period Weeks    Status On-going    Target Date 06/01/21      PT LONG TERM GOAL #3   Title Patient to negotiate 16 steps with alternting pattern w/o UE support    Baseline 8 steps with alternating pattern and B UE support; 05/10/21 Able to negotiate 16 steps with single UE support    Time 6    Period Weeks    Status Partially Met    Target Date 06/01/21      PT LONG TERM GOAL #4   Title Ambulate 1052f across outdoor surfaces w/o need of AD    Baseline 2042fin clinic w/o AD; 05/10/21 120064fcross sloped and grass surfaces w/o AD under S    Time 6    Period Weeks    Status New    Target Date 06/01/21      PT LONG TERM GOAL #5   Title Increase FOTO score to 71    Baseline Initial score 55    Time 6    Period Weeks    Status New                   Plan - 05/18/21 1137     Clinical Impression Statement Todays session continued with dynamic balance challenges adding head turns and nods to gait, tandem walking over compliant surfaces, ramp negotiatins in all directions.  BP taken at start of session WNL.  No LOB or c/o dizziness during session.    Personal Factors and Comorbidities Comorbidity 1    Comorbidities CVA    Examination-Activity Limitations Locomotion Level;Transfers;Stairs    Stability/Clinical Decision Making Stable/Uncomplicated    Rehab Potential Good    PT Frequency 2x / week    PT Duration 6 weeks    PT Treatment/Interventions ADLs/Self Care Home Management;Aquatic Therapy;DME Instruction;Gait training;Stair training;Functional mobility training;Therapeutic activities;Balance training;Neuromuscular re-education;Therapeutic  exercise;Patient/family education    PT Next Visit Plan stepping tasks from compliant surfaces, gait with head turn/nods    PT Home Exercise Plan EV8[facility]          Patient will benefit from skilled therapeutic intervention in order to improve the following deficits and impairments:  Abnormal gait, Difficulty walking, Decreased endurance, Decreased activity tolerance, Decreased balance, Decreased strength  Visit Diagnosis: Unsteadiness on feet  Muscle weakness (generalized)  Balance disorder     Problem List Patient Active Problem List   Diagnosis Date Noted   Dysphagia    Leucocytosis    Benign essential HTN    Acute gout of right knee    Acute ischemic right MCA stroke (HCCUnion Springs9/09/2020   Alcohol abuse    Seizure (HCC[place]  Essential hypertension 12/26/2019   Longstanding persistent atrial fibrillation (HCCE[place]  Atrial fibrillation with RVR (HCCEll[place]7/27/2020   Hyperlipidemia 02/03/2019   Anxiety 02/03/2019    JefLanice ShirtsT 05/18/2021, 11:43 AM  [address]Santa [place]Wood[address] HillseFort IrwinC,[place]32one: [phone]:  [phone]: JoeZenon LeafN: [id number] of Birth: 7/110-23-1957

## 2021-05-18 NOTE — Patient Instructions (Addendum)
Continue to use the alarms on your phone to make sure you are taking morning/evening medications  Write daily to-do lists (1-3 things per day) to help completion of necessary goals. Think achievable goals for day to day, not lofty long term goals    Thursday: 1-2 loads of laundry (wash, dry, fold)  Friday:   Saturday: remount dishwasher? Make sure you have all the tools you need and explain the process before you begin   Sunday:

## 2021-05-18 NOTE — Therapy (Signed)
Patrick Padilla [address] [place], [place], [address] Phone: [phone]   Fax:  [phone]  Speech Language Pathology Treatment  Patient Details  Name: Patrick Padilla MRN: [id number] Date of Birth: January 18, 1956 Referring Provider (SLP): Patrick Parsons, PA-C   Encounter Date: 05/18/2021   End of Session - 05/18/21 1004     Visit Number 4    Number of Visits 17    Date for SLP Re-Evaluation 06/10/21    SLP Start Time 9826    SLP Stop Time  1100    SLP Time Calculation (min) 45 min    Activity Tolerance Patient tolerated treatment well             Past Medical History:  Diagnosis Date   Anxiety    Elevated LFTs    History of colon polyps    HLD (hyperlipidemia)    HTN (hypertension)    Localized superficial swelling, mass, or lump     Past Surgical History:  Procedure Laterality Date   CARDIOVERSION N/A 02/05/2019   Procedure: CARDIOVERSION;  Surgeon: Patrick Hector, Padilla;  Location: [facility];  Service: Cardiovascular;  Laterality: N/A;   CERVICAL DISC SURGERY     IR CT HEAD LTD  03/12/2021   IR PERCUTANEOUS ART THROMBECTOMY/INFUSION INTRACRANIAL INC DIAG ANGIO  03/12/2021   IR US GUIDE VASC ACCESS RIGHT  03/15/2021   LEG SURGERY     right leg and ankle   NECK SURGERY     cyst removed   RADIOLOGY WITH ANESTHESIA N/A 03/12/2021   Procedure: RADIOLOGY WITH ANESTHESIA;  Surgeon: Patrick Padilla;  Location: [facility];  Service: Radiology;  Laterality: N/A;   TEE WITHOUT CARDIOVERSION N/A 02/05/2019   Procedure: TRANSESOPHAGEAL ECHOCARDIOGRAM (TEE);  Surgeon: Patrick Hector, Padilla;  Location: [facility] ENDOSCOPY;  Service: Cardiovascular;  Laterality: N/A;   TOTAL HIP ARTHROPLASTY     left    There were no vitals filed for this visit.   Subjective Assessment - 05/18/21 1014     Subjective "I can't remember the channels on the tv"    Currently in Pain? No/denies                   ADULT SLP  TREATMENT - 05/18/21 1004       General Information   Behavior/Cognition Alert;Cooperative;Pleasant mood;Lethargic;Requires cueing      Treatment Provided   Treatment provided Cognitive-Linquistic      Cognitive-Linquistic Treatment   Treatment focused on Cognition;Patient/family/caregiver education    Skilled Treatment CLQT completed this session as it was not completed at eval due to time constraints. Conservator, museum/gallery, mazes, and design generation completed today. Overall CLQT score indicated WNL; however,mild memory (nearly mod) and low normal language exhibited. Pt reported use of alarms to recall when to take medications, which has been effective with no reported missed doses (no family present to confirm or deny). Again, limited motivation reported for completion of usual tasks. Pt has not been writing down information or tracking hours slept as last SLP recommended. SLP sugggested use of to-do lists (1-3 items) with achievable/tangible targets as pt reported initial goals to "fix the roof and get a divorce." Handout provided with HEP and recommendations. Pt reported he attempted to buy a house "over the phone" but did not complete buying process due to construction costs (pt wanted to DIY but no formal construction background reported). With further mod questioning cues, pt attemped to buy current rental house of 15 years.  SLP questions patient awareness and safety related to these tasks, especially at this time.      Assessment / Recommendations / Plan   Plan Continue with current plan of care      Progression Toward Goals   Progression toward goals Progressing toward goals              SLP Education - 05/18/21 1056     Education Details to-do lists, focus on necessary tasks    Person(s) Educated Patient    Methods Explanation;Demonstration;Handout    Comprehension Verbalized understanding;Returned demonstration;Need further instruction              SLP Short Term Goals -  05/18/21 1005       SLP SHORT TERM GOAL #1   Title Pt will use calendar to identify and manage appointments given occasional mod A over 3 sessions    Status Not Met   ongoing for all STGs as ST tx started 1 month after eval   Target Date 05/13/21      SLP SHORT TERM GOAL #2   Title Pt will recall to take medications with use of external aids given occasional min A over 3 sessions    Baseline 05-18-21    Status Not Met    Target Date 05/13/21      SLP SHORT TERM GOAL #3   Title Pt will problem solve possible or encountered household scenarios to optimize completion of daily tasks given occasional min A over 2 sessions    Status Not Met    Target Date 05/13/21      SLP SHORT TERM GOAL #4   Title Pt will initiate completion of 1-2 household tasks per day given occasional mod A from family over 2 sessions    Status Not Met    Target Date 05/13/21      SLP SHORT TERM GOAL #5   Title Pt/caregiver will complete cognitive PROM in first few ST sessions    Time 4    Period Weeks    Status On-going    Target Date 05/13/21              SLP Long Term Goals - 05/18/21 1006       SLP LONG TERM GOAL #1   Title Pt will use memory compensations for managment of appointments, medications, and daily tasks given occasional min A over 2 sessions    Time 8    Period Weeks    Status On-going    Target Date 06/10/21      SLP LONG TERM GOAL #2   Title Pt will verbalize and demonstrate appropriate problem solving to improve completion of household tasks given rare min A over 2 sessions    Time 8    Period Weeks    Status On-going    Target Date 06/10/21      SLP LONG TERM GOAL #3   Title Pt/caregiver will report less reliance on family to complete previously independent household tasks given occasional min A over 2 sessions    Time 8    Period Weeks    Status On-going    Target Date 06/10/21              Plan - 05/18/21 1005     Clinical Impression Statement [name] continues to  present with 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 Pt is reportedly using alarms to cue self to take medications, which has been effective for taking medications per patient. Initiated strategies  to improve his independence with household tasks. [name] reports he is inconsisently doing HEP for PT/OT and low motivation. Continue skilled ST to maximize cognition for independence, safety, and reduce caregiver burden from daughter in law who has an infant at home as well as caring for [place].    Speech Therapy Frequency 2x / week    Duration 8 weeks    Treatment/Interventions Compensatory strategies;Functional tasks;Patient/family education;Language facilitation;Compensatory techniques;Internal/external aids;SLP instruction and feedback;Cognitive reorganization;Environmental controls    Potential to Center    Potential Considerations Cooperation/participation level;Previous level of function    Consulted and Agree with Plan of Care Patient             Patient will benefit from skilled therapeutic intervention in order to improve the following deficits and impairments:   Cognitive communication deficit    Problem List Patient Active Problem List   Diagnosis Date Noted   Dysphagia    Leucocytosis    Benign essential HTN    Acute gout of right knee    Acute ischemic right MCA stroke ([place]) 03/12/2021   Alcohol abuse    Seizure ([employer])    Essential hypertension 12/26/2019   Longstanding persistent atrial fibrillation ([place])    Atrial fibrillation with RVR ([place] [facility]) 02/03/2019   Hyperlipidemia 02/03/2019   Anxiety 02/03/2019    Alinda Deem, MA CCC-SLP 05/18/2021, 11:38 AM  [facility] [address] [place], [place], [address] Phone: [phone]   Fax:  [phone]   Name: Balthazar Dooly MRN: [id number] Date of Birth: 1955/07/15

## 2021-05-18 NOTE — Therapy (Signed)
[facility] [address] [place], [place], [address] Phone: [phone]   Fax:  [phone]  Occupational Therapy Treatment  Patient Details  Name: Patrick Padilla MRN: [id number] Date of Birth: [birth year] Referring Provider (OT): [place]   Encounter Date: 05/18/2021   OT End of Session - 05/18/21 1426     Visit Number 7    Number of Visits 17    Date for OT Re-Evaluation 06/27/21    Authorization Type Medicare A&B, 20% coinsurance    Authorization - Visit Number 6    Progress Note Due on Visit 10    OT Start Time 1145    OT Stop Time 1230    OT Time Calculation (min) 45 min    Activity Tolerance Patient tolerated treatment well    Behavior During Therapy WFL for tasks assessed/performed             Past Medical History:  Diagnosis Date   Anxiety    Elevated LFTs    History of colon polyps    HLD (hyperlipidemia)    HTN (hypertension)    Localized superficial swelling, mass, or lump     Past Surgical History:  Procedure Laterality Date   CARDIOVERSION N/A 02/05/2019   Procedure: CARDIOVERSION;  Surgeon: Josue Hector, MD;  Location: [facility];  Service: Cardiovascular;  Laterality: N/A;   CERVICAL DISC SURGERY     IR CT HEAD LTD  03/12/2021   IR PERCUTANEOUS ART THROMBECTOMY/INFUSION INTRACRANIAL INC DIAG ANGIO  03/12/2021   IR US GUIDE VASC ACCESS RIGHT  03/15/2021   LEG SURGERY     right leg and ankle   NECK SURGERY     cyst removed   RADIOLOGY WITH ANESTHESIA N/A 03/12/2021   Procedure: RADIOLOGY WITH ANESTHESIA;  Surgeon: Radiologist, Medication, MD;  Location: [facility];  Service: Radiology;  Laterality: N/A;   TEE WITHOUT CARDIOVERSION N/A 02/05/2019   Procedure: TRANSESOPHAGEAL ECHOCARDIOGRAM (TEE);  Surgeon: Josue Hector, MD;  Location: [facility] ENDOSCOPY;  Service: Cardiovascular;  Laterality: N/A;   TOTAL HIP ARTHROPLASTY     left    There were no vitals filed for this visit.   Subjective  Assessment - 05/18/21 1149     Subjective  It is a lot better - shoulder pain    Pertinent History HTN, Afib, Anxiety, Multiple ortho injuries    Currently in Pain? No/denies    Pain Score 0-No pain                          OT Treatments/Exercises (OP) - 05/18/21 1150       ADLs   Bathing Patient reports improved ability to get in and out of shower.    Cooking Patient reports that he made hot beef sandwhiches for the family last night.  Able to explain his process, and indicated that they were a big hit with his grandchild!    Leisure Patient reports no motivation to do anything at home.  Patient given instruction to get out into workshop and clean and organize supplies.  Patient instructed to not use power tools at this point,      Neurological Re-education Exercises   Other Exercises 1 Closed chain exercise to address shoulder range of motion, combination motion of shoulder flexion with elbow extension, and building proximal strength.  Patient able to tolerate short bursts of wrist extension with digits extended, and took breaks in fisted position. Followed with active movement in  md to high reach.    Other Exercises 2 Worked with UE ranger to address mid to high supported reach with cueing and facilitation to increase hip extension LLE.  This helps reduce pain with high reach.                      OT Short Term Goals - 05/18/21 1152       OT SHORT TERM GOAL #1   Title Patient will complete an HEP designed to address left shoulder pain and range of motion    Time 4    Period Weeks    Status Achieved      OT SHORT TERM GOAL #2   Title Patient will demonstrate improved safety and confidence (patient report) with transferring into and out of shower.    Time 4    Period Weeks    Status Achieved      OT SHORT TERM GOAL #3   Title Patient will demonstrate improved performance - decreased time, decreased pain in donning/doffing socks    Time 4    Period  Weeks    Status Achieved   pt reports this is much better 05/13/21     OT SHORT TERM GOAL #4   Title Patient will prepare simple warm meal for himself with no more than supervision    Time 4    Period Weeks    Status Achieved               OT Long Term Goals - 05/10/21 1241       OT LONG TERM GOAL #1   Title Patient will complete updated HEP designed to increase range of motion, strength and functional use of non-dominant LUE    Time 8    Period Weeks    Status On-going      OT LONG TERM GOAL #2   Title Patient will return to mowing 50% of his lawn with minimal assistance    Time 8    Period Weeks    Status On-going      OT LONG TERM GOAL #3   Title Patient will return to working with hand (versus power)  tools for household maintenance or work in Patent attorney.    Time 8    Period Weeks    Status On-going      OT LONG TERM GOAL #4   Title Patient will report decrease in pain with reaching patterns with LUE through mid to high reach with pain no more than 1-2/10.    Time 8    Period Weeks    Status On-going      OT LONG TERM GOAL #5   Title Patient will demonstrate 5 lb increase in left grip strength    Time 8    Period Weeks    Status On-going      OT LONG TERM GOAL #6   Title Patient will complete FOTO survey on discharge with at least 10 point improvement (55 UE on eval)    Time 8    Period Weeks    Status On-going                   Plan - 05/18/21 1427     Clinical Impression Statement Pt continues to report less (to no) shoulder pain.  Patient improving activity level at home, and has met his short term goals.    OT Occupational Profile and History Detailed Assessment- Review of Records and additional review  of physical, cognitive, psychosocial history related to current functional performance    Occupational performance deficits (Please refer to evaluation for details): ADL's;IADL's;Leisure    Body Structure / Function / Physical Skills  ADL;Decreased knowledge of use of DME;Strength;Balance;Dexterity;GMC;Pain;Tone;Body mechanics;Hearing;UE functional use;Cardiopulmonary status limiting activity;Endurance;IADL;ROM;Vestibular;Coordination;Flexibility;Mobility;Sensation;FMC;Decreased knowledge of precautions    Rehab Potential Good    Clinical Decision Making Several treatment options, min-mod task modification necessary    Comorbidities Affecting Occupational Performance: May have comorbidities impacting occupational performance    Modification or Assistance to Complete Evaluation  Min-Moderate modification of tasks or assist with assess necessary to complete eval    OT Frequency 2x / week    OT Duration 8 weeks    OT Treatment/Interventions Self-care/ADL training;Moist Heat;Fluidtherapy;DME and/or AE instruction;Balance training;Aquatic Therapy;Therapeutic activities;Ultrasound;Therapeutic exercise;Cognitive remediation/compensation;Neuromuscular education;Functional Mobility Training;Passive range of motion;Visual/perceptual remediation/compensation;Electrical Stimulation;Manual Therapy;Patient/family education    Plan NMR LUE - closed to open chain, mid to high reach active assisted - in pain free ranges    Consulted and Agree with Plan of Care Patient             Patient will benefit from skilled therapeutic intervention in order to improve the following deficits and impairments:   Body Structure / Function / Physical Skills: ADL, Decreased knowledge of use of DME, Strength, Balance, Dexterity, GMC, Pain, Tone, Body mechanics, Hearing, UE functional use, Cardiopulmonary status limiting activity, Endurance, IADL, ROM, Vestibular, Coordination, Flexibility, Mobility, Sensation, FMC, Decreased knowledge of precautions       Visit Diagnosis: Muscle weakness (generalized)  Unsteadiness on feet  Acute pain of left shoulder  Attention and concentration deficit  Stiffness of left shoulder, not elsewhere  classified    Problem List Patient Active Problem List   Diagnosis Date Noted   Dysphagia    Leucocytosis    Benign essential HTN    Acute gout of right knee    Acute ischemic right MCA stroke ([facility]) 03/12/2021   Alcohol abuse    Seizure ([place])    Essential hypertension 12/26/2019   Longstanding persistent atrial fibrillation ([facility])    Atrial fibrillation with RVR ([facility]) 02/03/2019   Hyperlipidemia 02/03/2019   Anxiety 02/03/2019    Mariah Milling, OT/L 05/18/2021, 2:29 PM  [facility] [address] [place], [place], [address] Phone: [phone]   Fax:  [phone]  Name: Kurtis Anastasia MRN: [id number] Date of Birth: 1955/11/30

## 2021-05-20 ENCOUNTER — Encounter: Payer: Self-pay | Admitting: Occupational Therapy

## 2021-05-20 ENCOUNTER — Ambulatory Visit: Payer: Medicare Other

## 2021-05-20 ENCOUNTER — Other Ambulatory Visit: Payer: Self-pay

## 2021-05-20 ENCOUNTER — Ambulatory Visit: Payer: Medicare Other | Admitting: Occupational Therapy

## 2021-05-20 DIAGNOSIS — R2689 Other abnormalities of gait and mobility: Secondary | ICD-10-CM | POA: Diagnosis not present

## 2021-05-20 DIAGNOSIS — M6281 Muscle weakness (generalized): Secondary | ICD-10-CM

## 2021-05-20 DIAGNOSIS — R2681 Unsteadiness on feet: Secondary | ICD-10-CM

## 2021-05-20 DIAGNOSIS — M25512 Pain in left shoulder: Secondary | ICD-10-CM | POA: Diagnosis not present

## 2021-05-20 DIAGNOSIS — R41840 Attention and concentration deficit: Secondary | ICD-10-CM

## 2021-05-20 DIAGNOSIS — M25612 Stiffness of left shoulder, not elsewhere classified: Secondary | ICD-10-CM | POA: Diagnosis not present

## 2021-05-20 NOTE — Therapy (Signed)
[facility] [address] [place], [place], [address] Phone: [phone]   Fax:  [phone]  Physical Therapy Treatment  Patient Details  Name: Patrick Padilla MRN: [id number] Date of Birth: 1955-12-29 No data recorded  Encounter Date: 05/20/2021   PT End of Session - 05/20/21 1025     Visit Number 8    Number of Visits 13    Date for PT Re-Evaluation 06/01/21    Authorization Type MCR/MCD    Progress Note Due on Visit 10    PT Start Time 1020    PT Stop Time 1100    PT Time Calculation (min) 40 min    Equipment Utilized During Treatment Gait belt    Activity Tolerance Patient tolerated treatment well    Behavior During Therapy WFL for tasks assessed/performed             Past Medical History:  Diagnosis Date   Anxiety    Elevated LFTs    History of colon polyps    HLD (hyperlipidemia)    HTN (hypertension)    Localized superficial swelling, mass, or lump     Past Surgical History:  Procedure Laterality Date   CARDIOVERSION N/A 02/05/2019   Procedure: CARDIOVERSION;  Surgeon: Josue Hector, MD;  Location: [facility];  Service: Cardiovascular;  Laterality: N/A;   CERVICAL DISC SURGERY     IR CT HEAD LTD  03/12/2021   IR PERCUTANEOUS ART THROMBECTOMY/INFUSION INTRACRANIAL INC DIAG ANGIO  03/12/2021   IR US GUIDE VASC ACCESS RIGHT  03/15/2021   LEG SURGERY     right leg and ankle   NECK SURGERY     cyst removed   RADIOLOGY WITH ANESTHESIA N/A 03/12/2021   Procedure: RADIOLOGY WITH ANESTHESIA;  Surgeon: Radiologist, Medication, MD;  Location: [facility];  Service: Radiology;  Laterality: N/A;   TEE WITHOUT CARDIOVERSION N/A 02/05/2019   Procedure: TRANSESOPHAGEAL ECHOCARDIOGRAM (TEE);  Surgeon: Josue Hector, MD;  Location: [facility] ENDOSCOPY;  Service: Cardiovascular;  Laterality: N/A;   TOTAL HIP ARTHROPLASTY     left    There were no vitals filed for this visit.   Subjective Assessment - 05/20/21 1024      Subjective Some increased soreness today as he has been more active physically.    Patient is accompained by: Family member    Pertinent History MRI+acute infarct at the right basal ganglia. Few and  punctate bifrontal peripheral infarcts. Recent medial left thalamic infarct which may be subacute. 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    Limitations Standing;Walking    How long can you sit comfortably? >15 min    How long can you stand comfortably? <15 min    How long can you walk comfortably? <15 min    Patient Stated Goals To become more steady on his feet and return to outside ambulation    Pain Onset More than a month ago                               [facility] Adult PT Treatment/Exercise - 05/20/21 0001       Transfers   Transfers Sit to Stand;Stand to Sit    Sit to Stand 6: Modified independent (Device/Increase time)    Stand to Sit 6: Modified independent (Device/Increase time)      Ambulation/Gait   Ambulation/Gait Yes    Ambulation/Gait Assistance 5: Supervision    Ambulation/Gait Assistance  Details toss/catch to self with ball    Ambulation Distance (Feet) 400 Feet    Assistive device None    Gait Pattern Step-through pattern;Decreased arm swing - right;Decreased arm swing - left;Decreased step length - right;Decreased step length - left;Decreased dorsiflexion - right;Decreased dorsiflexion - left;Lateral hip instability    Ambulation Surface Level;Indoor      Lumbar Exercises: Seated   Other Seated Lumbar Exercises core exercises of hip/shoulder tosses, chops and Vs using 3.3 ball for 10 reps each.      Knee/Hip Exercises: Aerobic   Stepper Scifit UE/LE level 4 x 8 minutes with goal >/= 60 steps per minute for strengthening and activity tolerance. seat 20 arms 7      Knee/Hip Exercises: Seated   Long Arc Quad Strengthening;Both;15 reps;Limitations;2 sets    Long Arc Quad Weight 3 lbs.    Long  Arc [employer] Limitations with Insurance claims handler Limitations with lat press    Marching Weights 3 lbs.                 Balance Exercises - 05/20/21 0001       Balance Exercises: Standing   Stepping Strategy Anterior;Lateral;Foam/compliant surface;UE support;10 reps;Limitations    Stepping Strategy Limitations intermittent UE support, from airex onto 6 " block, full step ups 10x each LE each direction.    Other Standing Exercises --                  PT Short Term Goals - 04/29/21 1104       PT SHORT TERM GOAL #1   Title Patient to become Ind in initial [place]; 04/29/21 Compliant with HEP    Time 3    Period Weeks    Status Achieved    Target Date 05/11/21      PT SHORT TERM GOAL #2   Title Patient to negotiate 16 steps with most appropriate pattern and support    Baseline 8 steps with alternating pattern and B rails; 04/25/21 16 steps with single rail and step through pattern    Time 3    Period Weeks    Status Achieved    Target Date 05/11/21      PT SHORT TERM GOAL #3   Title Patient to transfer STS 1x w/o UE support    Baseline Requires BUE support to stand; 04/25/21 Able to stand once arms crossed followed by 4 more with CGA    Time 3    Period Weeks    Status Achieved    Target Date 05/11/21      PT SHORT TERM GOAL #4   Title Patient to ambulate 550f across outdoor surfaces w/o need of AD    Baseline 202findoors w/o need of AD; 04/29/21 Ambulation of 100074fcross paved and grassy surfaces w/o need of AD    Time 3    Period Weeks    Status Achieved    Target Date 06/01/21               PT Long Term Goals - 05/20/21 1026       PT LONG TERM GOAL #1   Title Patient to decreased 5x STS time to <12s w/o UE assist    Baseline 15.4s with BUE assist; 05/10/21 13.0s    Time 6    Period Weeks    Status Partially Met      PT LONG TERM  GOAL #2   Title Patient to increase score  of FGA to 26/30    Baseline 20/30; 05/10/21 FGA score 25/30    Time 6    Period Weeks    Status On-going      PT LONG TERM GOAL #3   Title Patient to negotiate 16 steps with alternting pattern w/o UE support    Baseline 8 steps with alternating pattern and B UE support; 05/10/21 Able to negotiate 16 steps with single UE support    Time 6    Period Weeks    Status Partially Met      PT LONG TERM GOAL #4   Title Ambulate 108f across outdoor surfaces w/o need of AD    Baseline 2046fin clinic w/o AD; 05/10/21 120025fcross sloped and grass surfaces w/o AD under S    Time 6    Period Weeks    Status On-going      PT LONG TERM GOAL #5   Title Increase FOTO score to 71    Baseline Initial score 55    Time 6    Period Weeks    Status On-going                   Plan - 05/20/21 1052     Clinical Impression Statement Continued core strengthening increasing weight.  Stepping up onto 6" block from airex requiring intermittent BUE support, fingertip, with some diificiulty with accurate foot placement.  Some loss of focus when asked to dual tasking activities and following complex directions, possibly affected by hearing loss.    Personal Factors and Comorbidities Comorbidity 1    Comorbidities CVA    Examination-Activity Limitations Locomotion Level;Transfers    Stability/Clinical Decision Making Stable/Uncomplicated    Rehab Potential Good    PT Frequency 2x / week    PT Duration 6 weeks    PT Treatment/Interventions ADLs/Self Care Home Management;Aquatic Therapy;DME Instruction;Gait training;Stair training;Functional mobility training;Therapeutic activities;Balance training;Neuromuscular re-education;Therapeutic exercise;Patient/family education    PT Next Visit Plan stepping tasks from compliant surfaces, gait with head turn/nods, dual tasking    PT Home Exercise Plan [facility]          Patient will benefit from skilled therapeutic intervention in order to improve the  following deficits and impairments:  Abnormal gait, Difficulty walking, Decreased endurance, Decreased activity tolerance, Decreased balance, Decreased strength  Visit Diagnosis: Unsteadiness on feet  Muscle weakness (generalized)  Balance disorder     Problem List Patient Active Problem List   Diagnosis Date Noted   Dysphagia    Leucocytosis    Benign essential HTN    Acute gout of right knee    Acute ischemic right MCA stroke ([facility]9/09/2020   Alcohol abuse    Seizure ([facility] [place]  Essential hypertension 12/26/2019   Longstanding persistent atrial fibrillation ([facility]  Atrial fibrillation with RVR ([facility]CC[place]7/27/2020   Hyperlipidemia 02/03/2019   Anxiety 02/03/2019    JefLanice ShirtsT 05/20/2021, 11:02 AM  [place]one: [phone]:  [phone]: JoeKingsley FaraceN: [id number] of Birth: [birth year]

## 2021-05-20 NOTE — Therapy (Signed)
[facility] [address] [place], [place], [address] Phone: [phone]   Fax:  [phone]  Occupational Therapy Treatment  Patient Details  Name: Patrick Padilla MRN: [id number] Date of Birth: Aug 18, 1955 Referring Provider (OT): [place]   Encounter Date: 05/20/2021   OT End of Session - 05/20/21 1102     Visit Number 8    Number of Visits 17    Date for OT Re-Evaluation 06/27/21    Authorization Type Medicare A&B, 20% coinsurance    Authorization - Visit Number 7    Progress Note Due on Visit 10    OT Start Time 1101    OT Stop Time 1140    OT Time Calculation (min) 39 min    Activity Tolerance Patient tolerated treatment well    Behavior During Therapy WFL for tasks assessed/performed             Past Medical History:  Diagnosis Date   Anxiety    Elevated LFTs    History of colon polyps    HLD (hyperlipidemia)    HTN (hypertension)    Localized superficial swelling, mass, or lump     Past Surgical History:  Procedure Laterality Date   CARDIOVERSION N/A 02/05/2019   Procedure: CARDIOVERSION;  Surgeon: Josue Hector, MD;  Location: [facility];  Service: Cardiovascular;  Laterality: N/A;   CERVICAL DISC SURGERY     IR CT HEAD LTD  03/12/2021   IR PERCUTANEOUS ART THROMBECTOMY/INFUSION INTRACRANIAL INC DIAG ANGIO  03/12/2021   IR US GUIDE VASC ACCESS RIGHT  03/15/2021   LEG SURGERY     right leg and ankle   NECK SURGERY     cyst removed   RADIOLOGY WITH ANESTHESIA N/A 03/12/2021   Procedure: RADIOLOGY WITH ANESTHESIA;  Surgeon: Radiologist, Medication, MD;  Location: [facility];  Service: Radiology;  Laterality: N/A;   TEE WITHOUT CARDIOVERSION N/A 02/05/2019   Procedure: TRANSESOPHAGEAL ECHOCARDIOGRAM (TEE);  Surgeon: Josue Hector, MD;  Location: [facility] ENDOSCOPY;  Service: Cardiovascular;  Laterality: N/A;   TOTAL HIP ARTHROPLASTY     left    There were no vitals filed for this visit.   Subjective  Assessment - 05/20/21 1102     Subjective  "Little sore earlier but stretched it out. My left shoulder is much better. My hand strength is really bad - I couldn't open the jelly jar."    Pertinent History HTN, Afib, Anxiety, Multiple ortho injuries    Currently in Pain? No/denies    Pain Score 0-No pain                          OT Treatments/Exercises (OP) - 05/20/21 1107       Exercises   Exercises Hand;Work Hardening      Shoulder Exercises: Standing   Other Standing Exercises with ball on wall x 10 and trunk rotated towards LUE abduction x 10      Hand Exercises   Other Hand Exercises hand gripper with picking up 1 inch blocks with LUE - level 3 with black spring.      Work Leisure centre manager   UBE (Upper Arm Bike) x 8 minutes (forward and backward) for normal movement pattern of LUE shoulder, conditioning      Functional Reaching Activities   Mid Level rubber washers with LUE                      OT  Short Term Goals - 05/18/21 1152       OT SHORT TERM GOAL #1   Title Patient will complete an HEP designed to address left shoulder pain and range of motion    Time 4    Period Weeks    Status Achieved      OT SHORT TERM GOAL #2   Title Patient will demonstrate improved safety and confidence (patient report) with transferring into and out of shower.    Time 4    Period Weeks    Status Achieved      OT SHORT TERM GOAL #3   Title Patient will demonstrate improved performance - decreased time, decreased pain in donning/doffing socks    Time 4    Period Weeks    Status Achieved   pt reports this is much better 05/13/21     OT SHORT TERM GOAL #4   Title Patient will prepare simple warm meal for himself with no more than supervision    Time 4    Period Weeks    Status Achieved               OT Long Term Goals - 05/20/21 1112       OT LONG TERM GOAL #1   Title Patient will complete updated HEP designed to increase range of motion,  strength and functional use of non-dominant LUE    Time 8    Period Weeks    Status On-going      OT LONG TERM GOAL #2   Title Patient will return to mowing 50% of his lawn with minimal assistance    Time 8    Period Weeks    Status On-going      OT LONG TERM GOAL #3   Title Patient will return to working with hand (versus power)  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    Time 8    Period Weeks    Status On-going   pt reports he could but has not done any work in his shop     OT LONG TERM GOAL #4   Title Patient will report decrease in pain with reaching patterns with LUE through mid to high reach with pain no more than 1-2/10.    Time 8    Period Weeks    Status On-going      OT LONG TERM GOAL #5   Title Patient will demonstrate 5 lb increase in left grip strength    Time 8    Period Weeks    Status On-going      OT LONG TERM GOAL #6   Title Patient will complete FOTO survey on discharge with at least 10 point improvement (55 UE on eval)    Time 8    Period Weeks    Status On-going                   Plan - 05/20/21 1136     Clinical Impression Statement Pt continuing to make progress. Reports no pain today in shoulder. Pt reported difficulty with LUE grip strength today and worked on grip strength in OT session.    OT Occupational Profile and History Detailed Assessment- Review of Records and additional review of physical, cognitive, psychosocial history related to current functional performance    Occupational performance deficits (Please refer to evaluation for details): ADL's;IADL's;Leisure    Body Structure / Function / Physical Skills ADL;Decreased knowledge of use of DME;Strength;Balance;Dexterity;GMC;Pain;Tone;Body mechanics;Hearing;UE functional use;Cardiopulmonary  status limiting activity;Endurance;IADL;ROM;Vestibular;Coordination;Flexibility;Mobility;Sensation;FMC;Decreased knowledge of precautions    Rehab Potential Good    Clinical  Decision Making Several treatment options, min-mod task modification necessary    Comorbidities Affecting Occupational Performance: May have comorbidities impacting occupational performance    Modification or Assistance to Complete Evaluation  Min-Moderate modification of tasks or assist with assess necessary to complete eval    OT Frequency 2x / week    OT Duration 8 weeks    OT Treatment/Interventions Self-care/ADL training;Moist Heat;Fluidtherapy;DME and/or AE instruction;Balance training;Aquatic Therapy;Therapeutic activities;Ultrasound;Therapeutic exercise;Cognitive remediation/compensation;Neuromuscular education;Functional Mobility Training;Passive range of motion;Visual/perceptual remediation/compensation;Electrical Stimulation;Manual Therapy;Patient/family education    Plan NMR LUE - closed to open chain, mid to high reach active assisted - in pain free ranges    Consulted and Agree with Plan of Care Patient             Patient will benefit from skilled therapeutic intervention in order to improve the following deficits and impairments:   Body Structure / Function / Physical Skills: ADL, Decreased knowledge of use of DME, Strength, Balance, Dexterity, GMC, Pain, Tone, Body mechanics, Hearing, UE functional use, Cardiopulmonary status limiting activity, Endurance, IADL, ROM, Vestibular, Coordination, Flexibility, Mobility, Sensation, FMC, Decreased knowledge of precautions       Visit Diagnosis: Unsteadiness on feet  Muscle weakness (generalized)  Acute pain of left shoulder  Attention and concentration deficit  Stiffness of left shoulder, not elsewhere classified    Problem List Patient Active Problem List   Diagnosis Date Noted   Dysphagia    Leucocytosis    Benign essential HTN    Acute gout of right knee    Acute ischemic right MCA stroke ([facility]) 03/12/2021   Alcohol abuse    Seizure ([place])    Essential hypertension 12/26/2019   Longstanding persistent atrial  fibrillation ([place])    Atrial fibrillation with RVR ([place] [facility]) 02/03/2019   Hyperlipidemia 02/03/2019   Anxiety 02/03/2019    Zachery Conch, OT/L 05/20/2021, 11:40 AM  [facility] [address] [place], [place], [address] Phone: [phone]   Fax:  [phone]  Name: Patrick Padilla MRN: [id number] Date of Birth: 11-02-1955

## 2021-05-23 ENCOUNTER — Encounter: Payer: Self-pay | Admitting: Adult Health

## 2021-05-23 ENCOUNTER — Ambulatory Visit (INDEPENDENT_AMBULATORY_CARE_PROVIDER_SITE_OTHER): Payer: Medicare Other | Admitting: Adult Health

## 2021-05-23 VITALS — BP 100/61 | HR 84 | Ht 72.0 in | Wt 203.0 lb

## 2021-05-23 DIAGNOSIS — I10 Essential (primary) hypertension: Secondary | ICD-10-CM | POA: Diagnosis not present

## 2021-05-23 DIAGNOSIS — I63411 Cerebral infarction due to embolism of right middle cerebral artery: Secondary | ICD-10-CM | POA: Diagnosis not present

## 2021-05-23 DIAGNOSIS — E785 Hyperlipidemia, unspecified: Secondary | ICD-10-CM

## 2021-05-23 DIAGNOSIS — I480 Paroxysmal atrial fibrillation: Secondary | ICD-10-CM

## 2021-05-23 NOTE — Progress Notes (Signed)
[facility] [address] [place]. [place] [address] [phone]       HOSPITAL FOLLOW UP NOTE  [name] Date of Birth:  [birth year] Medical Record Number:  [id number]   Reason for Referral:  hospital stroke follow up    SUBJECTIVE:   CHIEF COMPLAINT:  Chief Complaint  Patient presents with   Follow-up    Rm 2 alone Pt is well, has some dizziness with standing. PT has helped and he is improving.     HPI:   Patrick Padilla is a 65 y.o. patient with PMHx of A. fib s/p CVA in the past, and EtOH abuse who was admitted on 03/12/2021 with confusion, chest pain and diaphoresis.  He was found to be in A. fib with RVR in route to ED via EMG.  At admission he was hemiplegic, obtunded with hypoxic respiratory failure requiring intubation and right-sided jerking movements.  Personally reviewed hospitalization pertinent progress notes, lab work and imaging.  Evaluated by Dr. Roda Shutters for right MCA stroke due to proximal right M1/MCA occlusion s/p IR with TICI 3 reperfusion, embolic likely due to A. Fib s/p cardioversion. MR brain also showed PRES appearance in bilateral parieto-occipital cortex.  EF 60 to 65%.  EEG no seizure or epileptiform discharges. LTM EEG moderate diffuse encephalopathy.  LDL 97.  A1c 5.2.  Unclear if on Eliquis PTA - recommended starting Eliquis 5 mg twice daily for stroke prevention and atrial fibrillation.  Initiate atorvastatin 40 mg daily.  EtOH and tobacco use with cessation counseling provided.  Residual deficits of left-sided weakness. D/c'd to CIR for ongoing therapy needs .  Today, 05/23/2021, patient being seen for hospital follow-up unaccompanied.  Doing well since discharge currently working with therapies with gradual recovery. Reports continued mild left sided weakness, left shoulder pain, lower back pain - pain gradually improving working with therapies. Occasional dizziness sensation upon standing. Living with son, daughter-in-law and grandbaby -  arrangement PTA. He requests ability to return back to driving.  Denies new stroke/TIA symptoms.  Compliant on Eliquis and atorvastatin -denies side effects.  Blood pressure today 106/61. Recent med adjustments by PCP for hypotension (decreased amlodipine dosage from 10 mg to 5 mg daily).  No further concerns at this time    PERTINENT IMAGING/LABS   CT Possible subtle loss of gray-white differentiation in R lentiform nucleus and bilateral thalami  CTA head & neck Emergent LVO of the right M1 segment with poor collaterals. CT perfusion: R MCA infarct core 53ml with penumbra of . IR 9/3 Proximal right M1/MCA occlusion s/p mechanical thrombectomy with TICI3) MRI Confluent acute infarct at the right basal ganglia. Few and punctate bifrontal peripheral infarcts. Recent medial left thalamic infarct which may be subacute. PRES appearance in the bilateral parietooccipital cortex. Small volume blood products at the right sylvian fissure without evidence of progression from prior CT. 2D Echo EF 60-65%  EEG spot: no seizure or seizure predisposition recorded on this study.  LTM EEG This study is suggestive of moderate diffuse encephalopathy LDL 97 HgbA1c 5.2    ROS:   14 system review of systems performed and negative with exception of those listed in HPI  PMH:  Past Medical History:  Diagnosis Date   Anxiety    Elevated LFTs    History of colon polyps    HLD (hyperlipidemia)    HTN (hypertension)    Localized superficial swelling, mass, or lump     PSH:  Past Surgical History:  Procedure Laterality Date  CARDIOVERSION N/A 02/05/2019   Procedure: CARDIOVERSION;  Surgeon: Josue Hector, MD;  Location: [place];  Service: Cardiovascular;  Laterality: N/A;   CERVICAL DISC SURGERY     IR CT HEAD LTD  03/12/2021   IR PERCUTANEOUS ART THROMBECTOMY/INFUSION INTRACRANIAL INC DIAG ANGIO  03/12/2021   IR US GUIDE VASC ACCESS RIGHT  03/15/2021   LEG SURGERY     right leg and ankle   NECK  SURGERY     cyst removed   RADIOLOGY WITH ANESTHESIA N/A 03/12/2021   Procedure: RADIOLOGY WITH ANESTHESIA;  Surgeon: Radiologist, Medication, MD;  Location: [facility];  Service: Radiology;  Laterality: N/A;   TEE WITHOUT CARDIOVERSION N/A 02/05/2019   Procedure: TRANSESOPHAGEAL ECHOCARDIOGRAM (TEE);  Surgeon: Josue Hector, MD;  Location: [facility] ENDOSCOPY;  Service: Cardiovascular;  Laterality: N/A;   TOTAL HIP ARTHROPLASTY     left    Social History:  Social History   Socioeconomic History   Marital status: Married    Spouse name: Not on file   Number of children: 2   Years of education: Not on file   Highest education level: Not on file  Occupational History   Occupation: disabled    Employer: SELF-EMPLOYED  Tobacco Use   Smoking status: Every Day   Smokeless tobacco: Never  Vaping Use   Vaping Use: Never used  Substance and Sexual Activity   Alcohol use: Yes    Comment: 3-4 a day   Drug use: No   Sexual activity: Not on file  Other Topics Concern   Not on file  Social History Narrative   Not on file   Social Determinants of Health   Financial Resource Strain: Not on file  Food Insecurity: Not on file  Transportation Needs: Not on file  Physical Activity: Not on file  Stress: Not on file  Social Connections: Not on file  Intimate Partner Violence: Not on file    Family History:  Family History  Problem Relation Age of Onset   Hepatitis Mother    Clotting disorder Mother    Stroke Mother    Thyroid disease Mother    Colon cancer Father        ?   Rheumatic fever Brother    Heart disease Brother     Medications:   Current Outpatient Medications on File Prior to Visit  Medication Sig Dispense Refill   amLODipine (NORVASC) 10 MG tablet Take 1 tablet (10 mg total) by mouth daily. (Patient taking differently: Take 5 mg by mouth daily.) 30 tablet 1   apixaban (ELIQUIS) 5 MG TABS tablet Take 1 tablet (5 mg total) by mouth 2 (two) times daily. 60 tablet 3    atorvastatin (LIPITOR) 40 MG tablet Take 1 tablet (40 mg total) by mouth daily. 30 tablet 3   folic acid (FOLVITE) 1 MG tablet Take 1 tablet (1 mg total) by mouth daily. 30 tablet 3   irbesartan (AVAPRO) 75 MG tablet Take 1 tablet (75 mg total) by mouth daily. 30 tablet 3   metoprolol tartrate (LOPRESSOR) 100 MG tablet Take 1 tablet (100 mg total) by mouth 2 (two) times daily. 60 tablet 1   Multiple Vitamin (MULTIVITAMIN WITH MINERALS) TABS tablet Take 1 tablet by mouth daily. 30 tablet 3   PARoxetine (PAXIL) 30 MG tablet Take 1 tablet (30 mg total) by mouth daily. 90 tablet 0   telmisartan (MICARDIS) 40 MG tablet Take 40 mg by mouth daily.     No current facility-administered medications  on file prior to visit.    Allergies:  No Known Allergies    OBJECTIVE:  Physical Exam  Vitals:   05/23/21 1456  BP: 100/61  Pulse: 84  Weight: 203 lb (92.1 kg)  Height: 6' (1.829 m)   Body mass index is 27.53 kg/m. No results found.   General: well developed, well nourished, very pleasant middle-aged Caucasian male, seated, in no evident distress Head: head normocephalic and atraumatic.   Neck: supple with no carotid or supraclavicular bruits Cardiovascular: regular rate and rhythm, no murmurs Musculoskeletal: no deformity Skin:  no rash/petichiae Vascular:  Normal pulses all extremities   Neurologic Exam Mental Status: Awake and fully alert. Fluent speech and language. Oriented to place and time. Recent and remote memory intact. Attention span, concentration and fund of knowledge appropriate. Mood and affect appropriate.  Cranial Nerves: Fundoscopic exam reveals sharp disc margins. Pupils equal, briskly reactive to light. Extraocular movements full without nystagmus. Visual fields full to confrontation. Hearing intact. Facial sensation intact. Face, tongue, palate moves normally and symmetrically.  Motor: Normal bulk and tone. Normal strength in all tested extremity muscles except mild  LUE weakness distally  Sensory.: intact to touch , pinprick , position and vibratory sensation except slight decreased light touch sensation LUE and LLE.  Coordination: Rapid alternating movements normal in all extremities. Finger-to-nose and heel-to-shin performed accurately bilaterally. Gait and Station: Arises from chair without difficulty. Stance is normal. Gait demonstrates normal stride length and mild imbalance without use of AD.  Difficulty performing tandem walk and heel toe.  Reflexes: 1+ and symmetric. Toes downgoing.     NIHSS  1 (sensory) Modified Rankin  2      ASSESSMENT: Patrick Padilla is a 65 y.o. year old male with recent right MCA stroke on 03/12/2021 in setting of proximal right M1/MCA occlusion s/p IR with TICI 3 reperfusion, embolic likely due to A. Fib s/p cardioversion. Vascular risk factors include A. fib with unclear compliance with Eliquis, HTN, HLD, hx of EtOH use, and tobacco use      PLAN:  Right MCA stroke:  Residual deficit: Mild LUE weakness and sensory impairment.  Continue working with PT/OT/SLP for likely ongoing recovery.  Graduated return to driving instructions were provided. It is recommended that the patient first drives with another licensed driver in an empty parking lot. If the patient does well, they can drive on a quiet street with the licensed driver. If the patient does well, they can drive on a busy street with a licensed driver.  If patient continues to do well, patient will be cleared to drive independently.  For the first month after resuming driving, it is recommend no nighttime, busy/heavy traffic roads or Interstate driving.  Continue Eliquis (apixaban) daily  and atorvastatin for secondary stroke prevention.  Discussed secondary stroke prevention measures and importance of close PCP follow up for aggressive stroke risk factor management. I have gone over the pathophysiology of stroke, warning signs and symptoms, risk factors and their  management in some detail with instructions to go to the closest emergency room for symptoms of concern. Atrial fibrillation: On Eliquis 5 mg twice daily for CHA2DS2-VASc score of at least 4. Rx managed by cardiology Dr. Sallyanne Kuster . Has f/u visit 11/22 cardiology HTN: BP goal <130/90.  Stable on lower side on current regimen per PCP - recent amlodipine dosage adjustment - discussed continued monitoring and f/u with PCP HLD: LDL goal <70. Recent LDL 97.  Continue atorvastatin 40 mg daily managed by PCP  Tobacco use: Discussed importance of complete tobacco cessation    Follow up in 4 months or call earlier if needed   CC:  [facility] provider: Dr. Leonie Man PCP: Dorna Mai, MD    I spent 56 minutes of face-to-face and non-face-to-face time with patient.  This included previsit chart review including review of recent hospitalization, lab review, study review, electronic health record documentation, patient education regarding recent stroke including etiology, secondary stroke prevention measures and importance of managing stroke risk factors, residual deficits and typical recovery time and answered all other questions to patient satisfaction  Patrick Padilla, AGNP-BC  [facility] [address] [place], [place] [address]  Phone [phone] Fax [phone] Note: This document was prepared with digital dictation and possible smart phrase technology. Any transcriptional errors that result from this process are unintentional.

## 2021-05-23 NOTE — Patient Instructions (Signed)
Continue Eliquis (apixaban) daily  and atorvastatin  for secondary stroke prevention  Continue to follow up with PCP regarding cholesterol and blood pressure management  Maintain strict control of hypertension with blood pressure goal below 130/90 and cholesterol with LDL cholesterol (bad cholesterol) goal below 70 mg/dL.   Graduated return to driving as recommended.  It is recommended that you first drive with another licensed driver in an empty parking lot. If you do well with this, you can drive on a quiet street with the licensed driver.  If you do well with this, you can drive on a busy street with a licensed driver.  If you continue to do well, you can be cleared to drive independently.  For the first month after resuming driving, it is recommend no nighttime, busy/heavy traffic roads or Interstate driving.   Continue working with therapies for likely ongoing stroke recovery     Followup in the future with me in 4 months or call earlier if needed       Thank you for coming to see [place] at [facility]. I hope we have been able to provide you high quality care today.  You may receive a patient satisfaction survey over the next few weeks. We would appreciate your feedback and comments so that we may continue to improve ourselves and the health of our patients.

## 2021-05-24 ENCOUNTER — Encounter: Payer: Self-pay | Admitting: Adult Health

## 2021-05-24 NOTE — Progress Notes (Signed)
I agree with the above plan 

## 2021-05-25 ENCOUNTER — Ambulatory Visit: Payer: Medicare Other | Admitting: Occupational Therapy

## 2021-05-25 ENCOUNTER — Other Ambulatory Visit: Payer: Self-pay

## 2021-05-25 ENCOUNTER — Ambulatory Visit: Payer: Medicare Other

## 2021-05-25 VITALS — BP 116/76

## 2021-05-25 DIAGNOSIS — R41841 Cognitive communication deficit: Secondary | ICD-10-CM

## 2021-05-25 DIAGNOSIS — R41840 Attention and concentration deficit: Secondary | ICD-10-CM | POA: Diagnosis not present

## 2021-05-25 DIAGNOSIS — M6281 Muscle weakness (generalized): Secondary | ICD-10-CM | POA: Diagnosis not present

## 2021-05-25 DIAGNOSIS — M25612 Stiffness of left shoulder, not elsewhere classified: Secondary | ICD-10-CM | POA: Diagnosis not present

## 2021-05-25 DIAGNOSIS — R2689 Other abnormalities of gait and mobility: Secondary | ICD-10-CM | POA: Diagnosis not present

## 2021-05-25 DIAGNOSIS — R2681 Unsteadiness on feet: Secondary | ICD-10-CM

## 2021-05-25 DIAGNOSIS — M25512 Pain in left shoulder: Secondary | ICD-10-CM | POA: Diagnosis not present

## 2021-05-25 NOTE — Therapy (Signed)
[facility] [address] [place], [place], [address] Phone: [phone]   Fax:  [phone]  Speech Language Pathology Treatment  Patient Details  Name: Patrick Padilla MRN: [id number] Date of Birth: [birth year] Referring Provider (SLP): Charlton Amor, PA-C   Encounter Date: 05/25/2021   End of Session - 05/25/21 1007     Visit Number 5    Number of Visits 17    Date for SLP Re-Evaluation 06/10/21    SLP Start Time 1015    SLP Stop Time  1059    SLP Time Calculation (min) 44 min    Activity Tolerance Patient tolerated treatment well             Past Medical History:  Diagnosis Date   Anxiety    Elevated LFTs    History of colon polyps    HLD (hyperlipidemia)    HTN (hypertension)    Localized superficial swelling, mass, or lump     Past Surgical History:  Procedure Laterality Date   CARDIOVERSION N/A 02/05/2019   Procedure: CARDIOVERSION;  Surgeon: Wendall Stade, MD;  Location: [facility] ENDOSCOPY;  Service: Cardiovascular;  Laterality: N/A;   CERVICAL DISC SURGERY     IR CT HEAD LTD  03/12/2021   IR PERCUTANEOUS ART THROMBECTOMY/INFUSION INTRACRANIAL INC DIAG ANGIO  03/12/2021   IR US GUIDE VASC ACCESS RIGHT  03/15/2021   LEG SURGERY     right leg and ankle   NECK SURGERY     cyst removed   RADIOLOGY WITH ANESTHESIA N/A 03/12/2021   Procedure: RADIOLOGY WITH ANESTHESIA;  Surgeon: Radiologist, Medication, MD;  Location: [facility] OR;  Service: Radiology;  Laterality: N/A;   TEE WITHOUT CARDIOVERSION N/A 02/05/2019   Procedure: TRANSESOPHAGEAL ECHOCARDIOGRAM (TEE);  Surgeon: Wendall Stade, MD;  Location: [facility] ENDOSCOPY;  Service: Cardiovascular;  Laterality: N/A;   TOTAL HIP ARTHROPLASTY     left    There were no vitals filed for this visit.   Subjective Assessment - 05/25/21 1017     Subjective "She recommended 4 more months of therapy" re: neurologist appointment    Currently in Pain? No/denies                    ADULT SLP TREATMENT - 05/25/21 1006       General Information   Behavior/Cognition Alert;Cooperative;Pleasant mood;Lethargic;Requires cueing      Treatment Provided   Treatment provided Cognitive-Linquistic      Cognitive-Linquistic Treatment   Treatment focused on Cognition;Patient/family/caregiver education    Skilled Treatment Pt stated "(the neurologist) she said 4 more months of therapy." SLP reviewed appointment note which indicated 4 month follow up with MD. Pt continues to report limited motivation and has not completing SLP recommendations (to-do lists, writing down information, etc). Pt stated "I hate writing and I keep up with it in my mind." SLP again reiterated new onset memory challenges and need for external aids. Pt verbalized understanding. No missed dosages of medication reported with use of alarms. Some improved sleep indicated. SLP targeted functional problem solving and recall task related to managing grocery list and budget. Initial mod prompting required to ID errors (specific items needed), in which pt able to identify subsequent errors without cues. Pt independently utilized calculator to add numbers. Pt had to re-calcuate due to initial inaccurate total. Extra time and mild prompting required to modify selections to adhere to budget. Pt verbalized awareness he did not attend to original list. Cognitive PROM completed with  score of 83. Pt indicated "a lot" of difficulty with reading and following complex instructions, remembering new information, making mistakes more easily, and trouble concentrating. SLP provided HEP to write down favorite channels and numbers and return to ST session.      Assessment / Recommendations / Plan   Plan Continue with current plan of care      Progression Toward Goals   Progression toward goals Not progressing toward goals (comment)   motivation, buy-in, carryover of recs               SLP Short Term Goals - 05/25/21  1007       SLP SHORT TERM GOAL #1   Title Pt will use calendar to identify and manage appointments given occasional mod A over 3 sessions    Time 2    Period Weeks    Status On-going   ongoing for all STGs as ST tx started 1 month after eval   Target Date --      SLP SHORT TERM GOAL #2   Title Pt will recall to take medications with use of external aids given occasional min A over 3 sessions    Baseline 05-18-21, 05-25-21    Time 2    Period Weeks    Status On-going    Target Date --      SLP SHORT TERM GOAL #3   Title Pt will problem solve possible or encountered household scenarios to optimize completion of daily tasks given occasional min A over 2 sessions    Time 2    Period Weeks    Status On-going    Target Date --      SLP SHORT TERM GOAL #4   Title Pt will initiate completion of 1-2 household tasks per day given occasional mod A from family over 2 sessions    Time 2    Period Weeks    Status On-going    Target Date --      SLP SHORT TERM GOAL #5   Title Pt/caregiver will complete cognitive PROM in first few ST sessions    Baseline PROM=83    Time 2    Period Weeks    Status Achieved    Target Date --              SLP Long Term Goals - 05/25/21 1009       SLP LONG TERM GOAL #1   Title Pt will use memory compensations for managment of appointments, medications, and daily tasks given occasional min A over 2 sessions    Time 6    Period Weeks    Status On-going      SLP LONG TERM GOAL #2   Title Pt will verbalize and demonstrate appropriate problem solving to improve completion of household tasks given rare min A over 2 sessions    Time 6    Period Weeks    Status On-going      SLP LONG TERM GOAL #3   Title Pt/caregiver will report less reliance on family to complete previously independent household tasks given occasional min A over 2 sessions    Time 6    Period Weeks    Status On-going              Plan - 05/25/21 1007     Clinical  Impression Statement Patrick Padilla continues to present with 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 Pt is reportedly using alarms to cue self to take medications, which  has been effective for taking medications per patient. Ongoing education and training completed for strategies to improve his independence with household tasks, with limited carryover reported secondary to motivation. Motivation and buy-in may be limiting factor to progress in ST. Continue skilled ST to maximize cognition for independence, safety, and reduce caregiver burden from daughter in law who has an infant at home as well as caring for [place].    Speech Therapy Frequency 2x / week    Duration 8 weeks    Treatment/Interventions Compensatory strategies;Functional tasks;Patient/family education;Language facilitation;Compensatory techniques;Internal/external aids;SLP instruction and feedback;Cognitive reorganization;Environmental controls    Potential to [place]    Potential Considerations Cooperation/participation level;Previous level of function    Consulted and Agree with Plan of Care Patient             Patient will benefit from skilled therapeutic intervention in order to improve the following deficits and impairments:   Cognitive communication deficit    Problem List Patient Active Problem List   Diagnosis Date Noted   Dysphagia    Leucocytosis    Benign essential HTN    Acute gout of right knee    Acute ischemic right MCA stroke ([place]) 03/12/2021   Alcohol abuse    Seizure ([place])    Essential hypertension 12/26/2019   Longstanding persistent atrial fibrillation ([place])    Atrial fibrillation with RVR ([place]) 02/03/2019   Hyperlipidemia 02/03/2019   Anxiety 02/03/2019    Alinda Deem, MA CCC-SLP 05/25/2021, 11:04 AM  [facility] [address] [place], [place], [address] Phone: [phone]   Fax:  [phone]   Name: Patrick Padilla MRN: [id number] Date of Birth: [birth year]

## 2021-05-25 NOTE — Patient Instructions (Addendum)
Write down your favorite TV channels (number and name of channel)             Please complete the assigned speech therapy homework prior to your next session and return it to the speech therapist at your next visit.

## 2021-05-25 NOTE — Patient Instructions (Signed)
Access Code: [facility] URL: [email or web address] Date: 05/25/2021 Prepared by: Gustavus Bryant  Exercises Tandem Walking with Counter Support - 2 x daily - 7 x weekly - 2 sets - 10 reps Sit to Stand with Arms Crossed - 2 x daily - 7 x weekly - 2 sets - 5 reps Supine Bridge - 2 x daily - 7 x weekly - 2 sets - 15 reps Sidelying Hip Abduction - 2 x daily - 7 x weekly - 2 sets - 15 reps

## 2021-05-25 NOTE — Therapy (Signed)
[facility] [address] [place], [place], [address] Phone: [phone]   Fax:  [phone]  Physical Therapy Treatment  Patient Details  Name: Patrick Padilla MRN: [id number] Date of Birth: April 06, 1956 No data recorded  Encounter Date: 05/25/2021   PT End of Session - 05/25/21 1122     Visit Number 9    Number of Visits 13    Date for PT Re-Evaluation 06/01/21    Authorization Type MCR/MCD    Progress Note Due on Visit 10    PT Start Time 1100    PT Stop Time 1145    PT Time Calculation (min) 45 min    Activity Tolerance Patient tolerated treatment well             Past Medical History:  Diagnosis Date   Anxiety    Elevated LFTs    History of colon polyps    HLD (hyperlipidemia)    HTN (hypertension)    Localized superficial swelling, mass, or lump     Past Surgical History:  Procedure Laterality Date   CARDIOVERSION N/A 02/05/2019   Procedure: CARDIOVERSION;  Surgeon: Josue Hector, MD;  Location: [facility];  Service: Cardiovascular;  Laterality: N/A;   CERVICAL DISC SURGERY     IR CT HEAD LTD  03/12/2021   IR PERCUTANEOUS ART THROMBECTOMY/INFUSION INTRACRANIAL INC DIAG ANGIO  03/12/2021   IR US GUIDE VASC ACCESS RIGHT  03/15/2021   LEG SURGERY     right leg and ankle   NECK SURGERY     cyst removed   RADIOLOGY WITH ANESTHESIA N/A 03/12/2021   Procedure: RADIOLOGY WITH ANESTHESIA;  Surgeon: Radiologist, Medication, MD;  Location: [facility];  Service: Radiology;  Laterality: N/A;   TEE WITHOUT CARDIOVERSION N/A 02/05/2019   Procedure: TRANSESOPHAGEAL ECHOCARDIOGRAM (TEE);  Surgeon: Josue Hector, MD;  Location: [facility] ENDOSCOPY;  Service: Cardiovascular;  Laterality: N/A;   TOTAL HIP ARTHROPLASTY     left    Vitals:   05/25/21 1127  BP: 116/76     Subjective Assessment - 05/25/21 1107     Subjective Has been cleared to drive by MD([facility]). Main concern is back pain, dizziness essentially resolved afetr BP  meds adjusted    Patient is accompained by: Family member    Pertinent History MRI+acute infarct at the right basal ganglia. Few and  punctate bifrontal peripheral infarcts. Recent medial left thalamic infarct which may be subacute. 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    Limitations Standing;Walking    How long can you sit comfortably? >15 min    How long can you stand comfortably? <15 min    How long can you walk comfortably? <15 min    Patient Stated Goals To become more steady on his feet and return to outside ambulation    Pain Onset More than a month ago                               [facility] Adult PT Treatment/Exercise - 05/25/21 0001       Transfers   Transfers Sit to Stand;Stand to Sit    Sit to Stand 6: Modified independent (Device/Increase time)    Stand to Sit 6: Modified independent (Device/Increase time)      Ambulation/Gait   Ambulation/Gait Yes    Ambulation/Gait Assistance 5: Supervision    Ambulation Distance (Feet) 400 Feet    Assistive device None  Gait Pattern Step-through pattern;Decreased arm swing - right;Decreased arm swing - left;Decreased step length - right;Decreased step length - left;Decreased dorsiflexion - right;Decreased dorsiflexion - left;Lateral hip instability    Ambulation Surface Level;Indoor      Lumbar Exercises: Supine   Bridge 10 reps;Limitations    Bridge Limitations initial discomfort resolved with reps      Knee/Hip Exercises: Aerobic   Stepper Scifit UE/LE level 4 x 8 minutes with goal >/= 60 steps per minute for strengthening and activity tolerance. seat 20 arms 7      Knee/Hip Exercises: Sidelying   Hip ABduction Strengthening;Both;2 sets;15 reps    Clams 1x30 B                 Balance Exercises - 05/25/21 0001       Balance Exercises: Standing   Standing Eyes Closed Wide (BOA);Head turns;Foam/compliant surface;Solid surface;5 reps;30  secs;Limitations    Standing Eyes Closed Limitations 30s static stand, 5 head turns/nods,    Stepping Strategy Anterior;10 reps;Limitations    Stepping Strategy Limitations No UE support                  PT Short Term Goals - 04/29/21 1104       PT SHORT TERM GOAL #1   Title Patient to become Ind in initial HEP    Baseline [place]; 04/29/21 Compliant with HEP    Time 3    Period Weeks    Status Achieved    Target Date 05/11/21      PT SHORT TERM GOAL #2   Title Patient to negotiate 16 steps with most appropriate pattern and support    Baseline 8 steps with alternating pattern and B rails; 04/25/21 16 steps with single rail and step through pattern    Time 3    Period Weeks    Status Achieved    Target Date 05/11/21      PT SHORT TERM GOAL #3   Title Patient to transfer STS 1x w/o UE support    Baseline Requires BUE support to stand; 04/25/21 Able to stand once arms crossed followed by 4 more with CGA    Time 3    Period Weeks    Status Achieved    Target Date 05/11/21      PT SHORT TERM GOAL #4   Title Patient to ambulate 568f across outdoor surfaces w/o need of AD    Baseline 2032findoors w/o need of AD; 04/29/21 Ambulation of 100080fcross paved and grassy surfaces w/o need of AD    Time 3    Period Weeks    Status Achieved    Target Date 06/01/21               PT Long Term Goals - 05/20/21 1026       PT LONG TERM GOAL #1   Title Patient to decreased 5x STS time to <12s w/o UE assist    Baseline 15.4s with BUE assist; 05/10/21 13.0s    Time 6    Period Weeks    Status Partially Met      PT LONG TERM GOAL #2   Title Patient to increase score of FGA to 26/30    Baseline 20/30; 05/10/21 FGA score 25/30    Time 6    Period Weeks    Status On-going      PT LONG TERM GOAL #3   Title Patient to negotiate 16 steps with alternting pattern w/o UE support  Baseline 8 steps with alternating pattern and B UE support; 05/10/21 Able to negotiate 16  steps with single UE support    Time 6    Period Weeks    Status Partially Met      PT LONG TERM GOAL #4   Title Ambulate 1053f across outdoor surfaces w/o need of AD    Baseline 2085fin clinic w/o AD; 05/10/21 120024fcross sloped and grass surfaces w/o AD under S    Time 6    Period Weeks    Status On-going      PT LONG TERM GOAL #5   Title Increase FOTO score to 71    Baseline Initial score 55    Time 6    Period Weeks    Status On-going                   Plan - 05/25/21 1405     Clinical Impression Statement Assessed gait for abnormalities/weakness showing R hip abduction weakness.  Issued bridging and abduction exercises for both hips.  Assessed static balance in EC/EO positions with head turns w/o LOB noted.  Still showing fifficulty with foot placement with stepping tasks due to occasional misstep    Personal Factors and Comorbidities Comorbidity 1    Comorbidities CVA    Examination-Activity Limitations Locomotion Level;Transfers    Stability/Clinical Decision Making Stable/Uncomplicated    Rehab Potential Good    PT Frequency 2x / week    PT Duration 6 weeks    PT Treatment/Interventions ADLs/Self Care Home Management;Aquatic Therapy;DME Instruction;Gait training;Stair training;Functional mobility training;Therapeutic activities;Balance training;Neuromuscular re-education;Therapeutic exercise;Patient/family education    PT Next Visit Plan 10th visit note, floor ladder, hurdles, gait with head turn/nods, dual tasking    PT Home Exercise Plan [facility]          Patient will benefit from skilled therapeutic intervention in order to improve the following deficits and impairments:  Abnormal gait, Difficulty walking, Decreased endurance, Decreased activity tolerance, Decreased balance, Decreased strength  Visit Diagnosis: Unsteadiness on feet  Muscle weakness (generalized)     Problem List Patient Active Problem List   Diagnosis Date Noted    Dysphagia    Leucocytosis    Benign essential HTN    Acute gout of right knee    Acute ischemic right MCA stroke ([facility]9/[place]2020   Alcohol abuse    Seizure ([facility]  Essential hypertension 12/26/2019   Longstanding persistent atrial fibrillation ([facility]  Atrial fibrillation with RVR ([facility] [place]7/27/2020   Hyperlipidemia 02/03/2019   Anxiety 02/03/2019    JefLanice ShirtsT 05/25/2021, 3:38 PM  [place]one: [phone]:  [phone]: JoeBristol SoyN: [id number] of Birth: [birth year]

## 2021-05-27 ENCOUNTER — Ambulatory Visit: Payer: Medicare Other | Admitting: Occupational Therapy

## 2021-05-27 ENCOUNTER — Other Ambulatory Visit: Payer: Self-pay

## 2021-05-27 ENCOUNTER — Telehealth: Payer: Self-pay | Admitting: Family Medicine

## 2021-05-27 ENCOUNTER — Encounter: Payer: Self-pay | Admitting: Occupational Therapy

## 2021-05-27 ENCOUNTER — Ambulatory Visit: Payer: Medicare Other

## 2021-05-27 VITALS — BP 108/68

## 2021-05-27 DIAGNOSIS — R2681 Unsteadiness on feet: Secondary | ICD-10-CM

## 2021-05-27 DIAGNOSIS — M25612 Stiffness of left shoulder, not elsewhere classified: Secondary | ICD-10-CM

## 2021-05-27 DIAGNOSIS — R41841 Cognitive communication deficit: Secondary | ICD-10-CM

## 2021-05-27 DIAGNOSIS — R41840 Attention and concentration deficit: Secondary | ICD-10-CM

## 2021-05-27 DIAGNOSIS — R2689 Other abnormalities of gait and mobility: Secondary | ICD-10-CM | POA: Diagnosis not present

## 2021-05-27 DIAGNOSIS — M25512 Pain in left shoulder: Secondary | ICD-10-CM

## 2021-05-27 DIAGNOSIS — M6281 Muscle weakness (generalized): Secondary | ICD-10-CM | POA: Diagnosis not present

## 2021-05-27 NOTE — Therapy (Signed)
[facility] [address] [place], [place], [address] Phone: [phone]   Fax:  [phone]  Speech Language Pathology Treatment  Patient Details  Name: Patrick Padilla MRN: [id number] Date of Birth: [birth year] Referring Provider (SLP): Cathlyn Parsons, PA-C   Encounter Date: 05/27/2021   End of Session - 05/27/21 1054     Visit Number 6    Number of Visits 17    Date for SLP Re-Evaluation 06/10/21    SLP Start Time 0936   5 minutes late   SLP Stop Time  1015    SLP Time Calculation (min) 39 min    Activity Tolerance Patient tolerated treatment well             Past Medical History:  Diagnosis Date   Anxiety    Elevated LFTs    History of colon polyps    HLD (hyperlipidemia)    HTN (hypertension)    Localized superficial swelling, mass, or lump     Past Surgical History:  Procedure Laterality Date   CARDIOVERSION N/A 02/05/2019   Procedure: CARDIOVERSION;  Surgeon: Josue Hector, MD;  Location: [facility];  Service: Cardiovascular;  Laterality: N/A;   CERVICAL DISC SURGERY     IR CT HEAD LTD  03/12/2021   IR PERCUTANEOUS ART THROMBECTOMY/INFUSION INTRACRANIAL INC DIAG ANGIO  03/12/2021   IR US GUIDE VASC ACCESS RIGHT  03/15/2021   LEG SURGERY     right leg and ankle   NECK SURGERY     cyst removed   RADIOLOGY WITH ANESTHESIA N/A 03/12/2021   Procedure: RADIOLOGY WITH ANESTHESIA;  Surgeon: Radiologist, Medication, MD;  Location: [facility];  Service: Radiology;  Laterality: N/A;   TEE WITHOUT CARDIOVERSION N/A 02/05/2019   Procedure: TRANSESOPHAGEAL ECHOCARDIOGRAM (TEE);  Surgeon: Josue Hector, MD;  Location: [facility] ENDOSCOPY;  Service: Cardiovascular;  Laterality: N/A;   TOTAL HIP ARTHROPLASTY     left    There were no vitals filed for this visit.   Subjective Assessment - 05/27/21 0945     Subjective "I got some of it done but not all the channels." "I was raised with ADD." Arrived 5 mintues latle     Currently in Pain? Yes    Pain Score 3    "They ran me pretty good here yesterday."   Pain Location Shoulder    Pain Orientation Right;Left    Pain Descriptors / Indicators Aching    Pain Type Acute pain                   ADULT SLP TREATMENT - 05/27/21 0955       General Information   Behavior/Cognition Alert;Cooperative;Pleasant mood;Lethargic;Requires cueing      Treatment Provided   Treatment provided Cognitive-Linquistic      Cognitive-Linquistic Treatment   Treatment focused on Cognition;Patient/family/caregiver education    Skilled Treatment Pt req'd usual min A for error awareness with homework. Pt reports "I was raised with ADD," Pt reported that "Very rarely" has to have dtr in law cue him to take meds. Pt states that alarm on his phone is helpful for him to take (especially) PM meds. Pt's medication alarm went off during his session. "I took them before I came." SLP asked pt about laundry "I'm all caught up with that. I can't tell you it's folded." Pt relayed to SLP he has been cooking more and SLP cautioned pt to set reminders to turn off oven or stove. He told SLP he  left on a burner last week. Pt stated he could set a timer for remembering to turn off the stove when the food was ready "but I go by heart, not by time." SLP cued pt to set the timer on his phone at highest volume for reminder to turn off the stove. SLP reiterated pt could injure himself or his posessions by using the stove with decr'd attention to detail. Pt appears today with low motivation for change.      Assessment / Recommendations / Plan   Plan Continue with current plan of care      Progression Toward Goals   Progression toward goals Not progressing toward goals (comment)   cooperation/participation with homework             SLP Education - 05/27/21 1053     Education Details don't use stove unless you have compensations for turning it off    Person(s) Educated Patient    Methods  Explanation;Handout    Comprehension Verbalized understanding;Need further instruction              SLP Short Term Goals - 05/27/21 1056       SLP SHORT TERM GOAL #1   Title Pt will use calendar to identify and manage appointments given occasional mod A over 3 sessions    Time 2    Period Weeks    Status On-going   ongoing for all STGs as ST tx started 1 month after eval     SLP SHORT TERM GOAL #2   Title Pt will recall to take medications with use of external aids given occasional min A over 3 sessions    Baseline 05-18-21, 05-25-21    Status Achieved      SLP SHORT TERM GOAL #3   Title Pt will problem solve possible or encountered household scenarios to optimize completion of daily tasks given occasional min A over 2 sessions    Time 2    Period Weeks    Status On-going      SLP SHORT TERM GOAL #4   Title Pt will initiate completion of 1-2 household tasks per day given occasional mod A from family over 2 sessions    Time 2    Period Weeks    Status On-going      SLP SHORT TERM GOAL #5   Title Pt/caregiver will complete cognitive PROM in first few ST sessions    Baseline PROM=83    Time 2    Period Weeks    Status Achieved              SLP Long Term Goals - 05/27/21 1057       SLP LONG TERM GOAL #1   Title Pt will use memory compensations for managment of appointments, medications, and daily tasks given occasional min A over 2 sessions    Time 6    Period Weeks    Status On-going    Target Date 06/10/21      SLP LONG TERM GOAL #2   Title Pt will verbalize and demonstrate appropriate problem solving to improve completion of household tasks given rare min A over 2 sessions    Time 6    Period Weeks    Status On-going    Target Date 06/10/21      SLP LONG TERM GOAL #3   Title Pt/caregiver will report less reliance on family to complete previously independent household tasks given occasional min A over 2 sessions  Time 6    Period Weeks    Status  On-going    Target Date 06/10/21              Plan - 05/27/21 1055     Clinical Impression Statement Patrick Padilla continues to present with 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 Pt is reportedly using alarms to cue self to take medications, which has been effective for taking medications per patient. Ongoing education and training completed for strategies to improve his independence with household tasks, with limited carryover reported secondary to motivation. Motivation and buy-in may be limiting factor to progress in ST. Today pt shared he has been cooking using hte stove and oven. SLP told pt not to use stove or oven if a compensatory measure is not in place (SLP suggested timer or alarm on his phone) to turn off stove or oven, as pt told SLP he left one eye of the stove on last week, after he was done cooking. Continue skilled ST to maximize cognition for independence, safety, and reduce caregiver burden from daughter in law who has an infant at home as well as caring for [place].    Speech Therapy Frequency 2x / week    Duration 8 weeks    Treatment/Interventions Compensatory strategies;Functional tasks;Patient/family education;Language facilitation;Compensatory techniques;Internal/external aids;SLP instruction and feedback;Cognitive reorganization;Environmental controls    Potential to Achieve Goals Fair    Potential Considerations Cooperation/participation level;Previous level of function    Consulted and Agree with Plan of Care Patient             Patient will benefit from skilled therapeutic intervention in order to improve the following deficits and impairments:   Cognitive communication deficit    Problem List Patient Active Problem List   Diagnosis Date Noted   Dysphagia    Leucocytosis    Benign essential HTN    Acute gout of right knee    Acute ischemic right MCA stroke (HCC) 03/12/2021   Alcohol abuse    Seizure (HCC)    Essential hypertension 12/26/2019    Longstanding persistent atrial fibrillation (HCC)    Atrial fibrillation with RVR (HCC) 02/03/2019   Hyperlipidemia 02/03/2019   Anxiety 02/03/2019    Clydia Nieves ,MS, CCC-SLP  05/27/2021, 10:58 AM  [facility] [address] [place], [place], [address] Phone: [phone]   Fax:  [phone]   Name: Patrick Padilla MRN: [id number] Date of Birth: [birth year]

## 2021-05-27 NOTE — Telephone Encounter (Signed)
Advice worker

## 2021-05-27 NOTE — Therapy (Addendum)
[facility] [address] [place], [place], [address] Phone: [phone]   Fax:  [phone]  Physical Therapy Treatment/10th Visit Progress note  Patient Details  Name: Patrick Padilla MRN: [id number] Date of Birth: 11-12-1955 No data recorded  Encounter Date: 05/27/2021 Physical Therapy Progress Note   Dates of Reporting Period: 04/13/21-05/27/21  See Note below for Objective Data and Assessment of Progress/Goals.      05/27/21 1224  PT Visits / Re-Eval  Visit Number 10  Number of Visits 13  Date for PT Re-Evaluation 06/01/21  Authorization  Authorization Type MCR/MCD  Progress Note Due on Visit 10  PT Time Calculation  PT Start Time 1015  PT Stop Time 1100  PT Time Calculation (min) 45 min  PT - End of Session  Equipment Utilized During Treatment Gait belt  Activity Tolerance Patient tolerated treatment well  Behavior During Therapy WFL for tasks assessed/performed    Past Medical History:  Diagnosis Date   Anxiety    Elevated LFTs    History of colon polyps    HLD (hyperlipidemia)    HTN (hypertension)    Localized superficial swelling, mass, or lump     Past Surgical History:  Procedure Laterality Date   CARDIOVERSION N/A 02/05/2019   Procedure: CARDIOVERSION;  Surgeon: Josue Hector, MD;  Location: [facility];  Service: Cardiovascular;  Laterality: N/A;   CERVICAL DISC SURGERY     IR CT HEAD LTD  03/12/2021   IR PERCUTANEOUS ART THROMBECTOMY/INFUSION INTRACRANIAL INC DIAG ANGIO  03/12/2021   IR US GUIDE VASC ACCESS RIGHT  03/15/2021   LEG SURGERY     right leg and ankle   NECK SURGERY     cyst removed   RADIOLOGY WITH ANESTHESIA N/A 03/12/2021   Procedure: RADIOLOGY WITH ANESTHESIA;  Surgeon: Radiologist, Medication, MD;  Location: [facility];  Service: Radiology;  Laterality: N/A;   TEE WITHOUT CARDIOVERSION N/A 02/05/2019   Procedure: TRANSESOPHAGEAL ECHOCARDIOGRAM (TEE);  Surgeon: Josue Hector,  MD;  Location: [facility] ENDOSCOPY;  Service: Cardiovascular;  Laterality: N/A;   TOTAL HIP ARTHROPLASTY     left    Vitals:   05/27/21 1023 05/27/21 1024  BP: 104/64 108/68         05/27/21 0001  Transfers  Transfers Sit to Stand;Stand to Sit  Sit to Stand 6: Modified independent (Device/Increase time)  Stand to Sit 6: Modified independent (Device/Increase time)  Ambulation/Gait  Ambulation/Gait Yes  Ambulation/Gait Assistance 5: Supervision  Ambulation Distance (Feet) 100 Feet  Assistive device None  Gait Pattern Step-through pattern;Decreased arm swing - right;Decreased arm swing - left;Decreased step length - right;Decreased step length - left;Decreased dorsiflexion - right;Decreased dorsiflexion - left;Lateral hip instability  Knee/Hip Exercises: Aerobic  Stepper Scifit UE/LE level 4.5 x 8 minutes with goal >/= 60 steps per minute for strengthening and activity tolerance. seat 20 arms 7    Attempted to assess cause of dizziness with symptoms elicited with R DHP and resultant sacccades  (upbeating?). Nausea onset with VOR 1 requiring rapid nodding motions.  Patient needing time to allow symptoms to resolve.   PT Short Term Goals - 04/29/21 1104       PT SHORT TERM GOAL #1   Title Patient to become Ind in initial [place]; 04/29/21 Compliant with HEP    Time 3    Period Weeks    Status Achieved    Target Date 05/11/21      PT SHORT TERM  GOAL #2   Title Patient to negotiate 16 steps with most appropriate pattern and support    Baseline 8 steps with alternating pattern and B rails; 04/25/21 16 steps with single rail and step through pattern    Time 3    Period Weeks    Status Achieved    Target Date 05/11/21      PT SHORT TERM GOAL #3   Title Patient to transfer STS 1x w/o UE support    Baseline Requires BUE support to stand; 04/25/21 Able to stand once arms crossed followed by 4 more with CGA    Time 3    Period Weeks    Status Achieved    Target  Date 05/11/21      PT SHORT TERM GOAL #4   Title Patient to ambulate 510f across outdoor surfaces w/o need of AD    Baseline 2059findoors w/o need of AD; 04/29/21 Ambulation of 100069fcross paved and grassy surfaces w/o need of AD    Time 3    Period Weeks    Status Achieved    Target Date 06/01/21               PT Long Term Goals - 06/08/21 1106       PT LONG TERM GOAL #1   Title Patient to decreased 5x STS time to <12s w/o UE assist    Baseline 15.4s with BUE assist; 05/10/21 13.0s; 06/08/21 Score 13.4s    Time 4    Period Weeks    Status On-going    Target Date 07/13/21      PT LONG TERM GOAL #2   Title Patient to increase score of FGA to 26/30    Baseline 20/30; 05/10/21 FGA score 25/30; 06/08/21 FGA score decreased to 19/30    Time 4    Period Weeks    Status On-going    Target Date 07/13/21      PT LONG TERM GOAL #3   Title Patient to negotiate 16 steps with alternting pattern w/o UE support    Baseline 8 steps with alternating pattern and B UE support; 05/10/21 Able to negotiate 16 steps with single UE support    Time 4    Period Weeks    Status Partially Met    Target Date 07/13/21      PT LONG TERM GOAL #4   Title Ambulate 1000f52fross outdoor surfaces w/o need of AD    Baseline 200ft65fclinic w/o AD; 05/10/21 1200ft 21fss sloped and grass surfaces w/o AD under S    Time 4    Period Weeks    Status On-going    Target Date 07/13/21      PT LONG TERM GOAL #5   Title Increase FOTO score to 71    Baseline Initial score 55    Time 4    Period Weeks    Status On-going    Target Date 07/13/21              05/27/21 0950  Plan  Clinical Impression Statement Due to continued c/o "dizziness and unstadiness", assessment performed to determing cause which began with positional testing for BPPV, + R DHP with c/o nausea requiring time to settle which required remaining time in session in order to be able to attend next OT appointment.  PAtient will  require further f/u to determine cuse of symptoms and develop appropriate interventions.  All STGs met at this time  Personal Factors and  Comorbidities Comorbidity 1;Age  Comorbidities CVA  Examination-Activity Limitations Locomotion Level;Transfers  Pt will benefit from skilled therapeutic intervention in order to improve on the following deficits Abnormal gait;Difficulty walking;Decreased endurance;Decreased activity tolerance;Decreased balance;Decreased strength  Stability/Clinical Decision Making Stable/Uncomplicated  Rehab Potential Good  PT Frequency 1x / week  PT Duration 4 weeks  PT Treatment/Interventions ADLs/Self Care Home Management;Aquatic Therapy;DME Instruction;Gait training;Stair training;Functional mobility training;Therapeutic activities;Balance training;Neuromuscular re-education;Therapeutic exercise;Patient/family education  PT Next Visit Plan assess LTGs, assess for vestibular issues, continue 1w4 to address any remaiing deficits  PT Home Exercise Plan [facility]   Patient will benefit from skilled therapeutic intervention in order to improve the following deficits and impairments:  Abnormal gait, Difficulty walking, Decreased endurance, Decreased activity tolerance, Decreased balance, Decreased strength  Visit Diagnosis: Unsteadiness on feet  Muscle weakness (generalized)  Balance disorder     Problem List Patient Active Problem List   Diagnosis Date Noted   Dysphagia    Leucocytosis    Benign essential HTN    Acute gout of right knee    Acute ischemic right MCA stroke ([place]) 03/12/2021   Alcohol abuse    Seizure ([place])    Essential hypertension 12/26/2019   Longstanding persistent atrial fibrillation ([place])    Atrial fibrillation with RVR ([place]) 02/03/2019   Hyperlipidemia 02/03/2019   Anxiety 02/03/2019    Lanice Shirts, PT 06/17/2021, 9:58 AM  [facility] [address] [place], [place],  [address] Phone: [phone]   Fax:  [phone]  Name: Patrick Padilla MRN: [id number] Date of Birth: 1956-07-02

## 2021-05-27 NOTE — Therapy (Signed)
[facility] [address] [place], [place], [address] Phone: [phone]   Fax:  [phone]  Occupational Therapy Treatment  Patient Details  Name: Patrick Padilla MRN: [id number] Date of Birth: 05-04-1956 Referring Provider (OT): Angiulli   Encounter Date: 05/27/2021   OT End of Session - 05/27/21 1101     Visit Number 9    Number of Visits 17    Date for OT Re-Evaluation 06/27/21    Authorization Type Medicare A&B, 20% coinsurance    Authorization - Visit Number 8    Progress Note Due on Visit 10    OT Start Time 1100    OT Stop Time 1145    OT Time Calculation (min) 45 min    Activity Tolerance Patient tolerated treatment well    Behavior During Therapy WFL for tasks assessed/performed             Past Medical History:  Diagnosis Date   Anxiety    Elevated LFTs    History of colon polyps    HLD (hyperlipidemia)    HTN (hypertension)    Localized superficial swelling, mass, or lump     Past Surgical History:  Procedure Laterality Date   CARDIOVERSION N/A 02/05/2019   Procedure: CARDIOVERSION;  Surgeon: Wendall Stade, MD;  Location: [facility];  Service: Cardiovascular;  Laterality: N/A;   CERVICAL DISC SURGERY     IR CT HEAD LTD  03/12/2021   IR PERCUTANEOUS ART THROMBECTOMY/INFUSION INTRACRANIAL INC DIAG ANGIO  03/12/2021   IR US GUIDE VASC ACCESS RIGHT  03/15/2021   LEG SURGERY     right leg and ankle   NECK SURGERY     cyst removed   RADIOLOGY WITH ANESTHESIA N/A 03/12/2021   Procedure: RADIOLOGY WITH ANESTHESIA;  Surgeon: Radiologist, Medication, MD;  Location: [facility] OR;  Service: Radiology;  Laterality: N/A;   TEE WITHOUT CARDIOVERSION N/A 02/05/2019   Procedure: TRANSESOPHAGEAL ECHOCARDIOGRAM (TEE);  Surgeon: Wendall Stade, MD;  Location: [facility] ENDOSCOPY;  Service: Cardiovascular;  Laterality: N/A;   TOTAL HIP ARTHROPLASTY     left    There were no vitals filed for this visit.   Subjective  Assessment - 05/27/21 1100     Subjective  "made my stomach woozie with trying to figure out things in PT" (vestibular testing)    Pertinent History HTN, Afib, Anxiety, Multiple ortho injuries    Currently in Pain? Yes    Pain Score 3     Pain Location Shoulder   and back and neck   Pain Orientation Right;Left    Pain Descriptors / Indicators Aching    Pain Type Acute pain    Pain Onset More than a month ago    Pain Frequency Intermittent             Medium Ball  x 10 reps shoulder flexion, chest press, horizontal abduction and circumduction while seated. Mid level (shoulder height)   Side Lying first on left side with shoulder stable and gentle ROM for shoulder flexion and extension with arm bent at 90 degrees and extended straight. Again completed but sidelying on right side with arm on pillows for gentle stabilization.   Supine Foam Roll x 10 reps shoulder flexion, chest press, horizontal abduction, abduction and external rotation LUE                  OT Short Term Goals - 05/18/21 1152       OT SHORT TERM GOAL #1  Title Patient will complete an HEP designed to address left shoulder pain and range of motion    Time 4    Period Weeks    Status Achieved      OT SHORT TERM GOAL #2   Title Patient will demonstrate improved safety and confidence (patient report) with transferring into and out of shower.    Time 4    Period Weeks    Status Achieved      OT SHORT TERM GOAL #3   Title Patient will demonstrate improved performance - decreased time, decreased pain in donning/doffing socks    Time 4    Period Weeks    Status Achieved   pt reports this is much better 05/13/21     OT SHORT TERM GOAL #4   Title Patient will prepare simple warm meal for himself with no more than supervision    Time 4    Period Weeks    Status Achieved               OT Long Term Goals - 05/20/21 1112       OT LONG TERM GOAL #1   Title Patient will complete updated HEP  designed to increase range of motion, strength and functional use of non-dominant LUE    Time 8    Period Weeks    Status On-going      OT LONG TERM GOAL #2   Title Patient will return to mowing 50% of his lawn with minimal assistance    Time 8    Period Weeks    Status On-going      OT LONG TERM GOAL #3   Title Patient will return to working with hand (versus power)  tools for household maintenance or work in [employer].    Time 8    Period Weeks    Status On-going   pt reports he could but has not done any work in his shop     OT LONG TERM GOAL #4   Title Patient will report decrease in pain with reaching patterns with LUE through mid to high reach with pain no more than 1-2/10.    Time 8    Period Weeks    Status On-going      OT LONG TERM GOAL #5   Title Patient will demonstrate 5 lb increase in left grip strength    Time 8    Period Weeks    Status On-going      OT LONG TERM GOAL #6   Title Patient will complete FOTO survey on discharge with at least 10 point improvement (55 UE on eval)    Time 8    Period Weeks    Status On-going                   Plan - 05/27/21 1152     Clinical Impression Statement Pt did not get any medications from neurology appt for shoulder pain. Pt continues to report aching and discomfort in LUE shoulder.    OT Occupational Profile and History Detailed Assessment- Review of Records and additional review of physical, cognitive, psychosocial history related to current functional performance    Occupational performance deficits (Please refer to evaluation for details): ADL's;IADL's;Leisure    Body Structure / Function / Physical Skills ADL;Decreased knowledge of use of DME;Strength;Balance;Dexterity;GMC;Pain;Tone;Body mechanics;Hearing;UE functional use;Cardiopulmonary status limiting activity;Endurance;IADL;ROM;Vestibular;Coordination;Flexibility;Mobility;Sensation;FMC;Decreased knowledge of precautions    Rehab Potential Good     Clinical Decision Making Several treatment options, min-mod task  modification necessary    Comorbidities Affecting Occupational Performance: May have comorbidities impacting occupational performance    Modification or Assistance to Complete Evaluation  Min-Moderate modification of tasks or assist with assess necessary to complete eval    OT Frequency 2x / week    OT Duration 8 weeks    OT Treatment/Interventions Self-care/ADL training;Moist Heat;Fluidtherapy;DME and/or AE instruction;Balance training;Aquatic Therapy;Therapeutic activities;Ultrasound;Therapeutic exercise;Cognitive remediation/compensation;Neuromuscular education;Functional Mobility Training;Passive range of motion;Visual/perceptual remediation/compensation;Electrical Stimulation;Manual Therapy;Patient/family education    Plan NMR LUE - closed to open chain, mid to high reach active assisted - in pain free ranges    Consulted and Agree with Plan of Care Patient             Patient will benefit from skilled therapeutic intervention in order to improve the following deficits and impairments:   Body Structure / Function / Physical Skills: ADL, Decreased knowledge of use of DME, Strength, Balance, Dexterity, GMC, Pain, Tone, Body mechanics, Hearing, UE functional use, Cardiopulmonary status limiting activity, Endurance, IADL, ROM, Vestibular, Coordination, Flexibility, Mobility, Sensation, FMC, Decreased knowledge of precautions       Visit Diagnosis: Unsteadiness on feet  Muscle weakness (generalized)  Acute pain of left shoulder  Attention and concentration deficit  Stiffness of left shoulder, not elsewhere classified    Problem List Patient Active Problem List   Diagnosis Date Noted   Dysphagia    Leucocytosis    Benign essential HTN    Acute gout of right knee    Acute ischemic right MCA stroke ([place]) 03/12/2021   Alcohol abuse    Seizure ([place])    Essential hypertension 12/26/2019   Longstanding persistent  atrial fibrillation ([place])    Atrial fibrillation with RVR ([place]) 02/03/2019   Hyperlipidemia 02/03/2019   Anxiety 02/03/2019    Zachery Conch, OT/L 05/27/2021, 11:53 AM  [facility] [address] [place], [place], [address] Phone: [phone]   Fax:  [phone]  Name: Patrick Padilla MRN: [id number] Date of Birth: 1956/04/11

## 2021-05-30 ENCOUNTER — Other Ambulatory Visit: Payer: Self-pay

## 2021-05-30 ENCOUNTER — Ambulatory Visit: Payer: Medicare Other | Admitting: Occupational Therapy

## 2021-05-30 ENCOUNTER — Ambulatory Visit: Payer: Medicare Other | Admitting: Physical Therapy

## 2021-05-30 ENCOUNTER — Encounter: Payer: Self-pay | Admitting: Physical Therapy

## 2021-05-30 ENCOUNTER — Ambulatory Visit: Payer: Medicare Other

## 2021-05-30 DIAGNOSIS — R41840 Attention and concentration deficit: Secondary | ICD-10-CM | POA: Diagnosis not present

## 2021-05-30 DIAGNOSIS — M6281 Muscle weakness (generalized): Secondary | ICD-10-CM

## 2021-05-30 DIAGNOSIS — R2681 Unsteadiness on feet: Secondary | ICD-10-CM

## 2021-05-30 DIAGNOSIS — M25512 Pain in left shoulder: Secondary | ICD-10-CM

## 2021-05-30 DIAGNOSIS — R41841 Cognitive communication deficit: Secondary | ICD-10-CM

## 2021-05-30 DIAGNOSIS — M25612 Stiffness of left shoulder, not elsewhere classified: Secondary | ICD-10-CM | POA: Diagnosis not present

## 2021-05-30 DIAGNOSIS — R2689 Other abnormalities of gait and mobility: Secondary | ICD-10-CM | POA: Diagnosis not present

## 2021-05-30 NOTE — Therapy (Signed)
[facility] [address] [place], [place], [address] Phone: [phone]   Fax:  [phone]  Occupational Therapy Treatment & Progress Note  Patient Details  Name: Patrick Padilla MRN: [id number] Date of Birth: 1956/06/20 Referring Provider (OT): Wayne   Encounter Date: 05/30/2021   OT End of Session - 05/30/21 1151     Visit Number 10    Number of Visits 17    Date for OT Re-Evaluation 06/27/21    Authorization Type Medicare A&B, 20% coinsurance    Authorization - Visit Number 8    Progress Note Due on Visit 10    OT Start Time 1145    OT Stop Time 1230    OT Time Calculation (min) 45 min    Activity Tolerance Patient tolerated treatment well    Behavior During Therapy [facility] for tasks assessed/performed            Occupational Therapy Progress Note  Dates of Reporting Period: 04/13/21 to 05/30/21  Objective Reports of Subjective Statement: Pt is progressing with LUE coordination, increased range of motion and movement patterns with LUE and decreaesed pain. Pt reports still having difficulty with grip strength and increased ability to complete ADLs and IADLs.  Objective Measurements: Pt has decrease grip strength on measurement today. (By 8 lbs) in LUE. Pt more comfortable with completing tasks around the house.  Goal Update: Pt has met all STGs and is still progressing towards LTGs.  Plan: Continue working on unmet goals.  Reason Skilled Services are Required: Skilled OT recommended to target LUE grip strength, LUE shoulder and reach patterns.   Past Medical History:  Diagnosis Date   Anxiety    Elevated LFTs    History of colon polyps    HLD (hyperlipidemia)    HTN (hypertension)    Localized superficial swelling, mass, or lump     Past Surgical History:  Procedure Laterality Date   CARDIOVERSION N/A 02/05/2019   Procedure: CARDIOVERSION;  Surgeon: Josue Hector, MD;  Location: [place];   Service: Cardiovascular;  Laterality: N/A;   CERVICAL DISC SURGERY     IR CT HEAD LTD  03/12/2021   IR PERCUTANEOUS ART THROMBECTOMY/INFUSION INTRACRANIAL INC DIAG ANGIO  03/12/2021   IR US GUIDE VASC ACCESS RIGHT  03/15/2021   LEG SURGERY     right leg and ankle   NECK SURGERY     cyst removed   RADIOLOGY WITH ANESTHESIA N/A 03/12/2021   Procedure: RADIOLOGY WITH ANESTHESIA;  Surgeon: Radiologist, Medication, MD;  Location: [facility];  Service: Radiology;  Laterality: N/A;   TEE WITHOUT CARDIOVERSION N/A 02/05/2019   Procedure: TRANSESOPHAGEAL ECHOCARDIOGRAM (TEE);  Surgeon: Josue Hector, MD;  Location[facility] ENDOSCOPY;  Service: Cardiovascular;  Laterality: N/A;   TOTAL HIP ARTHROPLASTY     left    There were no vitals filed for this visit.   Subjective Assessment - 05/30/21 1149     Subjective  "my weekend was uneventful" Pt denies any changes. "I got out to do some shopping."    Pertinent History HTN, Afib, Anxiety, Multiple ortho injuries    Currently in Pain? No/denies    Pain Score 0-No pain                [facility] OT Assessment - 05/30/21 0001       Hand Function   Left Hand Grip (lbs) 70.7 lbs              Checked Goals. See below.  Hand Tools in clinic- no safety concerns and with no difficulty. Hammered nail and used pliers to get out of wood.   Hand Gripper: with LUE on level 4 with black spring. Pt picked up 1 inch blocks with gripper with mod drops and mod difficulty. Downgraded to level 3 approx 50% through.  UE Ranger with LUE in different directions/planes with step patterns  Reach on cabinet with LUE to reach for post it overhead - no increase in pain  Arm Bike: for conditioning and reciprocal movements on Level 2 for 6 minutes (forward and backward)                 OT Short Term Goals - 05/18/21 1152       OT SHORT TERM GOAL #1   Title Patient will complete an HEP designed to address left shoulder pain and range of motion    Time 4     Period Weeks    Status Achieved      OT SHORT TERM GOAL #2   Title Patient will demonstrate improved safety and confidence (patient report) with transferring into and out of shower.    Time 4    Period Weeks    Status Achieved      OT SHORT TERM GOAL #3   Title Patient will demonstrate improved performance - decreased time, decreased pain in donning/doffing socks    Time 4    Period Weeks    Status Achieved   pt reports this is much better 05/13/21     OT SHORT TERM GOAL #4   Title Patient will prepare simple warm meal for himself with no more than supervision    Time 4    Period Weeks    Status Achieved               OT Long Term Goals - 05/30/21 1155       OT LONG TERM GOAL #1   Title Patient will complete updated HEP designed to increase range of motion, strength and functional use of non-dominant LUE    Time 8    Period Weeks    Status On-going      OT LONG TERM GOAL #2   Title Patient will return to mowing 50% of his lawn with minimal assistance    Time 8    Period Weeks    Status On-going      OT LONG TERM GOAL #3   Title Patient will return to working with hand (versus power)  tools for household maintenance or work in [employer].    Time 8    Period Weeks    Status On-going   worked with hammer, nail and pliers to get nail out with no difficulty 05/30/21     OT LONG TERM GOAL #4   Title Patient will report decrease in pain with reaching patterns with LUE through mid to high reach with pain no more than 1-2/10.    Time 8    Period Weeks    Status On-going   2-3 pain in LUE shoulder during reaching patterns     OT LONG TERM GOAL #5   Title Patient will demonstrate 5 lb increase in left grip strength    Baseline 78.2 lbs at eval    Time 8    Period Weeks    Status On-going   70.7 lbs with LUE 05/30/21     OT LONG TERM GOAL #6   Title Patient will complete FOTO survey on  discharge with at least 10 point improvement (55 UE on eval)    Time 8    Period  Weeks    Status On-going                   Plan - 05/30/21 1208     Clinical Impression Statement Progress Report for period 04/13/21 - 05/30/21. Pt has met all STGs and is progressing towards LTGs. Pt with continued need for improvement with LUE grip strength and LUE shoulder range of motion and reaching pattern and decreased pain. Skilled OT continues to be beneficial.    OT Occupational Profile and History Detailed Assessment- Review of Records and additional review of physical, cognitive, psychosocial history related to current functional performance    Occupational performance deficits (Please refer to evaluation for details): ADL's;IADL's;Leisure    Body Structure / Function / Physical Skills ADL;Decreased knowledge of use of DME;Strength;Balance;Dexterity;GMC;Pain;Tone;Body mechanics;Hearing;UE functional use;Cardiopulmonary status limiting activity;Endurance;IADL;ROM;Vestibular;Coordination;Flexibility;Mobility;Sensation;FMC;Decreased knowledge of precautions    Rehab Potential Good    Clinical Decision Making Several treatment options, min-mod task modification necessary    Comorbidities Affecting Occupational Performance: May have comorbidities impacting occupational performance    Modification or Assistance to Complete Evaluation  Min-Moderate modification of tasks or assist with assess necessary to complete eval    OT Frequency 2x / week    OT Duration 8 weeks    OT Treatment/Interventions Self-care/ADL training;Moist Heat;Fluidtherapy;DME and/or AE instruction;Balance training;Aquatic Therapy;Therapeutic activities;Ultrasound;Therapeutic exercise;Cognitive remediation/compensation;Neuromuscular education;Functional Mobility Training;Passive range of motion;Visual/perceptual remediation/compensation;Electrical Stimulation;Manual Therapy;Patient/family education    Plan NMR LUE - closed to open chain, mid to high reach active assisted - in pain free ranges    Consulted and Agree  with Plan of Care Patient             Patient will benefit from skilled therapeutic intervention in order to improve the following deficits and impairments:   Body Structure / Function / Physical Skills: ADL, Decreased knowledge of use of DME, Strength, Balance, Dexterity, GMC, Pain, Tone, Body mechanics, Hearing, UE functional use, Cardiopulmonary status limiting activity, Endurance, IADL, ROM, Vestibular, Coordination, Flexibility, Mobility, Sensation, FMC, Decreased knowledge of precautions       Visit Diagnosis: Unsteadiness on feet  Muscle weakness (generalized)  Acute pain of left shoulder  Stiffness of left shoulder, not elsewhere classified  Attention and concentration deficit    Problem List Patient Active Problem List   Diagnosis Date Noted   Dysphagia    Leucocytosis    Benign essential HTN    Acute gout of right knee    Acute ischemic right MCA stroke ([place]) 03/12/2021   Alcohol abuse    Seizure ([place])    Essential hypertension 12/26/2019   Longstanding persistent atrial fibrillation ([facility])    Atrial fibrillation with RVR ([place]) 02/03/2019   Hyperlipidemia 02/03/2019   Anxiety 02/03/2019    Zachery Conch, OT/L 05/30/2021, 1:50 PM  [facility] [address] [place], [place], [address] Phone: [phone]   Fax:  [phone]  Name: Patrick Padilla MRN: [id number] Date of Birth: 06-20-1956

## 2021-05-30 NOTE — Progress Notes (Signed)
Cardiology Office Note:    Date:  05/31/2021   ID:  Patrick Padilla, DOB 04/15/1956, MRN [id number]  PCP:  Dorna Mai, MD  Cardiologist:  Sanda Klein, MD   Referring MD: No ref. provider found   Chief Complaint  Patient presents with   Hospitalization Follow-up    Afib, stroke    History of Present Illness:    Patrick Padilla is a 65 y.o. male with a hx of longstanding persistent atrial fibrillation controlled with 100 mg metoprolol twice daily, chronic anticoagulation with 5 mg Eliquis twice daily, hypertension, hyperlipidemia, prior stroke, EtOH abuse, and ongoing tobacco use.  He was hospitalized July 2020 with chest pain and A. fib RVR.  He underwent TEE guided cardioversion on February 05, 2019 but had return of now persistent atrial fibrillation.  He is generally unaware of his arrhythmia.  He has focused on rate control rather than rhythm control with Dr. Sallyanne Kuster.  He was recently hospitalized in September 2022 with confusion found to be in A. fib RVR by EMS.  Cardioversion was attempted in route to the ER.  He was hemiplegic with right upper extremity jerking, obtunded with hypoxic respiratory failure requiring intubation.  He was found to have a right MCA infarct with penumbra and underwent cerebral angio with thrombectomy.  He was started on Keppra for seizure prophylaxis.  CIWA protocol initiated for EtOH withdrawal.  Embolic stroke felt due to A. fib status post cardioversion.  Follow-up MRI head showed acute infarct in right basal ganglia and recent left thalamic infarct question subacute as well as press appearance and bilateral parieto-occipital cortex.  He was started on aspirin due to moderate size stroke with petechial hemorrhage.  He had bouts of A. fib and was started on IV heparin and transition to Eliquis.  He to outpatient rehab on 04/03/2019.   He called our office stating he was unable to afford Eliquis and was given samples.  Apparently his Medicare started on 05/10/2021.   He was referred to community health and wellness for medication assistance.  He presents for hospital follow-up.  He has mild left upper extremity weakness and sensory impairment following his recent stroke.  He continues to work with PT/OT/SLP.  Driving restrictions managed by neurology.  PCP recently lowered his amlodipine dose to 5 mg.    He reports taking 5 mg amlodipine. He continues to have orthostatic symptoms. Will stop amlodipine altogether and monitor his BP. He reports not being on eliquis prior to September due to affordability issues. Interruption in [facility] likely played a role in his CVA. Discussed importance of [place].    Past Medical History:  Diagnosis Date   Anxiety    Elevated LFTs    History of colon polyps    HLD (hyperlipidemia)    HTN (hypertension)    Localized superficial swelling, mass, or lump     Past Surgical History:  Procedure Laterality Date   CARDIOVERSION N/A 02/05/2019   Procedure: CARDIOVERSION;  Surgeon: Josue Hector, MD;  Location: [facility];  Service: Cardiovascular;  Laterality: N/A;   CERVICAL DISC SURGERY     IR CT HEAD LTD  03/12/2021   IR PERCUTANEOUS ART THROMBECTOMY/INFUSION INTRACRANIAL INC DIAG ANGIO  03/12/2021   IR US GUIDE VASC ACCESS RIGHT  03/15/2021   LEG SURGERY     right leg and ankle   NECK SURGERY     cyst removed   RADIOLOGY WITH ANESTHESIA N/A 03/12/2021   Procedure: RADIOLOGY WITH ANESTHESIA;  Surgeon: Radiologist, Medication, MD;  Location: [facility] OR;  Service: Radiology;  Laterality: N/A;   TEE WITHOUT CARDIOVERSION N/A 02/05/2019   Procedure: TRANSESOPHAGEAL ECHOCARDIOGRAM (TEE);  Surgeon: Wendall Stade, MD;  Location: [facility] ENDOSCOPY;  Service: Cardiovascular;  Laterality: N/A;   TOTAL HIP ARTHROPLASTY     left    Current Medications: Current Meds  Medication Sig   apixaban (ELIQUIS) 5 MG TABS tablet Take 1 tablet (5 mg total) by mouth 2 (two) times daily.   atorvastatin (LIPITOR) 40 MG tablet Take 1 tablet (40 mg total) by  mouth daily.   folic acid (FOLVITE) 1 MG tablet Take 1 tablet (1 mg total) by mouth daily.   irbesartan (AVAPRO) 75 MG tablet Take 1 tablet (75 mg total) by mouth daily.   metoprolol tartrate (LOPRESSOR) 100 MG tablet Take 1 tablet (100 mg total) by mouth 2 (two) times daily.   Multiple Vitamin (MULTIVITAMIN WITH MINERALS) TABS tablet Take 1 tablet by mouth daily.   PARoxetine (PAXIL) 30 MG tablet Take 1 tablet (30 mg total) by mouth daily.   telmisartan (MICARDIS) 40 MG tablet Take 40 mg by mouth daily.   [DISCONTINUED] amLODipine (NORVASC) 10 MG tablet Take 1 tablet (10 mg total) by mouth daily. (Patient taking differently: Take 5 mg by mouth daily.)     Allergies:   Patient has no known allergies.   Social History   Socioeconomic History   Marital status: Married    Spouse name: Not on file   Number of children: 2   Years of education: Not on file   Highest education level: Not on file  Occupational History   Occupation: disabled    Employer: SELF-EMPLOYED  Tobacco Use   Smoking status: Every Day   Smokeless tobacco: Never  Vaping Use   Vaping Use: Never used  Substance and Sexual Activity   Alcohol use: Yes    Comment: 3-4 a day   Drug use: No   Sexual activity: Not on file  Other Topics Concern   Not on file  Social History Narrative   Not on file   Social Determinants of Health   Financial Resource Strain: Not on file  Food Insecurity: Not on file  Transportation Needs: Not on file  Physical Activity: Not on file  Stress: Not on file  Social Connections: Not on file     Family History: The patient's family history includes Clotting disorder in his mother; Colon cancer in his father; Heart disease in his brother; Hepatitis in his mother; Rheumatic fever in his brother; Stroke in his mother; Thyroid disease in his mother.  ROS:   Please see the history of present illness.     All other systems reviewed and are negative.  EKGs/Labs/Other Studies Reviewed:     The following studies were reviewed today:  Echo 03/14/21:  1. Left ventricular ejection fraction, by estimation, is 60 to 65%. The  left ventricle has normal function. The left ventricle has no regional  wall motion abnormalities. There is mild left ventricular hypertrophy.  Left ventricular diastolic parameters  are indeterminate.   2. Right ventricular systolic function is normal. The right ventricular  size is normal.   3. Right atrial size was mildly dilated.   4. The mitral valve is abnormal. Trivial mitral valve regurgitation. No  evidence of mitral stenosis.   5. The aortic valve was not well visualized. There is mild calcification  of the aortic valve. Aortic valve regurgitation is not visualized. Mild  aortic valve sclerosis is present, with  no evidence of aortic valve  stenosis.   6. The inferior vena cava is normal in size with greater than 50%  respiratory variability, suggesting right atrial pressure of 3 mmHg.   EKG:  EKG is  ordered today.  The ekg ordered today demonstrates atrial fibrillation with ventricular rate 79  Recent Labs: 03/12/2021: B Natriuretic Peptide 348.9; TSH 3.703 03/18/2021: ALT 16 03/19/2021: Magnesium 2.0 03/28/2021: Hemoglobin 16.7; Platelets 563 04/02/2021: BUN 24; Creatinine, Ser 1.09; Potassium 4.6; Sodium 137  Recent Lipid Panel    Component Value Date/Time   CHOL 162 03/13/2021 0241   TRIG 92 03/13/2021 0241   TRIG 92 03/13/2021 0241   HDL 47 03/13/2021 0241   CHOLHDL 3.4 03/13/2021 0241   VLDL 18 03/13/2021 0241   LDLCALC 97 03/13/2021 0241    Physical Exam:    VS:  BP 106/62   Pulse 79   Ht 6' (1.829 m)   Wt 203 lb (92.1 kg)   SpO2 98%   BMI 27.53 kg/m     Wt Readings from Last 3 Encounters:  05/31/21 203 lb (92.1 kg)  05/23/21 203 lb (92.1 kg)  05/16/21 199 lb (90.3 kg)     GEN: elderly male In NAD HEENT: Normal NECK: No JVD; No carotid bruits LYMPHATICS: No lymphadenopathy CARDIAC: irregular rhythm, regular rate,  no murmur RESPIRATORY:  Clear to auscultation without rales, wheezing or rhonchi  ABDOMEN: Soft, non-tender, non-distended MUSCULOSKELETAL:  No edema; No deformity  SKIN: Warm and dry NEUROLOGIC:  Alert and oriented x 3 PSYCHIATRIC:  Normal affect   ASSESSMENT:    1. Longstanding persistent atrial fibrillation (HCC)   2. Persistent atrial fibrillation ([place])   3. Chronic anticoagulation   4. Cerebrovascular accident (CVA), unspecified mechanism ([place])   5. Essential hypertension   6. Pure hypercholesterolemia   7. Alcohol abuse   8. Alcohol use   9. Smoker    PLAN:    In order of problems listed above:  Persistent atrial fibrillation Rate controlled with BB EKG with Afib today. He is asymptomatic with this.    Chronic anticoagulation Recent CVA This patients CHA2DS2-VASc Score and unadjusted Ischemic Stroke Rate (% per year) is equal to 4.8 % stroke rate/year from a score of 4 (age, HTN, 2CVA) No bleeding issues on eliquis. Continue [place].  He reports not being on eliquis prior to his stroke in Sept.     Hypertension PCP recently reduced amlodipine to 5 mg. Continue 75 mg irbesartan and 100 mg lopressor BID He reports continues orthostatic symptoms. Want to avoid falling on [place], will stop amlodipine altogether. Will provide BP machine today. Instructed him to keep a BP log.    Hyperlipidemia with LDL goal < 70 03/13/2021: Cholesterol 162; HDL 47; LDL Cholesterol 97; Triglycerides 92; Triglycerides 92; VLDL 18 Continue 40 mg lipitor   Alcohol use Tobacco use - has cut back on both - advised cessation for both    Follow up in 3 months.  Will see Dr. Sallyanne Kuster or male provider going forward.     Medication Adjustments/Labs and Tests Ordered: Current medicines are reviewed at length with the patient today.  Concerns regarding medicines are outlined above.  Orders Placed This Encounter  Procedures   EKG 12-Lead   No orders of the defined types were placed in this  encounter.   Signed, Ledora Bottcher, [place]  05/31/2021 11:44 AM    [facility]

## 2021-05-30 NOTE — Patient Instructions (Signed)
Access Code: [facility] URL: [email or web address] Date: 05/30/2021 Prepared by: Waldon Merl  Exercises Tandem Walking with Counter Support - 2 x daily - 7 x weekly - 2 sets - 10 reps Sit to Stand with Arms Crossed - 2 x daily - 7 x weekly - 2 sets - 5 reps Supine Bridge - 2 x daily - 7 x weekly - 2 sets - 15 reps Sidelying Hip Abduction - 2 x daily - 7 x weekly - 2 sets - 15 reps  Added exercise below 05/30/21 with cues for technique: Seated Hamstring Stretch - 1-2 x daily - 5 x weekly - 2 sets - 10 reps - 30-42min hold

## 2021-05-30 NOTE — Therapy (Signed)
[facility] [address] [place], [place], [address] Phone: [phone]   Fax:  [phone]  Speech Language Pathology Treatment  Patient Details  Name: Patrick Padilla MRN: [id number] Date of Birth: Nov 13, 1955 Referring Provider (SLP): Cathlyn Parsons, PA-C   Encounter Date: 05/30/2021   End of Session - 05/30/21 1025     Visit Number 7    Number of Visits 17    Date for SLP Re-Evaluation 06/10/21    SLP Start Time [id number]   pt arrived late   SLP Stop Time  1100    SLP Time Calculation (min) 37 min    Activity Tolerance Patient tolerated treatment well             Past Medical History:  Diagnosis Date   Anxiety    Elevated LFTs    History of colon polyps    HLD (hyperlipidemia)    HTN (hypertension)    Localized superficial swelling, mass, or lump     Past Surgical History:  Procedure Laterality Date   CARDIOVERSION N/A 02/05/2019   Procedure: CARDIOVERSION;  Surgeon: Josue Hector, MD;  Location: [facility];  Service: Cardiovascular;  Laterality: N/A;   CERVICAL DISC SURGERY     IR CT HEAD LTD  03/12/2021   IR PERCUTANEOUS ART THROMBECTOMY/INFUSION INTRACRANIAL INC DIAG ANGIO  03/12/2021   IR US GUIDE VASC ACCESS RIGHT  03/15/2021   LEG SURGERY     right leg and ankle   NECK SURGERY     cyst removed   RADIOLOGY WITH ANESTHESIA N/A 03/12/2021   Procedure: RADIOLOGY WITH ANESTHESIA;  Surgeon: Radiologist, Medication, MD;  Location: [facility];  Service: Radiology;  Laterality: N/A;   TEE WITHOUT CARDIOVERSION N/A 02/05/2019   Procedure: TRANSESOPHAGEAL ECHOCARDIOGRAM (TEE);  Surgeon: Josue Hector, MD;  Location: [facility] ENDOSCOPY;  Service: Cardiovascular;  Laterality: N/A;   TOTAL HIP ARTHROPLASTY     left    There were no vitals filed for this visit.   Subjective Assessment - 05/30/21 1023     Subjective "I think it may be Patrick Padilla" arrived late    Currently in Pain? No/denies                    ADULT SLP TREATMENT - 05/30/21 1025       General Information   Behavior/Cognition Alert;Cooperative;Pleasant mood;Lethargic;Requires cueing      Treatment Provided   Treatment provided Cognitive-Linquistic      Cognitive-Linquistic Treatment   Treatment focused on Cognition;Patient/family/caregiver education    Skilled Treatment Pt recalled to bring HEP from last ST session with this SLP. Pt did not add any television channels from last ST session with other therapist. Pt did not double check menu task as instructed. With intermittent min verbal prompting, pt found additional overt errors on tasks. With mod verbal prompt, pt recalled needing to use timer to turn off stove after cooking. Pt stated "I don't often leave the food," so SLP re-iterated timer is for turning off burner which may occur if dividing attention. SLP targeted problem solving and attention to detail tasks for deductive reasoning. Pt identified inaccurate item, in which SLP suggested re-reading clues to assist with performance. Mod A required to attend to and comprehend specific instructions to complete task accurately.      Assessment / Recommendations / Plan   Plan Continue with current plan of care      Progression Toward Goals   Progression toward goals Not progressing  toward goals (comment)   carryover of recs             SLP Education - 05/30/21 1046     Education Details HEP, functional recommendations    Person(s) Educated Patient    Methods Explanation;Handout    Comprehension Verbalized understanding;Need further instruction              SLP Short Term Goals - 05/30/21 1026       SLP SHORT TERM GOAL #1   Title Pt will use calendar to identify and manage appointments given occasional mod A over 3 sessions    Baseline 05-30-21    Time 1    Period Weeks    Status On-going   ongoing for all STGs as ST tx started 1 month after eval     SLP SHORT TERM GOAL #2   Title Pt will recall to take  medications with use of external aids given occasional min A over 3 sessions    Baseline 05-18-21, 05-25-21    Status Achieved      SLP SHORT TERM GOAL #3   Title Pt will problem solve possible or encountered household scenarios to optimize completion of daily tasks given occasional min A over 2 sessions    Time 1    Period Weeks    Status On-going      SLP SHORT TERM GOAL #4   Title Pt will initiate completion of 1-2 household tasks per day given occasional mod A from family over 2 sessions    Time 1    Period Weeks    Status On-going      SLP SHORT TERM GOAL #5   Title Pt/caregiver will complete cognitive PROM in first few ST sessions    Baseline PROM=83    Status Achieved              SLP Long Term Goals - 05/30/21 1027       SLP LONG TERM GOAL #1   Title Pt will use memory compensations for managment of appointments, medications, and daily tasks given occasional min A over 2 sessions    Time 5    Period Weeks    Status On-going    Target Date 06/10/21      SLP LONG TERM GOAL #2   Title Pt will verbalize and demonstrate appropriate problem solving to improve completion of household tasks given rare min A over 2 sessions    Time 5    Period Weeks    Status On-going    Target Date 06/10/21      SLP LONG TERM GOAL #3   Title Pt/caregiver will report less reliance on family to complete previously independent household tasks given occasional min A over 2 sessions    Time 5    Period Weeks    Status On-going    Target Date 06/10/21              Plan - 05/30/21 1026     Clinical Impression Statement Patrick Padilla continues to present with 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 Pt is reportedly using alarms to cue self to take medications, which has been effective for taking medications per patient. Ongoing education and training completed for strategies to improve his independence with household tasks, with limited carryover reported secondary to motivation.  Motivation and buy-in may be limiting factor to progress in ST. Continue skilled ST to maximize cognition for independence, safety, and reduce caregiver burden from daughter in law who has  an infant at home as well as caring for [place].    Speech Therapy Frequency 2x / week    Duration 8 weeks    Treatment/Interventions Compensatory strategies;Functional tasks;Patient/family education;Language facilitation;Compensatory techniques;Internal/external aids;SLP instruction and feedback;Cognitive reorganization;Environmental controls    Potential to [place]    Potential Considerations Cooperation/participation level;Previous level of function    Consulted and Agree with Plan of Care Patient             Patient will benefit from skilled therapeutic intervention in order to improve the following deficits and impairments:   Cognitive communication deficit    Problem List Patient Active Problem List   Diagnosis Date Noted   Dysphagia    Leucocytosis    Benign essential HTN    Acute gout of right knee    Acute ischemic right MCA stroke ([place]) 03/12/2021   Alcohol abuse    Seizure ([place])    Essential hypertension 12/26/2019   Longstanding persistent atrial fibrillation ([place])    Atrial fibrillation with RVR ([place]) 02/03/2019   Hyperlipidemia 02/03/2019   Anxiety 02/03/2019    Alinda Deem, CCC-SLP 05/30/2021, 11:03 AM  [facility] [address] [place], [place], [address] Phone: [phone]   Fax:  [phone]   Name: Patrick Padilla MRN: [id number] Date of Birth: 1955/09/15

## 2021-05-30 NOTE — Therapy (Signed)
[facility] [address] [place], [place], [address] Phone: [phone]   Fax:  [phone]  Physical Therapy Treatment  Patient Details  Name: Patrick Padilla MRN: [id number] Date of Birth: Sep 18, 1955 No data recorded  Encounter Date: 05/30/2021   PT End of Session - 05/30/21 1207     Visit Number 11    Number of Visits 13    Date for PT Re-Evaluation 06/01/21    Authorization Type MCR/MCD    PT Start Time 1110    PT Stop Time 1150    PT Time Calculation (min) 40 min    Activity Tolerance Patient tolerated treatment well             Past Medical History:  Diagnosis Date   Anxiety    Elevated LFTs    History of colon polyps    HLD (hyperlipidemia)    HTN (hypertension)    Localized superficial swelling, mass, or lump     Past Surgical History:  Procedure Laterality Date   CARDIOVERSION N/A 02/05/2019   Procedure: CARDIOVERSION;  Surgeon: Josue Hector, MD;  Location: [facility];  Service: Cardiovascular;  Laterality: N/A;   CERVICAL DISC SURGERY     IR CT HEAD LTD  03/12/2021   IR PERCUTANEOUS ART THROMBECTOMY/INFUSION INTRACRANIAL INC DIAG ANGIO  03/12/2021   IR US GUIDE VASC ACCESS RIGHT  03/15/2021   LEG SURGERY     right leg and ankle   NECK SURGERY     cyst removed   RADIOLOGY WITH ANESTHESIA N/A 03/12/2021   Procedure: RADIOLOGY WITH ANESTHESIA;  Surgeon: Radiologist, Medication, MD;  Location: [facility];  Service: Radiology;  Laterality: N/A;   TEE WITHOUT CARDIOVERSION N/A 02/05/2019   Procedure: TRANSESOPHAGEAL ECHOCARDIOGRAM (TEE);  Surgeon: Josue Hector, MD;  Location: [facility] ENDOSCOPY;  Service: Cardiovascular;  Laterality: N/A;   TOTAL HIP ARTHROPLASTY     left    There were no vitals filed for this visit.   Subjective Assessment - 05/30/21 1111     Subjective Pt reports stumbling at a store when picking up something on a low shelf and "kept going" someone helped him from falling to the ground.   LLE cramped at [facility] after walking awhile and he had to sit for awhile on a bag of dog food.    Pain Score 4     Pain Location Back    Pain Orientation Lower    Pain Descriptors / Indicators Tightness    Pain Type Chronic pain    Pain Onset More than a month ago    Pain Frequency Intermittent                               [facility] Adult PT Treatment/Exercise - 05/30/21 0001       Ambulation/Gait   Ambulation/Gait Yes    Ambulation/Gait Assistance 5: Supervision    Ambulation/Gait Assistance Details balance with dynamic gait practising head turns, changes in direction and gait speed.    Ambulation Distance (Feet) 200 Feet    Assistive device None    Gait Pattern Step-through pattern;Decreased arm swing - right;Decreased arm swing - left;Decreased step length - right;Decreased step length - left;Decreased dorsiflexion - right;Decreased dorsiflexion - left;Lateral hip instability    Ambulation Surface Level;Indoor      Exercises   Exercises Knee/Hip   seated hamstring stretches, multiple reps both LEs when pt's LLE felt like getting a cramp.  Balance Exercises - 05/30/21 0001       Balance Exercises: Standing   Standing, One Foot on a Step Eyes open   Progressing with upright gaze, head turns, (alt taps) intermittent UE support.   Tandem Gait Forward;4 reps    Sidestepping 4 reps    Marching Intermittent upper extremity assist;5 reps   forward high march; reported L hamstring starting to cramp, stopped and performed seated hamstring stretch.   Other Standing Exercises dynamic standing balance picking cones off ground and placing cones with multi level reaching and upper trunk rotations; no Ue support needed, supervision level.                PT Education - 05/30/21 1207     Education Details Added hamstring stretch to HEP.              PT Short Term Goals - 04/29/21 1104       PT SHORT TERM GOAL #1   Title Patient to  become Ind in initial [place]; 04/29/21 Compliant with HEP    Time 3    Period Weeks    Status Achieved    Target Date 05/11/21      PT SHORT TERM GOAL #2   Title Patient to negotiate 16 steps with most appropriate pattern and support    Baseline 8 steps with alternating pattern and B rails; 04/25/21 16 steps with single rail and step through pattern    Time 3    Period Weeks    Status Achieved    Target Date 05/11/21      PT SHORT TERM GOAL #3   Title Patient to transfer STS 1x w/o UE support    Baseline Requires BUE support to stand; 04/25/21 Able to stand once arms crossed followed by 4 more with CGA    Time 3    Period Weeks    Status Achieved    Target Date 05/11/21      PT SHORT TERM GOAL #4   Title Patient to ambulate 523f across outdoor surfaces w/o need of AD    Baseline 2069findoors w/o need of AD; 04/29/21 Ambulation of 100043fcross paved and grassy surfaces w/o need of AD    Time 3    Period Weeks    Status Achieved    Target Date 06/01/21               PT Long Term Goals - 05/20/21 1026       PT LONG TERM GOAL #1   Title Patient to decreased 5x STS time to <12s w/o UE assist    Baseline 15.4s with BUE assist; 05/10/21 13.0s    Time 6    Period Weeks    Status Partially Met      PT LONG TERM GOAL #2   Title Patient to increase score of FGA to 26/30    Baseline 20/30; 05/10/21 FGA score 25/30    Time 6    Period Weeks    Status On-going      PT LONG TERM GOAL #3   Title Patient to negotiate 16 steps with alternting pattern w/o UE support    Baseline 8 steps with alternating pattern and B UE support; 05/10/21 Able to negotiate 16 steps with single UE support    Time 6    Period Weeks    Status Partially Met      PT LONG TERM GOAL #4   Title Ambulate  1021f across outdoor surfaces w/o need of AD    Baseline 2067fin clinic w/o AD; 05/10/21 120013fcross sloped and grass surfaces w/o AD under S    Time 6    Period Weeks     Status On-going      PT LONG TERM GOAL #5   Title Increase FOTO score to 71    Baseline Initial score 55    Time 6    Period Weeks    Status On-going                   Plan - 05/30/21 1208     Clinical Impression Statement Pt able perform standing balance picking up objects off the floor without UE support at supervision level; challenging for pt.  Dynamic walking balance tasks with NBOS or SLS; pt requires intermittent UE support.    Personal Factors and Comorbidities Comorbidity 1    Comorbidities CVA    Examination-Activity Limitations Locomotion Level;Transfers    Stability/Clinical Decision Making Stable/Uncomplicated    Rehab Potential Good    PT Frequency 2x / week    PT Duration 6 weeks    PT Treatment/Interventions ADLs/Self Care Home Management;Aquatic Therapy;DME Instruction;Gait training;Stair training;Functional mobility training;Therapeutic activities;Balance training;Neuromuscular re-education;Therapeutic exercise;Patient/family education    PT Next Visit Plan 10th visit note, floor ladder, hurdles, gait with head turn/nods, dual tasking    PT Home Exercise Plan [email or web address]          Patient will benefit from skilled therapeutic intervention in order to improve the following deficits and impairments:  Abnormal gait, Difficulty walking, Decreased endurance, Decreased activity tolerance, Decreased balance, Decreased strength  Visit Diagnosis: Muscle weakness (generalized)  Unsteadiness on feet     Problem List Patient Active Problem List   Diagnosis Date Noted   Dysphagia    Leucocytosis    Benign essential HTN    Acute gout of right knee    Acute ischemic right MCA stroke ([place]CC[place]9/09/2020   Alcohol abuse    [place]izure ([place]  Essential hypertension 12/26/2019   Longstanding persistent atrial fibrillation ([place]  Atrial fibrillation with RVR ([place]CC[place]7/27/2020   Hyperlipidemia 02/03/2019   Anxiety 02/03/2019   KarBjorn LoserTA  05/30/21,  12:12 PM  [facility]one: [phone]:  [phone]: JoePalmer FahrnerN: [id number] of Birth: [birth year]

## 2021-05-31 ENCOUNTER — Ambulatory Visit (INDEPENDENT_AMBULATORY_CARE_PROVIDER_SITE_OTHER): Payer: Medicare Other | Admitting: Physician Assistant

## 2021-05-31 ENCOUNTER — Encounter: Payer: Self-pay | Admitting: Physician Assistant

## 2021-05-31 VITALS — BP 106/62 | HR 79 | Ht 72.0 in | Wt 203.0 lb

## 2021-05-31 DIAGNOSIS — Z7901 Long term (current) use of anticoagulants: Secondary | ICD-10-CM | POA: Diagnosis not present

## 2021-05-31 DIAGNOSIS — I10 Essential (primary) hypertension: Secondary | ICD-10-CM

## 2021-05-31 DIAGNOSIS — E7800 Pure hypercholesterolemia, unspecified: Secondary | ICD-10-CM

## 2021-05-31 DIAGNOSIS — I639 Cerebral infarction, unspecified: Secondary | ICD-10-CM

## 2021-05-31 DIAGNOSIS — I4811 Longstanding persistent atrial fibrillation: Secondary | ICD-10-CM | POA: Diagnosis not present

## 2021-05-31 DIAGNOSIS — F1010 Alcohol abuse, uncomplicated: Secondary | ICD-10-CM

## 2021-05-31 DIAGNOSIS — Z789 Other specified health status: Secondary | ICD-10-CM

## 2021-05-31 DIAGNOSIS — I4819 Other persistent atrial fibrillation: Secondary | ICD-10-CM | POA: Diagnosis not present

## 2021-05-31 DIAGNOSIS — F17200 Nicotine dependence, unspecified, uncomplicated: Secondary | ICD-10-CM

## 2021-05-31 NOTE — Patient Instructions (Addendum)
Medication Instructions:  STOP Amlodipine   *If you need a refill on your cardiac medications before your next appointment, please call your pharmacy*  Lab Work: NONE ordered at this time of appointment   If you have labs (blood work) drawn today and your tests are completely normal, you will receive your results only by: MyChart Message (if you have MyChart) OR A paper copy in the mail If you have any lab test that is abnormal or we need to change your treatment, we will call you to review the results.  Testing/Procedures: NONE ordered at this time of appointment   Follow-Up: At [facility], you and your health needs are our priority.  As part of our continuing mission to provide you with exceptional heart care, we have created designated Provider Care Teams.  These Care Teams include your primary Cardiologist (physician) and Advanced Practice Providers (APPs -  Physician Assistants and Nurse Practitioners) who all work together to provide you with the care you need, when you need it.  We recommend signing up for the patient portal called "MyChart".  Sign up information is provided on this After Visit Summary.  MyChart is used to connect with patients for Virtual Visits (Telemedicine).  Patients are able to view lab/test results, encounter notes, upcoming appointments, etc.  Non-urgent messages can be sent to your provider as well.   To learn more about what you can do with MyChart, go to [email or web address].    Your next appointment:   3 month(s)  The format for your next appointment:   In Person  Provider:   Thurmon Fair, MD    Other Instructions Monitor blood pressure at home daily at 2 PM.

## 2021-06-01 ENCOUNTER — Ambulatory Visit: Payer: Medicare Other

## 2021-06-01 ENCOUNTER — Ambulatory Visit: Payer: Medicare Other | Admitting: Occupational Therapy

## 2021-06-06 ENCOUNTER — Other Ambulatory Visit: Payer: Self-pay

## 2021-06-06 ENCOUNTER — Encounter: Payer: Self-pay | Admitting: Occupational Therapy

## 2021-06-06 ENCOUNTER — Ambulatory Visit: Payer: Medicare Other

## 2021-06-06 ENCOUNTER — Ambulatory Visit: Payer: Medicare Other | Admitting: Occupational Therapy

## 2021-06-06 DIAGNOSIS — M25512 Pain in left shoulder: Secondary | ICD-10-CM

## 2021-06-06 DIAGNOSIS — M25612 Stiffness of left shoulder, not elsewhere classified: Secondary | ICD-10-CM

## 2021-06-06 DIAGNOSIS — R2681 Unsteadiness on feet: Secondary | ICD-10-CM

## 2021-06-06 DIAGNOSIS — M6281 Muscle weakness (generalized): Secondary | ICD-10-CM

## 2021-06-06 DIAGNOSIS — R41841 Cognitive communication deficit: Secondary | ICD-10-CM

## 2021-06-06 DIAGNOSIS — R41840 Attention and concentration deficit: Secondary | ICD-10-CM | POA: Diagnosis not present

## 2021-06-06 DIAGNOSIS — R2689 Other abnormalities of gait and mobility: Secondary | ICD-10-CM

## 2021-06-06 NOTE — Therapy (Signed)
[facility] [address] [place], [place], [address] Phone: [phone]   Fax:  [phone]  Speech Language Pathology Treatment  Patient Details  Name: Patrick Padilla MRN: [id number] Date of Birth: 06/07/1956 Referring Provider (SLP): Angiulli, Daniel J, PA-C   Encounter Date: 06/06/2021   End of Session - 06/06/21 1007     Visit Number 8    Number of Visits 17    Date for SLP Re-Evaluation 06/10/21    SLP Start Time 1015    SLP Stop Time  1100    SLP Time Calculation (min) 45 min    Activity Tolerance Patient tolerated treatment well             Past Medical History:  Diagnosis Date   Anxiety    Elevated LFTs    History of colon polyps    HLD (hyperlipidemia)    HTN (hypertension)    Localized superficial swelling, mass, or lump     Past Surgical History:  Procedure Laterality Date   CARDIOVERSION N/A 02/05/2019   Procedure: CARDIOVERSION;  Surgeon: Nishan, Peter C, MD;  Location: [facility] ENDOSCOPY;  Service: Cardiovascular;  Laterality: N/A;   CERVICAL DISC SURGERY     IR CT HEAD LTD  03/12/2021   IR PERCUTANEOUS ART THROMBECTOMY/INFUSION INTRACRANIAL INC DIAG ANGIO  03/12/2021   IR US GUIDE VASC ACCESS RIGHT  03/15/2021   LEG SURGERY     right leg and ankle   NECK SURGERY     cyst removed   RADIOLOGY WITH ANESTHESIA N/A 03/12/2021   Procedure: RADIOLOGY WITH ANESTHESIA;  Surgeon: Radiologist, Medication, MD;  Location: [facility];  Service: Radiology;  Laterality: N/A;   TEE WITHOUT CARDIOVERSION N/A 02/05/2019   Procedure: TRANSESOPHAGEAL ECHOCARDIOGRAM (TEE);  Surgeon: Nishan, Peter C, MD;  Location: [facility] ENDOSCOPY;  Service: Cardiovascular;  Laterality: N/A;   TOTAL HIP ARTHROPLASTY     left    There were no vitals filed for this visit.   Subjective Assessment - 06/06/21 1014     Subjective "I got mad. I said have at it" re: preparation of Thanksgiving meal    Currently in Pain? No/denies                    ADULT SLP TREATMENT - 06/06/21 1007       General Information   Behavior/Cognition Alert;Cooperative;Pleasant mood;Lethargic;Requires cueing;Decreased sustained attention      Treatment Provided   Treatment provided Cognitive-Linquistic      Cognitive-Linquistic Treatment   Treatment focused on Cognition;Patient/family/caregiver education    Skilled Treatment Pt stated "I forgot my homework" and "I didn't get it all done." Pt stated "I've been remote surfacing." Pt indicated he is doing laundry and trying to cook. Pt aware of recent medication changes from cardiologist but unable to recall specifics. Pt endorsed need to write them down in phone. For next ST session, SLP instructed for patient to write medications in phone as recall aid. Despite pt electing to utilize phone versus other external aids, mod A required to navigate phone and locate apps to aid management of medications. When cued to locate medication that was possibly dishcharged within pharmacy app, pt stated "don't make me do that." Pt's family is still providing medications in bowl but pt is using alarms to manage them. Pt is using calendar to manage appointments. Limited motivation and pain reportedly continue per patient. SLP conducted sequencing task for manging schedule, in which pt required mild extra processing time.        Assessment / Recommendations / Plan   Plan Continue with current plan of care      Progression Toward Goals   Progression toward goals Not progressing toward goals (comment)   motivation, buy in             SLP Education - 06/06/21 1042     Education Details write down medications in phone    Person(s) Educated Patient    Methods Explanation;Demonstration;Handout;Verbal cues    Comprehension Verbalized understanding;Returned demonstration;Need further instruction              SLP Short Term Goals - 06/06/21 1008       SLP SHORT TERM GOAL #1   Title Pt will use calendar  to identify and manage appointments given occasional mod A over 3 sessions    Baseline 05-30-21    Status Partially Met      SLP SHORT TERM GOAL #2   Title Pt will recall to take medications with use of external aids given occasional min A over 3 sessions    Baseline 05-18-21, 05-25-21    Status Achieved      SLP SHORT TERM GOAL #3   Title Pt will problem solve possible or encountered household scenarios to optimize completion of daily tasks given occasional min A over 2 sessions    Baseline 06-06-21    Status Partially Met      SLP SHORT TERM GOAL #4   Title Pt will initiate completion of 1-2 household tasks per day given occasional mod A from family over 2 sessions    Status Partially Met      SLP SHORT TERM GOAL #5   Title Pt/caregiver will complete cognitive PROM in first few ST sessions    Baseline PROM=83    Status Achieved              SLP Long Term Goals - 06/06/21 1008       SLP LONG TERM GOAL #1   Title Pt will use memory compensations for managment of appointments, medications, and daily tasks given occasional min A over 2 sessions    Baseline 06-06-21    Time 4    Period Weeks    Status On-going    Target Date 06/10/21      SLP LONG TERM GOAL #2   Title Pt will verbalize and demonstrate appropriate problem solving to improve completion of household tasks given rare min A over 2 sessions    Time 4    Period Weeks    Status On-going    Target Date 06/10/21      SLP LONG TERM GOAL #3   Title Pt/caregiver will report less reliance on family to complete previously independent household tasks given occasional min A over 2 sessions    Time 4    Period Weeks    Status On-going    Target Date 06/10/21              Plan - 06/06/21 1007     Clinical Impression Statement Patrick Padilla continues to present with 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 Ongoing education and training completed for strategies to improve his independence with household tasks.  Mod A required this session to aid navigation and usage of phone as external memory aid. Self-reported reduced otivation and overall buy-in may be limiting factor to progress in ST. Continue skilled ST to maximize cognition for independence, safety, and reduce caregiver burden from daughter in law who has an infant at home as  well as caring for Patrick Padilla.    Speech Therapy Frequency 2x / week    Duration 8 weeks    Treatment/Interventions Compensatory strategies;Functional tasks;Patient/family education;Language facilitation;Compensatory techniques;Internal/external aids;SLP instruction and feedback;Cognitive reorganization;Environmental controls    Potential to Achieve Goals Fair    Potential Considerations Cooperation/participation level;Previous level of function    Consulted and Agree with Plan of Care Patient             Patient will benefit from skilled therapeutic intervention in order to improve the following deficits and impairments:   Cognitive communication deficit    Problem List Patient Active Problem List   Diagnosis Date Noted   Dysphagia    Leucocytosis    Benign essential HTN    Acute gout of right knee    Acute ischemic right MCA stroke (HCC) 03/12/2021   Alcohol abuse    Seizure (HCC)    Essential hypertension 12/26/2019   Longstanding persistent atrial fibrillation (HCC)    Atrial fibrillation with RVR (HCC) 02/03/2019   Hyperlipidemia 02/03/2019   Anxiety 02/03/2019    Katherine V Ingram, CCC-SLP 06/06/2021, 12:35 PM  [facility] [address] [place], [place], [address] Phone: [phone]   Fax:  [phone]   Name: Patrick Padilla MRN: [id number] Date of Birth: 12/21/1955  

## 2021-06-06 NOTE — Therapy (Signed)
[facility] [address] [place], [place], [address] Phone: [phone]   Fax:  [phone]  Occupational Therapy Treatment  Patient Details  Name: Patrick Padilla MRN: [id number] Date of Birth: 01-11-1956 Referring Provider (OT): Angiulli   Encounter Date: 06/06/2021   OT End of Session - 06/06/21 1146     Visit Number 11    Number of Visits 17    Date for OT Re-Evaluation 06/27/21    Authorization Type Medicare A&B, 20% coinsurance    Authorization - Visit Number 10    Progress Note Due on Visit 20    OT Start Time 1146    OT Stop Time 1224    OT Time Calculation (min) 38 min    Activity Tolerance Patient tolerated treatment well    Behavior During Therapy WFL for tasks assessed/performed             Past Medical History:  Diagnosis Date   Anxiety    Elevated LFTs    History of colon polyps    HLD (hyperlipidemia)    HTN (hypertension)    Localized superficial swelling, mass, or lump     Past Surgical History:  Procedure Laterality Date   CARDIOVERSION N/A 02/05/2019   Procedure: CARDIOVERSION;  Surgeon: Wendall Stade, MD;  Location: [facility] ENDOS[facility];  Service: Cardiovascular;  Laterality: N/A;   CERVICAL DISC SURGERY     IR CT HEAD LTD  03/12/2021   IR PERCUTANEOUS ART THROMBECTOMY/INFUSION INTRACRANIAL INC DIAG ANGIO  03/12/2021   IR US GUIDE VASC ACCESS RIGHT  03/15/2021   LEG SURGERY     right leg and ankle   NECK SURGERY     cyst removed   RADIOLOGY WITH ANESTHESIA N/A 03/12/2021   Procedure: RADIOLOGY WITH ANESTHESIA;  Surgeon: Radiologist, Medication, MD;  Location: [facility] OR;  Service: Radiology;  Laterality: N/A;   TEE WITHOUT CARDIOVERSION N/A 02/05/2019   Procedure: TRANSESOPHAGEAL ECHOCARDIOGRAM (TEE);  Surgeon: Wendall Stade, MD;  Location: [facility] ENDOSCOPY;  Service: Cardiovascular;  Laterality: N/A;   TOTAL HIP ARTHROPLASTY     left    There were no vitals filed for this visit.   Subjective  Assessment - 06/06/21 1146     Subjective  "my thanksgiving was good"    Pertinent History HTN, Afib, Anxiety, Multiple ortho injuries    Currently in Pain? No/denies    Pain Score 0-No pain                          OT Treatments/Exercises (OP) - 06/06/21 1155       Exercises   Exercises Hand      Hand Exercises   Other Hand Exercises resistance clothespins 1-8# for increased sustained grasp while placing playing cards in clothespins at same time for increasing sustained grasp with LUE for grip strengthening    Other Hand Exercises grip strength with medball x 5.5 lb x 10 reps on tabletop for increased grip strength with LUE      Fine Motor Coordination (Hand/Wrist)   Fine Motor Coordination Nuts and Bolts    Nuts and Bolts worked with nuts an bolts, hammer and nail and wrenches and pliers for increasing skills with dealing and manipulating hand tools for getting back to working in shop                      OT Short Term Goals - 05/18/21 1152  OT SHORT TERM GOAL #1   Title Patient will complete an HEP designed to address left shoulder pain and range of motion    Time 4    Period Weeks    Status Achieved      OT SHORT TERM GOAL #2   Title Patient will demonstrate improved safety and confidence (patient report) with transferring into and out of shower.    Time 4    Period Weeks    Status Achieved      OT SHORT TERM GOAL #3   Title Patient will demonstrate improved performance - decreased time, decreased pain in donning/doffing socks    Time 4    Period Weeks    Status Achieved   pt reports this is much better 05/13/21     OT SHORT TERM GOAL #4   Title Patient will prepare simple warm meal for himself with no more than supervision    Time 4    Period Weeks    Status Achieved               OT Long Term Goals - 05/30/21 1155       OT LONG TERM GOAL #1   Title Patient will complete updated HEP designed to increase range of  motion, strength and functional use of non-dominant LUE    Time 8    Period Weeks    Status On-going      OT LONG TERM GOAL #2   Title Patient will return to mowing 50% of his lawn with minimal assistance    Time 8    Period Weeks    Status On-going      OT LONG TERM GOAL #3   Title Patient will return to working with hand (versus power)  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    Time 8    Period Weeks    Status On-going   worked with hammer, nail and pliers to get nail out with no difficulty 05/30/21     OT LONG TERM GOAL #4   Title Patient will report decrease in pain with reaching patterns with LUE through mid to high reach with pain no more than 1-2/10.    Time 8    Period Weeks    Status On-going   2-3 pain in LUE shoulder during reaching patterns     OT LONG TERM GOAL #5   Title Patient will demonstrate 5 lb increase in left grip strength    Baseline 78.2 lbs at eval    Time 8    Period Weeks    Status On-going   70.7 lbs with LUE 05/30/21     OT LONG TERM GOAL #6   Title Patient will complete FOTO survey on discharge with at least 10 point improvement (55 UE on eval)    Time 8    Period Weeks    Status On-going                   Plan - 06/06/21 1225     Clinical Impression Statement Continues to make progress with LUE reach and tolerating without pain.    OT Occupational Profile and History Detailed Assessment- Review of Records and additional review of physical, cognitive, psychosocial history related to current functional performance    Occupational performance deficits (Please refer to evaluation for details): ADL's;IADL's;Leisure    Body Structure / Function / Physical Skills ADL;Decreased knowledge of use of DME;Strength;Balance;Dexterity;GMC;Pain;Tone;Body mechanics;Hearing;UE functional use;Cardiopulmonary status limiting activity;Endurance;IADL;ROM;Vestibular;Coordination;Flexibility;Mobility;Sensation;FMC;Decreased knowledge  of  precautions    Rehab Potential Good    Clinical Decision Making Several treatment options, min-mod task modification necessary    Comorbidities Affecting Occupational Performance: May have comorbidities impacting occupational performance    Modification or Assistance to Complete Evaluation  Min-Moderate modification of tasks or assist with assess necessary to complete eval    OT Frequency 2x / week    OT Duration 8 weeks    OT Treatment/Interventions Self-care/ADL training;Moist Heat;Fluidtherapy;DME and/or AE instruction;Balance training;Aquatic Therapy;Therapeutic activities;Ultrasound;Therapeutic exercise;Cognitive remediation/compensation;Neuromuscular education;Functional Mobility Training;Passive range of motion;Visual/perceptual remediation/compensation;Electrical Stimulation;Manual Therapy;Patient/family education    Plan NMR LUE - closed to open chain, mid to high reach active assisted - in pain free ranges    Consulted and Agree with Plan of Care Patient             Patient will benefit from skilled therapeutic intervention in order to improve the following deficits and impairments:   Body Structure / Function / Physical Skills: ADL, Decreased knowledge of use of DME, Strength, Balance, Dexterity, GMC, Pain, Tone, Body mechanics, Hearing, UE functional use, Cardiopulmonary status limiting activity, Endurance, IADL, ROM, Vestibular, Coordination, Flexibility, Mobility, Sensation, FMC, Decreased knowledge of precautions       Visit Diagnosis: Muscle weakness (generalized)  Unsteadiness on feet  Acute pain of left shoulder  Stiffness of left shoulder, not elsewhere classified  Attention and concentration deficit    Problem List Patient Active Problem List   Diagnosis Date Noted   Dysphagia    Leucocytosis    Benign essential HTN    Acute gout of right knee    Acute ischemic right MCA stroke (HCC) 03/12/2021   Alcohol abuse    Seizure (HCC)    Essential  hypertension 12/26/2019   Longstanding persistent atrial fibrillation (HCC)    Atrial fibrillation with RVR (HCC) 02/03/2019   Hyperlipidemia 02/03/2019   Anxiety 02/03/2019    Junious Dresser, OT/L 06/06/2021, 12:35 PM  [facility] [address] [place], [place], [address] Phone: [phone]   Fax:  [phone]  Name: Patrick Padilla MRN: [id number] Date of Birth: 1955/09/08

## 2021-06-06 NOTE — Therapy (Signed)
[facility] [address] [place], [place], [address] Phone: [phone]   Fax:  [phone]  Physical Therapy Treatment  Patient Details  Name: Patrick Padilla MRN: [id number] Date of Birth: 1955-12-11 No data recorded  Encounter Date: 06/06/2021   PT End of Session - 06/06/21 1107     Visit Number 12    Number of Visits 13    Date for PT Re-Evaluation 06/01/21    Authorization Type MCR/MCD    PT Start Time 1100    PT Stop Time 1145    PT Time Calculation (min) 45 min    Equipment Utilized During Treatment Gait belt    Activity Tolerance Patient tolerated treatment well    Behavior During Therapy WFL for tasks assessed/performed             Past Medical History:  Diagnosis Date   Anxiety    Elevated LFTs    History of colon polyps    HLD (hyperlipidemia)    HTN (hypertension)    Localized superficial swelling, mass, or lump     Past Surgical History:  Procedure Laterality Date   CARDIOVERSION N/A 02/05/2019   Procedure: CARDIOVERSION;  Surgeon: Josue Hector, MD;  Location: [place];  Service: Cardiovascular;  Laterality: N/A;   CERVICAL DISC SURGERY     IR CT HEAD LTD  03/12/2021   IR PERCUTANEOUS ART THROMBECTOMY/INFUSION INTRACRANIAL INC DIAG ANGIO  03/12/2021   IR US GUIDE VASC ACCESS RIGHT  03/15/2021   LEG SURGERY     right leg and ankle   NECK SURGERY     cyst removed   RADIOLOGY WITH ANESTHESIA N/A 03/12/2021   Procedure: RADIOLOGY WITH ANESTHESIA;  Surgeon: Radiologist, Medication, MD;  Location: [place];  Service: Radiology;  Laterality: N/A;   TEE WITHOUT CARDIOVERSION N/A 02/05/2019   Procedure: TRANSESOPHAGEAL ECHOCARDIOGRAM (TEE);  Surgeon: Josue Hector, MD;  Location: [facility] ENDOSCOPY;  Service: Cardiovascular;  Laterality: N/A;   TOTAL HIP ARTHROPLASTY     left    There were no vitals filed for this visit.   Subjective Assessment - 06/06/21 1105     Subjective Saw cardiologist last  week and had his BP medication dosage changed(micardis)    Patient is accompained by: Family member    Pertinent History MRI+acute infarct at the right basal ganglia. Few and  punctate bifrontal peripheral infarcts. Recent medial left thalamic infarct which may be subacute. 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    Limitations Standing;Walking    How long can you sit comfortably? >15 min    How long can you stand comfortably? <15 min    How long can you walk comfortably? <15 min    Patient Stated Goals To become more steady on his feet and return to outside ambulation    Pain Onset More than a month ago                               [facility] Adult PT Treatment/Exercise - 06/06/21 0001       Ambulation/Gait   Ambulation/Gait Yes    Ambulation/Gait Assistance 5: Supervision    Assistive device None    Gait Pattern Step-through pattern;Decreased arm swing - right;Decreased arm swing - left;Decreased step length - right;Decreased step length - left;Decreased dorsiflexion - right;Decreased dorsiflexion - left;Lateral hip instability    Ambulation Surface Level;Indoor    Gait Comments see treadmill  data      Knee/Hip Exercises: Aerobic   Tread Mill 10 min, 1.0 MPH, 0%                 Balance Exercises - 06/06/21 0001       Balance Exercises: Standing   Step Ups Forward;Intermittent UE support;Limitations    Step Ups Limitations onto low profile RB 10x per side w/o UE support    Step Over Hurdles / Cones 4 trips in // bars, short hurdles, initially with BUE support weaning to no UE support    Other Standing Exercises floor ladder 2 trips forward/backward, 2 trips sidestep, no UE support, 2 feet per square.                  PT Short Term Goals - 04/29/21 1104       PT SHORT TERM GOAL #1   Title Patient to become Ind in [place]; 04/29/21 Compliant with HEP    Time 3     Period Weeks    Status Achieved    Target Date 05/11/21      PT SHORT TERM GOAL #2   Title Patient to negotiate 16 steps with most appropriate pattern and support    Baseline 8 steps with alternating pattern and B rails; 04/25/21 16 steps with single rail and step through pattern    Time 3    Period Weeks    Status Achieved    Target Date 05/11/21      PT SHORT TERM GOAL #3   Title Patient to transfer STS 1x w/o UE support    Baseline Requires BUE support to stand; 04/25/21 Able to stand once arms crossed followed by 4 more with CGA    Time 3    Period Weeks    Status Achieved    Target Date 05/11/21      PT SHORT TERM GOAL #4   Title Patient to ambulate 567f across outdoor surfaces w/o need of AD    Baseline 2039findoors w/o need of AD; 04/29/21 Ambulation of 100015fcross paved and grassy surfaces w/o need of AD    Time 3    Period Weeks    Status Achieved    Target Date 06/01/21               PT Long Term Goals - 05/20/21 1026       PT LONG TERM GOAL #1   Title Patient to decreased 5x STS time to <12s w/o UE assist    Baseline 15.4s with BUE assist; 05/10/21 13.0s    Time 6    Period Weeks    Status Partially Met      PT LONG TERM GOAL #2   Title Patient to increase score of FGA to 26/30    Baseline 20/30; 05/10/21 FGA score 25/30    Time 6    Period Weeks    Status On-going      PT LONG TERM GOAL #3   Title Patient to negotiate 16 steps with alternting pattern w/o UE support    Baseline 8 steps with alternating pattern and B UE support; 05/10/21 Able to negotiate 16 steps with single UE support    Time 6    Period Weeks    Status Partially Met      PT LONG TERM GOAL #4   Title Ambulate [id number] outdoor surfaces w/o need of AD    Baseline 200ft46fclinic  w/o AD; 05/10/21 1218f across sloped and grass surfaces w/o AD under S    Time 6    Period Weeks    Status On-going      PT LONG TERM GOAL #5   Title Increase FOTO score to 71    Baseline  Initial score 55    Time 6    Period Weeks    Status On-going                   Plan - 06/06/21 1153     Clinical Impression Statement Todays session introduced treadmill walking with patient relying heavily on his UEs for support despite ability to [facility]ulate [facility], VCs to increase step length.  Added hurdles and floor ladder to facilitate stepping with accurate placement of feet with difficulty placing R foot properly.  Placement issues noted with A/P directions but not sidestepping.  STGs met at this time with patient progressing towards LTGs    Personal Factors and Comorbidities Comorbidity 1    Comorbidities CVA    Examination-Activity Limitations Locomotion Level;Transfers    Stability/Clinical Decision Making Stable/Uncomplicated    Rehab Potential Good    PT Frequency 1x / week    PT Duration 4 weeks    PT Treatment/Interventions ADLs/Self Care Home Management;Aquatic Therapy;DME Instruction;Gait training;Stair training;Functional mobility training;Therapeutic activities;Balance training;Neuromuscular re-education;Therapeutic exercise;Patient/family education    PT Next Visit Plan stepping tasks for accuracy, continue treadmill, assess LTGs    [facility]            Patient will benefit from skilled therapeutic intervention in order to improve the following deficits and impairments:  Abnormal gait, Difficulty walking, Decreased endurance, Decreased activity tolerance, Decreased balance, Decreased strength  Visit Diagnosis: Unsteadiness on feet  Balance disorder  Muscle weakness (generalized)     Problem List Patient Active Problem List   Diagnosis Date Noted   Dysphagia    Leucocytosis    Benign essential HTN    Acute gout of right knee    Acute ischemic right MCA stroke ([place] 03/12/2021   Alcohol abuse    Seizure ([place]    Essential hypertension 12/26/2019   Longstanding persistent atrial fibrillation ([facility]    Atrial  fibrillation with RVR ([place] 02/03/2019   Hyperlipidemia 02/03/2019   Anxiety 02/03/2019    JLanice Shirts PT 06/06/2021, 12:12 PM  [id number] [address]Phone: [phone]  Fax:  [phone] Name: Patrick GuillotteMRN: [id number] of Birth: 7Oct 07, 1957

## 2021-06-06 NOTE — Patient Instructions (Addendum)
Write down your current medications in your phone for next Speech Therapy session -Locate "Notes" app in your phone -Write down the name of the med, dosage, and when you take them

## 2021-06-08 ENCOUNTER — Ambulatory Visit: Payer: Medicare Other | Admitting: Occupational Therapy

## 2021-06-08 ENCOUNTER — Other Ambulatory Visit: Payer: Self-pay

## 2021-06-08 ENCOUNTER — Ambulatory Visit: Payer: Medicare Other

## 2021-06-08 DIAGNOSIS — M6281 Muscle weakness (generalized): Secondary | ICD-10-CM

## 2021-06-08 DIAGNOSIS — R2681 Unsteadiness on feet: Secondary | ICD-10-CM

## 2021-06-08 DIAGNOSIS — M25612 Stiffness of left shoulder, not elsewhere classified: Secondary | ICD-10-CM

## 2021-06-08 DIAGNOSIS — R2689 Other abnormalities of gait and mobility: Secondary | ICD-10-CM

## 2021-06-08 DIAGNOSIS — R41840 Attention and concentration deficit: Secondary | ICD-10-CM | POA: Diagnosis not present

## 2021-06-08 DIAGNOSIS — M25512 Pain in left shoulder: Secondary | ICD-10-CM | POA: Diagnosis not present

## 2021-06-08 DIAGNOSIS — R41841 Cognitive communication deficit: Secondary | ICD-10-CM

## 2021-06-08 NOTE — Patient Instructions (Signed)
Get a notepad to write down information so that everything is in one location and easy to find (but place out of reach from [place])

## 2021-06-08 NOTE — Therapy (Addendum)
[facility] [address] [place], [place], [address] Phone: [phone]   Fax:  [phone]  Speech Language Pathology Treatment/Discharge  Patient Details  Name: Patrick Padilla MRN: [id number] Date of Birth: 10-May-1956 Referring Provider (SLP): Cathlyn Parsons, PA-C   Encounter Date: 06/08/2021   End of Session - 06/08/21 1014     Visit Number 9    Number of Visits 17    Date for SLP Re-Evaluation 06/10/21    SLP Start Time 2500    SLP Stop Time  1045    SLP Time Calculation (min) 30 min    Activity Tolerance Patient tolerated treatment well             Past Medical History:  Diagnosis Date   Anxiety    Elevated LFTs    History of colon polyps    HLD (hyperlipidemia)    HTN (hypertension)    Localized superficial swelling, mass, or lump     Past Surgical History:  Procedure Laterality Date   CARDIOVERSION N/A 02/05/2019   Procedure: CARDIOVERSION;  Surgeon: Josue Hector, MD;  Location: [facility];  Service: Cardiovascular;  Laterality: N/A;   CERVICAL DISC SURGERY     IR CT HEAD LTD  03/12/2021   IR PERCUTANEOUS ART THROMBECTOMY/INFUSION INTRACRANIAL INC DIAG ANGIO  03/12/2021   IR US GUIDE VASC ACCESS RIGHT  03/15/2021   LEG SURGERY     right leg and ankle   NECK SURGERY     cyst removed   RADIOLOGY WITH ANESTHESIA N/A 03/12/2021   Procedure: RADIOLOGY WITH ANESTHESIA;  Surgeon: Radiologist, Medication, MD;  Location: [facility];  Service: Radiology;  Laterality: N/A;   TEE WITHOUT CARDIOVERSION N/A 02/05/2019   Procedure: TRANSESOPHAGEAL ECHOCARDIOGRAM (TEE);  Surgeon: Josue Hector, MD;  Location: [facility] ENDOSCOPY;  Service: Cardiovascular;  Laterality: N/A;   TOTAL HIP ARTHROPLASTY     left    There were no vitals filed for this visit.   Subjective Assessment - 06/08/21 1015     Subjective "I did my homework. You should be proud of me"    Currently in Pain? Yes    Pain Score 2     Pain Location  Shoulder   and ankle           SPEECH THERAPY DISCHARGE SUMMARY  Visits from Start of Care: 9  Current functional level related to goals / functional outcomes: Patrick Padilla exhibited some progress towards ST goals targeting cognitive linguistic skills. Improved consistency noted for carryover and implementation of SLP recommendations and strategies. Some increased functional independence with medication management reported by patient (family did not attend to confirm). Ongoing reduced motivation after stroke reported and SLP recommended patient follow up with MD re: recommendations. Pt reported he has essentially returned to baseline. Pt is pleased with current progress and agreeable to ST discharge on last scheduled visit.    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    Education / Equipment: Memory and attention compensations, functional cognitive tasks, HEP    Patient agrees to discharge. Patient goals were partially met. Patient is being discharged due to maximized rehab potential. .       ADULT SLP TREATMENT - 06/08/21 1014       General Information   Behavior/Cognition Alert;Cooperative;Pleasant mood;Decreased sustained attention      Treatment Provided   Treatment provided Cognitive-Linquistic      Cognitive-Linquistic Treatment   Treatment focused on Cognition;Patient/family/caregiver education  Skilled Treatment Pt stated "I did my homework. You should be proud." Pt wrote down list of medications based on paperwork given at recent cardiologist appointment. Per MD note, pt stopped taking one medication which was written on his list as taking. SLP informed patient, which he removed from is list. Pt also had two of the same medications, different names and dosages written down. SLP suggested pt request further clarification with MD re: current medication list. SLP cued patient to write down questions/concerns in phone for upcoming  neurologist appointment. Change in motivation continues to be pt's primary concern. Pt reports memory, attention, and problem solving have essentially returned to baseline. Improved insight into safety concerns reported.      Assessment / Recommendations / Plan   Plan Discharge SLP treatment due to pt pleased with current progress     Progression Toward Goals   Progression toward goals Goals partially met, education completed, patient discharged from [facility] - 06/08/21 1039     Education Details discharge summary, recommendations (notepad by phone)    Person(s) Educated Patient    Methods Explanation;Demonstration    Comprehension Verbalized understanding;Returned demonstration              SLP Short Term Goals - 06/06/21 1008       SLP SHORT TERM GOAL #1   Title Pt will use calendar to identify and manage appointments given occasional mod A over 3 sessions    Baseline 05-30-21    Status Partially Met      SLP SHORT TERM GOAL #2   Title Pt will recall to take medications with use of external aids given occasional min A over 3 sessions    Baseline 05-18-21, 05-25-21    Status Achieved      SLP SHORT TERM GOAL #3   Title Pt will problem solve possible or encountered household scenarios to optimize completion of daily tasks given occasional min A over 2 sessions    Baseline 06-06-21    Status Partially Met      SLP SHORT TERM GOAL #4   Title Pt will initiate completion of 1-2 household tasks per day given occasional mod A from family over 2 sessions    Status Partially Met      SLP SHORT TERM GOAL #5   Title Pt/caregiver will complete cognitive PROM in first few ST sessions    Baseline PROM=83    Status Achieved              SLP Long Term Goals - 06/08/21 1015       SLP LONG TERM GOAL #1   Title Pt will use memory compensations for managment of appointments, medications, and daily tasks given occasional min A over 2 sessions     Baseline 06-06-21    Time 4    Period Weeks    Status Partially Met      SLP LONG TERM GOAL #2   Title Pt will verbalize and demonstrate appropriate problem solving to improve completion of household tasks given rare min A over 2 sessions    Baseline 06-08-21    Time 4    Period Weeks    Status Partially Met      SLP LONG TERM GOAL #3   Title Pt/caregiver will report less reliance on family to complete previously independent household tasks given occasional min A over 2 sessions    Baseline 06-08-21  Time 4    Period Weeks    Status Partially Met              Plan - 06/08/21 1050     Clinical Impression Statement Patrick Padilla presented with some improvements for 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 Ongoing education and training completed for strategies to improve his independence with household tasks. Pt is pleased with current progress and reports he has nearly returned to baseline for cognition. SLP recommended pt f/u with MD re: reduced motivation following stroke.    Speech Therapy Frequency 2x / week    Duration 8 weeks    Treatment/Interventions Compensatory strategies;Functional tasks;Patient/family education;Language facilitation;Compensatory techniques;Internal/external aids;SLP instruction and feedback;Cognitive reorganization;Environmental controls    Potential to [place]    Potential Considerations Cooperation/participation level;Previous level of function    Consulted and Agree with Plan of Care Patient             Patient will benefit from skilled therapeutic intervention in order to improve the following deficits and impairments:   Cognitive communication deficit    Problem List Patient Active Problem List   Diagnosis Date Noted   Dysphagia    Leucocytosis    Benign essential HTN    Acute gout of right knee    Acute ischemic right MCA stroke ([facility]) 03/12/2021   Alcohol abuse    Seizure ([place])    Essential hypertension 12/26/2019    Longstanding persistent atrial fibrillation ([place])    Atrial fibrillation with RVR ([place]) 02/03/2019   Hyperlipidemia 02/03/2019   Anxiety 02/03/2019    Alinda Deem, CCC-SLP 06/08/2021, 10:51 AM  [facility] [address] [place], [place], [address] Phone: [phone]   Fax:  [phone]   Name: Patrick Padilla MRN: [id number] Date of Birth: May 22, 1956

## 2021-06-08 NOTE — Therapy (Signed)
[facility] [address] [place], [place], [address] Phone: [phone]   Fax:  [phone]  Physical Therapy Treatment  Patient Details  Name: Patrick Padilla MRN: [id number] Date of Birth: 04-Apr-1956 No data recorded  Encounter Date: 06/08/2021   PT End of Session - 06/08/21 1105     Visit Number 13    Number of Visits 17    Date for PT Re-Evaluation 06/01/21    Authorization Type MCR/MCD    Progress Note Due on Visit 17    PT Start Time 1100    PT Stop Time 1145    PT Time Calculation (min) 45 min    Equipment Utilized During Treatment Gait belt    Activity Tolerance Patient tolerated treatment well    Behavior During Therapy WFL for tasks assessed/performed             Past Medical History:  Diagnosis Date   Anxiety    Elevated LFTs    History of colon polyps    HLD (hyperlipidemia)    HTN (hypertension)    Localized superficial swelling, mass, or lump     Past Surgical History:  Procedure Laterality Date   CARDIOVERSION N/A 02/05/2019   Procedure: CARDIOVERSION;  Surgeon: Josue Hector, MD;  Location: [facility];  Service: Cardiovascular;  Laterality: N/A;   CERVICAL DISC SURGERY     IR CT HEAD LTD  03/12/2021   IR PERCUTANEOUS ART THROMBECTOMY/INFUSION INTRACRANIAL INC DIAG ANGIO  03/12/2021   IR US GUIDE VASC ACCESS RIGHT  03/15/2021   LEG SURGERY     right leg and ankle   NECK SURGERY     cyst removed   RADIOLOGY WITH ANESTHESIA N/A 03/12/2021   Procedure: RADIOLOGY WITH ANESTHESIA;  Surgeon: Radiologist, Medication, MD;  Location: [facility];  Service: Radiology;  Laterality: N/A;   TEE WITHOUT CARDIOVERSION N/A 02/05/2019   Procedure: TRANSESOPHAGEAL ECHOCARDIOGRAM (TEE);  Surgeon: Josue Hector, MD;  Location: [facility] ENDOSCOPY;  Service: Cardiovascular;  Laterality: N/A;   TOTAL HIP ARTHROPLASTY     left    There were no vitals filed for this visit.   Subjective Assessment - 06/08/21 1103      Subjective Reports no change in occasional dizziness following BP med change.  Has been DCed from [facility] today.    Patient is accompained by: Family member    Pertinent History MRI+acute infarct at the right basal ganglia. Few and  punctate bifrontal peripheral infarcts. Recent medial left thalamic infarct which may be subacute. 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    Limitations Standing;Walking    How long can you sit comfortably? >15 min    How long can you stand comfortably? <15 min    How long can you walk comfortably? <15 min    Patient Stated Goals To become more steady on his feet and return to outside ambulation    Pain Onset More than a month ago                [facility] PT Assessment - 06/08/21 0001       Functional Gait  Assessment   Gait assessed  Yes    Gait Level Surface Walks 20 ft, slow speed, abnormal gait pattern, evidence for imbalance or deviates 10-15 in outside of the 12 in walkway width. Requires more than 7 sec to ambulate 20 ft.    Change in Gait Speed Able to change speed, demonstrates mild gait deviations, deviates  6-10 in outside of the 12 in walkway width, or no gait deviations, unable to achieve a major change in velocity, or uses a change in velocity, or uses an assistive device.    Gait with Horizontal Head Turns Performs head turns smoothly with slight change in gait velocity (eg, minor disruption to smooth gait path), deviates 6-10 in outside 12 in walkway width, or uses an assistive device.    Gait with Vertical Head Turns Performs task with slight change in gait velocity (eg, minor disruption to smooth gait path), deviates 6 - 10 in outside 12 in walkway width or uses assistive device    Gait and Pivot Turn Pivot turns safely within 3 sec and stops quickly with no loss of balance.    Step Over Obstacle Is able to step over 2 stacked shoe boxes taped together (9 in total height) without changing gait  speed. No evidence of imbalance.    Gait with Narrow Base of Support Ambulates 4-7 steps.    Gait with Eyes Closed Walks 20 ft, slow speed, abnormal gait pattern, evidence for imbalance, deviates 10-15 in outside 12 in walkway width. Requires more than 9 sec to ambulate 20 ft.    Ambulating Backwards Walks 20 ft, uses assistive device, slower speed, mild gait deviations, deviates 6-10 in outside 12 in walkway width.    Steps Alternating feet, must use rail.    Total Score 19                           [facility] Adult PT Treatment/Exercise - 06/08/21 0001       Transfers   Transfers Sit to Stand;Stand to Sit    Sit to Stand 6: Modified independent (Device/Increase time)    Five time sit to stand comments  13.44s    Stand to Sit 6: Modified independent (Device/Increase time)      Ambulation/Gait   Ambulation/Gait Yes    Ambulation/Gait Assistance 5: Supervision    Assistive device None    Gait Pattern Step-through pattern;Decreased arm swing - right;Decreased arm swing - left;Decreased step length - right;Decreased step length - left;Decreased dorsiflexion - right;Decreased dorsiflexion - left;Lateral hip instability    Ambulation Surface Level;Indoor    Gait Comments see treadmill data      Knee/Hip Exercises: Aerobic   Tread Mill 10 min, 1.0 MPH, 0% grade                 Balance Exercises - 06/08/21 0001       Balance Exercises: Standing   Other Standing Exercises floor ladder 4 trips forward, 4 trips sidestep, no UE support, 2 feet per square.                  PT Short Term Goals - 04/29/21 1104       PT SHORT TERM GOAL #1   Title Patient to become Ind in initial [place]; 04/29/21 Compliant with HEP    Time 3    Period Weeks    Status Achieved    Target Date 05/11/21      PT SHORT TERM GOAL #2   Title Patient to negotiate 16 steps with most appropriate pattern and support    Baseline 8 steps with alternating pattern and B  rails; 04/25/21 16 steps with single rail and step through pattern    Time 3    Period Weeks    Status Achieved  Target Date 05/11/21      PT SHORT TERM GOAL #3   Title Patient to transfer STS 1x w/o UE support    Baseline Requires BUE support to stand; 04/25/21 Able to stand once arms crossed followed by 4 more with CGA    Time 3    Period Weeks    Status Achieved    Target Date 05/11/21      PT SHORT TERM GOAL #4   Title Patient to ambulate 557f across outdoor surfaces w/o need of AD    Baseline 2066findoors w/o need of AD; 04/29/21 Ambulation of 10005fcross paved and grassy surfaces w/o need of AD    Time 3    Period Weeks    Status Achieved    Target Date 06/01/21               PT Long Term Goals - 06/08/21 1106       PT LONG TERM GOAL #1   Title Patient to decreased 5x STS time to <12s w/o UE assist    Baseline 15.4s with BUE assist; 05/10/21 13.0s; 06/08/21 Score 13.4s    Time 4    Period Weeks    Status On-going    Target Date 07/13/21      PT LONG TERM GOAL #2   Title Patient to increase score of FGA to 26/30    Baseline 20/30; 05/10/21 FGA score 25/30; 06/08/21 FGA score decreased to 19/30    Time 4    Period Weeks    Status On-going    Target Date 07/13/21      PT LONG TERM GOAL #3   Title Patient to negotiate 16 steps with alternting pattern w/o UE support    Baseline 8 steps with alternating pattern and B UE support; 05/10/21 Able to negotiate 16 steps with single UE support    Time 4    Period Weeks    Status Partially Met    Target Date 07/13/21      PT LONG TERM GOAL #4   Title Ambulate 1000f49fross outdoor surfaces w/o need of AD    Baseline 200ft33fclinic w/o AD; 05/10/21 1200ft 63fss sloped and grass surfaces w/o AD under S    Time 4    Period Weeks    Status On-going    Target Date 07/13/21      PT LONG TERM GOAL #5   Title Increase FOTO score to 71    Baseline Initial score 55    Time 4    Period Weeks    Status On-going     Target Date 07/13/21                   Plan - 06/08/21 1125     Clinical Impression Statement Fluctuating performance on FGA with balance loss noted when head turns involved and BOS narrows suggesting inner ear/vestibular dysfunction.  Discussed recommendation of new shoes as current shoe soles are becoming detached.  Overall LE strength functional but chronic low back and cervical pain may be factoring into balance deficits.    Personal Factors and Comorbidities Comorbidity 1    Comorbidities CVA    Examination-Activity Limitations Locomotion Level;Transfers    Stability/Clinical Decision Making Stable/Uncomplicated    Rehab Potential Good    PT Frequency 1x / week    PT Duration 4 weeks    PT Treatment/Interventions ADLs/Self Care Home Management;Aquatic Therapy;DME Instruction;Gait training;Stair training;Functional mobility training;Therapeutic activities;Balance training;Neuromuscular re-education;Therapeutic exercise;Patient/family education  PT Next Visit Plan stepping tasks for accuracy, continue treadmill, assess LTGs, assess for vestibular issues, continue 1w4 to address any remaiing deficits    PT Home Exercise Plan [facility]             Patient will benefit from skilled therapeutic intervention in order to improve the following deficits and impairments:  Abnormal gait, Difficulty walking, Decreased endurance, Decreased activity tolerance, Decreased balance, Decreased strength  Visit Diagnosis: Unsteadiness on feet  Balance disorder  Muscle weakness (generalized)     Problem List Patient Active Problem List   Diagnosis Date Noted   Dysphagia    Leucocytosis    Benign essential HTN    Acute gout of right knee    Acute ischemic right MCA stroke ([place]) 03/12/2021   Alcohol abuse    Seizure ([place])    Essential hypertension 12/26/2019   Longstanding persistent atrial fibrillation ([place])    Atrial fibrillation with RVR ([place]) 02/03/2019   Hyperlipidemia  02/03/2019   Anxiety 02/03/2019    Lanice Shirts, PT 06/08/2021, 12:50 PM  [facility] [address] [place], [place], [address] Phone: [phone]   Fax:  [phone]  Name: Patrick Padilla MRN: [id number] Date of Birth: 05/19/1956

## 2021-06-08 NOTE — Therapy (Signed)
[facility] [address] [place], [place], [address] Phone: [phone]   Fax:  [phone]  Occupational Therapy Treatment  Patient Details  Name: Patrick Padilla MRN: [id number] Date of Birth: 1956/03/18 Referring Provider (OT): Angiulli   Encounter Date: 06/08/2021   OT End of Session - 06/08/21 1224     Visit Number 12    Number of Visits 17    Date for OT Re-Evaluation 06/27/21    Progress Note Due on Visit 20    OT Start Time 1145    OT Stop Time 1225    OT Time Calculation (min) 40 min    Activity Tolerance Patient tolerated treatment well    Behavior During Therapy WFL for tasks assessed/performed             Past Medical History:  Diagnosis Date   Anxiety    Elevated LFTs    History of colon polyps    HLD (hyperlipidemia)    HTN (hypertension)    Localized superficial swelling, mass, or lump     Past Surgical History:  Procedure Laterality Date   CARDIOVERSION N/A 02/05/2019   Procedure: CARDIOVERSION;  Surgeon: Wendall Stade, MD;  Location: [facility] ENDOSCOPY;  Service: Cardiovascular;  Laterality: N/A;   CERVICAL DISC SURGERY     IR CT HEAD LTD  03/12/2021   IR PERCUTANEOUS ART THROMBECTOMY/INFUSION INTRACRANIAL INC DIAG ANGIO  03/12/2021   IR US GUIDE VASC ACCESS RIGHT  03/15/2021   LEG SURGERY     right leg and ankle   NECK SURGERY     cyst removed   RADIOLOGY WITH ANESTHESIA N/A 03/12/2021   Procedure: RADIOLOGY WITH ANESTHESIA;  Surgeon: Radiologist, Medication, MD;  Location: [facility] OR;  Service: Radiology;  Laterality: N/A;   TEE WITHOUT CARDIOVERSION N/A 02/05/2019   Procedure: TRANSESOPHAGEAL ECHOCARDIOGRAM (TEE);  Surgeon: Wendall Stade, MD;  Location: [facility] ENDOSCOPY;  Service: Cardiovascular;  Laterality: N/A;   TOTAL HIP ARTHROPLASTY     left    There were no vitals filed for this visit.                 OT Treatments/Exercises (OP) - 06/08/21 1222       ADLs   Cooking  Patient has returned to cooking periodically. Able to use stove/oven effectively per his report.    Home Maintenance Patient has not been out to his workshop due to weather.    Driving Patient has returned to driving - graduated driving program - not at night or on interstate    ADL Comments Anticipate discharging on schedule aspatient progressing toward long term goals      Hand Exercises   Other Hand Exercises Red putty exercises - see patient instructions                    OT Education - 06/08/21 1218     Education Details Putty exercises for hand strength    Person(s) Educated Patient    Methods Explanation;Demonstration;Tactile cues;Verbal cues;Handout    Comprehension Verbalized understanding;Returned demonstration              OT Short Term Goals - 05/18/21 1152       OT SHORT TERM GOAL #1   Title Patient will complete an HEP designed to address left shoulder pain and range of motion    Time 4    Period Weeks    Status Achieved      OT SHORT TERM GOAL #2  Title Patient will demonstrate improved safety and confidence (patient report) with transferring into and out of shower.    Time 4    Period Weeks    Status Achieved      OT SHORT TERM GOAL #3   Title Patient will demonstrate improved performance - decreased time, decreased pain in donning/doffing socks    Time 4    Period Weeks    Status Achieved   pt reports this is much better 05/13/21     OT SHORT TERM GOAL #4   Title Patient will prepare simple warm meal for himself with no more than supervision    Time 4    Period Weeks    Status Achieved               OT Long Term Goals - 06/08/21 1203       OT LONG TERM GOAL #1   Title Patient will complete updated HEP designed to increase range of motion, strength and functional use of non-dominant LUE    Time 8    Period Weeks    Status On-going      OT LONG TERM GOAL #2   Title Patient will return to mowing 50% of his lawn with minimal  assistance    Time 8    Period Weeks    Status Unable to assess      OT LONG TERM GOAL #3   Title Patient will return to working with hand (versus power)  tools for household maintenance or work in Patent attorney.    Status On-going      OT LONG TERM GOAL #4   Title Patient will report decrease in pain with reaching patterns with LUE through mid to high reach with pain no more than 1-2/10.    Time 8    Period Weeks    Status On-going      OT LONG TERM GOAL #5   Title Patient will demonstrate 5 lb increase in left grip strength    Status On-going                   Plan - 06/08/21 1224     Clinical Impression Statement Continues to make progress withtoward LTG's - has returned to driving short trips    OT Occupational Profile and History Detailed Assessment- Review of Records and additional review of physical, cognitive, psychosocial history related to current functional performance    Occupational performance deficits (Please refer to evaluation for details): ADL's;IADL's;Leisure    Body Structure / Function / Physical Skills ADL;Decreased knowledge of use of DME;Strength;Balance;Dexterity;GMC;Pain;Tone;Body mechanics;Hearing;UE functional use;Cardiopulmonary status limiting activity;Endurance;IADL;ROM;Vestibular;Coordination;Flexibility;Mobility;Sensation;FMC;Decreased knowledge of precautions    Rehab Potential Good    Clinical Decision Making Several treatment options, min-mod task modification necessary    Comorbidities Affecting Occupational Performance: May have comorbidities impacting occupational performance    Modification or Assistance to Complete Evaluation  Min-Moderate modification of tasks or assist with assess necessary to complete eval    OT Frequency 2x / week    OT Duration 8 weeks    OT Treatment/Interventions Self-care/ADL training;Moist Heat;Fluidtherapy;DME and/or AE instruction;Balance training;Aquatic Therapy;Therapeutic activities;Ultrasound;Therapeutic  exercise;Cognitive remediation/compensation;Neuromuscular education;Functional Mobility Training;Passive range of motion;Visual/perceptual remediation/compensation;Electrical Stimulation;Manual Therapy;Patient/family education    Plan check putty exercises - NMR LUE - closed to open chain, mid to high reach active assisted - in pain free ranges    Consulted and Agree with Plan of Care Patient             Patient will  benefit from skilled therapeutic intervention in order to improve the following deficits and impairments:   Body Structure / Function / Physical Skills: ADL, Decreased knowledge of use of DME, Strength, Balance, Dexterity, GMC, Pain, Tone, Body mechanics, Hearing, UE functional use, Cardiopulmonary status limiting activity, Endurance, IADL, ROM, Vestibular, Coordination, Flexibility, Mobility, Sensation, FMC, Decreased knowledge of precautions       Visit Diagnosis: Acute pain of left shoulder  Stiffness of left shoulder, not elsewhere classified  Muscle weakness (generalized)  Unsteadiness on feet    Problem List Patient Active Problem List   Diagnosis Date Noted   Dysphagia    Leucocytosis    Benign essential HTN    Acute gout of right knee    Acute ischemic right MCA stroke ([place]) 03/12/2021   Alcohol abuse    Seizure ([place])    Essential hypertension 12/26/2019   Longstanding persistent atrial fibrillation (HCC)    Atrial fibrillation with RVR ([place]) 02/03/2019   Hyperlipidemia 02/03/2019   Anxiety 02/03/2019    Mariah Milling, OT/L 06/08/2021, 12:26 PM  [facility] [address] [place], [place], [address] Phone: [phone]   Fax:  [phone]  Name: Skipper Machin MRN: [id number] Date of Birth: Dec 07, 1955

## 2021-06-08 NOTE — Patient Instructions (Signed)
11. Grip Strengthening (Resistive Putty)   Squeeze putty using thumb and all fingers. Repeat 15 times. Do 1-2 sessions per day.   Extension (Assistive Putty)   Roll putty back and forth, being sure to use all fingertips. Repeat 3 times. Do 1-2 sessions per day.  Then pinch as below.     Palmar Pinch Strengthening (Resistive Putty)   Pinch putty between thumb and each fingertip in turn after rolling out    Finger and Thumb Extension (Resistive Putty)   With thumb and all fingers in center of putty donut, stretch out. Repeat 5-10 times. Do 1-2 sessions per day.         MP Flexion (Resistive Putty)   Bending only at large knuckles, press putty down against thumb. Keep fingertips straight. Repeat 10 times. Do 1-2 sessions per day.    Lateral Pinch Strengthening (Resistive Putty)    Squeeze between thumb and side of each finger in turn. Repeat 10 times. Do 1-2 sessions per day.   FINGERS: Extension (Putty)    Open hand and fingers to flatten putty. 5 reps per set, 1 sets per day, 5 days per week

## 2021-06-14 ENCOUNTER — Encounter: Payer: Self-pay | Admitting: Occupational Therapy

## 2021-06-14 ENCOUNTER — Other Ambulatory Visit: Payer: Self-pay

## 2021-06-14 ENCOUNTER — Ambulatory Visit: Payer: Medicare Other | Attending: Physician Assistant | Admitting: Occupational Therapy

## 2021-06-14 DIAGNOSIS — M25512 Pain in left shoulder: Secondary | ICD-10-CM | POA: Diagnosis not present

## 2021-06-14 DIAGNOSIS — M25612 Stiffness of left shoulder, not elsewhere classified: Secondary | ICD-10-CM | POA: Diagnosis not present

## 2021-06-14 DIAGNOSIS — M6281 Muscle weakness (generalized): Secondary | ICD-10-CM | POA: Diagnosis not present

## 2021-06-14 DIAGNOSIS — R2681 Unsteadiness on feet: Secondary | ICD-10-CM | POA: Diagnosis not present

## 2021-06-14 DIAGNOSIS — R41840 Attention and concentration deficit: Secondary | ICD-10-CM | POA: Diagnosis not present

## 2021-06-14 NOTE — Therapy (Signed)
[facility] [address] [place], [place], [address] Phone: [phone]   Fax:  [phone]  Occupational Therapy Treatment  Patient Details  Name: Patrick Padilla MRN: [id number] Date of Birth: 03-Jan-1956 Referring Provider (OT): [place]   Encounter Date: 06/14/2021   OT End of Session - 06/14/21 1022     Visit Number 13    Number of Visits 17    Date for OT Re-Evaluation 06/27/21    Authorization Type Medicare A&B, 20% coinsurance    Progress Note Due on Visit 74    OT Start Time 1018    OT Stop Time 1058    OT Time Calculation (min) 40 min    Activity Tolerance Patient tolerated treatment well    Behavior During Therapy WFL for tasks assessed/performed             Past Medical History:  Diagnosis Date   Anxiety    Elevated LFTs    History of colon polyps    HLD (hyperlipidemia)    HTN (hypertension)    Localized superficial swelling, mass, or lump     Past Surgical History:  Procedure Laterality Date   CARDIOVERSION N/A 02/05/2019   Procedure: CARDIOVERSION;  Surgeon: Josue Hector, MD;  Location: [facility];  Service: Cardiovascular;  Laterality: N/A;   CERVICAL DISC SURGERY     IR CT HEAD LTD  03/12/2021   IR PERCUTANEOUS ART THROMBECTOMY/INFUSION INTRACRANIAL INC DIAG ANGIO  03/12/2021   IR US GUIDE VASC ACCESS RIGHT  03/15/2021   LEG SURGERY     right leg and ankle   NECK SURGERY     cyst removed   RADIOLOGY WITH ANESTHESIA N/A 03/12/2021   Procedure: RADIOLOGY WITH ANESTHESIA;  Surgeon: Radiologist, Medication, MD;  Location: [facility];  Service: Radiology;  Laterality: N/A;   TEE WITHOUT CARDIOVERSION N/A 02/05/2019   Procedure: TRANSESOPHAGEAL ECHOCARDIOGRAM (TEE);  Surgeon: Josue Hector, MD;  Location: [facility] ENDOSCOPY;  Service: Cardiovascular;  Laterality: N/A;   TOTAL HIP ARTHROPLASTY     left    There were no vitals filed for this visit.   Subjective Assessment - 06/14/21 1021      Subjective  "my weekend was uneventful"    Pertinent History HTN, Afib, Anxiety, Multiple ortho injuries    Currently in Pain? Yes    Pain Score 5     Pain Location Shoulder    Pain Orientation Left    Pain Descriptors / Indicators Tightness    Pain Type Chronic pain    Pain Onset More than a month ago    Pain Frequency Constant                          OT Treatments/Exercises (OP) - 06/14/21 1026       Shoulder Exercises: Supine   Protraction AROM;AAROM;Both;10 reps;Other (comment)   with medium ball and squeezing ball during for active resistance   Theraband Level (Shoulder Protraction) Level 1 (Yellow)   with active tension and resistance for chest press x 10   Horizontal ABduction AROM;AAROM;Both;10 reps;Other (comment)   with medium ball and squeezing ball during for active resistance - stay in pain free zone - increaesd pain reported with this exercise   Flexion AROM;AAROM;Both;10 reps;Other (comment)   with medium ball and squeezing ball during for active resistance   Theraband Level (Shoulder Flexion) Level 1 (Yellow)   for active resistance tension with flexion     Shoulder  Exercises: Seated   Other Seated Exercises down to forearm LUE and pushing up with hand x 10 reps.    Other Seated Exercises down to forearm on RUE and reaching overhead with LUE and across body to target with LUE. No report of increased pain      Work Engineer, technical sales Arm Bike level 2 x 6 minutes for normal movement patterns, conditioning and recprical movements. (forward and backward)      Functional Reaching Activities   Mid Level rubber washers with LUE                      OT Short Term Goals - 05/18/21 1152       OT SHORT TERM GOAL #1   Title Patient will complete an HEP designed to address left shoulder pain and range of motion    Time 4    Period Weeks    Status Achieved      OT SHORT TERM GOAL #2   Title Patient will demonstrate  improved safety and confidence (patient report) with transferring into and out of shower.    Time 4    Period Weeks    Status Achieved      OT SHORT TERM GOAL #3   Title Patient will demonstrate improved performance - decreased time, decreased pain in donning/doffing socks    Time 4    Period Weeks    Status Achieved   pt reports this is much better 05/13/21     OT SHORT TERM GOAL #4   Title Patient will prepare simple warm meal for himself with no more than supervision    Time 4    Period Weeks    Status Achieved               OT Long Term Goals - 06/14/21 1039       OT LONG TERM GOAL #1   Title Patient will complete updated HEP designed to increase range of motion, strength and functional use of non-dominant LUE    Time 8    Period Weeks    Status On-going      OT LONG TERM GOAL #2   Title Patient will return to mowing 50% of his lawn with minimal assistance    Time 8    Period Weeks    Status Unable to assess      OT LONG TERM GOAL #3   Title Patient will return to working with hand (versus power)  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    Status On-going      OT LONG TERM GOAL #4   Title Patient will report decrease in pain with reaching patterns with LUE through mid to high reach with pain no more than 1-2/10.    Time 8    Period Weeks    Status On-going      OT LONG TERM GOAL #5   Title Patient will demonstrate 5 lb increase in left grip strength    Status Achieved   85.7 lbs on 06/14/21     OT LONG TERM GOAL #6   Title Patient will complete FOTO survey on discharge with at least 10 point improvement (55 UE on eval)    Time 8    Period Weeks    Status On-going                   Plan - 06/14/21 1044  Clinical Impression Statement Pt continues to report significant pain and discomfort in LUE.    OT Occupational Profile and History Detailed Assessment- Review of Records and additional review of physical, cognitive,  psychosocial history related to current functional performance    Occupational performance deficits (Please refer to evaluation for details): ADL's;IADL's;Leisure    Body Structure / Function / Physical Skills ADL;Decreased knowledge of use of DME;Strength;Balance;Dexterity;GMC;Pain;Tone;Body mechanics;Hearing;UE functional use;Cardiopulmonary status limiting activity;Endurance;IADL;ROM;Vestibular;Coordination;Flexibility;Mobility;Sensation;FMC;Decreased knowledge of precautions    Rehab Potential Good    Clinical Decision Making Several treatment options, min-mod task modification necessary    Comorbidities Affecting Occupational Performance: May have comorbidities impacting occupational performance    Modification or Assistance to Complete Evaluation  Min-Moderate modification of tasks or assist with assess necessary to complete eval    OT Frequency 2x / week    OT Duration 8 weeks    OT Treatment/Interventions Self-care/ADL training;Moist Heat;Fluidtherapy;DME and/or AE instruction;Balance training;Aquatic Therapy;Therapeutic activities;Ultrasound;Therapeutic exercise;Cognitive remediation/compensation;Neuromuscular education;Functional Mobility Training;Passive range of motion;Visual/perceptual remediation/compensation;Electrical Stimulation;Manual Therapy;Patient/family education    Plan check putty exercises - NMR LUE - closed to open chain, mid to high reach active assisted - in pain free ranges    Consulted and Agree with Plan of Care Patient             Patient will benefit from skilled therapeutic intervention in order to improve the following deficits and impairments:   Body Structure / Function / Physical Skills: ADL, Decreased knowledge of use of DME, Strength, Balance, Dexterity, GMC, Pain, Tone, Body mechanics, Hearing, UE functional use, Cardiopulmonary status limiting activity, Endurance, IADL, ROM, Vestibular, Coordination, Flexibility, Mobility, Sensation, FMC, Decreased  knowledge of precautions       Visit Diagnosis: Unsteadiness on feet  Muscle weakness (generalized)  Acute pain of left shoulder  Stiffness of left shoulder, not elsewhere classified  Attention and concentration deficit    Problem List Patient Active Problem List   Diagnosis Date Noted   Dysphagia    Leucocytosis    Benign essential HTN    Acute gout of right knee    Acute ischemic right MCA stroke ([place]) 03/12/2021   Alcohol abuse    Seizure ([place])    Essential hypertension 12/26/2019   Longstanding persistent atrial fibrillation (HCC)    Atrial fibrillation with RVR ([place]) 02/03/2019   Hyperlipidemia 02/03/2019   Anxiety 02/03/2019    Zachery Conch, OT 06/14/2021, 11:00 AM  [facility] [address] [place], [place], [address] Phone: [phone]   Fax:  [phone]  Name: Patrick Padilla MRN: [id number] Date of Birth: 15-Jan-1956

## 2021-06-17 ENCOUNTER — Encounter: Payer: Self-pay | Admitting: Occupational Therapy

## 2021-06-17 ENCOUNTER — Ambulatory Visit: Payer: Medicare Other | Admitting: Physical Therapy

## 2021-06-17 ENCOUNTER — Other Ambulatory Visit: Payer: Self-pay

## 2021-06-17 ENCOUNTER — Ambulatory Visit: Payer: Medicare Other | Admitting: Occupational Therapy

## 2021-06-17 DIAGNOSIS — R41840 Attention and concentration deficit: Secondary | ICD-10-CM

## 2021-06-17 DIAGNOSIS — R2681 Unsteadiness on feet: Secondary | ICD-10-CM | POA: Diagnosis not present

## 2021-06-17 DIAGNOSIS — M6281 Muscle weakness (generalized): Secondary | ICD-10-CM

## 2021-06-17 DIAGNOSIS — M25512 Pain in left shoulder: Secondary | ICD-10-CM | POA: Diagnosis not present

## 2021-06-17 DIAGNOSIS — M25612 Stiffness of left shoulder, not elsewhere classified: Secondary | ICD-10-CM

## 2021-06-17 NOTE — Therapy (Signed)
[facility] [address] [place], [place], [address] Phone: [phone]   Fax:  [phone]  Occupational Therapy Treatment  Patient Details  Name: Patrick Padilla MRN: [id number] Date of Birth: 24-Jun-1956 Referring Provider (OT): Roy   Encounter Date: 06/17/2021   OT End of Session - 06/17/21 0806     Visit Number 14    Number of Visits 17    Date for OT Re-Evaluation 06/27/21    Authorization Type Medicare A&B, 20% coinsurance    Progress Note Due on Visit 41    OT Start Time 0804    OT Stop Time 0843    OT Time Calculation (min) 39 min    Activity Tolerance Patient tolerated treatment well    Behavior During Therapy [facility] for tasks assessed/performed             Past Medical History:  Diagnosis Date   Anxiety    Elevated LFTs    History of colon polyps    HLD (hyperlipidemia)    HTN (hypertension)    Localized superficial swelling, mass, or lump     Past Surgical History:  Procedure Laterality Date   CARDIOVERSION N/A 02/05/2019   Procedure: CARDIOVERSION;  Surgeon: Josue Hector, MD;  Location: [facility];  Service: Cardiovascular;  Laterality: N/A;   CERVICAL DISC SURGERY     IR CT HEAD LTD  03/12/2021   IR PERCUTANEOUS ART THROMBECTOMY/INFUSION INTRACRANIAL INC DIAG ANGIO  03/12/2021   IR US GUIDE VASC ACCESS RIGHT  03/15/2021   LEG SURGERY     right leg and ankle   NECK SURGERY     cyst removed   RADIOLOGY WITH ANESTHESIA N/A 03/12/2021   Procedure: RADIOLOGY WITH ANESTHESIA;  Surgeon: Radiologist, Medication, MD;  Location: [facility];  Service: Radiology;  Laterality: N/A;   TEE WITHOUT CARDIOVERSION N/A 02/05/2019   Procedure: TRANSESOPHAGEAL ECHOCARDIOGRAM (TEE);  Surgeon: Josue Hector, MD;  Location: [facility] ENDOSCOPY;  Service: Cardiovascular;  Laterality: N/A;   TOTAL HIP ARTHROPLASTY     left    There were no vitals filed for this visit.   Subjective Assessment - 06/17/21 0805      Subjective  "i left my phone in the uber and will never see it again"    Pertinent History HTN, Afib, Anxiety, Multiple ortho injuries    Currently in Pain? Yes    Pain Score 3     Pain Location Shoulder    Pain Orientation Left    Pain Descriptors / Indicators Aching;Sore    Pain Type Chronic pain    Pain Onset More than a month ago    Pain Frequency Constant              Laundry with placing laundry into dryer and then getting out and carrying full basket to surface approx 20 ft away. No LOB and with no reports of pain.  Wall/corner horizontal abduction with back to corner with medium ball x 10 reps with no report of increased pain or discomfort. Forearms on ball in standing with shoulder flexion x 10 reps in pain free zone.  Physioball in sitting x 10 reps for forward reaching and stretch. No pain reported  Wall push ups x 10  Resistance Clothespins  w LUE with reaching to side and then up to antenna with no reports of pain but pt reports some discomfort ("I can feel me doing it"). Pt reports pain at approx 120 degrees of shoulder flexion and encouraged  to brings scapula down and back with decreased pain reported.  Wall Slides/Door Stretch  with LUE   Hand PowerWeb Exerciser x 10 blue side for finger flexion and extension for grip strengthening                 OT Short Term Goals - 05/18/21 1152       OT SHORT TERM GOAL #1   Title Patient will complete an HEP designed to address left shoulder pain and range of motion    Time 4    Period Weeks    Status Achieved      OT SHORT TERM GOAL #2   Title Patient will demonstrate improved safety and confidence (patient report) with transferring into and out of shower.    Time 4    Period Weeks    Status Achieved      OT SHORT TERM GOAL #3   Title Patient will demonstrate improved performance - decreased time, decreased pain in donning/doffing socks    Time 4    Period Weeks    Status Achieved   pt reports  this is much better 05/13/21     OT SHORT TERM GOAL #4   Title Patient will prepare simple warm meal for himself with no more than supervision    Time 4    Period Weeks    Status Achieved               OT Long Term Goals - 06/14/21 1039       OT LONG TERM GOAL #1   Title Patient will complete updated HEP designed to increase range of motion, strength and functional use of non-dominant LUE    Time 8    Period Weeks    Status On-going      OT LONG TERM GOAL #2   Title Patient will return to mowing 50% of his lawn with minimal assistance    Time 8    Period Weeks    Status Unable to assess      OT LONG TERM GOAL #3   Title Patient will return to working with hand (versus power)  tools for household maintenance or work in [employer].    Status On-going      OT LONG TERM GOAL #4   Title Patient will report decrease in pain with reaching patterns with LUE through mid to high reach with pain no more than 1-2/10.    Time 8    Period Weeks    Status On-going      OT LONG TERM GOAL #5   Title Patient will demonstrate 5 lb increase in left grip strength    Status Achieved   85.7 lbs on 06/14/21     OT LONG TERM GOAL #6   Title Patient will complete FOTO survey on discharge with at least 10 point improvement (55 UE on eval)    Time 8    Period Weeks    Status On-going                   Plan - 06/17/21 0840     Clinical Impression Statement Pt with tolerance for reaching activities today and responded well to feedback on scap stabilization for reaching.    OT Occupational Profile and History Detailed Assessment- Review of Records and additional review of physical, cognitive, psychosocial history related to current functional performance    Occupational performance deficits (Please refer to evaluation for details): ADL's;IADL's;Leisure    Body Structure /  Function / Physical Skills ADL;Decreased knowledge of use of DME;Strength;Balance;Dexterity;GMC;Pain;Tone;Body  mechanics;Hearing;UE functional use;Cardiopulmonary status limiting activity;Endurance;IADL;ROM;Vestibular;Coordination;Flexibility;Mobility;Sensation;FMC;Decreased knowledge of precautions    Rehab Potential Good    Clinical Decision Making Several treatment options, min-mod task modification necessary    Comorbidities Affecting Occupational Performance: May have comorbidities impacting occupational performance    Modification or Assistance to Complete Evaluation  Min-Moderate modification of tasks or assist with assess necessary to complete eval    OT Frequency 2x / week    OT Duration 8 weeks    OT Treatment/Interventions Self-care/ADL training;Moist Heat;Fluidtherapy;DME and/or AE instruction;Balance training;Aquatic Therapy;Therapeutic activities;Ultrasound;Therapeutic exercise;Cognitive remediation/compensation;Neuromuscular education;Functional Mobility Training;Passive range of motion;Visual/perceptual remediation/compensation;Electrical Stimulation;Manual Therapy;Patient/family education    Plan check putty exercises - NMR LUE - closed to open chain, mid to high reach active assisted - in pain free ranges    Consulted and Agree with Plan of Care Patient             Patient will benefit from skilled therapeutic intervention in order to improve the following deficits and impairments:   Body Structure / Function / Physical Skills: ADL, Decreased knowledge of use of DME, Strength, Balance, Dexterity, GMC, Pain, Tone, Body mechanics, Hearing, UE functional use, Cardiopulmonary status limiting activity, Endurance, IADL, ROM, Vestibular, Coordination, Flexibility, Mobility, Sensation, FMC, Decreased knowledge of precautions       Visit Diagnosis: Unsteadiness on feet  Acute pain of left shoulder  Stiffness of left shoulder, not elsewhere classified  Muscle weakness (generalized)  Attention and concentration deficit    Problem List Patient Active Problem List   Diagnosis Date  Noted   Dysphagia    Leucocytosis    Benign essential HTN    Acute gout of right knee    Acute ischemic right MCA stroke (HCC) 03/12/2021   Alcohol abuse    Seizure (HCC)    Essential hypertension 12/26/2019   Longstanding persistent atrial fibrillation (HCC)    Atrial fibrillation with RVR (HCC) 02/03/2019   Hyperlipidemia 02/03/2019   Anxiety 02/03/2019    Junious Dresser, OT 06/17/2021, 8:41 AM  [facility] [address] [place], [place], [address] Phone: [phone]   Fax:  [phone]  Name: Letroy Vazguez MRN: [id number] Date of Birth: 11-Dec-1955

## 2021-06-17 NOTE — Patient Instructions (Addendum)
Access Code: [facility] URL: [email or web address] Date: 06/17/2021 Prepared by: Bufford Lope  Exercises Tandem Walking with Counter Support - 2 x daily - 7 x weekly - 2 sets - 10 reps Supine Bridge - 2 x daily - 7 x weekly - 2 sets - 15 reps Sidelying Hip Abduction - 2 x daily - 7 x weekly - 2 sets - 15 reps Seated Hamstring Stretch - 1-2 x daily - 5 x weekly - 2 sets - 10 reps - 30-33min hold SIT TO STAND ON A CUSHION/PILLOW - 1 x daily - 7 x weekly - 2 sets - 5 reps Backward Walking with Counter Support - 1 x daily - 7 x weekly - 4 sets Romberg Stance with Head Rotation - 1 x daily - 7 x weekly - 1 sets - 10 reps Romberg Stance with Head Nods - 1 x daily - 7 x weekly - 1 sets - 10 reps

## 2021-06-17 NOTE — Therapy (Signed)
[facility] [address] [place], [place], [address] Phone: [phone]   Fax:  [phone]  Physical Therapy Treatment  Patient Details  Name: Patrick Padilla MRN: [id number] Date of Birth: [birth year] No data recorded  Encounter Date: 06/17/2021   PT End of Session - 06/17/21 1126     Visit Number 14    Number of Visits 17    Date for PT Re-Evaluation 07/09/21    Authorization Type MCR/MCD    Progress Note Due on Visit 20    PT Start Time 0720    PT Stop Time 0802    PT Time Calculation (min) 42 min    Activity Tolerance Patient tolerated treatment well    Behavior During Therapy [facility] for tasks assessed/performed             Past Medical History:  Diagnosis Date   Anxiety    Elevated LFTs    History of colon polyps    HLD (hyperlipidemia)    HTN (hypertension)    Localized superficial swelling, mass, or lump     Past Surgical History:  Procedure Laterality Date   CARDIOVERSION N/A 02/05/2019   Procedure: CARDIOVERSION;  Surgeon: Josue Hector, MD;  Location: [facility];  Service: Cardiovascular;  Laterality: N/A;   CERVICAL DISC SURGERY     IR CT HEAD LTD  03/12/2021   IR PERCUTANEOUS ART THROMBECTOMY/INFUSION INTRACRANIAL INC DIAG ANGIO  03/12/2021   IR US GUIDE VASC ACCESS RIGHT  03/15/2021   LEG SURGERY     right leg and ankle   NECK SURGERY     cyst removed   RADIOLOGY WITH ANESTHESIA N/A 03/12/2021   Procedure: RADIOLOGY WITH ANESTHESIA;  Surgeon: Radiologist, Medication, MD;  Location: [facility];  Service: Radiology;  Laterality: N/A;   TEE WITHOUT CARDIOVERSION N/A 02/05/2019   Procedure: TRANSESOPHAGEAL ECHOCARDIOGRAM (TEE);  Surgeon: Josue Hector, MD;  Location: [facility] ENDOSCOPY;  Service: Cardiovascular;  Laterality: N/A;   TOTAL HIP ARTHROPLASTY     left       Subjective Assessment - 06/17/21 0721     Subjective L shoulder is still very painful, worried he might be doing too much; concerned  because it has been 3 months and hasn't changed.  Dizziness is getting better, thinks he had some of his medication mixed up.  Thinks they took him off of Lopressor.  Forgot his phone in the [place].    Patient is accompained by: Family member    Pertinent History MRI+acute infarct at the right basal ganglia. Few and  punctate bifrontal peripheral infarcts. Recent medial left thalamic infarct which may be subacute. 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    Limitations Standing;Walking    How long can you sit comfortably? >15 min    How long can you stand comfortably? <15 min    How long can you walk comfortably? <15 min    Patient Stated Goals To become more steady on his feet and return to outside ambulation    Currently in Pain? Yes    Pain Onset More than a month ago                [facility] PT Assessment - 06/17/21 0732       Functional Gait  Assessment   Gait assessed  Yes    Gait Level Surface Walks 20 ft in less than 7 sec but greater than 5.5 sec, uses assistive device, slower speed, mild gait deviations,  or deviates 6-10 in outside of the 12 in walkway width.    Change in Gait Speed Makes only minor adjustments to walking speed, or accomplishes a change in speed with significant gait deviations, deviates 10-15 in outside the 12 in walkway width, or changes speed but loses balance but is able to recover and continue walking.    Gait with Horizontal Head Turns Performs head turns smoothly with slight change in gait velocity (eg, minor disruption to smooth gait path), deviates 6-10 in outside 12 in walkway width, or uses an assistive device.    Gait with Vertical Head Turns Performs task with slight change in gait velocity (eg, minor disruption to smooth gait path), deviates 6 - 10 in outside 12 in walkway width or uses assistive device    Gait and Pivot Turn Pivot turns safely within 3 sec and stops quickly with no loss of balance.     Step Over Obstacle Is able to step over 2 stacked shoe boxes taped together (9 in total height) without changing gait speed. No evidence of imbalance.    Gait with Narrow Base of Support Ambulates 7-9 steps.    Gait with Eyes Closed Walks 20 ft, slow speed, abnormal gait pattern, evidence for imbalance, deviates 10-15 in outside 12 in walkway width. Requires more than 9 sec to ambulate 20 ft.    Ambulating Backwards Cannot walk 20 ft without assistance, severe gait deviations or imbalance, deviates greater than 15 in outside 12 in walkway width or will not attempt task.    Steps Alternating feet, no rail.    Total Score 19    FGA comment: 19/30; dizziness with looking up and with eyes closed             Reviewed the following exercises below in bold.  Pt able to perform each exercise safely with supervision; UE support only for backwards walking for increased proprioceptive input. Performed sit > stand without UE support first on solid surface with EC without any difficulty.  Changed to standing on compliant surface with EC.     Access Code: [facility] URL: [email or web address] Date: 06/17/2021 Prepared by: Misty Stanley  Exercises Tandem Walking with Counter Support - 2 x daily - 7 x weekly - 2 sets - 10 reps Supine Bridge - 2 x daily - 7 x weekly - 2 sets - 15 reps Sidelying Hip Abduction - 2 x daily - 7 x weekly - 2 sets - 15 reps Seated Hamstring Stretch - 1-2 x daily - 5 x weekly - 2 sets - 10 reps - 30-27mn hold SIT TO STAND ON A CUSHION/PILLOW, EYES CLOSED- 1 x daily - 7 x weekly - 2 sets - 5 reps Backward Walking with Counter Support - 1 x daily - 7 x weekly - 4 sets Romberg Stance with Head Rotation - 1 x daily - 7 x weekly - 1 sets - 10 reps Romberg Stance with Head Nods - 1 x daily - 7 x weekly - 1 sets - 10 reps     PT Education - 06/17/21 1125     Education Details increased balance challenge on HEP    Person(s) Educated Patient    Methods  Explanation;Demonstration;Handout    Comprehension Verbalized understanding;Returned demonstration              PT Short Term Goals - 04/29/21 1104       PT SHORT TERM GOAL #1   Title Patient to become Ind in initial HEP  Baseline [id number]; 04/29/21 Compliant with HEP    Time 3    Period Weeks    Status Achieved    Target Date 05/11/21      PT SHORT TERM GOAL #2   Title Patient to negotiate 16 steps with most appropriate pattern and support    Baseline 8 steps with alternating pattern and B rails; 04/25/21 16 steps with single rail and step through pattern    Time 3    Period Weeks    Status Achieved    Target Date 05/11/21      PT SHORT TERM GOAL #3   Title Patient to transfer STS 1x w/o UE support    Baseline Requires BUE support to stand; 04/25/21 Able to stand once arms crossed followed by 4 more with CGA    Time 3    Period Weeks    Status Achieved    Target Date 05/11/21      PT SHORT TERM GOAL #4   Title Patient to ambulate 52f across outdoor surfaces w/o need of AD    Baseline 2043findoors w/o need of AD; 04/29/21 Ambulation of 100061fcross paved and grassy surfaces w/o need of AD    Time 3    Period Weeks    Status Achieved    Target Date 06/01/21               PT Long Term Goals - 06/17/21 1127       PT LONG TERM GOAL #1   Title Patient to decreased 5x STS time to <12s w/o UE assist    Baseline 15.4s with BUE assist; 05/10/21 13.0s; 06/08/21 Score 13.4s    Time 4    Period Weeks    Status On-going    Target Date 07/09/21      PT LONG TERM GOAL #2   Title Patient to increase score of FGA to 26/30    Baseline 20/30; 05/10/21 FGA score 25/30; 06/08/21 FGA score decreased to 19/30    Time 4    Period Weeks    Status On-going    Target Date 07/09/21      PT LONG TERM GOAL #3   Title Patient to negotiate 16 steps with alternting pattern w/o UE support    Baseline 8 steps with alternating pattern and B UE support; 05/10/21 Able to  negotiate 16 steps with single UE support    Time 4    Period Weeks    Status Partially Met    Target Date 07/09/21      PT LONG TERM GOAL #4   Title Ambulate 1000f62fross outdoor surfaces w/o need of AD    Baseline 200ft30fclinic w/o AD; 05/10/21 1200ft 17fss sloped and grass surfaces w/o AD under S    Time 4    Period Weeks    Status On-going    Target Date 07/09/21      PT LONG TERM GOAL #5   Title Increase FOTO score to 71    Baseline Initial score 55    Time 4    Period Weeks    Status On-going    Target Date 07/09/21                   Plan - 06/17/21 1129     Clinical Impression Statement Pt reporting improvement in dizziness today with removal of one medication; BP continues to run hypotensive in sitting and standing with pt reporting mild orthostatic symptoms but able to  continue.  Performed re-assessment of FGA due to improvement in dizziness today; score remained the same.  Focused rest of session on addition of exercises to HEP with increased focus on dynamic standing balance and proprioceptive input to L side.  Will continue to address and progress towards LTG.    Personal Factors and Comorbidities Comorbidity 1    Comorbidities CVA    Examination-Activity Limitations Locomotion Level;Transfers    Stability/Clinical Decision Making Stable/Uncomplicated    Rehab Potential Good    PT Frequency 1x / week    PT Duration 4 weeks    PT Treatment/Interventions ADLs/Self Care Home Management;Aquatic Therapy;DME Instruction;Gait training;Stair training;Functional mobility training;Therapeutic activities;Balance training;Neuromuscular re-education;Therapeutic exercise;Patient/family education    PT Next Visit Plan WB and use of L side to improve proprioceptive input.  Standing balance on compliant surfaces, EC.  higher level gait and balance training.    PT Home Exercise Plan [facility]    Consulted and Agree with Plan of Care Patient             Patient will  benefit from skilled therapeutic intervention in order to improve the following deficits and impairments:  Abnormal gait, Difficulty walking, Decreased endurance, Decreased activity tolerance, Decreased balance, Decreased strength  Visit Diagnosis: Unsteadiness on feet  Muscle weakness (generalized)     Problem List Patient Active Problem List   Diagnosis Date Noted   Dysphagia    Leucocytosis    Benign essential HTN    Acute gout of right knee    Acute ischemic right MCA stroke ([place]) 03/12/2021   Alcohol abuse    Seizure ([place])    Essential hypertension 12/26/2019   Longstanding persistent atrial fibrillation (HCC)    Atrial fibrillation with RVR ([place]) 02/03/2019   Hyperlipidemia 02/03/2019   Anxiety 02/03/2019    Rico Junker, PT, DPT 06/17/21    11:37 AM    [facility] [address] [place], [place], [address] Phone: [phone]   Fax:  [phone]  Name: Patrick Padilla MRN: [id number] Date of Birth: 13-Jun-1956

## 2021-06-21 ENCOUNTER — Encounter: Payer: Self-pay | Admitting: Occupational Therapy

## 2021-06-21 ENCOUNTER — Other Ambulatory Visit: Payer: Self-pay

## 2021-06-21 ENCOUNTER — Ambulatory Visit: Payer: Medicare Other | Admitting: Occupational Therapy

## 2021-06-21 DIAGNOSIS — R2681 Unsteadiness on feet: Secondary | ICD-10-CM | POA: Diagnosis not present

## 2021-06-21 DIAGNOSIS — M6281 Muscle weakness (generalized): Secondary | ICD-10-CM

## 2021-06-21 DIAGNOSIS — M25512 Pain in left shoulder: Secondary | ICD-10-CM | POA: Diagnosis not present

## 2021-06-21 DIAGNOSIS — M25612 Stiffness of left shoulder, not elsewhere classified: Secondary | ICD-10-CM | POA: Diagnosis not present

## 2021-06-21 DIAGNOSIS — R41840 Attention and concentration deficit: Secondary | ICD-10-CM

## 2021-06-21 NOTE — Therapy (Signed)
[facility] [address] [place], [place], [address] Phone: [phone]   Fax:  [phone]  Occupational Therapy Treatment  Patient Details  Name: Patrick Padilla MRN: [id number] Date of Birth: [birth year] Referring Provider (OT): Angiulli   Encounter Date: 06/21/2021   OT End of Session - 06/21/21 1101     Visit Number 15    Number of Visits 17    Date for OT Re-Evaluation 06/27/21    Authorization Type Medicare A&B, 20% coinsurance    Progress Note Due on Visit 20    OT Start Time 1015    OT Stop Time 1055    OT Time Calculation (min) 40 min    Activity Tolerance Patient tolerated treatment well    Behavior During Therapy WFL for tasks assessed/performed             Past Medical History:  Diagnosis Date   Anxiety    Elevated LFTs    History of colon polyps    HLD (hyperlipidemia)    HTN (hypertension)    Localized superficial swelling, mass, or lump     Past Surgical History:  Procedure Laterality Date   CARDIOVERSION N/A 02/05/2019   Procedure: CARDIOVERSION;  Surgeon: Wendall Stade, MD;  Location: [facility] ENDOSCOPY;  Service: Cardiovascular;  Laterality: N/A;   CERVICAL DISC SURGERY     IR CT HEAD LTD  03/12/2021   IR PERCUTANEOUS ART THROMBECTOMY/INFUSION INTRACRANIAL INC DIAG ANGIO  03/12/2021   IR US GUIDE VASC ACCESS RIGHT  03/15/2021   LEG SURGERY     right leg and ankle   NECK SURGERY     cyst removed   RADIOLOGY WITH ANESTHESIA N/A 03/12/2021   Procedure: RADIOLOGY WITH ANESTHESIA;  Surgeon: Radiologist, Medication, MD;  Location: [facility] OR;  Service: Radiology;  Laterality: N/A;   TEE WITHOUT CARDIOVERSION N/A 02/05/2019   Procedure: TRANSESOPHAGEAL ECHOCARDIOGRAM (TEE);  Surgeon: Wendall Stade, MD;  Location: [facility] ENDOSCOPY;  Service: Cardiovascular;  Laterality: N/A;   TOTAL HIP ARTHROPLASTY     left    There were no vitals filed for this visit.   Subjective Assessment - 06/21/21 1046      Subjective  I am doing good as long as I do not use my arm    Pertinent History HTN, Afib, Anxiety, Multiple ortho injuries    Currently in Pain? Yes    Pain Score 2     Pain Location Shoulder    Pain Orientation Left    Pain Descriptors / Indicators Aching;Sore    Pain Type Chronic pain    Pain Onset More than a month ago    Pain Frequency Intermittent    Aggravating Factors  reaching    Pain Relieving Factors rest                          OT Treatments/Exercises (OP) - 06/21/21 0001       ADLs   ADL Comments Discussed end of current plan of care - patien tstill with proximal weakness and muscle imbalance - would recommend extending this plan of care. Patient will decide and let us know next visit.      Neurological Re-education Exercises   Other Exercises 1 Neuromuscular reeducation to address closed toward open chain activity in lUE for reacvhing activity.  Worked to increase abduction, flexion, and external rotation.  Followed up with modified closed chain on wall with ball with emphasis on posture and scapular  depression - thoracic extension.                      OT Short Term Goals - 06/21/21 1200       OT SHORT TERM GOAL #1   Title Patient will complete an HEP designed to address left shoulder pain and range of motion    Time 4    Period Weeks    Status Achieved      OT SHORT TERM GOAL #2   Title Patient will demonstrate improved safety and confidence (patient report) with transferring into and out of shower.    Time 4    Period Weeks    Status Achieved      OT SHORT TERM GOAL #3   Title Patient will demonstrate improved performance - decreased time, decreased pain in donning/doffing socks    Time 4    Period Weeks    Status Achieved   pt reports this is much better 05/13/21     OT SHORT TERM GOAL #4   Title Patient will prepare simple warm meal for himself with no more than supervision    Time 4    Period Weeks    Status Achieved                OT Long Term Goals - 06/21/21 1200       OT LONG TERM GOAL #1   Title Patient will complete updated HEP designed to increase range of motion, strength and functional use of non-dominant LUE    Time 8    Period Weeks    Status On-going      OT LONG TERM GOAL #2   Title Patient will return to mowing 50% of his lawn with minimal assistance    Time 8    Period Weeks    Status Unable to assess      OT LONG TERM GOAL #3   Title Patient will return to working with hand (versus power)  tools for household maintenance or work in [employer].    Status On-going      OT LONG TERM GOAL #4   Title Patient will report decrease in pain with reaching patterns with LUE through mid to high reach with pain no more than 1-2/10.    Time 8    Period Weeks    Status On-going      OT LONG TERM GOAL #5   Title Patient will demonstrate 5 lb increase in left grip strength    Status Achieved   85.7 lbs on 06/14/21     OT LONG TERM GOAL #6   Title Patient will complete FOTO survey on discharge with at least 10 point improvement (55 UE on eval)    Time 8    Period Weeks    Status On-going                   Plan - 06/21/21 1101     Clinical Impression Statement Pt still wiht pain during mid to high reach due to imbalance and weakness in left proximal UE    OT Occupational Profile and History Detailed Assessment- Review of Records and additional review of physical, cognitive, psychosocial history related to current functional performance    Occupational performance deficits (Please refer to evaluation for details): ADL's;IADL's;Leisure    Body Structure / Function / Physical Skills ADL;Decreased knowledge of use of DME;Strength;Balance;Dexterity;GMC;Pain;Tone;Body mechanics;Hearing;UE functional use;Cardiopulmonary status limiting activity;Endurance;IADL;ROM;Vestibular;Coordination;Flexibility;Mobility;Sensation;FMC;Decreased knowledge of precautions    Rehab  Potential Good     Clinical Decision Making Several treatment options, min-mod task modification necessary    Comorbidities Affecting Occupational Performance: May have comorbidities impacting occupational performance    Modification or Assistance to Complete Evaluation  Min-Moderate modification of tasks or assist with assess necessary to complete eval    OT Frequency 2x / week    OT Duration 8 weeks    OT Treatment/Interventions Self-care/ADL training;Moist Heat;Fluidtherapy;DME and/or AE instruction;Balance training;Aquatic Therapy;Therapeutic activities;Ultrasound;Therapeutic exercise;Cognitive remediation/compensation;Neuromuscular education;Functional Mobility Training;Passive range of motion;Visual/perceptual remediation/compensation;Electrical Stimulation;Manual Therapy;Patient/family education    Plan Hoping he wants to extend plan of care - NMR LUE    Consulted and Agree with Plan of Care Patient             Patient will benefit from skilled therapeutic intervention in order to improve the following deficits and impairments:   Body Structure / Function / Physical Skills: ADL, Decreased knowledge of use of DME, Strength, Balance, Dexterity, GMC, Pain, Tone, Body mechanics, Hearing, UE functional use, Cardiopulmonary status limiting activity, Endurance, IADL, ROM, Vestibular, Coordination, Flexibility, Mobility, Sensation, FMC, Decreased knowledge of precautions       Visit Diagnosis: Unsteadiness on feet  Muscle weakness (generalized)  Acute pain of left shoulder  Stiffness of left shoulder, not elsewhere classified  Attention and concentration deficit    Problem List Patient Active Problem List   Diagnosis Date Noted   Dysphagia    Leucocytosis    Benign essential HTN    Acute gout of right knee    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 [place], [place], [address] Phone: [phone]   Fax:  [phone]  Name: Patrick Padilla MRN: [id number] Date of Birth: 10/01/1955

## 2021-06-22 ENCOUNTER — Encounter: Payer: Medicare Other | Attending: Physical Medicine & Rehabilitation | Admitting: Physical Medicine & Rehabilitation

## 2021-06-22 ENCOUNTER — Encounter: Payer: Self-pay | Admitting: Physical Medicine & Rehabilitation

## 2021-06-22 VITALS — BP 118/81 | HR 85 | Temp 98.3°F | Ht 72.0 in | Wt 204.2 lb

## 2021-06-22 DIAGNOSIS — I10 Essential (primary) hypertension: Secondary | ICD-10-CM | POA: Insufficient documentation

## 2021-06-22 DIAGNOSIS — M25512 Pain in left shoulder: Secondary | ICD-10-CM | POA: Insufficient documentation

## 2021-06-22 DIAGNOSIS — G8929 Other chronic pain: Secondary | ICD-10-CM | POA: Insufficient documentation

## 2021-06-22 DIAGNOSIS — I63511 Cerebral infarction due to unspecified occlusion or stenosis of right middle cerebral artery: Secondary | ICD-10-CM | POA: Diagnosis not present

## 2021-06-22 MED ORDER — BACLOFEN 10 MG PO TABS: 10.0000 mg | ORAL_TABLET | Freq: Two times a day (BID) | ORAL | 2 refills | Status: DC

## 2021-06-22 NOTE — Progress Notes (Signed)
Subjective:    Patient ID: Patrick Padilla, male    DOB: 09/16/1955, 65 y.o.   MRN: [id number]  HPI  This is a follow-up office visit for Patrick Padilla who I last saw in September while he was still on inpatient rehab after his embolic stroke and PRES. he has been busy in outpatient therapy at neuro rehab and still is involved with OT PT and speech.  He had OT yesterday. He is winding down with therapy.   He has been problems with his left shoulder which has been persistent. He finds it difficult to lift the arm due to pain and weakness.    He had some dizziness after leaving the hospital which finally has improved when his avapro was cut in half. He remains on norvasc . He takes eliquis for his a fib and stroke prophylaxis.     Pain Inventory Average Pain 0 Pain Right Now 0 My pain is dull  LOCATION OF PAIN  shoulder, back  BOWEL Number of stools per week: 3   BLADDER Normal Incomplete bladder emptying Yes    Mobility walk without assistance how many minutes can you walk? 10 ability to climb steps?  yes do you drive?  yes  Function disabled: date disabled 2018  Neuro/Psych bowel control problems weakness spasms dizziness  Prior Studies Any changes since last visit?  no  Physicians involved in your care Any changes since last visit?  no   Family History  Problem Relation Age of Onset   Hepatitis Mother    Clotting disorder Mother    Stroke Mother    Thyroid disease Mother    Colon cancer Father        ?   Rheumatic fever Brother    Heart disease Brother    Social History   Socioeconomic History   Marital status: Married    Spouse name: Not on file   Number of children: 2   Years of education: Not on file   Highest education level: Not on file  Occupational History   Occupation: disabled    Employer: SELF-EMPLOYED  Tobacco Use   Smoking status: Every Day   Smokeless tobacco: Never  Vaping Use   Vaping Use: Never used  Substance and Sexual  Activity   Alcohol use: Yes    Comment: 3-4 a day   Drug use: No   Sexual activity: Not on file  Other Topics Concern   Not on file  Social History Narrative   Not on file   Social Determinants of Health   Financial Resource Strain: Not on file  Food Insecurity: Not on file  Transportation Needs: Not on file  Physical Activity: Not on file  Stress: Not on file  Social Connections: Not on file   Past Surgical History:  Procedure Laterality Date   CARDIOVERSION N/A 02/05/2019   Procedure: CARDIOVERSION;  Surgeon: Wendall Stade, MD;  Location: [facility] ENDOSCOPY;  Service: Cardiovascular;  Laterality: N/A;   CERVICAL DISC SURGERY     IR CT HEAD LTD  03/12/2021   IR PERCUTANEOUS ART THROMBECTOMY/INFUSION INTRACRANIAL INC DIAG ANGIO  03/12/2021   IR US GUIDE VASC ACCESS RIGHT  03/15/2021   LEG SURGERY     right leg and ankle   NECK SURGERY     cyst removed   RADIOLOGY WITH ANESTHESIA N/A 03/12/2021   Procedure: RADIOLOGY WITH ANESTHESIA;  Surgeon: Radiologist, Medication, MD;  Location: [facility] OR;  Service: Radiology;  Laterality: N/A;   TEE WITHOUT CARDIOVERSION  N/A 02/05/2019   Procedure: TRANSESOPHAGEAL ECHOCARDIOGRAM (TEE);  Surgeon: Josue Hector, MD;  Location[facility] ENDOSCOPY;  Service: Cardiovascular;  Laterality: N/A;   TOTAL HIP ARTHROPLASTY     left   Past Medical History:  Diagnosis Date   Anxiety    Elevated LFTs    History of colon polyps    HLD (hyperlipidemia)    HTN (hypertension)    Localized superficial swelling, mass, or lump    Ht 6' (1.829 m)    Wt 204 lb 3.2 oz (92.6 kg)    BMI 27.69 kg/m   Opioid Risk Score:   Fall Risk Score:  `1  Depression screen PHQ 2/9  Depression screen [facility] 2/9 06/22/2021 05/16/2021 04/13/2021  Decreased Interest 0 2 2  Down, Depressed, Hopeless 0 1 1  PHQ - 2 Score 0 3 3  Altered sleeping - 3 3  Tired, decreased energy - 2 2  Change in appetite - 2 2  Feeling bad or failure about yourself  - 2 0  Trouble concentrating - 2 1  Moving  slowly or fidgety/restless - 0 1  Suicidal thoughts - 0 0  PHQ-9 Score - 14 12  Difficult doing work/chores - Somewhat difficult -     Review of Systems  Constitutional: Negative.   HENT: Negative.    Eyes: Negative.   Respiratory: Negative.    Cardiovascular: Negative.   Gastrointestinal: Negative.   Endocrine: Negative.   Genitourinary: Negative.   Musculoskeletal:  Positive for back pain.  Skin: Negative.   Allergic/Immunologic: Negative.   Neurological:  Positive for dizziness and weakness.  Hematological: Negative.   Psychiatric/Behavioral: Negative.        Objective:   Physical Exam  General: Alert and oriented x 3, No apparent distress HEENT: Head is normocephalic, atraumatic, PERRLA, EOMI, sclera anicteric, oral mucosa pink and moist, dentition intact, ext ear canals clear,  Neck: Supple without JVD or lymphadenopathy Heart: Reg rate and rhythm. No murmurs rubs or gallops Chest: CTA bilaterally without wheezes, rales, or rhonchi; no distress Abdomen: Soft, non-tender, non-distended, bowel sounds positive. Extremities: No clubbing, cyanosis, or edema. Pulses are 2+ Psych: Pt's affect is appropriate. Pt is cooperative Skin: Clean and intact without signs of breakdown Neuro:   Patient is alert and oriented x3.  Demonstrates reasonable insight and awareness.  Cranial nerve exam is unremarkable.  Strength is grossly 4+ to 5 out of 5 both upper extremities.  Lower extremity strength is also 4+ to 5 out of 5.  No gross sensory abnormalities today.  No pronator drift.  When he ambulates he does tend to favor the left leg slightly during stance and swing phase and steps down a bit into that sides.  However he does demonstrate reasonable balance and is able to transfer easily and change directions without any problems.  Cerebellar exam is unremarkable. Musculoskeletal: Left shoulder notable for some discomfort with endrange external and internal rotation.  Impingement maneuver is  equivocal to positive.  He does have some tightness in his scapular musculature.  I did not palpate any focal trigger points per se.  Is able to passively abduct him to 100 degrees at least.       Assessment & Plan:  Medical Problem List and Plan: 1.  Functional deficits secondary to embolic CVA, PRES              -outpt therapies, would continue OT at least for a few weeks to help with shoulder mobilization 2.  Antithrombotics: -DVT/anticoagulation:  Eliquis               3. Left shoulder/right knee pain/Pain Management: Local measures with Voltaren gel/ice/heat.              -left shoulder is mostly muscular in the posterior scapular muscles  -will try low dose baclofen and observe  -Injection not warranted based on evaluation and exam today.  -Continue with exercises per occupational therapies. 4. Mood:  paxil             -seems to be in good spirits 5.  H/o A fib s/p CV:   Eliquis                8. HTN:  -improved control, normotensive -Lopressor               norvasc  10mg   -hold avapro if needed after starting baclofen  Fifteen minutes of face to face patient care time were spent during this visit. All questions were encouraged and answered.  Follow up with me in 3 mos .

## 2021-06-22 NOTE — Patient Instructions (Addendum)
FOR SHOULDER SPASMS:  BACLOFEN: 10MG   1/2 TABLET TWICE DAILY FOR 4 DAYS, THEN INCREASE TO FULL TABLET   KEEP AN EYE ON YOUR BLOOD PRESSURE. IF YOUR TOP NUMBER DROPS TO NEAR 100 AFTER YOU START THE BACLOFEN, THEN YOU CAN STOP AVAPRO.

## 2021-06-23 ENCOUNTER — Other Ambulatory Visit: Payer: Self-pay

## 2021-06-23 NOTE — Patient Outreach (Signed)
[facility]) Care Management  06/23/2021  Patrick Padilla April 12, 1956 [id number]   First telephone outreach attempt to obtain mRS. No answer. Left message for returned call.  Vanice Sarah [facility] Management Assistant [phone]

## 2021-06-24 ENCOUNTER — Ambulatory Visit: Payer: Medicare Other | Admitting: Occupational Therapy

## 2021-06-24 ENCOUNTER — Ambulatory Visit: Payer: Medicare Other | Admitting: Physical Therapy

## 2021-06-24 ENCOUNTER — Other Ambulatory Visit: Payer: Self-pay

## 2021-06-24 ENCOUNTER — Encounter: Payer: Self-pay | Admitting: Occupational Therapy

## 2021-06-24 DIAGNOSIS — M25512 Pain in left shoulder: Secondary | ICD-10-CM

## 2021-06-24 DIAGNOSIS — R2681 Unsteadiness on feet: Secondary | ICD-10-CM

## 2021-06-24 DIAGNOSIS — M25612 Stiffness of left shoulder, not elsewhere classified: Secondary | ICD-10-CM

## 2021-06-24 DIAGNOSIS — R41840 Attention and concentration deficit: Secondary | ICD-10-CM | POA: Diagnosis not present

## 2021-06-24 DIAGNOSIS — M6281 Muscle weakness (generalized): Secondary | ICD-10-CM

## 2021-06-24 NOTE — Therapy (Signed)
[facility] [address] [place], [place], [address] Phone: [phone]   Fax:  [phone]  Physical Therapy Treatment  Patient Details  Name: Patrick Padilla MRN: [id number] Date of Birth: 09/01/1955 No data recorded  Encounter Date: 06/24/2021   PT End of Session - 06/24/21 1144     Visit Number 15    Number of Visits 17    Date for PT Re-Evaluation 07/09/21    Authorization Type MCR/MCD    Progress Note Due on Visit 20    PT Start Time 0805    PT Stop Time 0846    PT Time Calculation (min) 41 min    Activity Tolerance Patient tolerated treatment well    Behavior During Therapy [facility] for tasks assessed/performed             Past Medical History:  Diagnosis Date   Anxiety    Elevated LFTs    History of colon polyps    HLD (hyperlipidemia)    HTN (hypertension)    Localized superficial swelling, mass, or lump     Past Surgical History:  Procedure Laterality Date   CARDIOVERSION N/A 02/05/2019   Procedure: CARDIOVERSION;  Surgeon: Josue Hector, MD;  Location: [place];  Service: Cardiovascular;  Laterality: N/A;   CERVICAL DISC SURGERY     IR CT HEAD LTD  03/12/2021   IR PERCUTANEOUS ART THROMBECTOMY/INFUSION INTRACRANIAL INC DIAG ANGIO  03/12/2021   IR US GUIDE VASC ACCESS RIGHT  03/15/2021   LEG SURGERY     right leg and ankle   NECK SURGERY     cyst removed   RADIOLOGY WITH ANESTHESIA N/A 03/12/2021   Procedure: RADIOLOGY WITH ANESTHESIA;  Surgeon: Radiologist, Medication, MD;  Location: [place];  Service: Radiology;  Laterality: N/A;   TEE WITHOUT CARDIOVERSION N/A 02/05/2019   Procedure: TRANSESOPHAGEAL ECHOCARDIOGRAM (TEE);  Surgeon: Josue Hector, MD;  Location: [facility] ENDOSCOPY;  Service: Cardiovascular;  Laterality: N/A;   TOTAL HIP ARTHROPLASTY     left    There were no vitals filed for this visit.   Subjective Assessment - 06/24/21 0815     Subjective Saw Dr. Naaman Plummer who recommended Low dose  baclofen and continuation of OT for shoulder management.  Pt would like to continue with both therapies into the new year but would like to take next week off for holidays.  Still getting a little lightheaded when he gets up quick.    Patient is accompained by: Family member    Pertinent History MRI+acute infarct at the right basal ganglia. Few and  punctate bifrontal peripheral infarcts. Recent medial left thalamic infarct which may be subacute. 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    Limitations Standing;Walking    How long can you sit comfortably? >15 min    How long can you stand comfortably? <15 min    How long can you walk comfortably? <15 min    Patient Stated Goals To become more steady on his feet and return to outside ambulation    Currently in Pain? Yes    Pain Onset More than a month ago              [facility] Adult PT Treatment/Exercise - 06/24/21 0829       Transfers   Transfers Floor to Transfer    Floor to Transfer 5: Supervision    Floor to Transfer Details (indicate cue type and reason) Pt demonstrated how he gets into the  floor to stoke wood stove and then how he stands back up from tall kneeling > half kneeling with L foot forwards and then to stand with UE support.  Attempted to stand up with RLE forwards but unable even with UE support.  Returned to LLE forwards.  Provided cues to improve safety when using LUE to prevent injury.      Exercises   Exercises Other Exercises    Other Exercises  Standing on trampoline performed 2 sets x 10 reps alternating high knee marches with bilat UE support > no UE support but min A; 2 sets x 10 reps squats with bilat UE support > no UE support but with min A for balance; 2 sets x 30 reps bilat low bouncing with focus on increasing weight shift to LLE and decreasing UE support      Knee/Hip Exercises: Stretches   Active Hamstring Stretch Left;2 reps;30 seconds    Active  Hamstring Stretch Limitations Standing forward bend at step    Gastroc Stretch Left;2 reps;30 seconds;Limitations    Gastroc Stretch Limitations standing on bottom step dropping heel off edge of step      Knee/Hip Exercises: Standing   Forward Step Up Left;2 sets;10 reps;Hand Hold: 2;Hand Hold: 1;Step Height: 6"    Forward Step Up Limitations RLE tapping 2 steps up              PT Education - 06/24/21 1144     Education Details adjustment of therapy visits    Person(s) Educated Patient    Methods Explanation    Comprehension Verbalized understanding              PT Short Term Goals - 04/29/21 1104       PT SHORT TERM GOAL #1   Title Patient to become Ind in initial HEP    Baseline [place]; 04/29/21 Compliant with HEP    Time 3    Period Weeks    Status Achieved    Target Date 05/11/21      PT SHORT TERM GOAL #2   Title Patient to negotiate 16 steps with most appropriate pattern and support    Baseline 8 steps with alternating pattern and B rails; 04/25/21 16 steps with single rail and step through pattern    Time 3    Period Weeks    Status Achieved    Target Date 05/11/21      PT SHORT TERM GOAL #3   Title Patient to transfer STS 1x w/o UE support    Baseline Requires BUE support to stand; 04/25/21 Able to stand once arms crossed followed by 4 more with CGA    Time 3    Period Weeks    Status Achieved    Target Date 05/11/21      PT SHORT TERM GOAL #4   Title Patient to ambulate 540f across outdoor surfaces w/o need of AD    Baseline 2053findoors w/o need of AD; 04/29/21 Ambulation of 100036fcross paved and grassy surfaces w/o need of AD    Time 3    Period Weeks    Status Achieved    Target Date 06/01/21               PT Long Term Goals - 06/17/21 1127       PT LONG TERM GOAL #1   Title Patient to decreased 5x STS time to <12s w/o UE assist    Baseline 15.4s with BUE assist; 05/10/21 13.0s; 06/08/21  Score 13.4s    Time 4    Period Weeks     Status On-going    Target Date 07/09/21      PT LONG TERM GOAL #2   Title Patient to increase score of FGA to 26/30    Baseline 20/30; 05/10/21 FGA score 25/30; 06/08/21 FGA score decreased to 19/30    Time 4    Period Weeks    Status On-going    Target Date 07/09/21      PT LONG TERM GOAL #3   Title Patient to negotiate 16 steps with alternting pattern w/o UE support    Baseline 8 steps with alternating pattern and B UE support; 05/10/21 Able to negotiate 16 steps with single UE support    Time 4    Period Weeks    Status Partially Met    Target Date 07/09/21      PT LONG TERM GOAL #4   Title Ambulate 1020f across outdoor surfaces w/o need of AD    Baseline 2067fin clinic w/o AD; 05/10/21 120036fcross sloped and grass surfaces w/o AD under S    Time 4    Period Weeks    Status On-going    Target Date 07/09/21      PT LONG TERM GOAL #5   Title Increase FOTO score to 71    Baseline Initial score 55    Time 4    Period Weeks    Status On-going    Target Date 07/09/21                   Plan - 06/24/21 1145     Clinical Impression Statement Treatment session focused on use of functional WB activities on stairs and on unstable surface for NMR and strengthening of LLE.  Pt continues to be more reliant on UE support during L SLS activities and when support surface is more compliant.  Reviewed safe technique for floor > stand transfers and reinforced importance of having UE support but monitoring position and use of LUE due to weakness.  Will continue to address and progress towards LTG.    Personal Factors and Comorbidities Comorbidity 1    Comorbidities CVA    Examination-Activity Limitations Locomotion Level;Transfers    Stability/Clinical Decision Making Stable/Uncomplicated    Rehab Potential Good    PT Frequency 1x / week    PT Duration 4 weeks    PT Treatment/Interventions ADLs/Self Care Home Management;Aquatic Therapy;DME Instruction;Gait training;Stair  training;Functional mobility training;Therapeutic activities;Balance training;Neuromuscular re-education;Therapeutic exercise;Patient/family education    PT Next Visit Plan Check goals and recert for 1x/week x 6.  WB and use of L side to improve proprioceptive input.  Standing balance on compliant surfaces(trampoline/foam/rockerboard), EC.  higher level gait and balance training, stairs without UE support/obstacles/curb    PT Home Exercise Plan [id number]    Consulted and Agree with Plan of Care Patient             Patient will benefit from skilled therapeutic intervention in order to improve the following deficits and impairments:  Abnormal gait, Difficulty walking, Decreased endurance, Decreased activity tolerance, Decreased balance, Decreased strength  Visit Diagnosis: Unsteadiness on feet  Muscle weakness (generalized)     Problem List Patient Active Problem List   Diagnosis Date Noted   Chronic left shoulder pain 06/22/2021   Dysphagia    Leucocytosis    Benign essential HTN    Acute gout of right knee    Acute ischemic right MCA  stroke ([place]) 03/12/2021   Alcohol abuse    Seizure ([place])    Essential hypertension 12/26/2019   Longstanding persistent atrial fibrillation [facility])    Atrial fibrillation with RVR ([place]) 02/03/2019   Hyperlipidemia 02/03/2019   Anxiety 02/03/2019    Rico Junker, PT, DPT 06/24/21    11:56 AM    [facility] [address] [place], [place], [address] Phone: [phone]   Fax:  [phone]  Name: Patrick Padilla MRN: [id number] Date of Birth: 23-Oct-1955

## 2021-06-24 NOTE — Therapy (Signed)
[facility] [address] [place], [place], [address] Phone: [phone]   Fax:  [phone]  Occupational Therapy Treatment  Patient Details  Name: Patrick Padilla MRN: [id number] Date of Birth: 10-16-1955 Referring Provider (OT): [place]   Encounter Date: 06/24/2021   OT End of Session - 06/24/21 0851     Visit Number 16    Number of Visits 17    Date for OT Re-Evaluation 06/27/21    Authorization Type Medicare A&B, 20% coinsurance    Progress Note Due on Visit 109    OT Start Time 0845    OT Stop Time 0930    OT Time Calculation (min) 45 min    Activity Tolerance Patient tolerated treatment well    Behavior During Therapy WFL for tasks assessed/performed             Past Medical History:  Diagnosis Date   Anxiety    Elevated LFTs    History of colon polyps    HLD (hyperlipidemia)    HTN (hypertension)    Localized superficial swelling, mass, or lump     Past Surgical History:  Procedure Laterality Date   CARDIOVERSION N/A 02/05/2019   Procedure: CARDIOVERSION;  Surgeon: Josue Hector, MD;  Location: [place];  Service: Cardiovascular;  Laterality: N/A;   CERVICAL DISC SURGERY     IR CT HEAD LTD  03/12/2021   IR PERCUTANEOUS ART THROMBECTOMY/INFUSION INTRACRANIAL INC DIAG ANGIO  03/12/2021   IR US GUIDE VASC ACCESS RIGHT  03/15/2021   LEG SURGERY     right leg and ankle   NECK SURGERY     cyst removed   RADIOLOGY WITH ANESTHESIA N/A 03/12/2021   Procedure: RADIOLOGY WITH ANESTHESIA;  Surgeon: Radiologist, Medication, MD;  Location: [place];  Service: Radiology;  Laterality: N/A;   TEE WITHOUT CARDIOVERSION N/A 02/05/2019   Procedure: TRANSESOPHAGEAL ECHOCARDIOGRAM (TEE);  Surgeon: Josue Hector, MD;  Location: [facility] ENDOSCOPY;  Service: Cardiovascular;  Laterality: N/A;   TOTAL HIP ARTHROPLASTY     left    There were no vitals filed for this visit.   Subjective Assessment - 06/24/21 0847      Subjective  "went to the cardiologist and he extended me. They Naaman Plummer MD) put me on a muscle relaxer"    Pertinent History HTN, Afib, Anxiety, Multiple ortho injuries    Currently in Pain? No/denies    Pain Score 0-No pain    Pain Onset --                [facility] OT Assessment - 06/24/21 0919       AROM   Left Shoulder Flexion 123 Degrees   1-2/10 pain at end range                     OT Treatments/Exercises (OP) - 06/24/21 [id number]       Neurological Re-education Exercises   Other Information emphasis on scap depression and downward rotation of L scapula with manual mobilization to scap with exercises    Shoulder Flexion --   shoulder extension with dowel x10 reps   Other Exercises 1 shoulder flexion on ball on wall x 10 reps with manual scap depression and downward rotation with reach - pt responded well and tolerated icreased flexion with decrease pain.    Other Exercises 2 continued working on movement patterns with LUE And scapular mobilziations with reaching with PCVC spring and UE ranger. Pt with increased scapular mobilization  towards end of session with mobilizations and tactile cueing.                      OT Short Term Goals - 06/21/21 1200       OT SHORT TERM GOAL #1   Title Patient will complete an HEP designed to address left shoulder pain and range of motion    Time 4    Period Weeks    Status Achieved      OT SHORT TERM GOAL #2   Title Patient will demonstrate improved safety and confidence (patient report) with transferring into and out of shower.    Time 4    Period Weeks    Status Achieved      OT SHORT TERM GOAL #3   Title Patient will demonstrate improved performance - decreased time, decreased pain in donning/doffing socks    Time 4    Period Weeks    Status Achieved   pt reports this is much better 05/13/21     OT SHORT TERM GOAL #4   Title Patient will prepare simple warm meal for himself with no more than supervision     Time 4    Period Weeks    Status Achieved               OT Long Term Goals - 06/24/21 1696       OT LONG TERM GOAL #1   Title Patient will complete updated HEP designed to increase range of motion, strength and functional use of non-dominant LUE    Time 8    Period Weeks    Status On-going      OT LONG TERM GOAL #2   Title Patient will return to mowing 50% of his lawn with minimal assistance    Time 8    Period Weeks    Status Unable to assess      OT LONG TERM GOAL #3   Title Patient will return to working with hand (versus power)  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    Status On-going      OT LONG TERM GOAL #4   Title Patient will report decrease in pain with reaching patterns with LUE through mid to high reach with pain no more than 1-2/10.    Time 8    Period Weeks    Status On-going   123 degrees with 1/10 pain reported 06/24/21     OT LONG TERM GOAL #5   Title Patient will demonstrate 5 lb increase in left grip strength    Status Achieved   85.7 lbs on 06/14/21     OT LONG TERM GOAL #6   Title Patient will complete FOTO survey on discharge with at least 10 point improvement (55 UE on eval)    Time 8    Period Weeks    Status On-going                   Plan - 06/24/21 0916     Clinical Impression Statement Continues to experience pain in LUE shoulder d/t poor scapular mobilization. Pt returning in new year after scheduling conflicts. Will continue with plan of care and extend dates with recertiifcation upon return to continue working towards LUE NMR and shoulder stability.    OT Occupational Profile and History Detailed Assessment- Review of Records and additional review of physical, cognitive, psychosocial history related to current functional performance    Occupational performance deficits (  Please refer to evaluation for details): ADL's;IADL's;Leisure    Body Structure / Function / Physical Skills ADL;Decreased knowledge of use of  DME;Strength;Balance;Dexterity;GMC;Pain;Tone;Body mechanics;Hearing;UE functional use;Cardiopulmonary status limiting activity;Endurance;IADL;ROM;Vestibular;Coordination;Flexibility;Mobility;Sensation;FMC;Decreased knowledge of precautions    Rehab Potential Good    Clinical Decision Making Several treatment options, min-mod task modification necessary    Comorbidities Affecting Occupational Performance: May have comorbidities impacting occupational performance    Modification or Assistance to Complete Evaluation  Min-Moderate modification of tasks or assist with assess necessary to complete eval    OT Frequency 2x / week    OT Duration 8 weeks    OT Treatment/Interventions Self-care/ADL training;Moist Heat;Fluidtherapy;DME and/or AE instruction;Balance training;Aquatic Therapy;Therapeutic activities;Ultrasound;Therapeutic exercise;Cognitive remediation/compensation;Neuromuscular education;Functional Mobility Training;Passive range of motion;Visual/perceptual remediation/compensation;Electrical Stimulation;Manual Therapy;Patient/family education    Plan extend plan of care and complete recert when return in new year. 6 weeks    Consulted and Agree with Plan of Care Patient             Patient will benefit from skilled therapeutic intervention in order to improve the following deficits and impairments:   Body Structure / Function / Physical Skills: ADL, Decreased knowledge of use of DME, Strength, Balance, Dexterity, GMC, Pain, Tone, Body mechanics, Hearing, UE functional use, Cardiopulmonary status limiting activity, Endurance, IADL, ROM, Vestibular, Coordination, Flexibility, Mobility, Sensation, FMC, Decreased knowledge of precautions       Visit Diagnosis: Unsteadiness on feet  Muscle weakness (generalized)  Stiffness of left shoulder, not elsewhere classified  Acute pain of left shoulder  Attention and concentration deficit    Problem List Patient Active Problem List    Diagnosis Date Noted   Chronic left shoulder pain 06/22/2021   Dysphagia    Leucocytosis    Benign essential HTN    Acute gout of right knee    Acute ischemic right MCA stroke ([place]) 03/12/2021   Alcohol abuse    Seizure ([place])    Essential hypertension 12/26/2019   Longstanding persistent atrial fibrillation ([place])    Atrial fibrillation with RVR ([place]) 02/03/2019   Hyperlipidemia 02/03/2019   Anxiety 02/03/2019    Zachery Conch, OT 06/24/2021, 9:33 AM  [facility] [address] [place], [place], [address] Phone: [phone]   Fax:  [phone]  Name: Hideki Perteet MRN: [id number] Date of Birth: 04/29/1956

## 2021-06-27 ENCOUNTER — Other Ambulatory Visit: Payer: Self-pay

## 2021-06-27 NOTE — Patient Outreach (Signed)
[facility]) Care Management  06/27/2021  Patrick Padilla 1955/09/27 [id number]   Second telephone outreach attempt to obtain mRS. No answer. Unable to leave message for returned call.  Vanice Sarah [facility] Management Assistant [phone]

## 2021-06-28 ENCOUNTER — Ambulatory Visit: Payer: Medicare Other | Admitting: Occupational Therapy

## 2021-06-29 ENCOUNTER — Other Ambulatory Visit: Payer: Self-pay

## 2021-06-29 NOTE — Patient Outreach (Signed)
[facility]) Care Management  06/29/2021  Patrick Padilla 01-11-1956 [id number]   Telephone outreach to patient to obtain mRS was successfully completed. MRS= 0   Vanice Sarah [facility] Care Management Assistant

## 2021-07-01 ENCOUNTER — Ambulatory Visit: Payer: Medicare Other | Admitting: Occupational Therapy

## 2021-07-01 ENCOUNTER — Ambulatory Visit: Payer: Medicare Other | Admitting: Physical Therapy

## 2021-07-07 ENCOUNTER — Ambulatory Visit: Payer: Medicare Other

## 2021-07-10 ENCOUNTER — Other Ambulatory Visit: Payer: Self-pay | Admitting: Physical Medicine & Rehabilitation

## 2021-07-13 ENCOUNTER — Ambulatory Visit: Payer: Medicare HMO | Attending: Physician Assistant | Admitting: Occupational Therapy

## 2021-07-13 ENCOUNTER — Encounter: Payer: Self-pay | Admitting: Occupational Therapy

## 2021-07-13 ENCOUNTER — Other Ambulatory Visit: Payer: Self-pay

## 2021-07-13 DIAGNOSIS — M25612 Stiffness of left shoulder, not elsewhere classified: Secondary | ICD-10-CM

## 2021-07-13 DIAGNOSIS — M21372 Foot drop, left foot: Secondary | ICD-10-CM | POA: Insufficient documentation

## 2021-07-13 DIAGNOSIS — M6281 Muscle weakness (generalized): Secondary | ICD-10-CM | POA: Diagnosis present

## 2021-07-13 DIAGNOSIS — M25512 Pain in left shoulder: Secondary | ICD-10-CM

## 2021-07-13 DIAGNOSIS — R262 Difficulty in walking, not elsewhere classified: Secondary | ICD-10-CM | POA: Insufficient documentation

## 2021-07-13 DIAGNOSIS — R41840 Attention and concentration deficit: Secondary | ICD-10-CM

## 2021-07-13 DIAGNOSIS — R2681 Unsteadiness on feet: Secondary | ICD-10-CM | POA: Diagnosis present

## 2021-07-13 NOTE — Therapy (Signed)
[facility] [address] [place], [place], [address] Phone: [phone]   Fax:  [phone]  Occupational Therapy Treatment & Recertification  Patient Details  Name: Patrick Padilla MRN: [id number] Date of Birth: Jan 16, 1956 Referring Provider (OT): Angiulli   Encounter Date: 07/13/2021   OT End of Session - 07/13/21 0854     Visit Number 17    Number of Visits 29   17+12 at renewal   Date for OT Re-Evaluation 09/07/21    Authorization Type Medicare A&B, 20% coinsurance    Progress Note Due on Visit 27    OT Start Time 0850    OT Stop Time 0930    OT Time Calculation (min) 40 min    Activity Tolerance Patient tolerated treatment well    Behavior During Therapy WFL for tasks assessed/performed             Past Medical History:  Diagnosis Date   Anxiety    Elevated LFTs    History of colon polyps    HLD (hyperlipidemia)    HTN (hypertension)    Localized superficial swelling, mass, or lump     Past Surgical History:  Procedure Laterality Date   CARDIOVERSION N/A 02/05/2019   Procedure: CARDIOVERSION;  Surgeon: Wendall Stade, MD;  Location: [facility] ENDOSCOPY;  Service: Cardiovascular;  Laterality: N/A;   CERVICAL DISC SURGERY     IR CT HEAD LTD  03/12/2021   IR PERCUTANEOUS ART THROMBECTOMY/INFUSION INTRACRANIAL INC DIAG ANGIO  03/12/2021   IR US GUIDE VASC ACCESS RIGHT  03/15/2021   LEG SURGERY     right leg and ankle   NECK SURGERY     cyst removed   RADIOLOGY WITH ANESTHESIA N/A 03/12/2021   Procedure: RADIOLOGY WITH ANESTHESIA;  Surgeon: Radiologist, Medication, MD;  Location: [facility] OR;  Service: Radiology;  Laterality: N/A;   TEE WITHOUT CARDIOVERSION N/A 02/05/2019   Procedure: TRANSESOPHAGEAL ECHOCARDIOGRAM (TEE);  Surgeon: Wendall Stade, MD;  Location: [facility] ENDOSCOPY;  Service: Cardiovascular;  Laterality: N/A;   TOTAL HIP ARTHROPLASTY     left    There were no vitals filed for this visit.   Subjective  Assessment - 07/13/21 0853     Subjective  "My holiday was nice and quiet" Pt reports shoulder has not been too bad but has had significant pain in L hip.    Pertinent History HTN, Afib, Anxiety, Multiple ortho injuries    Currently in Pain? No/denies   pt reports no pain today but left hip has been really hurting   Pain Score 0-No pain                [facility] OT Assessment - 07/13/21 0001       AROM   Left Shoulder Flexion 135 Degrees   3/10 pain                     OT Treatments/Exercises (OP) - 07/13/21 0001       ADLs   Home Maintenance pt reports vacuuming is challenging still d/t hip pain and endurance      Shoulder Exercises: Seated   Other Seated Exercises seated and reaching with LUE to mid/high level for completing small peg board design - pt completed with no reports of increase in pain just fatigue      Shoulder Exercises: Standing   Other Standing Exercises reaching with LUE to top of mirror to place and line up 1 inch blocks with trunk rotation  OT Education - 07/13/21 0912     Education Details discussed progress towards goals and renewal - pt schedule out for 6 more weeks but may not need as much d/t progress    Person(s) Educated Patient    Methods Explanation    Comprehension Verbalized understanding              OT Short Term Goals - 06/21/21 1200       OT SHORT TERM GOAL #1   Title Patient will complete an HEP designed to address left shoulder pain and range of motion    Time 4    Period Weeks    Status Achieved      OT SHORT TERM GOAL #2   Title Patient will demonstrate improved safety and confidence (patient report) with transferring into and out of shower.    Time 4    Period Weeks    Status Achieved      OT SHORT TERM GOAL #3   Title Patient will demonstrate improved performance - decreased time, decreased pain in donning/doffing socks    Time 4    Period Weeks    Status Achieved   pt  reports this is much better 05/13/21     OT SHORT TERM GOAL #4   Title Patient will prepare simple warm meal for himself with no more than supervision    Time 4    Period Weeks    Status Achieved               OT Long Term Goals - 07/13/21 0857       OT LONG TERM GOAL #1   Title Patient will complete updated HEP designed to increase range of motion, strength and functional use of non-dominant LUE    Time 8    Period Weeks    Status On-going   continuing to address and add PRN 07/13/21     OT LONG TERM GOAL #2   Title Patient will return to mowing 50% of his lawn with minimal assistance    Time 8    Period Weeks    Status Unable to assess   d/t time of year     OT LONG TERM GOAL #3   Title Patient will return to working with hand (versus power)  tools for household maintenance or work in [employer].    Status Achieved   pt has done small jobs and tasks around the house per report 07/13/21     OT LONG TERM GOAL #4   Title Patient will report decrease in pain with reaching patterns with LUE through mid to high reach with pain no more than 1-2/10.    Time 8    Period Weeks    Status Achieved   reports no pain with mid/high level reach but with higher level (ice bucket) 4/10 07/13/21     OT LONG TERM GOAL #5   Title Patient will demonstrate 5 lb increase in left grip strength    Status Achieved   85.7 lbs on 06/14/21     OT LONG TERM GOAL #6   Title Patient will complete FOTO survey on discharge with at least 10 point improvement (55 UE on eval)    Time 8    Period Weeks    Status On-going   60 at renewal 07/13/21     OT LONG TERM GOAL #7   Title Pt will increase range of motion in LUE shoulder flexion to 140 degrees or higher for  increasing overall reach to higher levels with pain no greater than 1-2/10.    Baseline 130* at renewal    Time 8    Period Weeks    Status New    Target Date 09/07/21      OT LONG TERM GOAL #8   Title Pt will report pain no greater than 3/10 with  reaching into ice bucket at home.    Baseline pain 4/10 at renewal    Time 8    Period Weeks    Status New                   Plan - 07/13/21 0856     Clinical Impression Statement This note serves as recertification for continuing plan of care for this patient. Pt was evaluated on 04/13/21 and has attended 17 sessions. Pt has progressed with increasing independence with ADLs and IADLs and with LUE NMR and with decreased pain and increased range of motion. Pt would benefit from continued skilled OT to progress LUE range of motion and NMR and decrease overall pain and discomfort for daily activities. Added new goals to progress with LUE.    OT Occupational Profile and History Detailed Assessment- Review of Records and additional review of physical, cognitive, psychosocial history related to current functional performance    Occupational performance deficits (Please refer to evaluation for details): ADL's;IADL's;Leisure    Body Structure / Function / Physical Skills ADL;Decreased knowledge of use of DME;Strength;Balance;Dexterity;GMC;Pain;Tone;Body mechanics;Hearing;UE functional use;Cardiopulmonary status limiting activity;Endurance;IADL;ROM;Vestibular;Coordination;Flexibility;Mobility;Sensation;FMC;Decreased knowledge of precautions    Rehab Potential Good    Clinical Decision Making Several treatment options, min-mod task modification necessary    Comorbidities Affecting Occupational Performance: May have comorbidities impacting occupational performance    Modification or Assistance to Complete Evaluation  Min-Moderate modification of tasks or assist with assess necessary to complete eval    OT Frequency 2x / week    OT Duration 8 weeks   12 visits over 8 additional weeks at renewal   OT Treatment/Interventions Self-care/ADL training;Moist Heat;Fluidtherapy;DME and/or AE instruction;Balance training;Aquatic Therapy;Therapeutic activities;Ultrasound;Therapeutic exercise;Cognitive  remediation/compensation;Neuromuscular education;Functional Mobility Training;Passive range of motion;Visual/perceptual remediation/compensation;Electrical Stimulation;Manual Therapy;Patient/family education    Plan renewal completed for 12 visits over 8 weeks d/t any scheduling conflicts, continue with higher level reach and mobility with LUE    Consulted and Agree with Plan of Care Patient             Patient will benefit from skilled therapeutic intervention in order to improve the following deficits and impairments:   Body Structure / Function / Physical Skills: ADL, Decreased knowledge of use of DME, Strength, Balance, Dexterity, GMC, Pain, Tone, Body mechanics, Hearing, UE functional use, Cardiopulmonary status limiting activity, Endurance, IADL, ROM, Vestibular, Coordination, Flexibility, Mobility, Sensation, FMC, Decreased knowledge of precautions       Visit Diagnosis: Unsteadiness on feet  Muscle weakness (generalized)  Stiffness of left shoulder, not elsewhere classified  Acute pain of left shoulder  Attention and concentration deficit    Problem List Patient Active Problem List   Diagnosis Date Noted   Chronic left shoulder pain 06/22/2021   Dysphagia    Leucocytosis    Benign essential HTN    Acute gout of right knee    Acute ischemic right MCA stroke (HCC) 03/12/2021   Alcohol abuse    Seizure (HCC)    Essential hypertension 12/26/2019   Longstanding persistent atrial fibrillation (HCC)    Atrial fibrillation with RVR (HCC) 02/03/2019   Hyperlipidemia 02/03/2019   Anxiety  02/03/2019    Junious Dresser, OT 07/13/2021, 9:27 AM  [facility] [address] [place], [place], [address] Phone: [phone]   Fax:  [phone]  Name: Aleks Nawrot MRN: [id number] Date of Birth: [birth year]

## 2021-07-15 ENCOUNTER — Other Ambulatory Visit: Payer: Self-pay

## 2021-07-15 ENCOUNTER — Encounter: Payer: Self-pay | Admitting: Occupational Therapy

## 2021-07-15 ENCOUNTER — Ambulatory Visit: Payer: Medicare HMO | Admitting: Physical Therapy

## 2021-07-15 ENCOUNTER — Ambulatory Visit: Payer: Medicare HMO | Admitting: Occupational Therapy

## 2021-07-15 DIAGNOSIS — M25512 Pain in left shoulder: Secondary | ICD-10-CM

## 2021-07-15 DIAGNOSIS — M21372 Foot drop, left foot: Secondary | ICD-10-CM

## 2021-07-15 DIAGNOSIS — R2681 Unsteadiness on feet: Secondary | ICD-10-CM

## 2021-07-15 DIAGNOSIS — M6281 Muscle weakness (generalized): Secondary | ICD-10-CM

## 2021-07-15 DIAGNOSIS — M25612 Stiffness of left shoulder, not elsewhere classified: Secondary | ICD-10-CM

## 2021-07-15 DIAGNOSIS — R41840 Attention and concentration deficit: Secondary | ICD-10-CM | POA: Diagnosis not present

## 2021-07-15 DIAGNOSIS — R262 Difficulty in walking, not elsewhere classified: Secondary | ICD-10-CM | POA: Diagnosis not present

## 2021-07-15 NOTE — Therapy (Signed)
[facility] [address] [place], [place], [address] Phone: [phone]   Fax:  [phone]  Occupational Therapy Treatment  Patient Details  Name: Patrick Padilla MRN: [id number] Date of Birth: 1955-07-22 Referring Provider (OT): Angiulli   Encounter Date: 07/15/2021   OT End of Session - 07/15/21 0842     Visit Number 18    Number of Visits 29   17+12 at renewal   Date for OT Re-Evaluation 09/07/21    Authorization Type Medicare A&B, 20% coinsurance    Progress Note Due on Visit 27    OT Start Time 0840    OT Stop Time 0925    OT Time Calculation (min) 45 min    Activity Tolerance Patient tolerated treatment well    Behavior During Therapy WFL for tasks assessed/performed             Past Medical History:  Diagnosis Date   Anxiety    Elevated LFTs    History of colon polyps    HLD (hyperlipidemia)    HTN (hypertension)    Localized superficial swelling, mass, or lump     Past Surgical History:  Procedure Laterality Date   CARDIOVERSION N/A 02/05/2019   Procedure: CARDIOVERSION;  Surgeon: Wendall Stade, MD;  Location: [facility] ENDOSCOPY;  Service: Cardiovascular;  Laterality: N/A;   CERVICAL DISC SURGERY     IR CT HEAD LTD  03/12/2021   IR PERCUTANEOUS ART THROMBECTOMY/INFUSION INTRACRANIAL INC DIAG ANGIO  03/12/2021   IR US GUIDE VASC ACCESS RIGHT  03/15/2021   LEG SURGERY     right leg and ankle   NECK SURGERY     cyst removed   RADIOLOGY WITH ANESTHESIA N/A 03/12/2021   Procedure: RADIOLOGY WITH ANESTHESIA;  Surgeon: Radiologist, Medication, MD;  Location: [facility] OR;  Service: Radiology;  Laterality: N/A;   TEE WITHOUT CARDIOVERSION N/A 02/05/2019   Procedure: TRANSESOPHAGEAL ECHOCARDIOGRAM (TEE);  Surgeon: Wendall Stade, MD;  Location: [facility] ENDOSCOPY;  Service: Cardiovascular;  Laterality: N/A;   TOTAL HIP ARTHROPLASTY     left    There were no vitals filed for this visit.   Subjective Assessment - 07/15/21  0841     Subjective  "I feel pretty good"    Pertinent History HTN, Afib, Anxiety, Multiple ortho injuries    Currently in Pain? No/denies    Pain Score 0-No pain             Spring PVC with LUE 15 reps scap retraction and lat pull down.   Resitsance Clothespins with 1-8# with LUE for shoulder endurance and reaching to higher levels.   Reaching while standing in corner (facing) reaching with LUE to elevated surface on wall to left and right x 10 reps. Pt with no report of increased pain. Reaching to left for abduction of LUE x 10 reps.   Physioball with LUE abducted x 10 reps on mat, forward reaching x 10 reps.   Prone on forearms with scap stabilizations and strengthening while completing Perfection board.   Prone Scap with arms extended by body and for shoulder extension x 10 reps - "I", "T" exercises x 10  Arm Bike: for conditioning and reciprocal movements on Level 1 for 6 minutes (forward and backward)                   OT Short Term Goals - 06/21/21 1200       OT SHORT TERM GOAL #1   Title Patient will  complete an HEP designed to address left shoulder pain and range of motion    Time 4    Period Weeks    Status Achieved      OT SHORT TERM GOAL #2   Title Patient will demonstrate improved safety and confidence (patient report) with transferring into and out of shower.    Time 4    Period Weeks    Status Achieved      OT SHORT TERM GOAL #3   Title Patient will demonstrate improved performance - decreased time, decreased pain in donning/doffing socks    Time 4    Period Weeks    Status Achieved   pt reports this is much better 05/13/21     OT SHORT TERM GOAL #4   Title Patient will prepare simple warm meal for himself with no more than supervision    Time 4    Period Weeks    Status Achieved               OT Long Term Goals - 07/13/21 0857       OT LONG TERM GOAL #1   Title Patient will complete updated HEP designed to increase range  of motion, strength and functional use of non-dominant LUE    Time 8    Period Weeks    Status On-going   continuing to address and add PRN 07/13/21     OT LONG TERM GOAL #2   Title Patient will return to mowing 50% of his lawn with minimal assistance    Time 8    Period Weeks    Status Unable to assess   d/t time of year     OT LONG TERM GOAL #3   Title Patient will return to working with hand (versus power)  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    Status Achieved   pt has done small jobs and tasks around the house per report 07/13/21     OT LONG TERM GOAL #4   Title Patient will report decrease in pain with reaching patterns with LUE through mid to high reach with pain no more than 1-2/10.    Time 8    Period Weeks    Status Achieved   reports no pain with mid/high level reach but with higher level (ice bucket) 4/10 07/13/21     OT LONG TERM GOAL #5   Title Patient will demonstrate 5 lb increase in left grip strength    Status Achieved   85.7 lbs on 06/14/21     OT LONG TERM GOAL #6   Title Patient will complete FOTO survey on discharge with at least 10 point improvement (55 UE on eval)    Time 8    Period Weeks    Status On-going   60 at renewal 07/13/21     OT LONG TERM GOAL #7   Title Pt will increase range of motion in LUE shoulder flexion to 140 degrees or higher for increasing overall reach to higher levels with pain no greater than 1-2/10.    Baseline 130* at renewal    Time 8    Period Weeks    Status New    Target Date 09/07/21      OT LONG TERM GOAL #8   Title Pt will report pain no greater than 3/10 with reaching into ice bucket at home.    Baseline pain 4/10 at renewal    Time 8    Period Weeks  Status New                   Plan - 07/15/21 0920     Clinical Impression Statement Pt progressing with goals and increased functional use of LUE. Anticipate discharge prior to end of scheduled visits.    OT Occupational Profile and History  Detailed Assessment- Review of Records and additional review of physical, cognitive, psychosocial history related to current functional performance    Occupational performance deficits (Please refer to evaluation for details): ADL's;IADL's;Leisure    Body Structure / Function / Physical Skills ADL;Decreased knowledge of use of DME;Strength;Balance;Dexterity;GMC;Pain;Tone;Body mechanics;Hearing;UE functional use;Cardiopulmonary status limiting activity;Endurance;IADL;ROM;Vestibular;Coordination;Flexibility;Mobility;Sensation;FMC;Decreased knowledge of precautions    Rehab Potential Good    Clinical Decision Making Several treatment options, min-mod task modification necessary    Comorbidities Affecting Occupational Performance: May have comorbidities impacting occupational performance    Modification or Assistance to Complete Evaluation  Min-Moderate modification of tasks or assist with assess necessary to complete eval    OT Frequency 2x / week    OT Duration 8 weeks   12 visits over 8 additional weeks at renewal   OT Treatment/Interventions Self-care/ADL training;Moist Heat;Fluidtherapy;DME and/or AE instruction;Balance training;Aquatic Therapy;Therapeutic activities;Ultrasound;Therapeutic exercise;Cognitive remediation/compensation;Neuromuscular education;Functional Mobility Training;Passive range of motion;Visual/perceptual remediation/compensation;Electrical Stimulation;Manual Therapy;Patient/family education    Plan continue with higher level reach and mobility with LUE    Consulted and Agree with Plan of Care Patient             Patient will benefit from skilled therapeutic intervention in order to improve the following deficits and impairments:   Body Structure / Function / Physical Skills: ADL, Decreased knowledge of use of DME, Strength, Balance, Dexterity, GMC, Pain, Tone, Body mechanics, Hearing, UE functional use, Cardiopulmonary status limiting activity, Endurance, IADL, ROM,  Vestibular, Coordination, Flexibility, Mobility, Sensation, FMC, Decreased knowledge of precautions       Visit Diagnosis: Unsteadiness on feet  Muscle weakness (generalized)  Stiffness of left shoulder, not elsewhere classified  Acute pain of left shoulder  Attention and concentration deficit    Problem List Patient Active Problem List   Diagnosis Date Noted   Chronic left shoulder pain 06/22/2021   Dysphagia    Leucocytosis    Benign essential HTN    Acute gout of right knee    Acute ischemic right MCA stroke (HCC) 03/12/2021   Alcohol abuse    Seizure (HCC)    Essential hypertension 12/26/2019   Longstanding persistent atrial fibrillation (HCC)    Atrial fibrillation with RVR (HCC) 02/03/2019   Hyperlipidemia 02/03/2019   Anxiety 02/03/2019    Junious DresserKirstyn M Crysten Kaman, OT 07/15/2021, 9:20 AM  [facility] [address] [place], [place], [address] Phone: [phone]   Fax:  [phone]  Name: Patrick BucklesJoel Padilla MRN: [id number] Date of Birth: 08-23-1955

## 2021-07-15 NOTE — Therapy (Addendum)
[facility] [address] [place], [place], [address] Phone: [phone]   Fax:  [phone]  Physical Therapy Treatment  Patient Details  Name: Patrick Padilla MRN: [id number] Date of Birth: [birth year] Referring Provider (PT): Cathlyn Parsons, PA-C   Encounter Date: 07/15/2021   PT End of Session - 07/15/21 0841     Visit Number 16    Number of Visits 22    Date for PT Re-Evaluation 08/29/21    Authorization Type MCR/MCD    Progress Note Due on Visit 20    PT Start Time 0802    PT Stop Time 0840    PT Time Calculation (min) 38 min    Activity Tolerance Patient tolerated treatment well    Behavior During Therapy [facility] for tasks assessed/performed             Past Medical History:  Diagnosis Date   Anxiety    Elevated LFTs    History of colon polyps    HLD (hyperlipidemia)    HTN (hypertension)    Localized superficial swelling, mass, or lump     Past Surgical History:  Procedure Laterality Date   CARDIOVERSION N/A 02/05/2019   Procedure: CARDIOVERSION;  Surgeon: Josue Hector, MD;  Location: [facility];  Service: Cardiovascular;  Laterality: N/A;   CERVICAL DISC SURGERY     IR CT HEAD LTD  03/12/2021   IR PERCUTANEOUS ART THROMBECTOMY/INFUSION INTRACRANIAL INC DIAG ANGIO  03/12/2021   IR US GUIDE VASC ACCESS RIGHT  03/15/2021   LEG SURGERY     right leg and ankle   NECK SURGERY     cyst removed   RADIOLOGY WITH ANESTHESIA N/A 03/12/2021   Procedure: RADIOLOGY WITH ANESTHESIA;  Surgeon: Radiologist, Medication, MD;  Location: [facility];  Service: Radiology;  Laterality: N/A;   TEE WITHOUT CARDIOVERSION N/A 02/05/2019   Procedure: TRANSESOPHAGEAL ECHOCARDIOGRAM (TEE);  Surgeon: Josue Hector, MD;  Location: [facility] ENDOSCOPY;  Service: Cardiovascular;  Laterality: N/A;   TOTAL HIP ARTHROPLASTY     left    There were no vitals filed for this visit.   Subjective Assessment - 07/15/21 0805     Subjective Did a lot  over the holidays, lots of shopping and walking - caused fatigue in L hip, had to sit more.  Shoulder is better.  Lightheadedness is better.    Patient is accompained by: Family member    Pertinent History MRI+acute infarct at the right basal ganglia. Few and  punctate bifrontal peripheral infarcts. Recent medial left thalamic infarct which may be subacute. 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  L THA.    Limitations Standing;Walking    How long can you sit comfortably? >15 min    How long can you stand comfortably? <15 min    How long can you walk comfortably? <15 min    Patient Stated Goals To become more steady on his feet and return to outside ambulation    Currently in Pain? No/denies    Pain Onset More than a month ago              07/15/21 0808  Assessment  Medical Diagnosis I63.511 (ICD-10-CM) - Cerebral infarction due to unspecified occlusion or stenosis of right middle cerebral artery  Referring Provider (PT) Cathlyn Parsons, PA-C  Onset Date/Surgical Date 03/12/21  Hand Dominance Right  Next MD Visit 05/16/21  Prior Therapy CIR  Precautions  Precautions Other (comment)  Precaution Comments No  driving, no smoking, no drinking, no heavy lifting  Observation/Other Assessments  Focus on Therapeutic Outcomes (FOTO)  LE Stroke: 53, decreased from 61  Ambulation/Gait  Stairs Yes  Stairs Assistance 4: Min guard;5: Supervision  Stairs Assistance Details (indicate cue type and reason) alternating sequence without rails, intermittent LOB posterior when descending with LLE  Stair Management Technique No rails;Alternating pattern;Forwards  Number of Stairs 12  Height of Stairs 6  Standardized Balance Assessment  Standardized Balance Assessment Five Times Sit to Stand  Five times sit to stand comments  14.37 seconds, from mat, no UE support  Functional Gait  Assessment  Gait assessed  Yes  Gait Level Surface 2   Change in Gait Speed 2  Gait with Horizontal Head Turns 2  Gait with Vertical Head Turns 2  Gait and Pivot Turn 3  Step Over Obstacle 3  Gait with Narrow Base of Support 2  Gait with Eyes Closed 1  Ambulating Backwards 2  Steps 2  Total Score 21  FGA comment: 21/30 no dizziness, pt reports feeling more stable        PT Education - 07/17/21 1635     Education Details progress towards goals, areas to continue to focus on    Person(s) Educated Patient    Methods Explanation    Comprehension Verbalized understanding              PT Short Term Goals - 04/29/21 1104       PT SHORT TERM GOAL #1   Title Patient to become Ind in initial HEP    Baseline [place]; 04/29/21 Compliant with HEP    Time 3    Period Weeks    Status Achieved    Target Date 05/11/21      PT SHORT TERM GOAL #2   Title Patient to negotiate 16 steps with most appropriate pattern and support    Baseline 8 steps with alternating pattern and B rails; 04/25/21 16 steps with single rail and step through pattern    Time 3    Period Weeks    Status Achieved    Target Date 05/11/21      PT SHORT TERM GOAL #3   Title Patient to transfer STS 1x w/o UE support    Baseline Requires BUE support to stand; 04/25/21 Able to stand once arms crossed followed by 4 more with CGA    Time 3    Period Weeks    Status Achieved    Target Date 05/11/21      PT SHORT TERM GOAL #4   Title Patient to ambulate 570f across outdoor surfaces w/o need of AD    Baseline 2046findoors w/o need of AD; 04/29/21 Ambulation of 100011fcross paved and grassy surfaces w/o need of AD    Time 3    Period Weeks    Status Achieved    Target Date 06/01/21               PT Long Term Goals - 07/15/21 0835       PT LONG TERM GOAL #1   Title Patient to decreased 5x STS time to <12s w/o UE assist    Baseline 14 seconds without UE support    Time 4    Period Weeks    Status Partially Met    Target Date 07/09/21      PT  LONG TERM GOAL #2   Title Patient to increase score of FGA to 26/30  Baseline 21/30 without AD   ° Time 4   ° Period Weeks   ° Status Partially Met   ° Target Date 07/09/21   °  ° PT LONG TERM GOAL #3  ° Title Patient to negotiate 16 steps with alternting pattern w/o UE support   ° Baseline 12 stairs with intermittent rail for support when descending   ° Time 4   ° Period Weeks   ° Status Partially Met   ° Target Date 07/09/21   °  ° PT LONG TERM GOAL #4  ° Title Ambulate 1000ft across outdoor surfaces w/o need of AD   ° Baseline is doing at home   ° Time 4   ° Period Weeks   ° Status Achieved   ° Target Date 07/09/21   °  ° PT LONG TERM GOAL #5  ° Title Increase FOTO score to 71   ° Baseline Initial score 55 > 53   ° Time 4   ° Period Weeks   ° Status Not Met   ° Target Date 07/09/21   ° °  °  ° °  ° °New goals for Re-certification: ° PT Short Term Goals - 07/17/21 1644   ° °  ° PT SHORT TERM GOAL #1  ° Title Pt will demonstrate independence with updated HEP   ° Baseline [id number]   ° Time 3   ° Period Weeks   ° Status Revised   ° Target Date 08/07/21   °  ° PT SHORT TERM GOAL #2  ° Title Patient to negotiate 16 stairs without rails, alternating sequence, min guard   ° Time 3   ° Period Weeks   ° Status Revised   ° Target Date 08/07/21   °  ° PT SHORT TERM GOAL #3  ° Title Pt will decrease five time sit to stand time to </= 13 seconds without use of UE   ° Baseline 14 seconds   ° Time 3   ° Period Weeks   ° Status Revised   ° Target Date 08/07/21   °  ° PT SHORT TERM GOAL #4  ° Title Patient will demonstrate ability to transfer from floor <> stand with one UE support, MOD I   ° Time 3   ° Period Weeks   ° Status Revised   ° Target Date 08/07/21   °  ° PT SHORT TERM GOAL #5  ° Title Pt will increase FGA score to >/= 24/30   ° Baseline 21/30   ° Time 3   ° Period Weeks   ° Status New   ° Target Date 08/07/21   ° °  °  ° °  ° ° PT Long Term Goals - 07/17/21 1647   ° °  ° PT LONG TERM GOAL #1  ° Title Patient to  decreased 5x STS time to <12s w/o UE assist   ° Time 6   ° Period Weeks   ° Status Revised   ° Target Date 08/31/21   °  ° PT LONG TERM GOAL #2  ° Title Patient to increase score of FGA to 27/30   ° Time 6   ° Period Weeks   ° Status Revised   ° Target Date 08/31/21   °  ° PT LONG TERM GOAL #3  ° Title Patient to negotiate 16 steps with alternting pattern w/o UE support, MOD I   ° Time 6   ° Period Weeks   °   Status Revised    Target Date 08/31/21      PT LONG TERM GOAL #4   Title Pt will demonstrate ability to transition floor <> standing, MOD I without use of UE on furniture    Time 6    Period Weeks    Status Revised    Target Date 08/31/21      PT LONG TERM GOAL #5   Title Increase FOTO score to 71    Baseline Initial score 55 > 53    Time 6    Period Weeks    Status Revised    Target Date 08/31/21              07/15/21 0841  Plan  Clinical Impression Statement Pt is making steady progress towards LTG and has met outdoor ambulation goal.  Pt has partially met and is making progress towards five time sit to stand, FGA, and stair goal.  Pt did not meet FOTO goal despite pt reporting increased overall function at home.  Pt continues to have a goal to be able to transition floor <> standing without use of UE support in ordre to safely operate wood stove at home.  Pt continues to present with the following impairments: decreased cardiopulmonary endurance, impaired LLE strength, impaired standing balance, impaired gait with L foot slap.  Pt will benefit from continued skilled PT services to address ongoing impairments and unmet goals to maximize functional mobility independence and decrease falls risk.  Comorbidities CVA, L THA  Pt will benefit from skilled therapeutic intervention in order to improve on the following deficits Abnormal gait;Difficulty walking;Decreased endurance;Decreased activity tolerance;Decreased balance;Decreased strength  Rehab Potential Good  PT Frequency 1x / week   PT Duration 6 weeks  PT Treatment/Interventions ADLs/Self Care Home Management;Aquatic Therapy;Gait training;Stair training;Functional mobility training;Therapeutic activities;Balance training;Neuromuscular re-education;Therapeutic exercise;Patient/family education;Cryotherapy;Moist Heat;Orthotic Fit/Training  PT Next Visit Plan Progress HEP.  WB and use of L side to improve proprioceptive input.  SLS L side; descending stairs with L, Standing balance on compliant surfaces (trampoline/foam/rockerboard), EC.  higher level gait and balance training, stairs without UE support/obstacles/curb.  Getting up off the floor without UE support  PT Home Exercise Plan [facility]  Consulted and Agree with Plan of Care Patient     Patient will benefit from skilled therapeutic intervention in order to improve the following deficits and impairments:  Abnormal gait, Difficulty walking, Decreased endurance, Decreased activity tolerance, Decreased balance, Decreased strength  Visit Diagnosis: Unsteadiness on feet  Muscle weakness (generalized)  Difficulty in walking, not elsewhere classified  Foot drop, left     Problem List Patient Active Problem List   Diagnosis Date Noted   Chronic left shoulder pain 06/22/2021   Dysphagia    Leucocytosis    Benign essential HTN    Acute gout of right knee    Acute ischemic right MCA stroke ([place]) 03/12/2021   Alcohol abuse    Seizure ([place])    Essential hypertension 12/26/2019   Longstanding persistent atrial fibrillation ([place])    Atrial fibrillation with RVR ([place]) 02/03/2019   Hyperlipidemia 02/03/2019   Anxiety 02/03/2019   Patrick Padilla, PT, DPT 07/17/21    4:49 PM    [facility] [address] [place], [place], [address] Phone: [phone]   Fax:  [phone]  Name: Patrick Padilla MRN: [id number] Date of Birth: [birth year]

## 2021-07-18 ENCOUNTER — Other Ambulatory Visit: Payer: Self-pay | Admitting: Physical Medicine & Rehabilitation

## 2021-07-20 ENCOUNTER — Ambulatory Visit: Payer: Medicare HMO | Admitting: Occupational Therapy

## 2021-07-22 ENCOUNTER — Ambulatory Visit: Payer: Medicare HMO | Admitting: Physical Therapy

## 2021-07-22 ENCOUNTER — Ambulatory Visit: Payer: Medicare HMO | Admitting: Occupational Therapy

## 2021-07-22 ENCOUNTER — Other Ambulatory Visit: Payer: Self-pay

## 2021-07-22 ENCOUNTER — Encounter: Payer: Self-pay | Admitting: Occupational Therapy

## 2021-07-22 DIAGNOSIS — M21372 Foot drop, left foot: Secondary | ICD-10-CM

## 2021-07-22 DIAGNOSIS — M6281 Muscle weakness (generalized): Secondary | ICD-10-CM | POA: Diagnosis not present

## 2021-07-22 DIAGNOSIS — R262 Difficulty in walking, not elsewhere classified: Secondary | ICD-10-CM

## 2021-07-22 DIAGNOSIS — R2681 Unsteadiness on feet: Secondary | ICD-10-CM | POA: Diagnosis not present

## 2021-07-22 DIAGNOSIS — R41840 Attention and concentration deficit: Secondary | ICD-10-CM

## 2021-07-22 DIAGNOSIS — M25612 Stiffness of left shoulder, not elsewhere classified: Secondary | ICD-10-CM

## 2021-07-22 DIAGNOSIS — M25512 Pain in left shoulder: Secondary | ICD-10-CM | POA: Diagnosis not present

## 2021-07-22 NOTE — Therapy (Signed)
[facility] [address] [place], [place], [address] Phone: [phone]   Fax:  [phone]  Occupational Therapy Treatment & Discharge  Patient Details  Name: Patrick Padilla MRN: [id number] Date of Birth: 1955/10/11 Referring Provider (OT): Victor   Encounter Date: 07/22/2021  OCCUPATIONAL THERAPY DISCHARGE SUMMARY  Visits from Start of Care: 19  Current functional level related to goals / functional outcomes: Pt with much improvement with shoulder pain in LUE and increased function and independence with ADLs and IADLs.    Remaining deficits: Pt with some limitations with range of motion in LUE shoulder but with much improvement and increased function.   Education / Equipment: HEP for shoulder range of motion   Patient agrees to discharge. Patient goals were partially met. Patient is being discharged due to meeting the stated rehab goals..      OT End of Session - 07/22/21 0807     Visit Number 19    Number of Visits 29   17+12 at renewal   Date for OT Re-Evaluation 09/07/21    Authorization Type Medicare A&B, 20% coinsurance    Progress Note Due on Visit 29    OT Start Time 0803    OT Stop Time 0832   d/c session   OT Time Calculation (min) 29 min    Activity Tolerance Patient tolerated treatment well    Behavior During Therapy WFL for tasks assessed/performed             Past Medical History:  Diagnosis Date   Anxiety    Elevated LFTs    History of colon polyps    HLD (hyperlipidemia)    HTN (hypertension)    Localized superficial swelling, mass, or lump     Past Surgical History:  Procedure Laterality Date   CARDIOVERSION N/A 02/05/2019   Procedure: CARDIOVERSION;  Surgeon: Josue Hector, MD;  Location: [facility];  Service: Cardiovascular;  Laterality: N/A;   CERVICAL DISC SURGERY     IR CT HEAD LTD  03/12/2021   IR PERCUTANEOUS ART THROMBECTOMY/INFUSION INTRACRANIAL INC DIAG ANGIO   03/12/2021   IR US GUIDE VASC ACCESS RIGHT  03/15/2021   LEG SURGERY     right leg and ankle   NECK SURGERY     cyst removed   RADIOLOGY WITH ANESTHESIA N/A 03/12/2021   Procedure: RADIOLOGY WITH ANESTHESIA;  Surgeon: Radiologist, Medication, MD;  Location: [facility];  Service: Radiology;  Laterality: N/A;   TEE WITHOUT CARDIOVERSION N/A 02/05/2019   Procedure: TRANSESOPHAGEAL ECHOCARDIOGRAM (TEE);  Surgeon: Josue Hector, MD;  Location: [facility] ENDOSCOPY;  Service: Cardiovascular;  Laterality: N/A;   TOTAL HIP ARTHROPLASTY     left    There were no vitals filed for this visit.   Subjective Assessment - 07/22/21 0805     Subjective  "Apparently I goofed up Tuesday"    Pertinent History HTN, Afib, Anxiety, Multiple ortho injuries    Currently in Pain? Yes    Pain Score 5     Pain Location Hip    Pain Orientation Left    Pain Descriptors / Indicators Aching;Discomfort;Numbness    Pain Type Neuropathic pain;Acute pain    Pain Onset 1 to 4 weeks ago    Pain Frequency Constant   positional and when walking   Aggravating Factors  walking    Pain Relieving Factors resting - recliner  OT Short Term Goals - 06/21/21 1200       OT SHORT TERM GOAL #1   Title Patient will complete an HEP designed to address left shoulder pain and range of motion    Time 4    Period Weeks    Status Achieved      OT SHORT TERM GOAL #2   Title Patient will demonstrate improved safety and confidence (patient report) with transferring into and out of shower.    Time 4    Period Weeks    Status Achieved      OT SHORT TERM GOAL #3   Title Patient will demonstrate improved performance - decreased time, decreased pain in donning/doffing socks    Time 4    Period Weeks    Status Achieved   pt reports this is much better 05/13/21     OT SHORT TERM GOAL #4   Title Patient will prepare simple warm meal for himself with no more than supervision    Time 4     Period Weeks    Status Achieved               OT Long Term Goals - 07/22/21 0809       OT LONG TERM GOAL #1   Title Patient will complete updated HEP designed to increase range of motion, strength and functional use of non-dominant LUE    Time 8    Period Weeks    Status Achieved   continuing to address and add PRN 07/13/21     OT LONG TERM GOAL #2   Title Patient will return to mowing 50% of his lawn with minimal assistance    Time 8    Period Weeks    Status Unable to assess   d/t time of year     OT LONG TERM GOAL #3   Title Patient will return to working with hand (versus power)  tools for household maintenance or work in [employer].    Status Achieved   pt has done small jobs and tasks around the house per report 07/13/21     OT LONG TERM GOAL #4   Title Patient will report decrease in pain with reaching patterns with LUE through mid to high reach with pain no more than 1-2/10.    Time 8    Period Weeks    Status Achieved   reports no pain with mid/high level reach but with higher level (ice bucket) 4/10 07/13/21     OT LONG TERM GOAL #5   Title Patient will demonstrate 5 lb increase in left grip strength    Status Achieved   85.7 lbs on 06/14/21     OT LONG TERM GOAL #6   Title Patient will complete FOTO survey on discharge with at least 10 point improvement (55 UE on eval)    Time 8    Period Weeks    Status Not Met   60.5 07/22/2021     OT LONG TERM GOAL #7   Title Pt will increase range of motion in LUE shoulder flexion to 140 degrees or higher for increasing overall reach to higher levels with pain no greater than 1-2/10.    Baseline 130* at renewal    Time 8    Period Weeks    Status Achieved   140* on 07/22/21   Target Date 09/07/21      OT LONG TERM GOAL #8   Title Pt will report pain no greater than  3/10 with reaching into ice bucket at home.    Baseline pain 4/10 at renewal    Time 8    Period Weeks    Status Achieved   pt reports no pain recently while  doing this task                  Plan - 07/22/21 0825     Clinical Impression Statement Reviewed HEPs for discharge. Pt met all goals with exception of FOTO. Ready for discharge at this time.    OT Occupational Profile and History Detailed Assessment- Review of Records and additional review of physical, cognitive, psychosocial history related to current functional performance    Occupational performance deficits (Please refer to evaluation for details): ADL's;IADL's;Leisure    Body Structure / Function / Physical Skills ADL;Decreased knowledge of use of DME;Strength;Balance;Dexterity;GMC;Pain;Tone;Body mechanics;Hearing;UE functional use;Cardiopulmonary status limiting activity;Endurance;IADL;ROM;Vestibular;Coordination;Flexibility;Mobility;Sensation;FMC;Decreased knowledge of precautions    Rehab Potential Good    Clinical Decision Making Several treatment options, min-mod task modification necessary    Comorbidities Affecting Occupational Performance: May have comorbidities impacting occupational performance    Modification or Assistance to Complete Evaluation  Min-Moderate modification of tasks or assist with assess necessary to complete eval    OT Frequency 2x / week    OT Duration 8 weeks   12 visits over 8 additional weeks at renewal   OT Treatment/Interventions Self-care/ADL training;Moist Heat;Fluidtherapy;DME and/or AE instruction;Balance training;Aquatic Therapy;Therapeutic activities;Ultrasound;Therapeutic exercise;Cognitive remediation/compensation;Neuromuscular education;Functional Mobility Training;Passive range of motion;Visual/perceptual remediation/compensation;Electrical Stimulation;Manual Therapy;Patient/family education    Plan discharge OT    Consulted and Agree with Plan of Care Patient             Patient will benefit from skilled therapeutic intervention in order to improve the following deficits and impairments:   Body Structure / Function / Physical  Skills: ADL, Decreased knowledge of use of DME, Strength, Balance, Dexterity, GMC, Pain, Tone, Body mechanics, Hearing, UE functional use, Cardiopulmonary status limiting activity, Endurance, IADL, ROM, Vestibular, Coordination, Flexibility, Mobility, Sensation, FMC, Decreased knowledge of precautions       Visit Diagnosis: Unsteadiness on feet  Muscle weakness (generalized)  Stiffness of left shoulder, not elsewhere classified  Acute pain of left shoulder  Attention and concentration deficit  Difficulty in walking, not elsewhere classified    Problem List Patient Active Problem List   Diagnosis Date Noted   Chronic left shoulder pain 06/22/2021   Dysphagia    Leucocytosis    Benign essential HTN    Acute gout of right knee    Acute ischemic right MCA stroke ([place]) 03/12/2021   Alcohol abuse    Seizure ([place])    Essential hypertension 12/26/2019   Longstanding persistent atrial fibrillation ([place])    Atrial fibrillation with RVR ([place]) 02/03/2019   Hyperlipidemia 02/03/2019   Anxiety 02/03/2019    Zachery Conch, OT 07/22/2021, 8:36 AM  [facility] [address] [place], [place], [address] Phone: [phone]   Fax:  [phone]  Name: Patrick Padilla MRN: [id number] Date of Birth: 1955-11-25

## 2021-07-22 NOTE — Patient Instructions (Addendum)
Access Code: [facility] URL: [email or web address] Date: 07/22/2021 Prepared by: Bufford Lope  Exercises Seated Hamstring Stretch - 1-2 x daily - 5 x weekly - 2 sets - 10 reps - 30-40min hold Tandem Walking with Counter Support - 2 x daily - 7 x weekly - 2 sets - 10 reps SIT TO STAND ON A CUSHION/PILLOW - 1 x daily - 7 x weekly - 2 sets - 8 reps Romberg Stance with Head Rotation - 1 x daily - 7 x weekly - 1 sets - 8 reps Romberg Stance with Head Nods - 1 x daily - 7 x weekly - 1 sets - 8 reps Single Leg Stance with Support - 1 x daily - 7 x weekly - 2 sets - 2 reps - 10 second hold

## 2021-07-22 NOTE — Therapy (Signed)
[facility] [address] [place], [place], [address] Phone: [phone]   Fax:  [phone]  Physical Therapy Treatment  Patient Details  Name: Patrick Padilla MRN: [id number] Date of Birth: 02/15/1956 Referring Provider (PT): Cathlyn Parsons, PA-C   Encounter Date: 07/22/2021   PT End of Session - 07/22/21 1230     Visit Number 17    Number of Visits 22    Date for PT Re-Evaluation 08/29/21    Authorization Type MCR/MCD    Progress Note Due on Visit 20    PT Start Time 0852    PT Stop Time 0933    PT Time Calculation (min) 41 min    Activity Tolerance Patient tolerated treatment well    Behavior During Therapy [facility] for tasks assessed/performed             Past Medical History:  Diagnosis Date   Anxiety    Elevated LFTs    History of colon polyps    HLD (hyperlipidemia)    HTN (hypertension)    Localized superficial swelling, mass, or lump     Past Surgical History:  Procedure Laterality Date   CARDIOVERSION N/A 02/05/2019   Procedure: CARDIOVERSION;  Surgeon: Josue Hector, MD;  Location: [facility];  Service: Cardiovascular;  Laterality: N/A;   CERVICAL DISC SURGERY     IR CT HEAD LTD  03/12/2021   IR PERCUTANEOUS ART THROMBECTOMY/INFUSION INTRACRANIAL INC DIAG ANGIO  03/12/2021   IR US GUIDE VASC ACCESS RIGHT  03/15/2021   LEG SURGERY     right leg and ankle   NECK SURGERY     cyst removed   RADIOLOGY WITH ANESTHESIA N/A 03/12/2021   Procedure: RADIOLOGY WITH ANESTHESIA;  Surgeon: Radiologist, Medication, MD;  Location: [facility];  Service: Radiology;  Laterality: N/A;   TEE WITHOUT CARDIOVERSION N/A 02/05/2019   Procedure: TRANSESOPHAGEAL ECHOCARDIOGRAM (TEE);  Surgeon: Josue Hector, MD;  Location: [facility] ENDOSCOPY;  Service: Cardiovascular;  Laterality: N/A;   TOTAL HIP ARTHROPLASTY     left    There were no vitals filed for this visit.   Subjective Assessment - 07/22/21 0856     Subjective "House  is falling apart."  Has been working a lot of things at home.  Is bothering his Left hip; L foot goes to sleep in standing and walking, feels better when he's sitting.  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    Patient is accompained by: Family member    Pertinent History MRI+acute infarct at the right basal ganglia. Few and  punctate bifrontal peripheral infarcts. Recent medial left thalamic infarct which may be subacute. 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  L THA.    Limitations Standing;Walking    How long can you sit comfortably? >15 min    How long can you stand comfortably? <15 min    How long can you walk comfortably? <15 min    Patient Stated Goals To become more steady on his feet and return to outside ambulation    Currently in Pain? Yes    Pain Onset More than a month ago                [facility] PT Assessment - 07/22/21 1227       Palpation   Palpation comment Pt very tender to palpation over L greater trochanter; pt reports bruising due to lying on his side in  gravel to work on truck.  Also reports significant tenderness and soreness in bilat quads due to increased effort to rise to standing from ground.  No tenderness in piriformis, IT band or hamstring on LLE              [facility] Adult PT Treatment/Exercise - 07/22/21 0929       Exercises   Exercises Knee/Hip      Knee/Hip Exercises: Stretches   Active Hamstring Stretch Left;2 reps;20 seconds;Limitations    Active Hamstring Stretch Limitations seated in chair    Passive Hamstring Stretch Left;2 reps;60 seconds    Passive Hamstring Stretch Limitations Supine, therapist maintained LE position, cued pt to perform ankle pumps for sciatic nn gliding    ITB Stretch Left;2 reps;30 seconds    Piriformis Stretch Left;1 rep;30 seconds;Limitations    Piriformis Stretch Limitations felt stretch in low back              Balance  Exercises - 07/22/21 0935       Balance Exercises: Standing   Standing Eyes Closed Narrow base of support (BOS);Head turns;Solid surface;Other reps (comment)    Standing Eyes Closed Limitations feet together, 10 reps head turns and nods    SLS Eyes open;Solid surface;2 reps;10 secs;Intermittent upper extremity support   R and L side   Gait with Head Turns Forward;Retro;4 reps   intermittent head turns to L and R, up and down, min A with retro gait   Sit to Stand Upper extremity support;Foam/compliant surface;Other (comment)   8 reps with slow stand > sit              PT Education - 07/22/21 1230     Education Details updated HEP, advised to use ice on hip.  Discussed use of kneeling pad when kneeling or lying on the ground to work on [employer]) Educated Patient    Methods Explanation;Demonstration;Handout    Comprehension Verbalized understanding;Returned demonstration              PT Short Term Goals - 07/17/21 1644       PT SHORT TERM GOAL #1   Title Pt will demonstrate independence with updated HEP    Baseline [id number]    Time 3    Period Weeks    Status Revised    Target Date 08/07/21      PT SHORT TERM GOAL #2   Title Patient to negotiate 16 stairs without rails, alternating sequence, min guard    Time 3    Period Weeks    Status Revised    Target Date 08/07/21      PT SHORT TERM GOAL #3   Title Pt will decrease five time sit to stand time to </= 13 seconds without use of UE    Baseline 14 seconds    Time 3    Period Weeks    Status Revised    Target Date 08/07/21      PT SHORT TERM GOAL #4   Title Patient will demonstrate ability to transfer from floor <> stand with one UE support, MOD I    Time 3    Period Weeks    Status Revised    Target Date 08/07/21      PT SHORT TERM GOAL #5   Title Pt will increase FGA score to >/= 24/30    Baseline 21/30    Time 3    Period Weeks    Status New  Target Date 08/07/21                PT Long Term Goals - 07/17/21 1647       PT LONG TERM GOAL #1   Title Patient to decreased 5x STS time to <12s w/o UE assist    Time 6    Period Weeks    Status Revised    Target Date 08/31/21      PT LONG TERM GOAL #2   Title Patient to increase score of FGA to 27/30    Time 6    Period Weeks    Status Revised    Target Date 08/31/21      PT LONG TERM GOAL #3   Title Patient to negotiate 16 steps with alternting pattern w/o UE support, MOD I    Time 6    Period Weeks    Status Revised    Target Date 08/31/21      PT LONG TERM GOAL #4   Title Pt will demonstrate ability to transition floor <> standing, MOD I without use of UE on furniture    Time 6    Period Weeks    Status Revised    Target Date 08/31/21      PT LONG TERM GOAL #5   Title Increase FOTO score to 71    Baseline Initial score 55 > 53    Time 6    Period Weeks    Status Revised    Target Date 08/31/21               Plan - 07/22/21 1400     Clinical Impression Statement Due to significant LLE pain, initiated session with performing palpation and LLE stretches.  No significant issues in muscles around L hip joint, but pt very tender over greater trochanter, will continue to monitor.  Treatment session focused on review and update/upgrade of current balance exercises.  Did not upgrade LE strengthening exercises due to soreness and pain in L hip.  If hip pain has improved, will begin to upgrade LE strengthening exercises to work towards goal of rising from floor independently without LOB.    Comorbidities CVA, L THA    Rehab Potential Good    PT Frequency 1x / week    PT Duration 6 weeks    PT Treatment/Interventions ADLs/Self Care Home Management;Aquatic Therapy;Gait training;Stair training;Functional mobility training;Therapeutic activities;Balance training;Neuromuscular re-education;Therapeutic exercise;Patient/family education;Cryotherapy;Moist Heat;Orthotic Fit/Training    PT Next Visit  Plan How are balance exercises?  Upgrade LE strengthening exercises.  Stairs, alternating sequence, increased use of LLE.  Standing balance on compliant surfaces(trampoline/foam/rockerboard), EC.  higher level gait and balance training, stairs without UE support/obstacles/curb.  Getting up off the floor without UE support    PT Home Exercise Plan [facility]    Consulted and Agree with Plan of Care Patient             Patient will benefit from skilled therapeutic intervention in order to improve the following deficits and impairments:  Abnormal gait, Difficulty walking, Decreased endurance, Decreased activity tolerance, Decreased balance, Decreased strength  Visit Diagnosis: Unsteadiness on feet  Muscle weakness (generalized)  Difficulty in walking, not elsewhere classified  Foot drop, left   Problem List Patient Active Problem List   Diagnosis Date Noted   Chronic left shoulder pain 06/22/2021   Dysphagia    Leucocytosis    Benign essential HTN    Acute gout of right knee    Acute ischemic right MCA  stroke ([place]) 03/12/2021   Alcohol abuse    Seizure ([place])    Essential hypertension 12/26/2019   Longstanding persistent atrial fibrillation [facility])    Atrial fibrillation with RVR ([place]) 02/03/2019   Hyperlipidemia 02/03/2019   Anxiety 02/03/2019    Rico Junker, PT, DPT 07/22/21    2:12 PM  [facility] [address] [place], [place], [address] Phone: [phone]   Fax:  [phone]  Name: Paxtin Yax MRN: [id number] Date of Birth: 1956/03/27

## 2021-07-26 ENCOUNTER — Other Ambulatory Visit: Payer: Self-pay

## 2021-07-26 ENCOUNTER — Encounter: Payer: Self-pay | Admitting: Physical Therapy

## 2021-07-26 ENCOUNTER — Ambulatory Visit: Payer: Medicare HMO | Admitting: Occupational Therapy

## 2021-07-26 ENCOUNTER — Ambulatory Visit: Payer: Medicare HMO | Admitting: Physical Therapy

## 2021-07-26 DIAGNOSIS — M6281 Muscle weakness (generalized): Secondary | ICD-10-CM | POA: Diagnosis not present

## 2021-07-26 DIAGNOSIS — M25612 Stiffness of left shoulder, not elsewhere classified: Secondary | ICD-10-CM | POA: Diagnosis not present

## 2021-07-26 DIAGNOSIS — M21372 Foot drop, left foot: Secondary | ICD-10-CM | POA: Diagnosis not present

## 2021-07-26 DIAGNOSIS — R2681 Unsteadiness on feet: Secondary | ICD-10-CM | POA: Diagnosis not present

## 2021-07-26 DIAGNOSIS — R262 Difficulty in walking, not elsewhere classified: Secondary | ICD-10-CM

## 2021-07-26 DIAGNOSIS — M25512 Pain in left shoulder: Secondary | ICD-10-CM | POA: Diagnosis not present

## 2021-07-26 DIAGNOSIS — R41840 Attention and concentration deficit: Secondary | ICD-10-CM | POA: Diagnosis not present

## 2021-07-26 NOTE — Therapy (Signed)
[facility] [address] [place], [place], [address] Phone: [phone]   Fax:  [phone]  Physical Therapy Treatment  Patient Details  Name: Patrick Padilla MRN: [id number] Date of Birth: 1955/12/13 Referring Provider (PT): Cathlyn Parsons, PA-C   Encounter Date: 07/26/2021   PT End of Session - 07/26/21 0806     Visit Number 18    Number of Visits 22    Date for PT Re-Evaluation 08/29/21    Authorization Type MCR/MCD    Progress Note Due on Visit 20    PT Start Time 0803    PT Stop Time 0845    PT Time Calculation (min) 42 min    Equipment Utilized During Treatment Gait belt    Activity Tolerance Patient tolerated treatment well    Behavior During Therapy WFL for tasks assessed/performed             Past Medical History:  Diagnosis Date   Anxiety    Elevated LFTs    History of colon polyps    HLD (hyperlipidemia)    HTN (hypertension)    Localized superficial swelling, mass, or lump     Past Surgical History:  Procedure Laterality Date   CARDIOVERSION N/A 02/05/2019   Procedure: CARDIOVERSION;  Surgeon: Josue Hector, MD;  Location: [facility];  Service: Cardiovascular;  Laterality: N/A;   CERVICAL DISC SURGERY     IR CT HEAD LTD  03/12/2021   IR PERCUTANEOUS ART THROMBECTOMY/INFUSION INTRACRANIAL INC DIAG ANGIO  03/12/2021   IR US GUIDE VASC ACCESS RIGHT  03/15/2021   LEG SURGERY     right leg and ankle   NECK SURGERY     cyst removed   RADIOLOGY WITH ANESTHESIA N/A 03/12/2021   Procedure: RADIOLOGY WITH ANESTHESIA;  Surgeon: Radiologist, Medication, MD;  Location: [facility];  Service: Radiology;  Laterality: N/A;   TEE WITHOUT CARDIOVERSION N/A 02/05/2019   Procedure: TRANSESOPHAGEAL ECHOCARDIOGRAM (TEE);  Surgeon: Josue Hector, MD;  Location: [facility] ENDOSCOPY;  Service: Cardiovascular;  Laterality: N/A;   TOTAL HIP ARTHROPLASTY     left    There were no vitals filed for this visit.   Subjective  Assessment - 07/26/21 0805     Subjective No new complaints. 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    Pertinent History MRI+acute infarct at the right basal ganglia. Few and  punctate bifrontal peripheral infarcts. Recent medial left thalamic infarct which may be subacute. 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  L THA.    Limitations Standing;Walking    How long can you sit comfortably? >15 min    How long can you stand comfortably? <15 min    How long can you walk comfortably? <15 min    Patient Stated Goals To become more steady on his feet and return to outside ambulation    Currently in Pain? Yes    Pain Score 4     Pain Location Hip    Pain Orientation Left    Pain Descriptors / Indicators Aching;Tightness;Discomfort    Pain Type Acute pain;Neuropathic pain    Pain Onset More than a month ago    Pain Frequency Constant    Aggravating Factors  walking    Pain Relieving Factors resting in recliner                    [facility] Adult PT Treatment/Exercise - 07/26/21 [id number]  Transfers   Transfers Sit to Stand;Stand to Sit    Sit to Stand 6: Modified independent (Device/Increase time)    Stand to Sit 6: Modified independent (Device/Increase time)      Ambulation/Gait   Ambulation/Gait Yes    Ambulation/Gait Assistance 5: Supervision    Ambulation Distance (Feet) --   around clinic with session   Assistive device None    Gait Pattern Step-through pattern;Decreased arm swing - right;Decreased arm swing - left;Decreased step length - right;Decreased step length - left;Decreased dorsiflexion - right;Decreased dorsiflexion - left;Lateral hip instability    Ambulation Surface Level;Indoor      Knee/Hip Exercises: Stretches   Active Hamstring Stretch Left;2 reps;30 seconds;Limitations    Active Hamstring Stretch Limitations seated at edge of mat table    Passive Hamstring Stretch Left;2 reps;60  seconds    Passive Hamstring Stretch Limitations Supine, therapist maintained LE position, cued pt to perform ankle pumps for sciatic nn gliding    Piriformis Stretch Left;2 reps;30 seconds;Limitations    Piriformis Stretch Limitations felt stretch in low back when pushing knee away,    Other Knee/Hip Stretches lower trunk rotation left<>right for 30 second holds x 2 reps each way                 Balance Exercises - 07/26/21 0819       Balance Exercises: Standing   SLS Eyes open;Foam/compliant surface;Intermittent upper extremity support;Other reps (comment);Limitations    SLS Limitations on airex with 2 tall cones on floor in front: alternating foward foot taps, then cross foot taps for ~10 reps each, light to no support on bars. Min guard assist for safety with cues on stance position, weight shifting.   Rockerboard Anterior/posterior;Lateral;Head turns;EO;EC;30 seconds;Other reps (comment);Intermittent UE support;Limitations    Rockerboard Limitations performed both ways on balance board: rocking the board with emphasis on tall posture with EO, progressing to EC (limited to ~5 reps with EO only in lateral direction due to hip pain, not able to progress to [facility]), min guard to min assist for balance. then had pt holding the board steady for EC 30 sec's x 3 reps, progressing to EC head movements left<>right, up<>down for ~10 reps each. min guard to min assist for balance. cues on posture/weight shifting to assist with balance recovery.                  PT Short Term Goals - 07/17/21 1644       PT SHORT TERM GOAL #1   Title Pt will demonstrate independence with updated HEP    Baseline [id number]    Time 3    Period Weeks    Status Revised    Target Date 08/07/21      PT SHORT TERM GOAL #2   Title Patient to negotiate 16 stairs without rails, alternating sequence, min guard    Time 3    Period Weeks    Status Revised    Target Date 08/07/21      PT SHORT TERM GOAL #3    Title Pt will decrease five time sit to stand time to </= 13 seconds without use of UE    Baseline 14 seconds    Time 3    Period Weeks    Status Revised    Target Date 08/07/21      PT SHORT TERM GOAL #4   Title Patient will demonstrate ability to transfer from floor <> stand with one UE support, MOD I  Time 3    Period Weeks    Status Revised    Target Date 08/07/21      PT SHORT TERM GOAL #5   Title Pt will increase FGA score to >/= 24/30    Baseline 21/30    Time 3    Period Weeks    Status New    Target Date 08/07/21               PT Long Term Goals - 07/17/21 1647       PT LONG TERM GOAL #1   Title Patient to decreased 5x STS time to <12s w/o UE assist    Time 6    Period Weeks    Status Revised    Target Date 08/31/21      PT LONG TERM GOAL #2   Title Patient to increase score of FGA to 27/30    Time 6    Period Weeks    Status Revised    Target Date 08/31/21      PT LONG TERM GOAL #3   Title Patient to negotiate 16 steps with alternting pattern w/o UE support, MOD I    Time 6    Period Weeks    Status Revised    Target Date 08/31/21      PT LONG TERM GOAL #4   Title Pt will demonstrate ability to transition floor <> standing, MOD I without use of UE on furniture    Time 6    Period Weeks    Status Revised    Target Date 08/31/21      PT LONG TERM GOAL #5   Title Increase FOTO score to 71    Baseline Initial score 55 > 53    Time 6    Period Weeks    Status Revised    Target Date 08/31/21                   Plan - 07/26/21 0807     Clinical Impression Statement Today's skilled session initially focused on stretching to address hip pain with pt reporting decreased pain afterwards. Remainder of session continued to address balance training on compliant surfaces with no significant issues noted. Pt did have mild increased in hip discomfort with rocking the board in lateral direction that resolved when activity stopped. No other  issues noted or reported. The pt is making progress and should benefit from continued PT to progress toward unmet goals.    Comorbidities CVA, L THA    Rehab Potential Good    PT Frequency 1x / week    PT Duration 6 weeks    PT Treatment/Interventions ADLs/Self Care Home Management;Aquatic Therapy;Gait training;Stair training;Functional mobility training;Therapeutic activities;Balance training;Neuromuscular re-education;Therapeutic exercise;Patient/family education;Cryotherapy;Moist Heat;Orthotic Fit/Training    PT Next Visit Plan Upgrade LE strengthening exercises.  Stairs, alternating sequence, increased use of LLE.  Standing balance on compliant surfaces(trampoline/foam/rockerboard), EC.  higher level gait and balance training, stairs without UE support/obstacles/curb.  Getting up off the floor without UE support    PT Home Exercise Plan [facility]    Consulted and Agree with Plan of Care Patient             Patient will benefit from skilled therapeutic intervention in order to improve the following deficits and impairments:  Abnormal gait, Difficulty walking, Decreased endurance, Decreased activity tolerance, Decreased balance, Decreased strength  Visit Diagnosis: Unsteadiness on feet  Muscle weakness (generalized)  Difficulty in walking, not elsewhere classified  Foot  drop, left     Problem List Patient Active Problem List   Diagnosis Date Noted   Chronic left shoulder pain 06/22/2021   Dysphagia    Leucocytosis    Benign essential HTN    Acute gout of right knee    Acute ischemic right MCA stroke ([place]) 03/12/2021   Alcohol abuse    Seizure ([place])    Essential hypertension 12/26/2019   Longstanding persistent atrial fibrillation ([place])    Atrial fibrillation with RVR ([place]) 02/03/2019   Hyperlipidemia 02/03/2019   Anxiety 02/03/2019   Willow Ora, PTA, [facility] [address], [place], [place] [address] [phone] 07/26/21, 1:08 PM    Name: Baine Brester MRN: [id number] Date of Birth: 1956-06-24

## 2021-07-28 ENCOUNTER — Ambulatory Visit: Payer: Medicare HMO | Admitting: Occupational Therapy

## 2021-08-02 ENCOUNTER — Encounter: Payer: Medicare Other | Admitting: Occupational Therapy

## 2021-08-04 ENCOUNTER — Encounter: Payer: Medicare Other | Admitting: Occupational Therapy

## 2021-08-04 ENCOUNTER — Ambulatory Visit: Payer: Medicare HMO | Admitting: Physical Therapy

## 2021-08-09 ENCOUNTER — Encounter: Payer: Medicare Other | Admitting: Occupational Therapy

## 2021-08-11 ENCOUNTER — Ambulatory Visit: Payer: Medicare HMO | Attending: Physician Assistant | Admitting: Physical Therapy

## 2021-08-11 ENCOUNTER — Encounter: Payer: Medicare Other | Admitting: Occupational Therapy

## 2021-08-11 DIAGNOSIS — R262 Difficulty in walking, not elsewhere classified: Secondary | ICD-10-CM | POA: Insufficient documentation

## 2021-08-11 DIAGNOSIS — M21372 Foot drop, left foot: Secondary | ICD-10-CM | POA: Insufficient documentation

## 2021-08-11 DIAGNOSIS — R2681 Unsteadiness on feet: Secondary | ICD-10-CM | POA: Insufficient documentation

## 2021-08-11 DIAGNOSIS — M6281 Muscle weakness (generalized): Secondary | ICD-10-CM | POA: Insufficient documentation

## 2021-08-16 ENCOUNTER — Encounter: Payer: Medicare Other | Admitting: Occupational Therapy

## 2021-08-18 ENCOUNTER — Ambulatory Visit: Payer: Medicare HMO | Admitting: Physical Therapy

## 2021-08-18 ENCOUNTER — Encounter: Payer: Medicare Other | Admitting: Occupational Therapy

## 2021-08-18 ENCOUNTER — Other Ambulatory Visit: Payer: Self-pay

## 2021-08-18 DIAGNOSIS — R2681 Unsteadiness on feet: Secondary | ICD-10-CM | POA: Diagnosis not present

## 2021-08-18 DIAGNOSIS — M21372 Foot drop, left foot: Secondary | ICD-10-CM

## 2021-08-18 DIAGNOSIS — R262 Difficulty in walking, not elsewhere classified: Secondary | ICD-10-CM

## 2021-08-18 DIAGNOSIS — M6281 Muscle weakness (generalized): Secondary | ICD-10-CM

## 2021-08-18 NOTE — Therapy (Signed)
[facility] [address] [place], [place], [address] Phone: [phone]   Fax:  [phone]  Physical Therapy Treatment and 10th Visit Progress Note  Patient Details  Name: Patrick Padilla MRN: [id number] Date of Birth: 05/19/1956 Referring Provider (PT): Cathlyn Parsons, PA-C   Encounter Date: 08/18/2021  Progress Note Reporting Period 05/27/21 to 08/18/21  See note below for Objective Data and Assessment of Progress/Goals.     PT End of Session - 08/18/21 0845     Visit Number 19   10th visit PN completed today   Number of Visits 27    Date for PT Re-Evaluation 10/27/21    Authorization Type MCR/MCD    Progress Note Due on Visit 29    PT Start Time 0808    PT Stop Time 0840    PT Time Calculation (min) 32 min    Activity Tolerance Patient limited by pain    Behavior During Therapy WFL for tasks assessed/performed             Past Medical History:  Diagnosis Date   Anxiety    Elevated LFTs    History of colon polyps    HLD (hyperlipidemia)    HTN (hypertension)    Localized superficial swelling, mass, or lump     Past Surgical History:  Procedure Laterality Date   CARDIOVERSION N/A 02/05/2019   Procedure: CARDIOVERSION;  Surgeon: Josue Hector, MD;  Location: [facility];  Service: Cardiovascular;  Laterality: N/A;   CERVICAL DISC SURGERY     IR CT HEAD LTD  03/12/2021   IR PERCUTANEOUS ART THROMBECTOMY/INFUSION INTRACRANIAL INC DIAG ANGIO  03/12/2021   IR US GUIDE VASC ACCESS RIGHT  03/15/2021   LEG SURGERY     right leg and ankle   NECK SURGERY     cyst removed   RADIOLOGY WITH ANESTHESIA N/A 03/12/2021   Procedure: RADIOLOGY WITH ANESTHESIA;  Surgeon: Radiologist, Medication, MD;  Location: [facility];  Service: Radiology;  Laterality: N/A;   TEE WITHOUT CARDIOVERSION N/A 02/05/2019   Procedure: TRANSESOPHAGEAL ECHOCARDIOGRAM (TEE);  Surgeon: Josue Hector, MD;  Location: [facility]COPY;  Service:  Cardiovascular;  Laterality: N/A;   TOTAL HIP ARTHROPLASTY     left    There were no vitals filed for this visit.   Subjective Assessment - 08/18/21 0811     Subjective Had a fall forwards at home when getting up from a chair, feet got tangled in table and he fell forwards hitting ribs on table.  Ribs on L side very painful, especially with deep breathing, coughing, bed mobility, sit > stand.  Drove self today; having issues with transportation.    Pertinent History MRI+acute infarct at the right basal ganglia. Few and  punctate bifrontal peripheral infarcts. Recent medial left thalamic infarct which may be subacute. 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  L THA.    Limitations Standing;Walking    How long can you sit comfortably? >15 min    How long can you stand comfortably? <15 min    How long can you walk comfortably? <15 min    Patient Stated Goals To become more steady on his feet and return to outside ambulation    Currently in Pain? Yes    Pain Onset More than a month ago                [facility] PT Assessment - 08/18/21 0818       Assessment  Medical Diagnosis I63.511 (ICD-10-CM) - Cerebral infarction due to unspecified occlusion or stenosis of right middle cerebral artery    Referring Provider (PT) Cathlyn Parsons, PA-C    Onset Date/Surgical Date 03/12/21    Hand Dominance Right    Next MD Visit 05/16/21    Prior Therapy CIR      Precautions   Precautions Other (comment)    Precaution Comments No driving, no smoking, no drinking, no heavy lifting      Balance Screen   Has the patient fallen in the past 6 months Yes      Prior Function   Level of Independence Independent      Observation/Other Assessments   Focus on Therapeutic Outcomes (FOTO)  LE Stroke: 72 increased from 45      Transfers   Floor to Transfer Not tested (comment)    Floor to Transfer Details (indicate cue type and reason) unable to  perform today due to significant rib pain after fall.      Ambulation/Gait   Ambulation/Gait Yes    Ambulation/Gait Assistance 6: Modified independent (Device/Increase time)    Ambulation Distance (Feet) 230 Feet    Assistive device None    Gait Pattern Step-through pattern;Decreased arm swing - right;Decreased arm swing - left;Decreased dorsiflexion - right;Decreased dorsiflexion - left;Decreased trunk rotation;Poor foot clearance - left;Poor foot clearance - right    Ambulation Surface Level;Indoor    Stairs Yes    Stairs Assistance 5: Supervision    Stairs Assistance Details (indicate cue type and reason) using 1-2 rails today due to rib pain    Stair Management Technique One rail Right;One rail Left;Two rails;Alternating pattern;Forwards    Number of Stairs 16    Height of Stairs 6      Standardized Balance Assessment   Standardized Balance Assessment Five Times Sit to Stand    Five times sit to stand comments  23 seconds from chair without use of UE; slower today due to rib pain      Functional Gait  Assessment   Gait assessed  Yes    Gait Level Surface Walks 20 ft in less than 7 sec but greater than 5.5 sec, uses assistive device, slower speed, mild gait deviations, or deviates 6-10 in outside of the 12 in walkway width.    Change in Gait Speed Able to change speed, demonstrates mild gait deviations, deviates 6-10 in outside of the 12 in walkway width, or no gait deviations, unable to achieve a major change in velocity, or uses a change in velocity, or uses an assistive device.    Gait with Horizontal Head Turns Performs head turns smoothly with no change in gait. Deviates no more than 6 in outside 12 in walkway width    Gait with Vertical Head Turns Performs head turns with no change in gait. Deviates no more than 6 in outside 12 in walkway width.    Gait and Pivot Turn Pivot turns safely within 3 sec and stops quickly with no loss of balance.    Step Over Obstacle Is able to step  over one shoe box (4.5 in total height) without changing gait speed. No evidence of imbalance.    Gait with Narrow Base of Support Ambulates 4-7 steps.    Gait with Eyes Closed Walks 20 ft, slow speed, abnormal gait pattern, evidence for imbalance, deviates 10-15 in outside 12 in walkway width. Requires more than 9 sec to ambulate 20 ft.    Ambulating Backwards Walks 20 ft,  uses assistive device, slower speed, mild gait deviations, deviates 6-10 in outside 12 in walkway width.    Steps Alternating feet, must use rail.    Total Score 21    FGA comment: 21/30, no change in score, no dizziness.  Significant rib pain today.               PT Education - 08/18/21 0845     Education Details Areas to continue to address; adding more PT visits    Person(s) Educated Patient    Methods Explanation    Comprehension Verbalized understanding              PT Short Term Goals - 07/17/21 1644       PT SHORT TERM GOAL #1   Title Pt will demonstrate independence with updated HEP    Baseline [email or web address]    Time 3    Period Weeks    Status Revised    Target Date 08/07/21      PT SHORT TERM GOAL #2   Title Patient to negotiate 16 stairs without rails, alternating sequence, min guard    Time 3    Period Weeks    Status Revised    Target Date 08/07/21      PT SHORT TERM GOAL #3   Title Pt will decrease five time sit to stand time to </= 13 seconds without use of UE    Baseline 14 seconds    Time 3    Period Weeks    Status Revised    Target Date 08/07/21      PT SHORT TERM GOAL #4   Title Patient will demonstrate ability to transfer from floor <> stand with one UE support, MOD I    Time 3    Period Weeks    Status Revised    Target Date 08/07/21      PT SHORT TERM GOAL #5   Title Pt will increase FGA score to >/= 24/30    Baseline 21/30    Time 3    Period Weeks    Status New    Target Date 08/07/21               PT Long Term Goals - 08/18/21 1112       PT LONG  TERM GOAL #1   Title Patient to decreased 5x STS time to <12s w/o UE assist    Baseline 23 seconds due to rib pain today    Time 6    Period Weeks    Status Not Met      PT LONG TERM GOAL #2   Title Patient to increase score of FGA to 27/30    Baseline 21/30 without AD, no change, rib pain today    Time 6    Period Weeks    Status Not Met      PT LONG TERM GOAL #3   Title Patient to negotiate 16 steps with alternting pattern w/o UE support, MOD I    Baseline 16 steps, alternating with UE support on rails    Time 6    Period Weeks    Status Partially Met      PT LONG TERM GOAL #4   Title Pt will demonstrate ability to transition floor <> standing, MOD I without use of UE on furniture    Baseline not able to assess due to rib pain    Time 6    Period Weeks    Status Unable to  assess      PT LONG TERM GOAL #5   Title Increase FOTO score to 71    Baseline Initial score 53 > 55    Time 6    Period Weeks    Status On-going            New goals for recert:  PT Short Term Goals - 08/18/21 1130       PT SHORT TERM GOAL #1   Title = LTG (set at 4 weeks, reset to 8 weeks)             PT Long Term Goals - 08/18/21 1131       PT LONG TERM GOAL #1   Title Patient to decreased 5x STS time to <17s w/o UE assist  (assess at 4 weeks 3/23 and then again at 8 weeks 4/20)    Baseline 23 seconds due to rib pain today    Time 4    Period Weeks    Status Revised    Target Date 09/29/21      PT LONG TERM GOAL #2   Title Patient to increase score of FGA to 23/30    Baseline 21/30 without AD, no change, rib pain today    Time 4    Period Weeks    Status Revised    Target Date 09/29/21      PT LONG TERM GOAL #3   Title Patient to negotiate 16 steps with alternting pattern w/o UE support, MOD I    Baseline 16 steps, alternating with UE support on rails    Time 4    Period Weeks    Status Revised    Target Date 09/29/21      PT LONG TERM GOAL #4   Title Pt will  demonstrate ability to transition floor <> standing, MOD I without use of UE on furniture    Baseline not able to assess due to rib pain    Time 4    Period Weeks    Status Revised    Target Date 09/29/21      PT LONG TERM GOAL #5   Title Increase FOTO score to 60    Baseline Initial score 53 > 55    Time 4    Period Weeks    Status Revised    Target Date 09/29/21              Plan - 08/18/21 1114     Clinical Impression Statement Pt has missed two previous appointments due to issues with transportation.  Pt recently had a fall with injury to L ribs; pain in ribs affected mobility today.  Performed assessment of progress towards LTG.  Pt has not met any LTG but decline in five time sit to stand likely due to rib pain and pt moving more cautiously today.  No change in FGA score but pt continues to report full resolution of dizziness.  Pt able to negotiate full 16 stairs but continues to require UE for support and balance.  Pt does report slight improvement in FOTO function.  Unable to perform floor <> stand transfers today due to rib pain.  Pt did not report any hip pain today.  Pt would benefit from continued PT services to address ongoing impairments in L sided weakness, impaired standing balance, and impaired gait to maximize functional mobility independence and decrease falls risk.  Will place pt on hold x 2 weeks to allow ribs to heal and will resume  therapy at 1x/week.    Comorbidities CVA, L THA    Rehab Potential Good    PT Frequency 1x / week    PT Duration Other (comment)   10 weeks total, 2 weeks on hold due to rib injury, 8 weeks of treatment   PT Treatment/Interventions ADLs/Self Care Home Management;Aquatic Therapy;Gait training;Stair training;Functional mobility training;Therapeutic activities;Balance training;Neuromuscular re-education;Therapeutic exercise;Patient/family education;Cryotherapy;Moist Heat;Orthotic Fit/Training;Passive range of motion    PT Next Visit Plan  How are ribs?  Upgrade LE strengthening exercises.  Stairs, alternating sequence, increased use of LLE.  Standing balance on compliant surfaces(trampoline/foam/rockerboard), EC.  higher level gait and balance training, stairs without UE support/obstacles/curb.  Getting up off the floor without UE support    PT Home Exercise Plan [facility]    Consulted and Agree with Plan of Care Patient             Patient will benefit from skilled therapeutic intervention in order to improve the following deficits and impairments:  Abnormal gait, Difficulty walking, Decreased endurance, Decreased activity tolerance, Decreased balance, Decreased strength  Visit Diagnosis: Unsteadiness on feet  Difficulty in walking, not elsewhere classified  Muscle weakness (generalized)  Foot drop, left     Problem List Patient Active Problem List   Diagnosis Date Noted   Chronic left shoulder pain 06/22/2021   Dysphagia    Leucocytosis    Benign essential HTN    Acute gout of right knee    Acute ischemic right MCA stroke ([place]) 03/12/2021   Alcohol abuse    Seizure ([place])    Essential hypertension 12/26/2019   Longstanding persistent atrial fibrillation ([facility])    Atrial fibrillation with RVR ([place]) 02/03/2019   Hyperlipidemia 02/03/2019   Anxiety 02/03/2019    Rico Junker, PT, DPT 08/18/21    11:33 AM    [facility] [address] [place], [place], [address] Phone: [phone]   Fax:  [phone]  Name: Leven Hoel MRN: [id number] Date of Birth: Dec 02, 1955

## 2021-08-27 ENCOUNTER — Other Ambulatory Visit: Payer: Self-pay | Admitting: Family Medicine

## 2021-08-30 ENCOUNTER — Encounter: Payer: Medicare Other | Admitting: Physician Assistant

## 2021-08-30 NOTE — Progress Notes (Signed)
This encounter was created in error - please disregard.

## 2021-09-09 ENCOUNTER — Ambulatory Visit: Payer: Medicare Other | Attending: Physician Assistant | Admitting: Physical Therapy

## 2021-09-09 ENCOUNTER — Other Ambulatory Visit: Payer: Self-pay

## 2021-09-09 DIAGNOSIS — M21372 Foot drop, left foot: Secondary | ICD-10-CM | POA: Diagnosis not present

## 2021-09-09 DIAGNOSIS — R2681 Unsteadiness on feet: Secondary | ICD-10-CM | POA: Diagnosis not present

## 2021-09-09 DIAGNOSIS — R262 Difficulty in walking, not elsewhere classified: Secondary | ICD-10-CM | POA: Diagnosis not present

## 2021-09-09 DIAGNOSIS — M6281 Muscle weakness (generalized): Secondary | ICD-10-CM

## 2021-09-09 NOTE — Therapy (Signed)
[facility] ?[address] ?[place], [place], [address] ?Phone: [phone]   Fax:  [phone] ? ?Physical Therapy Treatment ? ?Patient Details  ?Name: Patrick Padilla ?MRN: [id number] ?Date of Birth: Apr 21, 1956 ?Referring Provider (PT): Mcarthur Rossetti Angiulli, PA-C ? ? ?Encounter Date: 09/09/2021 ? ? PT End of Session - 09/09/21 1508   ? ? Visit Number 20   ? Number of Visits 27   ? Date for PT Re-Evaluation 10/27/21   ? Authorization Type MCR/MCD   ? Progress Note Due on Visit 29   ? PT Start Time 1410   ? PT Stop Time 1445   ? PT Time Calculation (min) 35 min   ? Activity Tolerance Patient tolerated treatment well   ? Behavior During Therapy [facility] for tasks assessed/performed   ? ?  ?  ? ?  ? ? ?Past Medical History:  ?Diagnosis Date  ? Anxiety   ? Elevated LFTs   ? History of colon polyps   ? HLD (hyperlipidemia)   ? HTN (hypertension)   ? Localized superficial swelling, mass, or lump   ? ? ?Past Surgical History:  ?Procedure Laterality Date  ? CARDIOVERSION N/A 02/05/2019  ? Procedure: CARDIOVERSION;  Surgeon: Wendall Stade, MD;  Location: [facility] ENDOSCOPY;  Service: Cardiovascular;  Laterality: N/A;  ? CERVICAL DISC SURGERY    ? IR CT HEAD LTD  03/12/2021  ? IR PERCUTANEOUS ART THROMBECTOMY/INFUSION INTRACRANIAL INC DIAG ANGIO  03/12/2021  ? IR US GUIDE VASC ACCESS RIGHT  03/15/2021  ? LEG SURGERY    ? right leg and ankle  ? NECK SURGERY    ? cyst removed  ? RADIOLOGY WITH ANESTHESIA N/A 03/12/2021  ? Procedure: RADIOLOGY WITH ANESTHESIA;  Surgeon: Radiologist, Medication, MD;  Location: [facility];  Service: Radiology;  Laterality: N/A;  ? TEE WITHOUT CARDIOVERSION N/A 02/05/2019  ? Procedure: TRANSESOPHAGEAL ECHOCARDIOGRAM (TEE);  Surgeon: Wendall Stade, MD;  Location: [facility] END[facility]COPY;  Service: Cardiovascular;  Laterality: N/A;  ? TOTAL HIP ARTHROPLASTY    ? left  ? ? ?There were no vitals filed for this visit. ? ? Subjective Assessment - 09/09/21 1410   ? ? Subjective Drove  again today.  Ribs are much better.  No further falls.   ? Pertinent History MRI+acute infarct at the right basal ganglia. Few and  punctate bifrontal peripheral infarcts. Recent medial left thalamic infarct which may be subacute. 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  L THA.   ? Limitations Standing;Walking   ? How long can you sit comfortably? >15 min   ? How long can you stand comfortably? <15 min   ? How long can you walk comfortably? <15 min   ? Patient Stated Goals To become more steady on his feet and return to outside ambulation   ? Currently in Pain? No/denies   ? Pain Onset More than a month ago   ? ?  ?  ? ?  ? ? ? ?Reviewed pt's current HEP and upgraded all exercises as documented below.  Removed hamstring stretch, no loner experiencing tightness/tension in posterior LE. ? ?Access Code: [email or web address] ?URL: [email or web address] ?Date: 09/09/2021 ?Prepared by: Bufford Lope ? ?Exercises ?Tandem Walking with Counter Support - 2 x daily - 7 x weekly - 2 sets - 10 reps-cued pt to slow down and hold each step 2-3 seconds, light UE support ?Sit to Stand on Foam Pad - 1 x daily - 7 x weekly - 2 sets -  10 reps - progressed by removing UE support from chair ?Stand on pillow or cushion, Eyes closed, feet together - 1 x daily - 7 x weekly - 2 sets - 10 reps - chair beside pt for UE support as needed ?Step foward, step backwards - 1 x daily - 7 x weekly - 1 sets - 10 reps -standing on compliant pillow or cushion alternating between stepping forwards and backwards with R and LLE; chair beside pt for intermittent UE support, PRN ?Single Leg Stance on Foam Pad - 1 x daily - 7 x weekly - 3 sets - 10 seconds hold - holding to chair for one UE support, perform on R and LLE. ? ? ? ? PT Education - 09/09/21 1507   ? ? Education Details updated and progressed HEP   ? Person(s) Educated Patient   ? Methods Explanation;Demonstration;Handout   ? Comprehension  Verbalized understanding;Returned demonstration   ? ?  ?  ? ?  ? ? ? PT Short Term Goals - 08/18/21 1130   ? ?  ? PT SHORT TERM GOAL #1  ? Title = LTG (set at 4 weeks, reset to 8 weeks)   ? ?  ?  ? ?  ? ? ? ? PT Long Term Goals - 08/18/21 1131   ? ?  ? PT LONG TERM GOAL #1  ? Title Patient to decreased 5x STS time to <17s w/o UE assist  (assess at 4 weeks 3/23 and then again at 8 weeks 4/20)   ? Baseline 23 seconds due to rib pain today   ? Time 4   ? Period Weeks   ? Status Revised   ? Target Date 09/29/21   ?  ? PT LONG TERM GOAL #2  ? Title Patient to increase score of FGA to 23/30   ? Baseline 21/30 without AD, no change, rib pain today   ? Time 4   ? Period Weeks   ? Status Revised   ? Target Date 09/29/21   ?  ? PT LONG TERM GOAL #3  ? Title Patient to negotiate 16 steps with alternting pattern w/o UE support, MOD I   ? Baseline 16 steps, alternating with UE support on rails   ? Time 4   ? Period Weeks   ? Status Revised   ? Target Date 09/29/21   ?  ? PT LONG TERM GOAL #4  ? Title Pt will demonstrate ability to transition floor <> standing, MOD I without use of UE on furniture   ? Baseline not able to assess due to rib pain   ? Time 4   ? Period Weeks   ? Status Revised   ? Target Date 09/29/21   ?  ? PT LONG TERM GOAL #5  ? Title Increase FOTO score to 60   ? Baseline Initial score 53 > 55   ? Time 4   ? Period Weeks   ? Status Revised   ? Target Date 09/29/21   ? ?  ?  ? ?  ? ? ? ? ? ? ? ? Plan - 09/09/21 1509   ? ? Clinical Impression Statement Pt demonstrates full resolution of rib pain and no further falls.  Due to progress treatment session focused on updating and progressing patient's LE strength and standing balance HEP.  Focused primarily on balance on compliant surface.  Will also continue to utilize therapy session to perform functional strengthening for rising from the floor.   ?  Comorbidities CVA, L THA   ? Rehab Potential Good   ? PT Frequency 1x / week   ? PT Duration Other (comment)   10 weeks  total, 2 weeks on hold due to rib injury, 8 weeks of treatment  ? PT Treatment/Interventions ADLs/Self Care Home Management;Aquatic Therapy;Gait training;Stair training;Functional mobility training;Therapeutic activities;Balance training;Neuromuscular re-education;Therapeutic exercise;Patient/family education;Cryotherapy;Moist Heat;Orthotic Fit/Training;Passive range of motion   ? PT Next Visit Plan How are new exercises?  Getting up off the floor without UE support.  Stairs, alternating sequence, increased use of LLE.  Standing balance on compliant surfaces(trampoline/foam/rockerboard), EC.  higher level gait and balance training, stairs without UE support/obstacles/curb.   ? PT Home Exercise Plan [email or web address]   ? Consulted and Agree with Plan of Care Patient   ? ?  ?  ? ?  ? ? ?Patient will benefit from skilled therapeutic intervention in order to improve the following deficits and impairments:  Abnormal gait, Difficulty walking, Decreased endurance, Decreased activity tolerance, Decreased balance, Decreased strength ? ?Visit Diagnosis: ?Unsteadiness on feet ? ?Difficulty in walking, not elsewhere classified ? ?Muscle weakness (generalized) ? ?Foot drop, left ? ? ? ? ?Problem List ?Patient Active Problem List  ? Diagnosis Date Noted  ? Chronic left shoulder pain 06/22/2021  ? Dysphagia   ? Leucocytosis   ? Benign essential HTN   ? Acute gout of right knee   ? Acute ischemic right MCA stroke ([facility]) 03/12/2021  ? Alcohol abuse   ? Seizure ([facility])   ? Essential hypertension 12/26/2019  ? Longstanding persistent atrial fibrillation ([facility])   ? Atrial fibrillation with RVR ([facility]) 02/03/2019  ? Hyperlipidemia 02/03/2019  ? Anxiety 02/03/2019  ? ? ?Dierdre Highman, PT, DPT ?09/09/21    3:17 PM ? ? ? ?[facility] ?[address] ?[place], [place], [address] ?Phone: [phone]   Fax:  [phone] ? ?Name: Joanne Brander ?MRN: [id number] ?Date of Birth: 1956-02-03 ? ? ? ?

## 2021-09-09 NOTE — Patient Instructions (Signed)
Access Code: [email or web address] ?URL: [email or web address] ?Date: 09/09/2021 ?Prepared by: Misty Stanley ? ?Exercises ?Tandem Walking with Counter Support - 2 x daily - 7 x weekly - 2 sets - 10 reps ?Sit to Stand on Foam Pad - 1 x daily - 7 x weekly - 2 sets - 10 reps ?Stand on pillow or cushion, Eyes closed, feet together - 1 x daily - 7 x weekly - 2 sets - 10 reps ?Step foward, step backwards - 1 x daily - 7 x weekly - 1 sets - 10 reps ?Single Leg Stance on Foam Pad - 1 x daily - 7 x weekly - 3 sets - 10 seconds hold ? ?

## 2021-09-15 ENCOUNTER — Ambulatory Visit: Payer: Medicare Other | Admitting: Physical Therapy

## 2021-09-15 ENCOUNTER — Other Ambulatory Visit: Payer: Self-pay

## 2021-09-15 ENCOUNTER — Encounter: Payer: Self-pay | Admitting: Physical Therapy

## 2021-09-15 DIAGNOSIS — R262 Difficulty in walking, not elsewhere classified: Secondary | ICD-10-CM

## 2021-09-15 DIAGNOSIS — M6281 Muscle weakness (generalized): Secondary | ICD-10-CM

## 2021-09-15 DIAGNOSIS — R2681 Unsteadiness on feet: Secondary | ICD-10-CM | POA: Diagnosis not present

## 2021-09-15 DIAGNOSIS — M21372 Foot drop, left foot: Secondary | ICD-10-CM | POA: Diagnosis not present

## 2021-09-15 NOTE — Therapy (Signed)
[place] [facility] ?[place], [place], [address] ?Phone: [phone]   Fax:  [phone] ? ?Physical Therapy Treatment ? ?Patient Details  ?Name: Patrick Padilla ?MRN: [id number] ?Date of Birth: 05-06-1956 ?Referring Provider (PT): Lavon Paganini Angiulli, PA-C ? ? ?Encounter Date: 09/15/2021 ? ? PT End of Session - 09/15/21 1609   ? ? Visit Number 21   ? Number of Visits 27   ? Date for PT Re-Evaluation 10/27/21   ? Authorization Type MCR/MCD   ? Progress Note Due on Visit 29   ? PT Start Time 1536   ? PT Stop Time [id number]   ? PT Time Calculation (min) 29 min   ? Activity Tolerance Patient tolerated treatment well   ? Behavior During Therapy [facility] for tasks assessed/performed   ? ?  ?  ? ?  ? ? ?Past Medical History:  ?Diagnosis Date  ? Anxiety   ? Elevated LFTs   ? History of colon polyps   ? HLD (hyperlipidemia)   ? HTN (hypertension)   ? Localized superficial swelling, mass, or lump   ? ? ?Past Surgical History:  ?Procedure Laterality Date  ? CARDIOVERSION N/A 02/05/2019  ? Procedure: CARDIOVERSION;  Surgeon: Josue Hector, MD;  Location: [facility] ENDOSCOPY;  Service: Cardiovascular;  Laterality: N/A;  ? CERVICAL [place]    ? IR CT HEAD LTD  03/12/2021  ? IR PERCUTANEOUS ART THROMBECTOMY/INFUSION INTRACRANIAL INC DIAG ANGIO  03/12/2021  ? IR US GUIDE VASC ACCESS RIGHT  03/15/2021  ? LEG SURGERY    ? right leg and ankle  ? NECK SURGERY    ? cyst removed  ? RADIOLOGY WITH ANESTHESIA N/A 03/12/2021  ? Procedure: RADIOLOGY WITH ANESTHESIA;  Surgeon: Radiologist, Medication, MD;  Location: [place];  Service: Radiology;  Laterality: N/A;  ? TEE WITHOUT CARDIOVERSION N/A 02/05/2019  ? Procedure: TRANSESOPHAGEAL ECHOCARDIOGRAM (TEE);  Surgeon: Josue Hector, MD;  Location: [facility] ENDOSCOPY;  Service: Cardiovascular;  Laterality: N/A;  ? TOTAL HIP ARTHROPLASTY    ? left  ? ? ?There were no vitals filed for this visit. ? ? Subjective Assessment - 09/15/21 1538   ? ? Subjective Exercises  are going well.  Has been feeling better, less achy.   ? Pertinent History MRI+acute infarct at the right basal ganglia. Few and  punctate bifrontal peripheral infarcts. Recent medial left thalamic infarct which may be subacute. 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  L THA.   ? Limitations Standing;Walking   ? How long can you sit comfortably? >15 min   ? How long can you stand comfortably? <15 min   ? How long can you walk comfortably? <15 min   ? Patient Stated Goals To become more steady on his feet and return to outside ambulation   ? Currently in Pain? No/denies   ? Pain Onset More than a month ago   ? ?  ?  ? ?  ? ? ? ? [place] Adult PT Treatment/Exercise - 09/15/21 1540   ? ?  ? Transfers  ? Transfers Floor to Transfer   ? Floor to Transfer 5: Supervision;3: Mod assist   ? Floor to Transfer Details (indicate cue type and reason) Supervision to stand from tall kneeling > half kneeling with LLE forwards, RUE on mat x 3-4 reps.  When attempting to stand from half kneeling with RLE forwards, unable due to weakness in R knee   ?  ? Knee/Hip Exercises:  Standing  ? Lateral Step Up Right;Left;2 sets;10 reps;Hand Hold: 0;Step Height: 4";Step Height: 6";Limitations   ? Lateral Step Up Limitations holding 8lb with bilat UE, alternating L and R   ? Forward Step Up Right;Left;2 sets;10 reps;Hand Hold: 0;Step Height: 4";Step Height: 6";Limitations   ? Forward Step Up Limitations holding 8lb in bilat UE   ? Functional Squat 2 sets;10 reps;Limitations   ? Functional Squat Limitations from mat, bilat UE holding 8lb.  First set sit <> stand, second set squat without sitting all the way down on mat   ? ?  ?  ? ?  ? ? ? ? PT Short Term Goals - 08/18/21 1130   ? ?  ? PT SHORT TERM GOAL #1  ? Title = LTG (set at 4 weeks, reset to 8 weeks)   ? ?  ?  ? ?  ? ? ? ? PT Long Term Goals - 08/18/21 1131   ? ?  ? PT LONG TERM GOAL #1  ? Title Patient to decreased 5x STS time to <17s  w/o UE assist  (assess at 4 weeks 3/23 and then again at 8 weeks 4/20)   ? Baseline 23 seconds due to rib pain today   ? Time 4   ? Period Weeks   ? Status Revised   ? Target Date 09/29/21   ?  ? PT LONG TERM GOAL #2  ? Title Patient to increase score of FGA to 23/30   ? Baseline 21/30 without AD, no change, rib pain today   ? Time 4   ? Period Weeks   ? Status Revised   ? Target Date 09/29/21   ?  ? PT LONG TERM GOAL #3  ? Title Patient to negotiate 16 steps with alternting pattern w/o UE support, MOD I   ? Baseline 16 steps, alternating with UE support on rails   ? Time 4   ? Period Weeks   ? Status Revised   ? Target Date 09/29/21   ?  ? PT LONG TERM GOAL #4  ? Title Pt will demonstrate ability to transition floor <> standing, MOD I without use of UE on furniture   ? Baseline not able to assess due to rib pain   ? Time 4   ? Period Weeks   ? Status Revised   ? Target Date 09/29/21   ?  ? PT LONG TERM GOAL #5  ? Title Increase FOTO score to 60   ? Baseline Initial score 53 > 55   ? Time 4   ? Period Weeks   ? Status Revised   ? Target Date 09/29/21   ? ?  ?  ? ?  ? ? ? ? ? ? ? ? Plan - 09/15/21 1609   ? ? Clinical Impression Statement Treatment session focused on functional body weight exercises for extensor strengthening.  Pt reported increased R knee pain with lateral step downs.  When performing floor > stand transfers pt able to stand with LLE and one UE with supervision.  Still not able to stand without UE support or with RLE forwards due to pain and weakness in R knee.  Will continue to address and progress towards LTG.   ? Comorbidities CVA, L THA   ? Rehab Potential Good   ? PT Frequency 1x / week   ? PT Duration Other (comment)   10 weeks total, 2 weeks on hold due to rib injury, 8 weeks of treatment  ?  PT Treatment/Interventions ADLs/Self Care Home Management;Aquatic Therapy;Gait training;Stair training;Functional mobility training;Therapeutic activities;Balance training;Neuromuscular  re-education;Therapeutic exercise;Patient/family education;Cryotherapy;Moist Heat;Orthotic Fit/Training;Passive range of motion   ? PT Next Visit Plan Getting up off the floor without UE support.  Stairs, alternating sequence, increased use of LLE.  Standing balance on compliant surfaces(trampoline/foam/rockerboard), EC.  higher level gait and balance training, stairs without UE support/obstacles/curb.   ? PT Home Exercise Plan [id number]   ? Consulted and Agree with Plan of Care Patient   ? ?  ?  ? ?  ? ? ?Patient will benefit from skilled therapeutic intervention in order to improve the following deficits and impairments:  Abnormal gait, Difficulty walking, Decreased endurance, Decreased activity tolerance, Decreased balance, Decreased strength ? ?Visit Diagnosis: ?Unsteadiness on feet ? ?Difficulty in walking, not elsewhere classified ? ?Muscle weakness (generalized) ? ?Foot drop, left ? ? ? ? ?Problem List ?Patient Active Problem List  ? Diagnosis Date Noted  ? Chronic left shoulder pain 06/22/2021  ? Dysphagia   ? Leucocytosis   ? Benign essential HTN   ? Acute gout of right knee   ? Acute ischemic right MCA stroke ([place]) 03/12/2021  ? Alcohol abuse   ? Seizure ([place])   ? Essential hypertension 12/26/2019  ? Longstanding persistent atrial fibrillation ([place])   ? Atrial fibrillation with RVR ([place]) 02/03/2019  ? Hyperlipidemia 02/03/2019  ? Anxiety 02/03/2019  ? ? ?Rico Junker, PT, DPT ?09/15/21    4:16 PM ? ? ? ?[place] ?[place] ?[place], [place], [address] ?Phone: [phone]   Fax:  [phone] ? ?Name: Patrick Padilla ?MRN: [id number] ?Date of Birth: 1955-10-02 ? ? ? ?

## 2021-09-19 ENCOUNTER — Ambulatory Visit: Payer: Medicare Other | Admitting: Physical Therapy

## 2021-09-19 ENCOUNTER — Other Ambulatory Visit: Payer: Self-pay

## 2021-09-19 DIAGNOSIS — M6281 Muscle weakness (generalized): Secondary | ICD-10-CM

## 2021-09-19 DIAGNOSIS — R2681 Unsteadiness on feet: Secondary | ICD-10-CM

## 2021-09-19 DIAGNOSIS — R262 Difficulty in walking, not elsewhere classified: Secondary | ICD-10-CM

## 2021-09-19 DIAGNOSIS — M21372 Foot drop, left foot: Secondary | ICD-10-CM | POA: Diagnosis not present

## 2021-09-19 NOTE — Therapy (Deleted)
?OUTPATIENT PHYSICAL THERAPY TREATMENT NOTE ? ? ?Patient Name: Patrick Padilla ?MRN: [id number] ?DOB:1955/08/30, 66 y.o., male ?Today's Date: 09/19/2021 ? ?PCP: Dorna Mai, MD ?REFERRING PROVIDER: Lavon Paganini Angiulli, PA-C  ? ? ? ?Past Medical History:  ?Diagnosis Date  ? Anxiety   ? Elevated LFTs   ? History of colon polyps   ? HLD (hyperlipidemia)   ? HTN (hypertension)   ? Localized superficial swelling, mass, or lump   ? ?Past Surgical History:  ?Procedure Laterality Date  ? CARDIOVERSION N/A 02/05/2019  ? Procedure: CARDIOVERSION;  Surgeon: Josue Hector, MD;  Location[facility] ENDOSCOPY;  Service: Cardiovascular;  Laterality: N/A;  ? CERVICAL [place]    ? IR CT HEAD LTD  03/12/2021  ? IR PERCUTANEOUS ART THROMBECTOMY/INFUSION INTRACRANIAL INC DIAG ANGIO  03/12/2021  ? IR US GUIDE VASC ACCESS RIGHT  03/15/2021  ? LEG SURGERY    ? right leg and ankle  ? NECK SURGERY    ? cyst removed  ? RADIOLOGY WITH ANESTHESIA N/A 03/12/2021  ? Procedure: RADIOLOGY WITH ANESTHESIA;  Surgeon: Radiologist, Medication, MD;  Location: [place];  Service: Radiology;  Laterality: N/A;  ? TEE WITHOUT CARDIOVERSION N/A 02/05/2019  ? Procedure: TRANSESOPHAGEAL ECHOCARDIOGRAM (TEE);  Surgeon: Josue Hector, MD;  Location[facility] ENDOSCOPY;  Service: Cardiovascular;  Laterality: N/A;  ? TOTAL HIP ARTHROPLASTY    ? left  ? ?Patient Active Problem List  ? Diagnosis Date Noted  ? Chronic left shoulder pain 06/22/2021  ? Dysphagia   ? Leucocytosis   ? Benign essential HTN   ? Acute gout of right knee   ? Acute ischemic right MCA stroke ([place]) 03/12/2021  ? Alcohol abuse   ? Seizure ([place])   ? Essential hypertension 12/26/2019  ? Longstanding persistent atrial fibrillation ([place])   ? Atrial fibrillation with RVR ([place]) 02/03/2019  ? Hyperlipidemia 02/03/2019  ? Anxiety 02/03/2019  ? ? ?REFERRING DIAG: I63.511 (ICD-10-CM) - Cerebral infarction due to unspecified occlusion or stenosis of right middle cerebral artery  ? ?THERAPY DIAG:  ?No diagnosis  found. ? ?PERTINENT HISTORY: MRI+acute infarct at the right basal ganglia. Few and punctate bifrontal peripheral infarcts. Recent medial left thalamic infarct which may be subacute. 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 L THA.  ? ?PRECAUTIONS: No driving, no smoking, no drinking, no heavy lifting  ? ?SUBJECTIVE: *** ? ?PAIN:  ?Are you having pain? {OPRCPAIN:27236} ? ? ? ? ?TODAY'S TREATMENT:  ?*** ? ? ?PATIENT EDUCATION: ?Education details: *** ?Person educated: {Person educated:25204} ?Education method: {Education Method:25205} ?Education comprehension: {Education Comprehension:25206} ? ? ?HOME EXERCISE PROGRAM: ?*** ? ? PT Short Term Goals - 08/18/21 1130   ? ?  ? PT SHORT TERM GOAL #1  ? Title = LTG (set at 4 weeks, reset to 8 weeks)   ? ?  ?  ? ?  ? ? ? PT Long Term Goals - 08/18/21 1131   ? ?  ? PT LONG TERM GOAL #1  ? Title Patient to decreased 5x STS time to <17s w/o UE assist  (assess at 4 weeks 3/23 and then again at 8 weeks 4/20)   ? Baseline 23 seconds due to rib pain today   ? Time 4   ? Period Weeks   ? Status Revised   ? Target Date 09/29/21   ?  ? PT LONG TERM GOAL #2  ? Title Patient to increase score of FGA to 23/30   ? Baseline 21/30 without AD, no  change, rib pain today   ? Time 4   ? Period Weeks   ? Status Revised   ? Target Date 09/29/21   ?  ? PT LONG TERM GOAL #3  ? Title Patient to negotiate 16 steps with alternting pattern w/o UE support, MOD I   ? Baseline 16 steps, alternating with UE support on rails   ? Time 4   ? Period Weeks   ? Status Revised   ? Target Date 09/29/21   ?  ? PT LONG TERM GOAL #4  ? Title Pt will demonstrate ability to transition floor <> standing, MOD I without use of UE on furniture   ? Baseline not able to assess due to rib pain   ? Time 4   ? Period Weeks   ? Status Revised   ? Target Date 09/29/21   ?  ? PT LONG TERM GOAL #5  ? Title Increase FOTO score to 60   ? Baseline Initial score 53 > 55   ? Time 4    ? Period Weeks   ? Status Revised   ? Target Date 09/29/21   ? ?  ?  ? ?  ? ?Plan    ?  ?  Clinical Impression Statement   ?  Comorbidities CVA, L THA   ?  Rehab Potential Good   ?  PT Frequency 1x / week   ?  PT Duration Other (comment)   10 weeks total, 2 weeks on hold due to rib injury, 8 weeks of treatment  ?  PT Treatment/Interventions ADLs/Self Care Home Management;Aquatic Therapy;Gait training;Stair training;Functional mobility training;Therapeutic activities;Balance training;Neuromuscular re-education;Therapeutic exercise;Patient/family education;Cryotherapy;Moist Heat;Orthotic Fit/Training;Passive range of motion   ?  PT Next Visit Plan Getting up off the floor without UE support.  Stairs, alternating sequence, increased use of LLE.  Standing balance on compliant surfaces(trampoline/foam/rockerboard), EC.  higher level gait and balance training, stairs without UE support/obstacles/curb.   ?  PT Home Exercise Plan [email or web address]   ?  Consulted and Agree with Plan of Care Patient   ? ?Rico Junker, PT, DPT ?09/19/21    11:41 AM ? ?[place] ?[place] ?[place], [place], [address] ?Phone: [phone]   Fax:  [phone] ? ? ? ? ? ? ?   ? ? ?

## 2021-09-19 NOTE — Therapy (Signed)
[facility] [address] [place], [place], [address] Phone: [phone]   Fax:  [phone]  Physical Therapy Treatment  Patient Details  Name: Patrick Padilla MRN: [id number] Date of Birth: [birth year] Referring Provider (PT): Cathlyn Parsons, PA-C   Encounter Date: 09/19/2021   PT End of Session - 09/19/21 1539     Visit Number 22    Number of Visits 27    Date for PT Re-Evaluation 10/27/21    Authorization Type MCR/MCD    Progress Note Due on Visit 29    PT Start Time 1539    PT Stop Time 1616    PT Time Calculation (min) 37 min    Activity Tolerance Patient tolerated treatment well    Behavior During Therapy WFL for tasks assessed/performed             Past Medical History:  Diagnosis Date   Anxiety    Elevated LFTs    History of colon polyps    HLD (hyperlipidemia)    HTN (hypertension)    Localized superficial swelling, mass, or lump     Past Surgical History:  Procedure Laterality Date   CARDIOVERSION N/A 02/05/2019   Procedure: CARDIOVERSION;  Surgeon: Josue Hector, MD;  Location: [place];  Service: Cardiovascular;  Laterality: N/A;   CERVICAL [place] SURGERY     IR CT HEAD LTD  03/12/2021   IR PERCUTANEOUS ART THROMBECTOMY/INFUSION INTRACRANIAL INC DIAG ANGIO  03/12/2021   IR US GUIDE VASC ACCESS RIGHT  03/15/2021   LEG SURGERY     right leg and ankle   NECK SURGERY     cyst removed   RADIOLOGY WITH ANESTHESIA N/A 03/12/2021   Procedure: RADIOLOGY WITH ANESTHESIA;  Surgeon: Radiologist, Medication, MD;  Location: [place];  Service: Radiology;  Laterality: N/A;   TEE WITHOUT CARDIOVERSION N/A 02/05/2019   Procedure: TRANSESOPHAGEAL ECHOCARDIOGRAM (TEE);  Surgeon: Josue Hector, MD;  Location: [facility] ENDOSCOPY;  Service: Cardiovascular;  Laterality: N/A;   TOTAL HIP ARTHROPLASTY     left    There were no vitals filed for this visit.   Subjective Assessment - 09/19/21 1546     Subjective Was a  little sore after last session but didn't last.  Knee is feeling okay.    Pertinent History MRI+acute infarct at the right basal ganglia. Few and  punctate bifrontal peripheral infarcts. Recent medial left thalamic infarct which may be subacute. 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  L THA.    Limitations Standing;Walking    How long can you sit comfortably? >15 min    How long can you stand comfortably? <15 min    How long can you walk comfortably? <15 min    Patient Stated Goals To become more steady on his feet and return to outside ambulation    Currently in Pain? No/denies    Pain Onset More than a month ago               [facility] Adult PT Treatment/Exercise - 09/19/21 1547       Ambulation/Gait   Stairs Yes    Stairs Assistance 5: Supervision;4: Min guard    Stairs Assistance Details (indicate cue type and reason) without use of UE on rails x 3 reps with close supervision.  Had pt hold 4kg medicine ball in UE to simulate carrying wood into house; performed up/down stairs carrying ball x 2 sets with close supervision, min guard  Stair Management Technique No rails;Alternating pattern;Forwards    Number of Stairs 16    Height of Stairs 6    Gait Comments Functional gait to simulate carrying and stacking wood: walking x 20' squatting down to pick up 4kg ball, carry 20' and lift up and place over head and then reverse task x 8 reps total.      Knee/Hip Exercises: Standing   Forward Step Up Right;Left;1 set;10 reps;Hand Hold: 0;Step Height: 6";Limitations    Forward Step Up Limitations at stairs, with tap to next step with contralateral LE; min A to prevent posterior LOB              Balance Exercises - 09/19/21 1555       Balance Exercises: Standing   Standing Eyes Opened Narrow base of support (BOS);Head turns;Solid surface;Other reps (comment);Limitations;Foam/compliant surface    Standing Eyes Opened  Limitations Standing with one foot on bottom step, other foot on second step (staggered stance) performed 10 reps head turns, 10 reps head nods.  Performed with R then L foot forwards with min A for balance.  Then performed standing with feet together, facing up and down ramp with mat over ramp while performing 10 reps head turns and head nods.    Standing Eyes Closed Narrow base of support (BOS);Head turns;Foam/compliant surface;Other reps (comment);Limitations    Standing Eyes Closed Limitations feet together, facing up and down ramp, mat over ramp.  10 reps head turns and 10 reps head nods.                PT Short Term Goals - 08/18/21 1130       PT SHORT TERM GOAL #1   Title = LTG (set at 4 weeks, reset to 8 weeks)               PT Long Term Goals - 08/18/21 1131       PT LONG TERM GOAL #1   Title Patient to decreased 5x STS time to <17s w/o UE assist  (assess at 4 weeks 3/23 and then again at 8 weeks 4/20)    Baseline 23 seconds due to rib pain today    Time 4    Period Weeks    Status Revised    Target Date 09/29/21      PT LONG TERM GOAL #2   Title Patient to increase score of FGA to 23/30    Baseline 21/30 without AD, no change, rib pain today    Time 4    Period Weeks    Status Revised    Target Date 09/29/21      PT LONG TERM GOAL #3   Title Patient to negotiate 16 steps with alternting pattern w/o UE support, MOD I    Baseline 16 steps, alternating with UE support on rails    Time 4    Period Weeks    Status Revised    Target Date 09/29/21      PT LONG TERM GOAL #4   Title Pt will demonstrate ability to transition floor <> standing, MOD I without use of UE on furniture    Baseline not able to assess due to rib pain    Time 4    Period Weeks    Status Revised    Target Date 09/29/21      PT LONG TERM GOAL #5   Title Increase FOTO score to 60    Baseline Initial score 53 > 55    Time  4    Period Weeks    Status Revised    Target Date 09/29/21                    Plan - 09/19/21 1632     Clinical Impression Statement Continued to focus on functional LE strengthening by incorporating into gait, stair negotiation, and simulated "wood stacking" task.  Continued to challenge patient's balance on uneven and compliant surfaces with no evidence of LOB today even with vision removed.  Pt also demonstrating safer gait with decreased foot slap or foot drag.  No knee pain today.  Will continue to progress towards LTG.    Comorbidities CVA, L THA    Rehab Potential Good    PT Frequency 1x / week    PT Duration Other (comment)   10 weeks total, 2 weeks on hold due to rib injury, 8 weeks of treatment   PT Treatment/Interventions ADLs/Self Care Home Management;Aquatic Therapy;Gait training;Stair training;Functional mobility training;Therapeutic activities;Balance training;Neuromuscular re-education;Therapeutic exercise;Patient/family education;Cryotherapy;Moist Heat;Orthotic Fit/Training;Passive range of motion    PT Next Visit Plan Getting up off the floor without UE support.  Stairs, alternating sequence, increased use of LLE.  Standing balance on compliant surfaces(trampoline/foam/rockerboard), EC.  higher level gait and balance training, stairs without UE support/obstacles/curb.    PT Home Exercise Plan [facility]    Consulted and Agree with Plan of Care Patient             Patient will benefit from skilled therapeutic intervention in order to improve the following deficits and impairments:  Abnormal gait, Difficulty walking, Decreased endurance, Decreased activity tolerance, Decreased balance, Decreased strength  Visit Diagnosis: Unsteadiness on feet  Difficulty in walking, not elsewhere classified  Muscle weakness (generalized)  Foot drop, left     Problem List Patient Active Problem List   Diagnosis Date Noted   Chronic left shoulder pain 06/22/2021   Dysphagia    Leucocytosis    Benign essential HTN    Acute gout  of right knee    Acute ischemic right MCA stroke ([place]) 03/12/2021   Alcohol abuse    Seizure ([place])    Essential hypertension 12/26/2019   Longstanding persistent atrial fibrillation ([place])    Atrial fibrillation with RVR ([place]) 02/03/2019   Hyperlipidemia 02/03/2019   Anxiety 02/03/2019    Rico Junker, PT, DPT 09/19/21    4:35 PM    [place] [address] [place], [place], [address] Phone: [phone]   Fax:  [phone]  Name: Patrick Padilla MRN: [id number] Date of Birth: 04-Jan-1956

## 2021-09-20 ENCOUNTER — Ambulatory Visit: Payer: Medicare Other | Admitting: Adult Health

## 2021-09-20 ENCOUNTER — Encounter: Payer: Self-pay | Admitting: Adult Health

## 2021-09-21 ENCOUNTER — Encounter: Payer: Self-pay | Admitting: Physical Medicine & Rehabilitation

## 2021-09-21 ENCOUNTER — Other Ambulatory Visit: Payer: Self-pay

## 2021-09-21 ENCOUNTER — Encounter: Payer: Self-pay | Admitting: Adult Health

## 2021-09-21 ENCOUNTER — Encounter: Payer: Medicare Other | Attending: Physical Medicine & Rehabilitation | Admitting: Physical Medicine & Rehabilitation

## 2021-09-21 ENCOUNTER — Ambulatory Visit: Payer: Medicare Other | Admitting: Adult Health

## 2021-09-21 VITALS — BP 142/92 | HR 84 | Ht 72.0 in | Wt 198.4 lb

## 2021-09-21 VITALS — BP 160/101 | HR 90 | Temp 97.0°F | Ht 72.0 in | Wt 197.0 lb

## 2021-09-21 DIAGNOSIS — M25512 Pain in left shoulder: Secondary | ICD-10-CM | POA: Diagnosis not present

## 2021-09-21 DIAGNOSIS — I63411 Cerebral infarction due to embolism of right middle cerebral artery: Secondary | ICD-10-CM

## 2021-09-21 DIAGNOSIS — G8929 Other chronic pain: Secondary | ICD-10-CM | POA: Insufficient documentation

## 2021-09-21 DIAGNOSIS — I63511 Cerebral infarction due to unspecified occlusion or stenosis of right middle cerebral artery: Secondary | ICD-10-CM | POA: Diagnosis not present

## 2021-09-21 MED ORDER — BACLOFEN 10 MG PO TABS
10.0000 mg | ORAL_TABLET | Freq: Two times a day (BID) | ORAL | 2 refills | Status: DC | PRN
Start: 2021-09-21 — End: 2021-12-30

## 2021-09-21 NOTE — Patient Instructions (Addendum)
PLEASE FEEL FREE TO CALL OUR OFFICE WITH ANY PROBLEMS OR QUESTIONS [phone]) ? ?REMEMBER TO USE GOOD WALKING MECHANICS, KEEPING EVEN WEIGHT ON YOUR LEFT AND RIGHT.  ? ?KEEP BUILDING UP STRENGTH AND STAMINA ? ? ?                ?

## 2021-09-21 NOTE — Progress Notes (Signed)
? ?Subjective:  ? ? Patient ID: Patrick Padilla, male    DOB: 05-07-1956, 66 y.o.   MRN: [id number] ? ?HPI ? ?Mr. Gunsch is here in follow up of his cva and left shoulder pain. He had good results with therapy on his shoulder. He as regained rom where he can moves his arm over head with little problem. .  ? ?His right knee is causing him some concern now over the past couple months. He feels some popping and cracking but pain is minimal. He doesn't have any swelling. He feels that his left side is still weak and can give out on him. However, he has noticed improfements in his stamina for activities in and outside the home. Only a month or so ago, he would have to sit after 5 minutes walking in the store. Now, he no longer needs to do so. He denies any falls. ? ?His mood has been up beat. He's sleeping well. Bowel and bladder habits are normal.  ? ? ?Pain Inventory ?Average Pain 0 ?Pain Right Now 0 ?My pain is  no pain ? ?LOCATION OF PAIN   n/a ? ?BOWEL ?Number of stools per week: 7 ? ?BLADDER ?Normal ? ?Difficulty starting stream  weak stream ? ? ? ?Mobility ?ability to climb steps?  yes ?do you drive?  yes ? ?Function ?disabled: date disabled 2018 ?retired ? ?Neuro/Psych ?weakness ?anxiety ? ?Prior Studies ?Any changes since last visit?  no ? ?Physicians involved in your care ?Any changes since last visit?  no ? ? ?Family History  ?Problem Relation Age of Onset  ? Hepatitis Mother   ? Clotting disorder Mother   ? Stroke Mother   ? Thyroid disease Mother   ? Colon cancer Father   ?     ?  ? Rheumatic fever Brother   ? Heart disease Brother   ? ?Social History  ? ?Socioeconomic History  ? Marital status: Married  ?  Spouse name: Not on file  ? Number of children: 2  ? Years of education: Not on file  ? Highest education level: Not on file  ?Occupational History  ? Occupation: disabled  ?  Employer: SELF-EMPLOYED  ?Tobacco Use  ? Smoking status: Every Day  ? Smokeless tobacco: Never  ?Vaping Use  ? Vaping Use: Never used   ?Substance and Sexual Activity  ? Alcohol use: Yes  ?  Comment: 3-4 a day  ? Drug use: No  ? Sexual activity: Not on file  ?Other Topics Concern  ? Not on file  ?Social History Narrative  ? Not on file  ? ?Social Determinants of Health  ? ?Financial Resource Strain: Not on file  ?Food Insecurity: Not on file  ?Transportation Needs: Not on file  ?Physical Activity: Not on file  ?Stress: Not on file  ?Social Connections: Not on file  ? ?Past Surgical History:  ?Procedure Laterality Date  ? CARDIOVERSION N/A 02/05/2019  ? Procedure: CARDIOVERSION;  Surgeon: Josue Hector, MD;  Location: [facility] ENDOSCOPY;  Service: Cardiovascular;  Laterality: N/A;  ? CERVICAL [place]    ? IR CT HEAD LTD  03/12/2021  ? IR PERCUTANEOUS ART THROMBECTOMY/INFUSION INTRACRANIAL INC DIAG ANGIO  03/12/2021  ? IR US GUIDE VASC ACCESS RIGHT  03/15/2021  ? LEG SURGERY    ? right leg and ankle  ? NECK SURGERY    ? cyst removed  ? RADIOLOGY WITH ANESTHESIA N/A 03/12/2021  ? Procedure: RADIOLOGY WITH ANESTHESIA;  Surgeon: Radiologist, Medication, MD;  Location: [place];  Service: Radiology;  Laterality: N/A;  ? TEE WITHOUT CARDIOVERSION N/A 02/05/2019  ? Procedure: TRANSESOPHAGEAL ECHOCARDIOGRAM (TEE);  Surgeon: Josue Hector, MD;  Location: [facility] ENDOSCOPY;  Service: Cardiovascular;  Laterality: N/A;  ? TOTAL HIP ARTHROPLASTY    ? left  ? ?Past Medical History:  ?Diagnosis Date  ? Anxiety   ? Elevated LFTs   ? History of colon polyps   ? HLD (hyperlipidemia)   ? HTN (hypertension)   ? Localized superficial swelling, mass, or lump   ? ?Ht 6' (1.829 m)   Wt 197 lb (89.4 kg)   BMI 26.72 kg/m?  ? ?Opioid Risk Score:   ?Fall Risk Score:  `1 ? ?Depression screen PHQ 2/9 ? ?Depression screen [facility] 2/9 06/22/2021 05/16/2021 04/13/2021  ?Decreased Interest 0 2 2  ?Down, Depressed, Hopeless 0 1 1  ?PHQ - 2 Score 0 3 3  ?Altered sleeping - 3 3  ?Tired, decreased energy - 2 2  ?Change in appetite - 2 2  ?Feeling bad or failure about yourself  - 2 0  ?Trouble  concentrating - 2 1  ?Moving slowly or fidgety/restless - 0 1  ?Suicidal thoughts - 0 0  ?PHQ-9 Score - 14 12  ?Difficult doing work/chores - Somewhat difficult -  ? ? ?Review of Systems  ?Musculoskeletal:   ?     Left leg & left hip weakness  ?All other systems reviewed and are negative. ? ?   ?Objective:  ? Physical Exam ?General: No acute distress ?HEENT: NCAT, EOMI, oral membranes moist ?Cards: reg rate  ?Chest: normal effort ?Abdomen: Soft, NT, ND ?Skin: dry, intact ?Extremities: no edema ?Psych: pleasant and appropriate  ?Skin: Clean and intact without signs of breakdown ?Neuro:   Patient is alert and oriented x3.  Demonstrates reasonable insight and awareness. Still some slight delays in processing. Cranial nerve exam is unremarkable.  Strength is grossly 4+ to 5 out of 5 both upper extremities.  Lower extremity strength is also 4+ to 5 out of 5.  Hew might be a tick weaker on the left but this is negligible No gross sensory abnormalities today.  No pronator drift.  Romberg improved, sl wobbly to left. Improved weight shift to left during gait. Cerebellar exam is unremarkable. ?Musculoskeletal: he has normal, painless ROM in left shoulder. Knee is non-tender ?  ?  ?  ?  ?  ?Assessment & Plan:  ?Medical Problem List and Plan: ?1.  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             -continue HEP and exercises to improve stamina and balance ? -he has made really nice gains. S  ?2.  Antithrombotics: ?-DVT/anticoagulation:   Eliquis               ?3. Left shoulder/right knee pain/Pain Management: Local measures with Voltaren gel/ice/heat.  ?            -at baseline! ?-continue HEP ?4. Mood:  paxil ?            -seems to be in good spirits ?5.  H/o A fib s/p CV:   Eliquis   ?            -rate controlled ?8. HTN:  -improved control, normotensive ?-Lopressor  ?             norvasc  10mg  ?            -needs better control --didn't take  meds today however ? -he'll follow up with his primary ? -reinforced  the need to regularly take his meds! ?  ?Fifteen minutes of face to face patient care time were spent during this visit. All questions were encouraged and answered.  Follow up with me PRN ? ? ? ?  ? ? ?

## 2021-09-21 NOTE — Patient Instructions (Addendum)
Keep up the good work!! You are making excellent progress working with therapies!  ? ?Continue Eliquis (apixaban) daily  and atorvastatin for secondary stroke prevention ? ?Continue to follow up with PCP regarding cholesterol and blood pressure management  ?Maintain strict control of hypertension with blood pressure goal below 130/90 and cholesterol with LDL cholesterol (bad cholesterol) goal below 70 mg/dL.  ? ?Signs of a Stroke? Follow the BEFAST method:  ?Balance Watch for a sudden loss of balance, trouble with coordination or vertigo ?Eyes Is there a sudden loss of vision in one or both eyes? Or double vision?  ?Face: Ask the person to smile. Does one side of the face droop or is it numb?  ?Arms: Ask the person to raise both arms. Does one arm drift downward? Is there weakness or numbness of a leg? ?Speech: Ask the person to repeat a simple phrase. Does the speech sound slurred/strange? Is the person confused ? ?Time: If you observe any of these signs, call 911. ? ? ? ? ? ? ?Thank you for coming to see [place] at [facility]. I hope we have been able to provide you high quality care today. ? ?You may receive a patient satisfaction survey over the next few weeks. We would appreciate your feedback and comments so that we may continue to improve ourselves and the health of our patients. ? ?

## 2021-09-21 NOTE — Progress Notes (Signed)
?Patrick Padilla ?[address] ?[place]. Patrick Pad[place] [address] ?[phone] ? ?     STROKE FOLLOW UP NOTE ? ?[name] ?Date of Birth:  Apr 01, 1956 ?Medical Record Number:  [id number]  ? ?Reason for Referral:  stroke follow up ? ? ? ?SUBJECTIVE: ? ? ?CHIEF COMPLAINT:  ?Chief Complaint  ?Patient presents with  ? Cerebrovascular Accident  ?  Rm 2, 4 month FU  "no new concerns, therapy has done me wonders"  ? ? ?HPI:  ? ?Update 09/21/2021 JM: Patient returns for 63-month stroke follow-up.  Overall doing well reporting excellent recovery working with therapies. Continue mild weakness on left side more noticeable when going up stairs, ambulating on uneven surfaces or prolonged ambulation although notes continued gradual improvement.  Reports great improvement of left shoulder pain and improved range of motion.  Continues to work with therapies.  Denies new stroke/TIA symptoms.  Compliant on Eliquis and atorvastatin, denies side effects.  Blood pressure today elevated but has not yet taken BP meds today.  No new concerns at this time. ? ? ? ? ?History provided for reference purposes only ?Initial visit 05/23/2021 JM: Patient being seen for hospital follow-up unaccompanied.  Doing well since discharge currently working with therapies with gradual recovery. Reports continued mild left sided weakness, left shoulder pain, lower back pain - pain gradually improving working with therapies. Occasional dizziness sensation upon standing. Living with son, daughter-in-law and grandbaby - arrangement PTA. He requests ability to return back to driving.  Denies new stroke/TIA symptoms.  Compliant on Eliquis and atorvastatin -denies side effects.  Blood pressure today 106/61. Recent med adjustments by PCP for hypotension (decreased amlodipine dosage from 10 mg to 5 mg daily).  No further concerns at this time ? ?Stroke admission note 322 ?Patrick Padilla is a 66 y.o. patient with PMHx of A. fib s/p CVA in the past, and EtOH  abuse who was admitted on 03/12/2021 with confusion, chest pain and diaphoresis.  He was found to be in A. fib with RVR in route to ED via EMG.  At admission he was hemiplegic, obtunded with hypoxic respiratory failure requiring intubation and right-sided jerking movements.  Personally reviewed hospitalization pertinent progress notes, lab work and imaging.  Evaluated by Dr. Erlinda Hong for right MCA stroke due to proximal right M1/MCA occlusion s/p IR with TICI 3 reperfusion, embolic likely due to A. Fib s/p cardioversion. MR brain also showed PRES appearance in bilateral parieto-occipital cortex.  EF 60 to 65%.  EEG no seizure or epileptiform discharges. LTM EEG moderate diffuse encephalopathy.  LDL 97.  A1c 5.2.  Unclear if on Eliquis PTA - recommended starting Eliquis 5 mg twice daily for stroke prevention and atrial fibrillation.  Initiate atorvastatin 40 mg daily.  EtOH and tobacco use with cessation counseling provided.  Residual deficits of left-sided weakness. D/c'd to CIR for ongoing therapy needs . ? ? ? ? ? ?PERTINENT IMAGING/LABS ? ? ?CT Possible subtle loss of gray-white differentiation in R lentiform nucleus and bilateral thalami  ?CTA head & neck Emergent LVO of the right M1 segment with poor collaterals. ?CT perfusion: R MCA infarct core 53ml with penumbra of 162ml. ?IR 9/3 Proximal right M1/MCA occlusion s/p mechanical thrombectomy with TICI3) ?MRI Confluent acute infarct at the right basal ganglia. Few and punctate bifrontal peripheral infarcts. Recent medial left thalamic infarct which may be subacute. PRES appearance in the bilateral parietooccipital cortex. Small volume blood products at the right sylvian fissure without evidence of progression from prior CT. ?2D  Echo EF 60-65%  ?EEG spot: no seizure or seizure predisposition recorded on this study.  ?LTM EEG This study is suggestive of moderate diffuse encephalopathy ?LDL 97 ?HgbA1c 5.2 ? ? ? ?ROS:   ?14 system review of systems performed and negative  with exception of those listed in HPI ? ?PMH:  ?Past Medical History:  ?Diagnosis Date  ? Anxiety   ? Elevated LFTs   ? History of colon polyps   ? HLD (hyperlipidemia)   ? HTN (hypertension)   ? Localized superficial swelling, mass, or lump   ? Stroke [facility])   ? ? ?PSH:  ?Past Surgical History:  ?Procedure Laterality Date  ? CARDIOVERSION N/A 02/05/2019  ? Procedure: CARDIOVERSION;  Surgeon: Wendall Stade, MD;  Location: [facility] ENDOSCOPY;  Service: Cardiovascular;  Laterality: N/A;  ? CERVICAL [place]    ? IR CT HEAD LTD  03/12/2021  ? IR PERCUTANEOUS ART THROMBECTOMY/INFUSION INTRACRANIAL INC DIAG ANGIO  03/12/2021  ? IR US GUIDE VASC ACCESS RIGHT  03/15/2021  ? LEG SURGERY    ? right leg and ankle  ? NECK SURGERY    ? cyst removed  ? RADIOLOGY WITH ANESTHESIA N/A 03/12/2021  ? Procedure: RADIOLOGY WITH ANESTHESIA;  Surgeon: Radiologist, Medication, MD;  Location: [facility] OR;  Service: Radiology;  Laterality: N/A;  ? TEE WITHOUT CARDIOVERSION N/A 02/05/2019  ? Procedure: TRANSESOPHAGEAL ECHOCARDIOGRAM (TEE);  Surgeon: Wendall Stade, MD;  Location: [facility] ENDOSCOPY;  Service: Cardiovascular;  Laterality: N/A;  ? TOTAL HIP ARTHROPLASTY    ? left  ? ? ?Social History:  ?Social History  ? ?Socioeconomic History  ? Marital status: Married  ?  Spouse name: Not on file  ? Number of children: 2  ? Years of education: Not on file  ? Highest education level: Not on file  ?Occupational History  ? Occupation: disabled  ?  Employer: SELF-EMPLOYED  ?Tobacco Use  ? Smoking status: Every Day  ? Smokeless tobacco: Never  ?Vaping Use  ? Vaping Use: Never used  ?Substance and Sexual Activity  ? Alcohol use: Yes  ?  Comment: 3-4 a day  ? Drug use: No  ? Sexual activity: Not on file  ?Other Topics Concern  ? Not on file  ?Social History Narrative  ? Not on file  ? ?Social Determinants of Health  ? ?Financial Resource Strain: Not on file  ?Food Insecurity: Not on file  ?Transportation Needs: Not on file  ?Physical Activity: Not on file  ?Stress: Not  on file  ?Social Connections: Not on file  ?Intimate Partner Violence: Not on file  ? ? ?Family History:  ?Family History  ?Problem Relation Age of Onset  ? Hepatitis Mother   ? Clotting disorder Mother   ? Stroke Mother   ? Thyroid disease Mother   ? Colon cancer Father   ?     ?  ? Rheumatic fever Brother   ? Heart disease Brother   ? ? ?Medications:   ?Current Outpatient Medications on File Prior to Visit  ?Medication Sig Dispense Refill  ? apixaban (ELIQUIS) 5 MG TABS tablet Take 1 tablet (5 mg total) by mouth 2 (two) times daily. 60 tablet 3  ? atorvastatin (LIPITOR) 40 MG tablet Take 1 tablet (40 mg total) by mouth daily. 30 tablet 3  ? baclofen (LIORESAL) 10 MG tablet Take 1 tablet (10 mg total) by mouth 2 (two) times daily as needed for muscle spasms. 60 each 2  ? folic acid (FOLVITE) 1 MG  tablet Take 1 tablet (1 mg total) by mouth daily. 30 tablet 3  ? irbesartan (AVAPRO) 75 MG tablet Take 1 tablet (75 mg total) by mouth daily. 30 tablet 3  ? metoprolol tartrate (LOPRESSOR) 100 MG tablet Take 1 tablet (100 mg total) by mouth 2 (two) times daily. 60 tablet 1  ? Multiple Vitamin (MULTIVITAMIN WITH MINERALS) TABS tablet Take 1 tablet by mouth daily. 30 tablet 3  ? PARoxetine (PAXIL) 30 MG tablet Take 1 tablet by mouth once daily 90 tablet 0  ? telmisartan (MICARDIS) 40 MG tablet Take 40 mg by mouth daily.    ? ?No current facility-administered medications on file prior to visit.  ? ? ?Allergies:  No Known Allergies ? ? ? ?OBJECTIVE: ? ?Physical Exam ? ?Vitals:  ? 09/21/21 1352 09/21/21 1423  ?BP: (!) 152/93 (!) 142/92  ?Pulse: 84   ?Weight: 198 lb 6.4 oz (90 kg)   ?Height: 6' (1.829 m)   ? ?Body mass index is 26.91 kg/m?Marland Kitchen ?No results found. ? ?General: well developed, well nourished, very pleasant middle-aged Caucasian male, seated, in no evident distress ?Head: head normocephalic and atraumatic.   ?Neck: supple with no carotid or supraclavicular bruits ?Cardiovascular: regular rate and rhythm, no  murmurs ?Musculoskeletal: no deformity ?Skin:  no rash/petichiae ?Vascular:  Normal pulses all extremities ?  ?Neurologic Exam ?Mental Status: Awake and fully alert. Fluent speech and language. Oriented to place and

## 2021-09-23 ENCOUNTER — Ambulatory Visit: Payer: Medicare Other | Admitting: Physical Therapy

## 2021-09-26 ENCOUNTER — Other Ambulatory Visit: Payer: Self-pay

## 2021-09-26 ENCOUNTER — Ambulatory Visit: Payer: Medicare Other | Admitting: Physical Therapy

## 2021-09-26 DIAGNOSIS — R262 Difficulty in walking, not elsewhere classified: Secondary | ICD-10-CM | POA: Diagnosis not present

## 2021-09-26 DIAGNOSIS — M21372 Foot drop, left foot: Secondary | ICD-10-CM

## 2021-09-26 DIAGNOSIS — M6281 Muscle weakness (generalized): Secondary | ICD-10-CM | POA: Diagnosis not present

## 2021-09-26 DIAGNOSIS — R2681 Unsteadiness on feet: Secondary | ICD-10-CM | POA: Diagnosis not present

## 2021-09-26 NOTE — Patient Instructions (Signed)
Access Code: [email or web address] ?URL: [email or web address] ?Date: 09/09/2021 ?Prepared by: Misty Stanley ?

## 2021-09-26 NOTE — Therapy (Signed)
?OUTPATIENT PHYSICAL THERAPY TREATMENT NOTE ? ? ?Patient Name: Patrick Padilla ?MRN: [id number] ?DOB:[birth year], 66 y.o., male ?Today's Date: 09/26/2021 ? ?PCP: Dorna Mai, MD ?Referring Provider (PT): Lavon Paganini Angiulli, PA-C ? ? PT End of Session - 09/26/21 1153   ? ? Visit Number 23   ? Number of Visits 27   ? Date for PT Re-Evaluation 10/27/21   ? Authorization Type MCR/MCD   ? Progress Note Due on Visit 29   ? PT Start Time 1150   ? PT Stop Time 1235   ? PT Time Calculation (min) 45 min   ? Activity Tolerance Patient tolerated treatment well   ? Behavior During Therapy [facility] for tasks assessed/performed   ? ?  ?  ? ?  ? ? ?Past Medical History:  ?Diagnosis Date  ? Anxiety   ? Elevated LFTs   ? History of colon polyps   ? HLD (hyperlipidemia)   ? HTN (hypertension)   ? Localized superficial swelling, mass, or lump   ? Stroke [facility])   ? ?Past Surgical History:  ?Procedure Laterality Date  ? CARDIOVERSION N/A 02/05/2019  ? Procedure: CARDIOVERSION;  Surgeon: Josue Hector, MD;  Location: [facility] ENDOSCOPY;  Service: Cardiovascular;  Laterality: N/A;  ? CERVICAL [place]    ? IR CT HEAD LTD  03/12/2021  ? IR PERCUTANEOUS ART THROMBECTOMY/INFUSION INTRACRANIAL INC DIAG ANGIO  03/12/2021  ? IR US GUIDE VASC ACCESS RIGHT  03/15/2021  ? LEG SURGERY    ? right leg and ankle  ? NECK SURGERY    ? cyst removed  ? RADIOLOGY WITH ANESTHESIA N/A 03/12/2021  ? Procedure: RADIOLOGY WITH ANESTHESIA;  Surgeon: Radiologist, Medication, MD;  Location: [place];  Service: Radiology;  Laterality: N/A;  ? TEE WITHOUT CARDIOVERSION N/A 02/05/2019  ? Procedure: TRANSESOPHAGEAL ECHOCARDIOGRAM (TEE);  Surgeon: Josue Hector, MD;  Location: [facility] ENDOSCOPY;  Service: Cardiovascular;  Laterality: N/A;  ? TOTAL HIP ARTHROPLASTY    ? left  ? ?Patient Active Problem List  ? Diagnosis Date Noted  ? Chronic left shoulder pain 06/22/2021  ? Dysphagia   ? Leucocytosis   ? Benign essential HTN   ? Acute gout of right knee   ? Acute ischemic right MCA stroke ([place])  03/12/2021  ? Alcohol abuse   ? Seizure ([place])   ? Essential hypertension 12/26/2019  ? Longstanding persistent atrial fibrillation ([place])   ? Atrial fibrillation with RVR ([place]) 02/03/2019  ? Hyperlipidemia 02/03/2019  ? Anxiety 02/03/2019  ? ? ?REFERRING DIAG: I63.511 (ICD-10-CM) - Cerebral infarction due to unspecified occlusion or stenosis of right middle cerebral artery ? ?THERAPY DIAG:  ?Unsteadiness on feet ? ?Difficulty in walking, not elsewhere classified ? ?Muscle weakness (generalized) ? ?Foot drop, left ? ?PERTINENT HISTORY: MRI+acute infarct at the right basal ganglia. Few and  punctate bifrontal peripheral infarcts. Recent medial left thalamic infarct which may be subacute. 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  L THA.  ? ?PRECAUTIONS: Fall ? ?SUBJECTIVE: Pt doing well; did load wood into wood stove and got down on the ground to start it.  Had to hold knobs to be able to stand back up. ? ?PAIN:  ?Are you having pain?  R knee pain ? ? ?TODAY'S TREATMENT:  ? ?09/26/2021: ?Continued stair negotiation and LE strength training:  2 sets x 10 reps standing on foam performing larger step ups on second step with LLE with tap to next step with RLE with light UE support.  Standing on foam performed 2 sets x 10 reps alternating taps to step with bilat UE support > one UE support > no UE support with PT providing min A.  Performed standing on foam with one foot on second step, holding balance while performing 10 reps head turns and 10 head nods with each foot forwards with PT providing min A for balance. ? ?To address ability to stand from floor: Performed blocked practice staggered stance static lunges with one UE support on elevated mat, one foot forwards and other LE/knee lowering to 3 blue foam pads > standing with supervision with R foot forwards x 10 reps, when L foot forwards pt able to perform 6 reps but then experienced L knee buckling with fatigue.   Performed standing <> floor with UE support on mat through half kneeling; able to stand with each LE forwards but pt using 50-60% UE to perform safely. ? ?Dynamic gait/balance in // bars: 2 laps down and back each: tandem gait and then high knee marching forwards beginning with bilat UE support > no/intermittent UE support.  Min A for balance. ? ? ?PATIENT EDUCATION: ?Education details: Assess LTG next visit; may be ready for D/C ?Person educated: Patient ?Education method: Explanation ?Education comprehension: verbalized understanding ? ? ?HOME EXERCISE PROGRAM: ?Access Code: [place] ?URL: [email or web address] ?Date: 09/09/2021 ?Prepared by: Misty Stanley ? ? ? PT Short Term Goals - 08/18/21 1130   ? ?  ? PT SHORT TERM GOAL #1  ? Title = LTG (set at 4 weeks, reset to 8 weeks)   ? ?  ?  ? ?  ? ? ? PT Long Term Goals - 08/18/21 1131   ? ?  ? PT LONG TERM GOAL #1  ? Title Patient to decreased 5x STS time to <17s w/o UE assist  (assess at 4 weeks 3/23 and then again at 8 weeks 4/20)   ? Baseline 23 seconds due to rib pain today   ? Time 4   ? Period Weeks   ? Status Revised   ? Target Date 09/29/21   ?  ? PT LONG TERM GOAL #2  ? Title Patient to increase score of FGA to 23/30   ? Baseline 21/30 without AD, no change, rib pain today   ? Time 4   ? Period Weeks   ? Status Revised   ? Target Date 09/29/21   ?  ? PT LONG TERM GOAL #3  ? Title Patient to negotiate 16 steps with alternting pattern w/o UE support, MOD I   ? Baseline 16 steps, alternating with UE support on rails   ? Time 4   ? Period Weeks   ? Status Revised   ? Target Date 09/29/21   ?  ? PT LONG TERM GOAL #4  ? Title Pt will demonstrate ability to transition floor <> standing, MOD I without use of UE on furniture   ? Baseline not able to assess due to rib pain   ? Time 4   ? Period Weeks   ? Status Revised   ? Target Date 09/29/21   ?  ? PT LONG TERM GOAL #5  ? Title Increase FOTO score to 60   ? Baseline Initial score 53 > 55   ? Time 4   ?  Period Weeks   ? Status Revised   ? Target Date 09/29/21   ? ?  ?  ? ?  ? ?Plan   ? ? Clinical Impression Statement Continued  to address LE weakness and balance impairments with functional, body weight exercises to improve safety with stair negotiation and floor <> stand exercises.  Continues to require significant UE support for floor > stand due to R knee pain and LLE weakness.  Will assess LTG next session to determine if pt is ready for D/C.    ? Comorbidities CVA, L THA   ? Rehab Potential Good   ? PT Frequency 1x / week   ? PT Duration Other (comment)   10 weeks total, 2 weeks on hold due to rib injury, 8 weeks of treatment  ? PT Treatment/Interventions ADLs/Self Care Home Management;Aquatic Therapy;Gait training;Stair training;Functional mobility training;Therapeutic activities;Balance training;Neuromuscular re-education;Therapeutic exercise;Patient/family education;Cryotherapy;Moist Heat;Orthotic Fit/Training;Passive range of motion   ? PT Next Visit Plan Check LTG, D/C?  ? PT Home Exercise Plan [email or web address]   ? Consulted and Agree with Plan of Care Patient   ? ?  ?  ? ?Rico Junker, PT, DPT ?09/26/21    12:51 PM ? ?[place] ?[address] ?[place], [place], [address] ?Phone: [phone]   Fax:  [phone] ? ? ? ? ? ? ? ?   ? ?

## 2021-09-28 ENCOUNTER — Other Ambulatory Visit: Payer: Self-pay | Admitting: Physical Medicine & Rehabilitation

## 2021-09-28 ENCOUNTER — Other Ambulatory Visit: Payer: Self-pay | Admitting: Family Medicine

## 2021-10-03 ENCOUNTER — Other Ambulatory Visit: Payer: Self-pay

## 2021-10-03 ENCOUNTER — Ambulatory Visit: Payer: Medicare Other | Admitting: Physical Therapy

## 2021-10-03 DIAGNOSIS — R2681 Unsteadiness on feet: Secondary | ICD-10-CM

## 2021-10-03 DIAGNOSIS — R262 Difficulty in walking, not elsewhere classified: Secondary | ICD-10-CM

## 2021-10-03 DIAGNOSIS — M21372 Foot drop, left foot: Secondary | ICD-10-CM

## 2021-10-03 DIAGNOSIS — M6281 Muscle weakness (generalized): Secondary | ICD-10-CM

## 2021-10-03 NOTE — Therapy (Signed)
?OUTPATIENT PHYSICAL THERAPY TREATMENT NOTE ?AND DISCHARGE SUMMARY ? ? ?Patient Name: Patrick Padilla ?MRN: [id number] ?DOB:15-Apr-1956, 66 y.o., male ?Today's Date: 10/03/2021 ? ?PCP: Dorna Mai, MD ?Referring Provider (PT): Lavon Paganini Angiulli, PA-C ? ? ?PHYSICAL THERAPY DISCHARGE SUMMARY ? ?Visits from Start of Care: 24 ? ?Current functional level related to goals / functional outcomes: ?See LTG achievement and impression statement below. ?  ?Remaining deficits: ?Mild LE weakness ?  ?Education / Equipment: ?HEP  ? ?Patient agrees to discharge. Patient goals were met. Patient is being discharged due to meeting the stated rehab goals. ? ?Rico Junker, PT, DPT ?10/03/21    1:01 PM ? ? ? ? ? PT End of Session - 10/03/21 1106   ? ? Visit Number 24   ? Number of Visits 27   ? Date for PT Re-Evaluation 10/27/21   ? Authorization Type MCR/MCD   ? Progress Note Due on Visit 29   ? PT Start Time 1106   ? PT Stop Time 1145   ? PT Time Calculation (min) 39 min   ? Activity Tolerance Patient tolerated treatment well   ? Behavior During Therapy [facility] for tasks assessed/performed   ? ?  ?  ? ?  ? ? ? ?Past Medical History:  ?Diagnosis Date  ? Anxiety   ? Elevated LFTs   ? History of colon polyps   ? HLD (hyperlipidemia)   ? HTN (hypertension)   ? Localized superficial swelling, mass, or lump   ? Stroke [facility])   ? ?Past Surgical History:  ?Procedure Laterality Date  ? CARDIOVERSION N/A 02/05/2019  ? Procedure: CARDIOVERSION;  Surgeon: Josue Hector, MD;  Location: [facility] ENDOSCOPY;  Service: Cardiovascular;  Laterality: N/A;  ? CERVICAL [place]    ? IR CT HEAD LTD  03/12/2021  ? IR PERCUTANEOUS ART THROMBECTOMY/INFUSION INTRACRANIAL INC DIAG ANGIO  03/12/2021  ? IR US GUIDE VASC ACCESS RIGHT  03/15/2021  ? LEG SURGERY    ? right leg and ankle  ? NECK SURGERY    ? cyst removed  ? RADIOLOGY WITH ANESTHESIA N/A 03/12/2021  ? Procedure: RADIOLOGY WITH ANESTHESIA;  Surgeon: Radiologist, Medication, MD;  Location: [place];  Service: Radiology;   Laterality: N/A;  ? TEE WITHOUT CARDIOVERSION N/A 02/05/2019  ? Procedure: TRANSESOPHAGEAL ECHOCARDIOGRAM (TEE);  Surgeon: Josue Hector, MD;  Location: [facility] ENDOSCOPY;  Service: Cardiovascular;  Laterality: N/A;  ? TOTAL HIP ARTHROPLASTY    ? left  ? ?Patient Active Problem List  ? Diagnosis Date Noted  ? Chronic left shoulder pain 06/22/2021  ? Dysphagia   ? Leucocytosis   ? Benign essential HTN   ? Acute gout of right knee   ? Acute ischemic right MCA stroke ([place]) 03/12/2021  ? Alcohol abuse   ? Seizure ([place])   ? Essential hypertension 12/26/2019  ? Longstanding persistent atrial fibrillation ([place])   ? Atrial fibrillation with RVR ([place]) 02/03/2019  ? Hyperlipidemia 02/03/2019  ? Anxiety 02/03/2019  ? ? ?REFERRING DIAG: I63.511 (ICD-10-CM) - Cerebral infarction due to unspecified occlusion or stenosis of right middle cerebral artery ? ?THERAPY DIAG:  ?Unsteadiness on feet ? ?Difficulty in walking, not elsewhere classified ? ?Muscle weakness (generalized) ? ?Foot drop, left ? ?PERTINENT HISTORY: MRI+acute infarct at the right basal ganglia. Few and  punctate bifrontal peripheral infarcts. Recent medial left thalamic infarct which may be subacute. 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  L THA.  ? ?PRECAUTIONS: Fall ? ?SUBJECTIVE:  ? ?  PAIN:  ?Are you having pain?  L mid/low back; feels like a "strain" thinks he may have "over done it" working on son's truck.   ? ? ?TODAY'S TREATMENT:  ? ?10/03/2021: ? [facility] PT Assessment - 10/03/21 1109   ? ?  ? Assessment  ? Medical Diagnosis I63.511 (ICD-10-CM) - Cerebral infarction due to unspecified occlusion or stenosis of right middle cerebral artery   ? Referring Provider (PT) Lavon Paganini Angiulli, PA-C   ? Onset Date/Surgical Date 03/12/21   ? Hand Dominance Right   ? Next MD Visit 05/16/21   ? Prior Therapy CIR   ?  ? Precautions  ? Precautions Other (comment)   ? Precaution Comments No driving, no smoking, no drinking, no  heavy lifting   ?  ? Prior Function  ? Level of Independence Independent   ?  ? Observation/Other Assessments  ? Focus on Therapeutic Outcomes (FOTO)  LE Stroke: 73 increased from 48   ?  ? Transfers  ? Transfers Floor to Transfer   ? Floor to Transfer 6: Modified independent (Device/Increase time)   ? Floor to Transfer Details (indicate cue type and reason) with RLE forwards and LLE, able to perform through half kneeling with bilat and one UE support.  Not able to perform without UE support.   ?  ? Ambulation/Gait  ? Stairs Yes   ? Stairs Assistance 6: Modified independent (Device/Increase time)   ? Stairs Assistance Details (indicate cue type and reason) no knee pain today   ? Stair Management Technique No rails;Alternating pattern;Forwards   ? Number of Stairs 16   ? Height of Stairs 6   ?  ? Standardized Balance Assessment  ? Standardized Balance Assessment Five Times Sit to Stand   ? Five times sit to stand comments  14 seconds from chair without use of UE; equal WB through bilat LE   ?  ? Functional Gait  Assessment  ? Gait assessed  Yes   ? Gait Level Surface Walks 20 ft in less than 7 sec but greater than 5.5 sec, uses assistive device, slower speed, mild gait deviations, or deviates 6-10 in outside of the 12 in walkway width.   ? Change in Gait Speed Able to smoothly change walking speed without loss of balance or gait deviation. Deviate no more than 6 in outside of the 12 in walkway width.   ? Gait with Horizontal Head Turns Performs head turns smoothly with no change in gait. Deviates no more than 6 in outside 12 in walkway width   ? Gait with Vertical Head Turns Performs head turns with no change in gait. Deviates no more than 6 in outside 12 in walkway width.   ? Gait and Pivot Turn Pivot turns safely within 3 sec and stops quickly with no loss of balance.   ? Step Over Obstacle Is able to step over 2 stacked shoe boxes taped together (9 in total height) without changing gait speed. No evidence of  imbalance.   ? Gait with Narrow Base of Support Ambulates 7-9 steps.   ? Gait with Eyes Closed Walks 20 ft, slow speed, abnormal gait pattern, evidence for imbalance, deviates 10-15 in outside 12 in walkway width. Requires more than 9 sec to ambulate 20 ft.   ? Ambulating Backwards Walks 20 ft, uses assistive device, slower speed, mild gait deviations, deviates 6-10 in outside 12 in walkway width.   ? Steps Alternating feet, no rail.   ? Total Score 25   ?  FGA comment: 25/30   ? ?  ?  ? ?  ? ? ?09/26/2021: ?Continued stair negotiation and LE strength training:  2 sets x 10 reps standing on foam performing larger step ups on second step with LLE with tap to next step with RLE with light UE support.  Standing on foam performed 2 sets x 10 reps alternating taps to step with bilat UE support > one UE support > no UE support with PT providing min A.  Performed standing on foam with one foot on second step, holding balance while performing 10 reps head turns and 10 head nods with each foot forwards with PT providing min A for balance. ? ?To address ability to stand from floor: Performed blocked practice staggered stance static lunges with one UE support on elevated mat, one foot forwards and other LE/knee lowering to 3 blue foam pads > standing with supervision with R foot forwards x 10 reps, when L foot forwards pt able to perform 6 reps but then experienced L knee buckling with fatigue.  Performed standing <> floor with UE support on mat through half kneeling; able to stand with each LE forwards but pt using 50-60% UE to perform safely. ? ?Dynamic gait/balance in // bars: 2 laps down and back each: tandem gait and then high knee marching forwards beginning with bilat UE support > no/intermittent UE support.  Min A for balance. ? ? ?PATIENT EDUCATION: ?Education details: Goals met; D/C today; continue with HEP ?Person educated: Patient ?Education method: Explanation ?Education comprehension: verbalized  understanding ? ? ?HOME EXERCISE PROGRAM: ?Access Code: [place] ?URL: [email or web address] ?Date: 09/09/2021 ?Prepared by: Misty Stanley ? ? ? PT Short Term Goals - 08/18/21 1130   ? ?  ? PT SHORT TERM GOAL #1  ?

## 2021-10-13 ENCOUNTER — Ambulatory Visit: Payer: Medicare HMO | Admitting: Physical Therapy

## 2021-10-17 ENCOUNTER — Ambulatory Visit: Payer: Medicare HMO | Admitting: Physical Therapy

## 2021-10-24 ENCOUNTER — Ambulatory Visit: Payer: Medicare HMO | Admitting: Physical Therapy

## 2021-11-14 ENCOUNTER — Other Ambulatory Visit: Payer: Self-pay | Admitting: Physical Medicine & Rehabilitation

## 2021-12-13 ENCOUNTER — Encounter: Payer: Self-pay | Admitting: Family Medicine

## 2021-12-30 ENCOUNTER — Other Ambulatory Visit: Payer: Self-pay | Admitting: Physical Medicine & Rehabilitation

## 2021-12-30 DIAGNOSIS — I63511 Cerebral infarction due to unspecified occlusion or stenosis of right middle cerebral artery: Secondary | ICD-10-CM

## 2021-12-30 DIAGNOSIS — G8929 Other chronic pain: Secondary | ICD-10-CM

## 2022-01-06 MED ORDER — APIXABAN 5 MG PO TABS
5.0000 mg | ORAL_TABLET | Freq: Two times a day (BID) | ORAL | 3 refills | Status: DC
Start: 2022-01-06 — End: 2022-07-19

## 2022-03-03 ENCOUNTER — Other Ambulatory Visit: Payer: Self-pay | Admitting: Family Medicine

## 2022-03-03 NOTE — Telephone Encounter (Signed)
Pt called, unable to leave VM d/t mailbox full. Pt is due for appt with PCP.

## 2022-03-03 NOTE — Telephone Encounter (Signed)
Requested medication (s) are due for refill today: yes  Requested medication (s) are on the active medication list: yes  Last refill:  01/06/22 #60/0  Future visit scheduled: no  Notes to clinic:  pt is due for appt, called VM full.      Requested Prescriptions  Pending Prescriptions Disp Refills   metoprolol tartrate (LOPRESSOR) 100 MG tablet [Pharmacy Med Name: Metoprolol Tartrate 100 MG Oral Tablet] 60 tablet 0    Sig: Take 1 tablet by mouth twice daily     Cardiovascular:  Beta Blockers Failed - 03/03/2022  3:09 PM      Failed - Last BP in normal range    BP Readings from Last 1 Encounters:  09/21/21 (!) 142/92         Failed - Valid encounter within last 6 months    Recent Outpatient Visits           9 months ago Essential hypertension   [facility] at [facility], Lauris Poag, MD              Passed - Last Heart Rate in normal range    Pulse Readings from Last 1 Encounters:  09/21/21 84

## 2022-03-07 ENCOUNTER — Other Ambulatory Visit: Payer: Self-pay | Admitting: Family Medicine

## 2022-03-20 ENCOUNTER — Other Ambulatory Visit: Payer: Self-pay | Admitting: Physical Medicine & Rehabilitation

## 2022-04-12 DIAGNOSIS — Z0100 Encounter for examination of eyes and vision without abnormal findings: Secondary | ICD-10-CM | POA: Diagnosis not present

## 2022-04-12 DIAGNOSIS — H524 Presbyopia: Secondary | ICD-10-CM | POA: Diagnosis not present

## 2022-04-22 ENCOUNTER — Other Ambulatory Visit: Payer: Self-pay | Admitting: Physical Medicine & Rehabilitation

## 2022-04-22 ENCOUNTER — Other Ambulatory Visit: Payer: Self-pay | Admitting: Family Medicine

## 2022-04-22 DIAGNOSIS — I63511 Cerebral infarction due to unspecified occlusion or stenosis of right middle cerebral artery: Secondary | ICD-10-CM

## 2022-04-22 DIAGNOSIS — G8929 Other chronic pain: Secondary | ICD-10-CM

## 2022-04-25 ENCOUNTER — Other Ambulatory Visit: Payer: Self-pay | Admitting: Family Medicine

## 2022-04-26 NOTE — Telephone Encounter (Signed)
Unable to refill per protocol, appointment needed. Medication was refused due to OV needed. Duplicate request, will refuse.   Requested Prescriptions  Pending Prescriptions Disp Refills  . ELIQUIS 5 MG TABS tablet [Pharmacy Med Name: Eliquis 5 MG Oral Tablet] 60 tablet 0    Sig: Take 1 tablet by mouth twice daily     There is no refill protocol information for this order

## 2022-04-28 ENCOUNTER — Telehealth: Payer: Self-pay

## 2022-04-28 DIAGNOSIS — G8929 Other chronic pain: Secondary | ICD-10-CM

## 2022-04-28 DIAGNOSIS — I63511 Cerebral infarction due to unspecified occlusion or stenosis of right middle cerebral artery: Secondary | ICD-10-CM

## 2022-04-28 MED ORDER — BACLOFEN 10 MG PO TABS: 10.0000 mg | ORAL_TABLET | Freq: Two times a day (BID) | ORAL | 2 refills | Status: DC

## 2022-04-28 NOTE — Telephone Encounter (Signed)
Refill request for Baclofen. One refill, any further need to go to PCP

## 2022-06-29 ENCOUNTER — Other Ambulatory Visit: Payer: Self-pay | Admitting: Physical Medicine & Rehabilitation

## 2022-06-29 ENCOUNTER — Other Ambulatory Visit: Payer: Self-pay | Admitting: Family Medicine

## 2022-07-09 ENCOUNTER — Other Ambulatory Visit: Payer: Self-pay | Admitting: Family Medicine

## 2022-07-12 ENCOUNTER — Other Ambulatory Visit: Payer: Self-pay | Admitting: Family Medicine

## 2022-07-14 NOTE — Addendum Note (Signed)
Addended by: Matilde Sprang on: 07/14/2022 04:27 PM   Modules accepted: Orders

## 2022-07-14 NOTE — Telephone Encounter (Signed)
Requested medication (s) are due for refill today: Yes  Requested medication (s) are on the active medication list: Yes  Last refill:  01/06/22  Future visit scheduled: Yes  Notes to clinic:  Unable to refill per protocol due to failed labs, no updated results, appointment needed. Patient asking for refills until upcoming appointment.     Requested Prescriptions  Pending Prescriptions Disp Refills   apixaban (ELIQUIS) 5 MG TABS tablet 60 tablet 3    Sig: Take 1 tablet (5 mg total) by mouth 2 (two) times daily.     Hematology:  Anticoagulants - apixaban Failed - 07/14/2022  4:27 PM      Failed - PLT in normal range and within 360 days    Platelets  Date Value Ref Range Status  03/28/2021 563 (H) 150 - 400 K/uL Final         Failed - HGB in normal range and within 360 days    Hemoglobin  Date Value Ref Range Status  03/28/2021 16.7 13.0 - 17.0 g/dL Final         Failed - HCT in normal range and within 360 days    HCT  Date Value Ref Range Status  03/28/2021 50.3 39.0 - 52.0 % Final         Failed - Cr in normal range and within 360 days    Creatinine, Ser  Date Value Ref Range Status  04/02/2021 1.09 0.61 - 1.24 mg/dL Final         Failed - AST in normal range and within 360 days    AST  Date Value Ref Range Status  03/18/2021 14 (L) 15 - 41 U/L Final         Failed - ALT in normal range and within 360 days    ALT  Date Value Ref Range Status  03/18/2021 16 0 - 44 U/L Final         Failed - Valid encounter within last 12 months    Recent Outpatient Visits           1 year ago Essential hypertension   Primary [facility] at [facility], MD       Future Appointments             In 2 weeks Georganna Skeans, MD Primary Care at [facility]             metoprolol tartrate (LOPRESSOR) 100 MG tablet 60 tablet 0    Sig: Take 1 tablet (100 mg total) by mouth 2 (two) times daily.     Cardiovascular:  Beta Blockers Failed - 07/14/2022  4:27 PM       Failed - Last BP in normal range    BP Readings from Last 1 Encounters:  09/21/21 (!) 142/92         Failed - Valid encounter within last 6 months    Recent Outpatient Visits           1 year ago Essential hypertension   Primary Care at [facility], MD       Future Appointments             In 2 weeks Georganna Skeans, MD [facility] Care at [facility] - Last Heart Rate in normal range    Pulse Readings from Last 1 Encounters:  09/21/21 84          PARoxetine (PAXIL)  30 MG tablet 90 tablet 0     Psychiatry:  Antidepressants - SSRI Failed - 07/14/2022  4:27 PM      Failed - Valid encounter within last 6 months    Recent Outpatient Visits           1 year ago Essential hypertension   Primary Care at [facility], MD       Future Appointments             In 2 weeks Dorna Mai, MD Primary Care at [facility]            Refused Prescriptions Disp Refills   PARoxetine (PAXIL) 30 MG tablet [Pharmacy Med Name: PARoxetine HCl 30 MG Oral Tablet] 90 tablet 0    Sig: TAKE 1 TABLET BY MOUTH ONCE DAILY . APPOINTMENT REQUIRED FOR FUTURE REFILLS     Psychiatry:  Antidepressants - SSRI Failed - 07/14/2022  4:27 PM      Failed - Valid encounter within last 6 months    Recent Outpatient Visits           1 year ago Essential hypertension   Primary Care at [facility], MD       Future Appointments             In 2 weeks Dorna Mai, MD Primary Care at [facility] 5 MG TABS tablet [Pharmacy Med Name: Eliquis 5 MG Oral Tablet] 60 tablet 0    Sig: Take 1 tablet by mouth twice daily     Hematology:  Anticoagulants - apixaban Failed - 07/14/2022  4:27 PM      Failed - PLT in normal range and within 360 days    Platelets  Date Value Ref Range Status  03/28/2021 563 (H) 150 - 400 K/uL Final         Failed - HGB in normal range and within 360 days     Hemoglobin  Date Value Ref Range Status  03/28/2021 16.7 13.0 - 17.0 g/dL Final         Failed - HCT in normal range and within 360 days    HCT  Date Value Ref Range Status  03/28/2021 50.3 39.0 - 52.0 % Final         Failed - Cr in normal range and within 360 days    Creatinine, Ser  Date Value Ref Range Status  04/02/2021 1.09 0.61 - 1.24 mg/dL Final         Failed - AST in normal range and within 360 days    AST  Date Value Ref Range Status  03/18/2021 14 (L) 15 - 41 U/L Final         Failed - ALT in normal range and within 360 days    ALT  Date Value Ref Range Status  03/18/2021 16 0 - 44 U/L Final         Failed - Valid encounter within last 12 months    Recent Outpatient Visits           1 year ago Essential hypertension   Primary Care at [facility], MD       Future Appointments             In 2 weeks Dorna Mai, MD Primary Care at [facility]             metoprolol tartrate (  LOPRESSOR) 100 MG tablet [Pharmacy Med Name: Metoprolol Tartrate 100 MG Oral Tablet] 60 tablet 0    Sig: Take 1 tablet by mouth twice daily     Cardiovascular:  Beta Blockers Failed - 07/14/2022  4:27 PM      Failed - Last BP in normal range    BP Readings from Last 1 Encounters:  09/21/21 (!) 142/92         Failed - Valid encounter within last 6 months    Recent Outpatient Visits           1 year ago Essential hypertension   Primary Care at [facility], MD       Future Appointments             In 2 weeks Dorna Mai, MD Primary Care at [place] in normal range    Pulse Readings from Last 1 Encounters:  09/21/21 84

## 2022-07-14 NOTE — Telephone Encounter (Signed)
Pt called to schedule the next available, says he has been out for a while and wants enough to last him until his scheduled time

## 2022-07-17 ENCOUNTER — Other Ambulatory Visit: Payer: Self-pay | Admitting: Family Medicine

## 2022-07-19 ENCOUNTER — Ambulatory Visit (INDEPENDENT_AMBULATORY_CARE_PROVIDER_SITE_OTHER): Payer: Medicare HMO | Admitting: Family Medicine

## 2022-07-19 ENCOUNTER — Encounter: Payer: Self-pay | Admitting: Family Medicine

## 2022-07-19 VITALS — BP 120/73 | HR 96 | Temp 98.1°F | Resp 16 | Ht 72.0 in | Wt 190.6 lb

## 2022-07-19 DIAGNOSIS — L989 Disorder of the skin and subcutaneous tissue, unspecified: Secondary | ICD-10-CM

## 2022-07-19 DIAGNOSIS — R69 Illness, unspecified: Secondary | ICD-10-CM | POA: Diagnosis not present

## 2022-07-19 DIAGNOSIS — F5105 Insomnia due to other mental disorder: Secondary | ICD-10-CM | POA: Diagnosis not present

## 2022-07-19 DIAGNOSIS — F419 Anxiety disorder, unspecified: Secondary | ICD-10-CM

## 2022-07-19 DIAGNOSIS — M109 Gout, unspecified: Secondary | ICD-10-CM | POA: Insufficient documentation

## 2022-07-19 DIAGNOSIS — Z1211 Encounter for screening for malignant neoplasm of colon: Secondary | ICD-10-CM | POA: Insufficient documentation

## 2022-07-19 DIAGNOSIS — F17200 Nicotine dependence, unspecified, uncomplicated: Secondary | ICD-10-CM

## 2022-07-19 DIAGNOSIS — E7800 Pure hypercholesterolemia, unspecified: Secondary | ICD-10-CM

## 2022-07-19 DIAGNOSIS — Z800 Family history of malignant neoplasm of digestive organs: Secondary | ICD-10-CM | POA: Insufficient documentation

## 2022-07-19 DIAGNOSIS — F99 Mental disorder, not otherwise specified: Secondary | ICD-10-CM

## 2022-07-19 DIAGNOSIS — I10 Essential (primary) hypertension: Secondary | ICD-10-CM

## 2022-07-19 MED ORDER — APIXABAN 5 MG PO TABS: 5.0000 mg | ORAL_TABLET | Freq: Two times a day (BID) | ORAL | 5 refills | Status: DC

## 2022-07-19 MED ORDER — TELMISARTAN 40 MG PO TABS: 40.0000 mg | ORAL_TABLET | Freq: Every day | ORAL | 1 refills | Status: DC

## 2022-07-19 MED ORDER — MIRTAZAPINE 15 MG PO TABS: 15.0000 mg | ORAL_TABLET | Freq: Every day | ORAL | 1 refills | Status: DC

## 2022-07-19 NOTE — Progress Notes (Unsigned)
Patient is here for med refill. Patient has not been her x34yr and need to speak with pcp about some issues he is having

## 2022-07-20 ENCOUNTER — Encounter: Payer: Self-pay | Admitting: Family Medicine

## 2022-07-20 NOTE — Progress Notes (Addendum)
Established Patient Office Visit  Subjective    Patient ID: Patrick Padilla, male    DOB: [birth year]  Age: 67 y.o. MRN: [id number]  CC:  Chief Complaint  Patient presents with   Medication Refill    HPI Patrick Padilla presents for routine follow up of chronic med issues.    Outpatient Encounter Medications as of 07/19/2022  Medication Sig   baclofen (LIORESAL) 10 MG tablet Take 1 tablet (10 mg total) by mouth 2 (two) times daily.   mirtazapine (REMERON) 15 MG tablet Take 1 tablet (15 mg total) by mouth at bedtime.   Multiple Vitamin (MULTIVITAMIN WITH MINERALS) TABS tablet Take 1 tablet by mouth daily.   PARoxetine (PAXIL) 30 MG tablet TAKE 1 TABLET BY MOUTH ONCE DAILY . APPOINTMENT REQUIRED FOR FUTURE REFILLS   [DISCONTINUED] telmisartan (MICARDIS) 40 MG tablet Take 40 mg by mouth daily.   apixaban (ELIQUIS) 5 MG TABS tablet Take 1 tablet (5 mg total) by mouth 2 (two) times daily.   folic acid (FOLVITE) 1 MG tablet Take 1 tablet (1 mg total) by mouth daily.   telmisartan (MICARDIS) 40 MG tablet Take 1 tablet (40 mg total) by mouth daily.   [DISCONTINUED] apixaban (ELIQUIS) 5 MG TABS tablet Take 1 tablet (5 mg total) by mouth 2 (two) times daily. (Patient not taking: Reported on 07/19/2022)   [DISCONTINUED] apixaban (ELIQUIS) 5 MG TABS tablet Take 1 tablet by mouth 2 (two) times daily.   [DISCONTINUED] atorvastatin (LIPITOR) 40 MG tablet Take 1 tablet (40 mg total) by mouth daily.   [DISCONTINUED] irbesartan (AVAPRO) 75 MG tablet Take 1 tablet (75 mg total) by mouth daily.   [DISCONTINUED] metoprolol tartrate (LOPRESSOR) 100 MG tablet Take 1 tablet by mouth twice daily   No facility-administered encounter medications on file as of 07/19/2022.    Past Medical History:  Diagnosis Date   Anxiety    Elevated LFTs    History of colon polyps    HLD (hyperlipidemia)    HTN (hypertension)    Localized superficial swelling, mass, or lump    Stroke [facility])     Past Surgical History:   Procedure Laterality Date   CARDIOVERSION N/A 02/05/2019   Procedure: CARDIOVERSION;  Surgeon: Josue Hector, MD;  Location: [place];  Service: Cardiovascular;  Laterality: N/A;   CERVICAL DISC SURGERY     IR CT HEAD LTD  03/12/2021   IR PERCUTANEOUS ART THROMBECTOMY/INFUSION INTRACRANIAL INC DIAG ANGIO  03/12/2021   IR US GUIDE VASC ACCESS RIGHT  03/15/2021   LEG SURGERY     right leg and ankle   NECK SURGERY     cyst removed   RADIOLOGY WITH ANESTHESIA N/A 03/12/2021   Procedure: RADIOLOGY WITH ANESTHESIA;  Surgeon: Radiologist, Medication, MD;  Location: [place];  Service: Radiology;  Laterality: N/A;   TEE WITHOUT CARDIOVERSION N/A 02/05/2019   Procedure: TRANSESOPHAGEAL ECHOCARDIOGRAM (TEE);  Surgeon: Josue Hector, MD;  Location: [facility] ENDOSCOPY;  Service: Cardiovascular;  Laterality: N/A;   TOTAL HIP ARTHROPLASTY     left    Family History  Problem Relation Age of Onset   Hepatitis Mother    Clotting disorder Mother    Stroke Mother    Thyroid disease Mother    Colon cancer Father        ?   Rheumatic fever Brother    Heart disease Brother     Social History   Socioeconomic History   Marital status: Married    Spouse name: Not on  file   Number of children: 2   Years of education: Not on file   Highest education level: Not on file  Occupational History   Occupation: disabled    Employer: SELF-EMPLOYED  Tobacco Use   Smoking status: Every Day   Smokeless tobacco: Never  Vaping Use   Vaping Use: Never used  Substance and Sexual Activity   Alcohol use: Yes    Comment: 3-4 a day   Drug use: No   Sexual activity: Not on file  Other Topics Concern   Not on file  Social History Narrative   Not on file   Social Determinants of Health   Financial Resource Strain: Not on file  Food Insecurity: Not on file  Transportation Needs: Not on file  Physical Activity: Not on file  Stress: Not on file  Social Connections: Not on file  Intimate Partner Violence: Not on  file    Review of Systems  Psychiatric/Behavioral:  Negative for depression and suicidal ideas. The patient is nervous/anxious and has insomnia.   All other systems reviewed and are negative.       Objective    BP 120/73   Pulse 96   Temp 98.1 F (36.7 C) (Oral)   Resp 16   Ht 6' (1.829 m)   Wt 190 lb 9.6 oz (86.5 kg)   SpO2 96%   BMI 25.85 kg/m   Physical Exam Vitals and nursing note reviewed.  Constitutional:      General: He is not in acute distress. Cardiovascular:     Rate and Rhythm: Normal rate and regular rhythm.  Pulmonary:     Effort: Pulmonary effort is normal.     Breath sounds: Normal breath sounds.  Abdominal:     Palpations: Abdomen is soft.     Tenderness: There is no abdominal tenderness.  Musculoskeletal:     Right lower leg: No edema.     Left lower leg: No edema.  Skin:      Neurological:     General: No focal deficit present.     Mental Status: He is alert and oriented to person, place, and time.  Psychiatric:        Mood and Affect: Mood normal.         Assessment & Plan:   1. Essential hypertension Appears stable. Continue   2. Anxiety Though paxil has been doing well, will d/c paxil and change to remeron 15 mg and monitor  3. Insomnia due to other mental disorder As above  4. Pure hypercholesterolemia Continue   5. Smoking Discussed reduction/cessation  6. Lesion of neck Referral to gen surg for further eval/mgt - Ambulatory referral to General Surgery   Return in about 4 weeks (around 08/16/2022) for physical.   Becky Sax, MD

## 2022-07-28 NOTE — Telephone Encounter (Unsigned)
Copied from [place] [id number]. Topic: Referral - Status >> Jul 27, 2022  5:21 PM Cyndi Bender wrote: Reason for CRM: Pt reports that he was told that a referral would be placed for the cyst on his neck but he has not heard from anyone regarding scheduling an appt.

## 2022-08-02 ENCOUNTER — Other Ambulatory Visit: Payer: Self-pay | Admitting: Family Medicine

## 2022-08-02 ENCOUNTER — Ambulatory Visit: Payer: Medicare HMO | Admitting: Family Medicine

## 2022-08-02 NOTE — Addendum Note (Signed)
Addended by: Becky Sax on: 08/02/2022 08:35 AM   Modules accepted: Orders

## 2022-08-07 ENCOUNTER — Other Ambulatory Visit: Payer: Self-pay | Admitting: Family Medicine

## 2022-08-07 NOTE — Telephone Encounter (Signed)
Medication Refill - Medication: PARoxetine (PAXIL) 30 MG tablet    Has the patient contacted their pharmacy? No. (Agent: If no, request that the patient contact the pharmacy for the refill. If patient does not wish to contact the pharmacy document the reason why and proceed with request.) (Agent: If yes, when and what did the pharmacy advise?)  Preferred Pharmacy (with phone number or street name):  [facility] [address] Phone: [phone]  Fax: [phone]     Has the patient been seen for an appointment in the last year OR does the patient have an upcoming appointment? Yes.    Agent: Please be advised that RX refills may take up to 3 business days. We ask that you follow-up with your pharmacy.   *patient was recently changed to mirtazapine, however he states that it is not working and would like to go back to Paxil.

## 2022-08-08 NOTE — Telephone Encounter (Signed)
Requested medication (s) are due for refill today: no  Requested medication (s) are on the active medication list: yes  Last refill:  03/07/22 #90  Future visit scheduled: yes  Notes to clinic:  per OV note dated 07/19/22 "will dc Paxil and change to Remeron 15 mg"   Requested Prescriptions  Pending Prescriptions Disp Refills   PARoxetine (PAXIL) 30 MG tablet 90 tablet 0     Psychiatry:  Antidepressants - SSRI Passed - 08/07/2022  3:05 PM      Passed - Valid encounter within last 6 months    Recent Outpatient Visits           2 weeks ago Essential hypertension   [place] [facility] at [facility], MD   1 year ago Essential hypertension   [place] [facility] at [facility], MD       Future Appointments             In 2 weeks Dorna Mai, MD [place] [facility]

## 2022-08-09 ENCOUNTER — Ambulatory Visit (INDEPENDENT_AMBULATORY_CARE_PROVIDER_SITE_OTHER): Payer: Medicare HMO | Admitting: Surgery

## 2022-08-09 ENCOUNTER — Ambulatory Visit: Payer: Self-pay

## 2022-08-09 ENCOUNTER — Encounter: Payer: Self-pay | Admitting: Surgery

## 2022-08-09 VITALS — BP 156/86 | HR 125 | Temp 98.3°F | Ht 72.0 in | Wt 191.4 lb

## 2022-08-09 DIAGNOSIS — Z5321 Procedure and treatment not carried out due to patient leaving prior to being seen by health care provider: Secondary | ICD-10-CM

## 2022-08-09 NOTE — Progress Notes (Signed)
08/09/22  Patient left before being seen.  Olean Ree, MD

## 2022-08-09 NOTE — Telephone Encounter (Signed)
When asked to confirm his name and DOB prt replied"Here we go again." "Ill as hell- going to choke someone"  Pt requesting to be prescribed Paxil-" ASAP" d"ose was 30 mg generic"  Tired being of ill."  Wants Paxil called in to [facility]. Pt was angry, and upset at the beginning of he call. Pt tried to be confrontational and just allowed him to talk.  Cussed occasionally. Pt stated later that he was sorry but that he feels "ill and angry and Im tired of feeling this way."  Later in call was calmer.  Chief Complaint: wants Paxil re prescribed Symptoms: angry confrontational "ill as hell" Frequency: did not ask  Pertinent Negatives: Patient denies n/a Disposition: [] ED /[] Urgent Care (no appt availability in office) / [] Appointment(In office/virtual)/ []  [facility]/ [] Home Care/ [] Refused Recommended Disposition /[] [facility]/ [x]  Follow-up with PCP Additional Notes: Pt is having surgery this afternoon- was not interested in making appt.  Reason for Disposition  Prescription request for new medicine (not a refill)  Answer Assessment - Initial Assessment Questions 1. NAME of MEDICINE: "What medicine(s) are you calling about?"     Paxil 2. QUESTION: "What is your question?" (e.g., double dose of medicine, side effect)     Pt wanting to be put back on Paxil stated current therapy is not addressing his anxiety and "being ill to people" 3. PRESCRIBER: "Who prescribed the medicine?" Reason: if prescribed by specialist, call should be referred to that group.     PCP 4. SYMPTOMS: "Do you have any symptoms?" If Yes, ask: "What symptoms are you having?"  "How bad are the symptoms (e.g., mild, moderate, severe)     Anxiety, irritability, confrontational 5. PREGNANCY:  "Is there any chance that you are pregnant?" "When was your last menstrual period?"     N/a  Protocols used: Medication Question Call-A-AH

## 2022-08-10 ENCOUNTER — Other Ambulatory Visit: Payer: Self-pay | Admitting: Family Medicine

## 2022-08-10 ENCOUNTER — Telehealth: Payer: Self-pay | Admitting: Family Medicine

## 2022-08-10 MED ORDER — PAROXETINE HCL 30 MG PO TABS: ORAL_TABLET | ORAL | 1 refills | Status: DC

## 2022-08-10 NOTE — Telephone Encounter (Signed)
PARoxetine (PAXIL) 30 MG tablet [[id number]]   Ordering User: Dorna Mai, [place]), [place] [facility] [place] [address] [place])  [id number] Phone: [phone]  Fax: [phone]

## 2022-08-10 NOTE — Telephone Encounter (Signed)
PARoxetine (PAXIL) 30 MG tablet [[id number]]

## 2022-08-22 ENCOUNTER — Encounter: Payer: Self-pay | Admitting: Family Medicine

## 2022-08-22 ENCOUNTER — Ambulatory Visit (INDEPENDENT_AMBULATORY_CARE_PROVIDER_SITE_OTHER): Payer: Medicare HMO | Admitting: Family Medicine

## 2022-08-22 VITALS — BP 108/71 | HR 95 | Temp 97.4°F | Resp 16 | Ht 72.0 in | Wt 191.0 lb

## 2022-08-22 DIAGNOSIS — Z0001 Encounter for general adult medical examination with abnormal findings: Secondary | ICD-10-CM

## 2022-08-22 DIAGNOSIS — F419 Anxiety disorder, unspecified: Secondary | ICD-10-CM | POA: Diagnosis not present

## 2022-08-22 DIAGNOSIS — I10 Essential (primary) hypertension: Secondary | ICD-10-CM

## 2022-08-22 DIAGNOSIS — Z0000 Encounter for general adult medical examination without abnormal findings: Secondary | ICD-10-CM

## 2022-08-22 DIAGNOSIS — F99 Mental disorder, not otherwise specified: Secondary | ICD-10-CM

## 2022-08-22 DIAGNOSIS — M79605 Pain in left leg: Secondary | ICD-10-CM | POA: Diagnosis not present

## 2022-08-22 DIAGNOSIS — F5105 Insomnia due to other mental disorder: Secondary | ICD-10-CM | POA: Diagnosis not present

## 2022-08-22 DIAGNOSIS — R69 Illness, unspecified: Secondary | ICD-10-CM | POA: Diagnosis not present

## 2022-08-22 DIAGNOSIS — F17210 Nicotine dependence, cigarettes, uncomplicated: Secondary | ICD-10-CM

## 2022-08-22 DIAGNOSIS — F17200 Nicotine dependence, unspecified, uncomplicated: Secondary | ICD-10-CM

## 2022-08-22 MED ORDER — TRAZODONE HCL 50 MG PO TABS: 50.0000 mg | ORAL_TABLET | Freq: Every day | ORAL | 0 refills | Status: DC

## 2022-08-22 MED ORDER — TRIAMCINOLONE ACETONIDE 40 MG/ML IJ SUSP
40.0000 mg | Freq: Once | INTRAMUSCULAR | Status: AC
  Administered 2022-08-22: 40 mg via INTRAMUSCULAR

## 2022-08-22 NOTE — Progress Notes (Unsigned)
Subjective:   Patrick Padilla is a 67 y.o. male who presents for a Welcome to Medicare exam.   Review of Systems: Refer to Pcp       Objective:    Today's Vitals   08/22/22 1359  BP: 108/71  Pulse: 95  Resp: 16  Temp: (!) 97.4 F (36.3 C)  TempSrc: Oral  SpO2: 95%  Weight: 191 lb (86.6 kg)  Height: 6' (1.829 m)   Body mass index is 25.9 kg/m.  Medications Outpatient Encounter Medications as of 08/22/2022  Medication Sig   apixaban (ELIQUIS) 5 MG TABS tablet Take 1 tablet (5 mg total) by mouth 2 (two) times daily.   baclofen (LIORESAL) 10 MG tablet Take 1 tablet (10 mg total) by mouth 2 (two) times daily.   folic acid (FOLVITE) 1 MG tablet Take 1 tablet (1 mg total) by mouth daily.   mirtazapine (REMERON) 15 MG tablet Take 1 tablet (15 mg total) by mouth at bedtime.   Multiple Vitamin (MULTIVITAMIN WITH MINERALS) TABS tablet Take 1 tablet by mouth daily.   PARoxetine (PAXIL) 30 MG tablet TAKE 1 TABLET BY MOUTH ONCE DAILY   simvastatin (ZOCOR) 20 MG tablet Take 1 tablet by mouth daily.   telmisartan (MICARDIS) 40 MG tablet Take 1 tablet (40 mg total) by mouth daily.   No facility-administered encounter medications on file as of 08/22/2022.     History: Past Medical History:  Diagnosis Date   Anxiety    Elevated LFTs    History of colon polyps    HLD (hyperlipidemia)    HTN (hypertension)    Localized superficial swelling, mass, or lump    Stroke [facility])    Past Surgical History:  Procedure Laterality Date   CARDIOVERSION N/A 02/05/2019   Procedure: CARDIOVERSION;  Surgeon: Josue Hector, MD;  Location: [place];  Service: Cardiovascular;  Laterality: N/A;   CERVICAL DISC SURGERY     IR CT HEAD LTD  03/12/2021   IR PERCUTANEOUS ART THROMBECTOMY/INFUSION INTRACRANIAL INC DIAG ANGIO  03/12/2021   IR US GUIDE VASC ACCESS RIGHT  03/15/2021   LEG SURGERY     right leg and ankle   NECK SURGERY     cyst removed   RADIOLOGY WITH ANESTHESIA N/A 03/12/2021   Procedure:  RADIOLOGY WITH ANESTHESIA;  Surgeon: Radiologist, Medication, MD;  Location: [place];  Service: Radiology;  Laterality: N/A;   TEE WITHOUT CARDIOVERSION N/A 02/05/2019   Procedure: TRANSESOPHAGEAL ECHOCARDIOGRAM (TEE);  Surgeon: Josue Hector, MD;  Location: [facility] ENDOSCOPY;  Service: Cardiovascular;  Laterality: N/A;   TOTAL HIP ARTHROPLASTY     left    Family History  Problem Relation Age of Onset   Hepatitis Mother    Clotting disorder Mother    Stroke Mother    Thyroid disease Mother    Colon cancer Father        ?   Rheumatic fever Brother    Heart disease Brother    Social History   Occupational History   Occupation: disabled    Fish farm manager: SELF-EMPLOYED  Tobacco Use   Smoking status: Every Day   Smokeless tobacco: Never  Vaping Use   Vaping Use: Never used  Substance and Sexual Activity   Alcohol use: Yes    Comment: 3-4 a day   Drug use: No   Sexual activity: Not on file   Tobacco Counseling Ready to quit: Not Answered Counseling given: Not Answered   Immunizations and Health Maintenance  There is no immunization  history on file for this patient. There are no preventive care reminders to display for this patient.   Activities of Daily Living     No data to display          Physical Exam  ***(optional), or other factors deemed appropriate based on the beneficiary's medical and social history and current clinical standards.  Advanced Directives:      Assessment:    This is a routine wellness  examination for this patient . ***  Vision/Hearing screen No results found.  Dietary issues and exercise activities discussed:      Goals   None     Depression Screen    08/22/2022    1:56 PM 07/19/2022    8:48 AM 09/21/2021   12:00 PM 06/22/2021    9:53 AM  PHQ 2/9 Scores  PHQ - 2 Score 0 4 0 0  PHQ- 9 Score 0 15       Fall Risk    09/21/2021   11:57 AM  [place] in the past year? 0  Number falls in past yr: 0  Injury with Fall? 0     Cognitive Function        Patient Care Team: Dorna Mai, MD as PCP - General (Family Medicine) Croitoru, Dani Gobble, MD as PCP - Cardiology (Cardiology)     Plan:    I have personally reviewed and noted the following in the patient's chart:   Medical and social history Use of alcohol, tobacco or illicit drugs  Current medications and supplements Functional ability and status Nutritional status Physical activity Advanced directives List of other physicians Hospitalizations, surgeries, and ER visits in previous 12 months Vitals Screenings to include cognitive, depression, and falls Referrals and appointments  In addition, I have reviewed and discussed with patient certain preventive protocols, quality metrics, and best practice recommendations. A written personalized care plan for preventive services as well as general preventive health recommendations were provided to patient.    Melene Plan, RMA 08/22/2022

## 2022-08-24 ENCOUNTER — Encounter: Payer: Self-pay | Admitting: Family Medicine

## 2022-08-24 NOTE — Progress Notes (Signed)
Established Patient Office Visit  Subjective    Patient ID: Patrick Padilla, male    DOB: 07-05-1956  Age: 67 y.o. MRN: [id number]  CC:  Chief Complaint  Patient presents with   Annual Exam   Medicare Wellness    HPI Patrick Padilla presents for follow up of chronic med issues. He reports that he continues to have lower extremity pain. He left the surgeon's office before being seen and has not had the lesion on his neck removed.    Outpatient Encounter Medications as of 08/22/2022  Medication Sig   apixaban (ELIQUIS) 5 MG TABS tablet Take 1 tablet (5 mg total) by mouth 2 (two) times daily.   baclofen (LIORESAL) 10 MG tablet Take 1 tablet (10 mg total) by mouth 2 (two) times daily.   folic acid (FOLVITE) 1 MG tablet Take 1 tablet (1 mg total) by mouth daily.   Multiple Vitamin (MULTIVITAMIN WITH MINERALS) TABS tablet Take 1 tablet by mouth daily.   PARoxetine (PAXIL) 30 MG tablet TAKE 1 TABLET BY MOUTH ONCE DAILY   simvastatin (ZOCOR) 20 MG tablet Take 1 tablet by mouth daily.   telmisartan (MICARDIS) 40 MG tablet Take 1 tablet (40 mg total) by mouth daily.   traZODone (DESYREL) 50 MG tablet Take 1 tablet (50 mg total) by mouth at bedtime.   [DISCONTINUED] mirtazapine (REMERON) 15 MG tablet Take 1 tablet (15 mg total) by mouth at bedtime.   [EXPIRED] triamcinolone acetonide (KENALOG-40) injection 40 mg    No facility-administered encounter medications on file as of 08/22/2022.    Past Medical History:  Diagnosis Date   Anxiety    Elevated LFTs    History of colon polyps    HLD (hyperlipidemia)    HTN (hypertension)    Localized superficial swelling, mass, or lump    Stroke [facility])     Past Surgical History:  Procedure Laterality Date   CARDIOVERSION N/A 02/05/2019   Procedure: CARDIOVERSION;  Surgeon: Josue Hector, MD;  Location: [place];  Service: Cardiovascular;  Laterality: N/A;   CERVICAL DISC SURGERY     IR CT HEAD LTD  03/12/2021   IR PERCUTANEOUS ART  THROMBECTOMY/INFUSION INTRACRANIAL INC DIAG ANGIO  03/12/2021   IR US GUIDE VASC ACCESS RIGHT  03/15/2021   LEG SURGERY     right leg and ankle   NECK SURGERY     cyst removed   RADIOLOGY WITH ANESTHESIA N/A 03/12/2021   Procedure: RADIOLOGY WITH ANESTHESIA;  Surgeon: Radiologist, Medication, MD;  Location: [place];  Service: Radiology;  Laterality: N/A;   TEE WITHOUT CARDIOVERSION N/A 02/05/2019   Procedure: TRANSESOPHAGEAL ECHOCARDIOGRAM (TEE);  Surgeon: Josue Hector, MD;  Location: [facility] ENDOSCOPY;  Service: Cardiovascular;  Laterality: N/A;   TOTAL HIP ARTHROPLASTY     left    Family History  Problem Relation Age of Onset   Hepatitis Mother    Clotting disorder Mother    Stroke Mother    Thyroid disease Mother    Colon cancer Father        ?   Rheumatic fever Brother    Heart disease Brother     Social History   Socioeconomic History   Marital status: Married    Spouse name: Not on file   Number of children: 2   Years of education: Not on file   Highest education level: Not on file  Occupational History   Occupation: disabled    Employer: SELF-EMPLOYED  Tobacco Use   Smoking status: Every  Day   Smokeless tobacco: Never  Vaping Use   Vaping Use: Never used  Substance and Sexual Activity   Alcohol use: Yes    Comment: 3-4 a day   Drug use: No   Sexual activity: Not on file  Other Topics Concern   Not on file  Social History Narrative   Not on file   Social Determinants of Health   Financial Resource Strain: Not on file  Food Insecurity: Not on file  Transportation Needs: Not on file  Physical Activity: Not on file  Stress: Not on file  Social Connections: Not on file  Intimate Partner Violence: Not on file    Review of Systems  All other systems reviewed and are negative.       Objective    BP 108/71   Pulse 95   Temp (!) 97.4 F (36.3 C) (Oral)   Resp 16   Ht 6' (1.829 m)   Wt 191 lb (86.6 kg)   SpO2 95%   BMI 25.90 kg/m   Physical  Exam Vitals and nursing note reviewed.  Constitutional:      General: He is not in acute distress. Cardiovascular:     Rate and Rhythm: Normal rate and regular rhythm.  Pulmonary:     Effort: Pulmonary effort is normal.     Breath sounds: Normal breath sounds.  Abdominal:     Palpations: Abdomen is soft.     Tenderness: There is no abdominal tenderness.  Musculoskeletal:     Right lower leg: No edema.     Left lower leg: No edema.  Neurological:     General: No focal deficit present.     Mental Status: He is alert and oriented to person, place, and time.  Psychiatric:        Mood and Affect: Mood normal.         Assessment & Plan:   1. Encounter for Medicare annual wellness exam   2. Essential hypertension Appears stable. continue  3. Anxiety continue  4. Insomnia due to other mental disorder Trazodone refilled.   5. Lower extremity pain, left Kenalog IM injection given - triamcinolone acetonide (KENALOG-40) injection 40 mg  6. Smoking Discussed cessation/reduction  Return in about 6 months (around 02/20/2023) for follow up.   Becky Sax, MD

## 2022-09-05 ENCOUNTER — Other Ambulatory Visit: Payer: Self-pay | Admitting: Family Medicine

## 2022-09-05 IMAGING — DX DG WRIST COMPLETE 3+V*L*
4 series · 4 of 4 positions shown · non-contrast
Comparison: None

CLINICAL DATA: Pain on the radial side of LEFT wrist at second and
third MCP joints by report. 65-year-old male.

EXAM:
LEFT WRIST - COMPLETE 3+ VIEW

[wrist pa (1 of 2)]
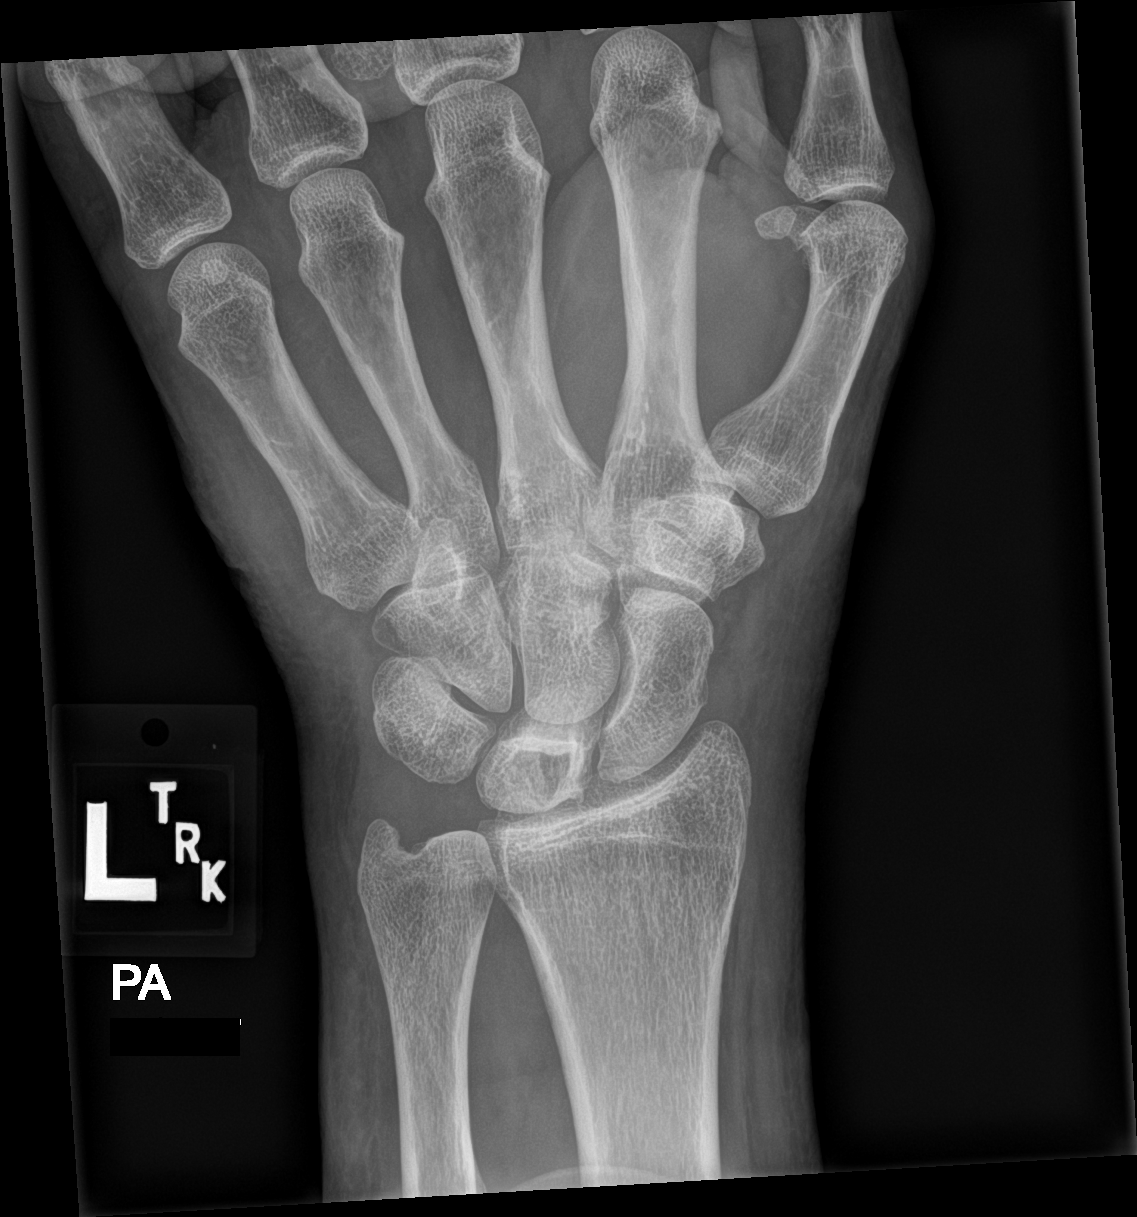

[wrist obl]
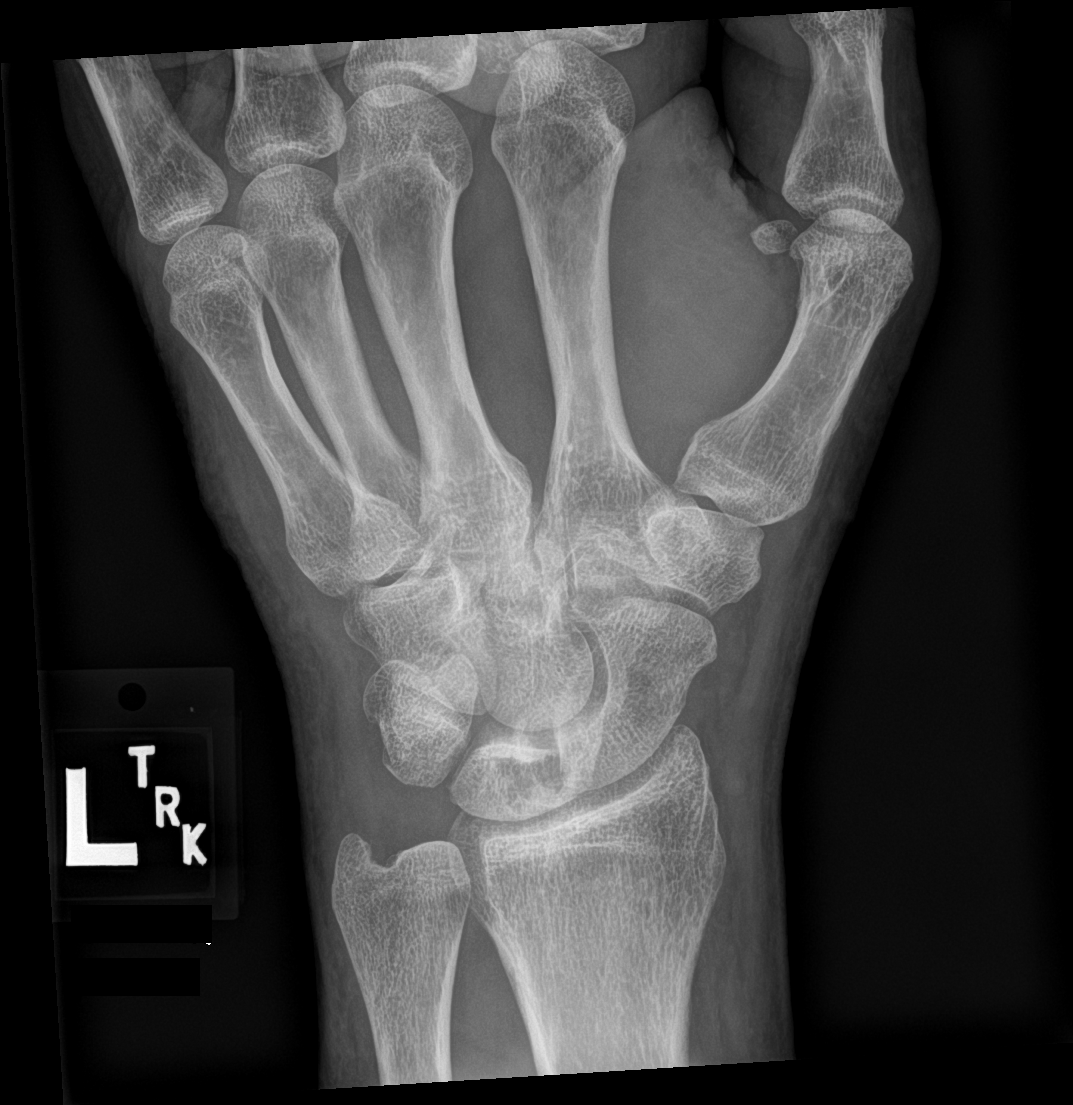

[wrist lat]
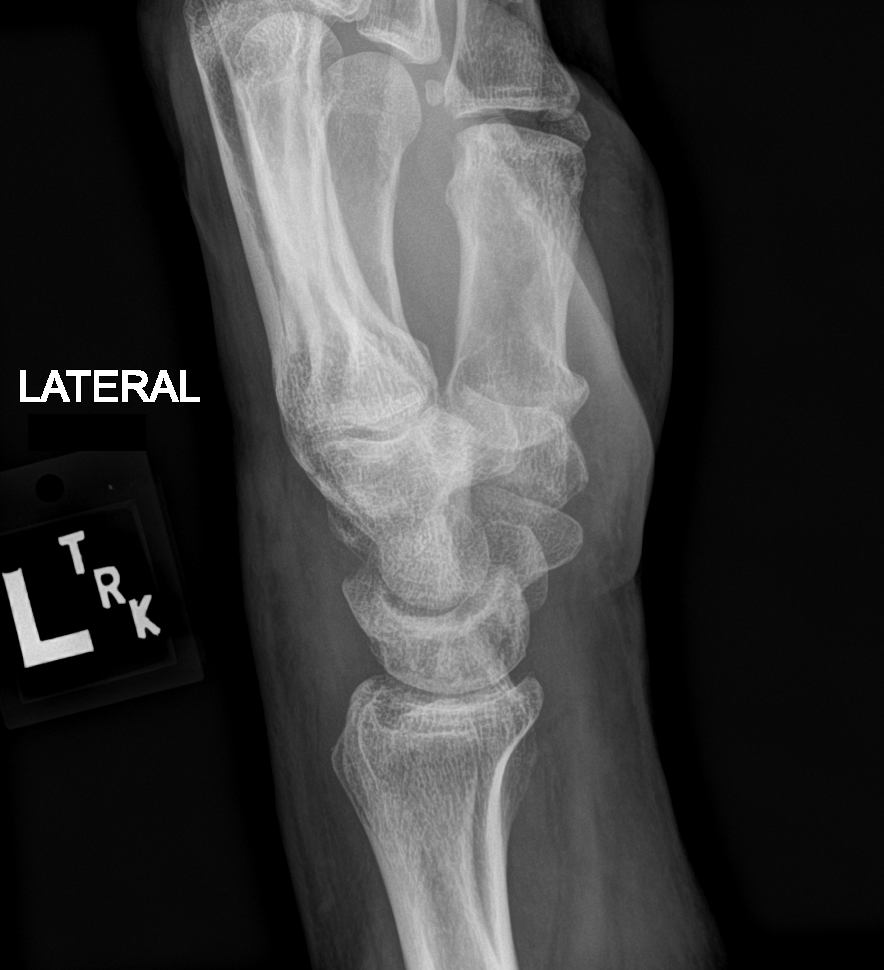

[wrist pa (2 of 2)]
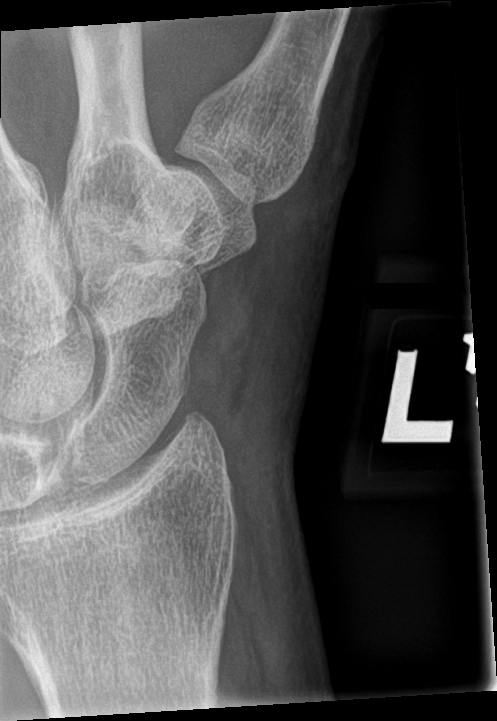

[4 of 4 positions shown; findings below may reference images not displayed]

FINDINGS: Mild degenerative changes about the wrist in the first CMC and
radiocarpal joint. No significant soft tissue swelling. No sign of
fracture or dislocation.
IMPRESSION: Mild degenerative changes without acute finding.

## 2023-02-28 ENCOUNTER — Other Ambulatory Visit: Payer: Self-pay | Admitting: Family Medicine

## 2023-03-01 NOTE — Telephone Encounter (Signed)
Requested Prescriptions  Pending Prescriptions Disp Refills   PARoxetine (PAXIL) 30 MG tablet [Pharmacy Med Name: PARoxetine HCl 30 MG Oral Tablet] 90 tablet 0    Sig: Take 1 tablet by mouth once daily     Psychiatry:  Antidepressants - SSRI Failed - 02/28/2023 11:12 AM      Failed - Valid encounter within last 6 months    Recent Outpatient Visits           6 months ago Encounter for Medicare annual wellness exam   [place] [facility], MD   7 months ago Essential hypertension   [place] [facility], MD   1 year ago Essential hypertension   [place] [facility], MD       Future Appointments             In 2 weeks Georganna Skeans, MD [facility]

## 2023-03-21 ENCOUNTER — Ambulatory Visit: Payer: Medicare PPO | Admitting: Family Medicine

## 2023-03-21 VITALS — BP 127/83 | HR 100 | Temp 98.1°F | Resp 16 | Wt 190.6 lb

## 2023-03-21 DIAGNOSIS — L989 Disorder of the skin and subcutaneous tissue, unspecified: Secondary | ICD-10-CM

## 2023-03-21 DIAGNOSIS — Z720 Tobacco use: Secondary | ICD-10-CM

## 2023-03-21 DIAGNOSIS — F419 Anxiety disorder, unspecified: Secondary | ICD-10-CM | POA: Diagnosis not present

## 2023-03-21 DIAGNOSIS — H9190 Unspecified hearing loss, unspecified ear: Secondary | ICD-10-CM | POA: Diagnosis not present

## 2023-03-21 DIAGNOSIS — I10 Essential (primary) hypertension: Secondary | ICD-10-CM

## 2023-03-21 DIAGNOSIS — E785 Hyperlipidemia, unspecified: Secondary | ICD-10-CM

## 2023-03-21 MED ORDER — PAROXETINE HCL 30 MG PO TABS: ORAL_TABLET | ORAL | 1 refills | Status: DC

## 2023-03-21 MED ORDER — TRAZODONE HCL 100 MG PO TABS: 100.0000 mg | ORAL_TABLET | Freq: Every day | ORAL | 1 refills | Status: DC

## 2023-03-22 ENCOUNTER — Encounter: Payer: Self-pay | Admitting: Family Medicine

## 2023-03-22 NOTE — Progress Notes (Signed)
Established Patient Office Visit  Subjective    Patient ID: Patrick Padilla, male    DOB: August 22, 1955  Age: 67 y.o. MRN: [id number]  CC:  Chief Complaint  Patient presents with   Medical Management of Chronic Issues    HPI Patrick Padilla presents for review of chronic med issues. Patient denies acute complaints or concerns.   Outpatient Encounter Medications as of 03/21/2023  Medication Sig   apixaban (ELIQUIS) 5 MG TABS tablet Take 1 tablet (5 mg total) by mouth 2 (two) times daily.   baclofen (LIORESAL) 10 MG tablet Take 1 tablet (10 mg total) by mouth 2 (two) times daily.   folic acid (FOLVITE) 1 MG tablet Take 1 tablet (1 mg total) by mouth daily.   Multiple Vitamin (MULTIVITAMIN WITH MINERALS) TABS tablet Take 1 tablet by mouth daily.   simvastatin (ZOCOR) 20 MG tablet Take 1 tablet by mouth daily.   telmisartan (MICARDIS) 40 MG tablet Take 1 tablet (40 mg total) by mouth daily.   traZODone (DESYREL) 100 MG tablet Take 1 tablet (100 mg total) by mouth at bedtime.   traZODone (DESYREL) 50 MG tablet Take 1 tablet (50 mg total) by mouth at bedtime.   [DISCONTINUED] PARoxetine (PAXIL) 30 MG tablet Take 1 tablet by mouth once daily   PARoxetine (PAXIL) 30 MG tablet TAKE 1 TABLET BY MOUTH ONCE DAILY   Pediatric Multivitamins-Fl (MULTIVITAMIN + FLUORIDE) 0.25 MG CHEW Chew 1 tablet by mouth daily.   No facility-administered encounter medications on file as of 03/21/2023.    Past Medical History:  Diagnosis Date   Anxiety    Elevated LFTs    History of colon polyps    HLD (hyperlipidemia)    HTN (hypertension)    Localized superficial swelling, mass, or lump    Stroke [facility])     Past Surgical History:  Procedure Laterality Date   CARDIOVERSION N/A 02/05/2019   Procedure: CARDIOVERSION;  Surgeon: Wendall Stade, MD;  Location: [facility] ENDOSCOPY;  Service: Cardiovascular;  Laterality: N/A;   CERVICAL DISC SURGERY     IR CT HEAD LTD  03/12/2021   IR PERCUTANEOUS ART THROMBECTOMY/INFUSION  INTRACRANIAL INC DIAG ANGIO  03/12/2021   IR US GUIDE VASC ACCESS RIGHT  03/15/2021   LEG SURGERY     right leg and ankle   NECK SURGERY     cyst removed   RADIOLOGY WITH ANESTHESIA N/A 03/12/2021   Procedure: RADIOLOGY WITH ANESTHESIA;  Surgeon: Radiologist, Medication, MD;  [facility];  Service: Radiology;  Laterality: N/A;   TEE WITHOUT CARDIOVERSION N/A 02/05/2019   Procedure: TRANSESOPHAGEAL ECHOCARDIOGRAM (TEE);  Surgeon: Wendall Stade, MD;  Location: [facility] ENDOSCOPY;  Service: Cardiovascular;  Laterality: N/A;   TOTAL HIP ARTHROPLASTY     left    Family History  Problem Relation Age of Onset   Hepatitis Mother    Clotting disorder Mother    Stroke Mother    Thyroid disease Mother    Colon cancer Father        ?   Rheumatic fever Brother    Heart disease Brother     Social History   Socioeconomic History   Marital status: Married    Spouse name: Not on file   Number of children: 2   Years of education: Not on file   Highest education level: Not on file  Occupational History   Occupation: disabled    Employer: SELF-EMPLOYED  Tobacco Use   Smoking status: Every Day   Smokeless tobacco:  Never  Vaping Use   Vaping status: Never Used  Substance and Sexual Activity   Alcohol use: Yes    Comment: 3-4 a day   Drug use: No   Sexual activity: Not on file  Other Topics Concern   Not on file  Social History Narrative   Not on file   Social Determinants of Health   Financial Resource Strain: Low Risk  (03/21/2023)   Overall Financial Resource Strain (CARDIA)    Difficulty of Paying Living Expenses: Not very hard  Food Insecurity: Food Insecurity Present (03/21/2023)   Hunger Vital Sign    Worried About Running Out of Food in the Last Year: Sometimes true    Ran Out of Food in the Last Year: Sometimes true  Transportation Needs: No Transportation Needs (03/21/2023)   PRAPARE - Administrator, Civil Service (Medical): No    Lack of Transportation  (Non-Medical): No  Physical Activity: Inactive (03/21/2023)   Exercise Vital Sign    Days of Exercise per Week: 0 days    Minutes of Exercise per Session: 0 min  Stress: No Stress Concern Present (03/21/2023)   [employer] of Occupational Health - Occupational Stress Questionnaire    Feeling of Stress : Not at all  Social Connections: Unknown (03/21/2023)   Social Connection and Isolation Panel [NHANES]    Frequency of Communication with Friends and Family: Three times a week    Frequency of Social Gatherings with Friends and Family: Three times a week    Attends Religious Services: More than 4 times per year    Active Member of Clubs or Organizations: No    Attends Banker Meetings: Never    Marital Status: Not on file  Intimate Partner Violence: Not At Risk (03/21/2023)   Humiliation, Afraid, Rape, and Kick questionnaire    Fear of Current or Ex-Partner: No    Emotionally Abused: No    Physically Abused: No    Sexually Abused: No    Review of Systems  All other systems reviewed and are negative.       Objective    BP 127/83   Pulse 100   Temp 98.1 F (36.7 C) (Oral)   Resp 16   Wt 190 lb 9.6 oz (86.5 kg)   SpO2 94%   BMI 25.85 kg/m   Physical Exam Vitals and nursing note reviewed.  Constitutional:      General: He is not in acute distress. HENT:     Right Ear: Decreased hearing noted.     Left Ear: Decreased hearing noted.  Cardiovascular:     Rate and Rhythm: Normal rate and regular rhythm.  Pulmonary:     Effort: Pulmonary effort is normal.     Breath sounds: Normal breath sounds.  Abdominal:     Palpations: Abdomen is soft.     Tenderness: There is no abdominal tenderness.  Musculoskeletal:     Right lower leg: No edema.     Left lower leg: No edema.  Neurological:     General: No focal deficit present.     Mental Status: He is alert and oriented to person, place, and time.  Psychiatric:        Mood and Affect: Mood normal.          Assessment & Plan:   1. Essential hypertension Appears stable.continue   2. Hyperlipidemia, unspecified hyperlipidemia type Continue   3. Anxiety Meds refilled. Continue   4. Lesion of neck Referral for  further eval/mgt - Ambulatory referral to Dermatology  5. Tobacco abuse Discussed reduction/cessation  6. Hearing loss, unspecified hearing loss type, unspecified laterality Patient defers further eval/mgt    Return in about 6 months (around 09/18/2023) for follow up.   Tommie Raymond, MD

## 2023-05-23 ENCOUNTER — Other Ambulatory Visit: Payer: Self-pay | Admitting: Family Medicine

## 2023-06-04 NOTE — Telephone Encounter (Signed)
Medication refill

## 2023-06-05 NOTE — Telephone Encounter (Signed)
apixaban (ELIQUIS) 5 MG TABS tablet

## 2023-06-13 ENCOUNTER — Other Ambulatory Visit: Payer: Self-pay | Admitting: Family Medicine

## 2023-06-17 ENCOUNTER — Other Ambulatory Visit: Payer: Self-pay | Admitting: Family Medicine

## 2023-07-18 ENCOUNTER — Other Ambulatory Visit: Payer: Self-pay | Admitting: Family Medicine

## 2023-07-25 ENCOUNTER — Telehealth: Payer: Self-pay | Admitting: Family Medicine

## 2023-07-25 DIAGNOSIS — I482 Chronic atrial fibrillation, unspecified: Secondary | ICD-10-CM | POA: Diagnosis not present

## 2023-07-25 DIAGNOSIS — Z800 Family history of malignant neoplasm of digestive organs: Secondary | ICD-10-CM | POA: Diagnosis not present

## 2023-07-25 DIAGNOSIS — I6789 Other cerebrovascular disease: Secondary | ICD-10-CM | POA: Diagnosis not present

## 2023-07-25 DIAGNOSIS — Z1211 Encounter for screening for malignant neoplasm of colon: Secondary | ICD-10-CM | POA: Diagnosis not present

## 2023-07-25 NOTE — Telephone Encounter (Signed)
 Patrick Padilla

## 2023-08-04 ENCOUNTER — Other Ambulatory Visit: Payer: Self-pay | Admitting: Family Medicine

## 2023-09-12 ENCOUNTER — Telehealth: Payer: Self-pay | Admitting: Family Medicine

## 2023-09-12 NOTE — Telephone Encounter (Signed)
 Patient needs a MD appointment  gave to front desk to schedule

## 2023-09-12 NOTE — Telephone Encounter (Signed)
 A document form has been faxed: Surgical Clearance, to be filled out by provider. Send document back via Fax within 5-days. Document is located in providers tray at front office.           Fax number:  [phone]    Called pt and left vm to call office back to schedule appt. Surgery scheduled for 04/01

## 2023-09-17 ENCOUNTER — Telehealth: Payer: Self-pay | Admitting: Family Medicine

## 2023-09-17 NOTE — Telephone Encounter (Signed)
 Called pt again to schedule appt for surgery clearance with provider; left a vm to call office back. Surgery is scheduled for 04/01. Pt may be db with provider

## 2023-09-27 ENCOUNTER — Telehealth: Payer: Self-pay | Admitting: Family Medicine

## 2023-09-27 NOTE — Telephone Encounter (Signed)
 A document form has been faxed: Surgical Clearance, to be filled out by provider. Send document back via Fax within 5-days. Document is located in providers tray at front office.           Fax number:  [phone]    Called pt and left vm to call office back to schedule appt for clearance form; colonoscopy scheduled for 04/01. May be DB

## 2023-09-27 NOTE — Telephone Encounter (Signed)
 I called patient no one answered so I left a voicemail for him to return call.  Patient needs to schedule an appointment to come in and have paperwork done.

## 2023-10-03 NOTE — Telephone Encounter (Addendum)
 Pt scheduled for clearance 03/31 at 1:20pm

## 2023-10-03 NOTE — Telephone Encounter (Unsigned)
 Copied from CRM [id number]. Topic: Clinical - Medication Question >> Oct 03, 2023  3:59 PM Gildardo Pounds wrote: Reason for CRM: Patient received a call about his colonoscopy and they said something about his ELIQUIS 5 MG TABS tablet. [facility] ask for Morrie Sheldon [phone] for clarity on what the patient needs. Callback number is [phone]

## 2023-10-03 NOTE — Telephone Encounter (Signed)
 Patient need appointment for medical clearance

## 2023-10-08 ENCOUNTER — Ambulatory Visit (INDEPENDENT_AMBULATORY_CARE_PROVIDER_SITE_OTHER): Admitting: Family Medicine

## 2023-10-08 ENCOUNTER — Telehealth: Payer: Self-pay | Admitting: Physical Medicine and Rehabilitation

## 2023-10-08 ENCOUNTER — Encounter: Payer: Self-pay | Admitting: Family Medicine

## 2023-10-08 VITALS — BP 139/86 | HR 99 | Temp 97.8°F | Resp 18 | Ht 72.0 in | Wt 198.4 lb

## 2023-10-08 DIAGNOSIS — Z0289 Encounter for other administrative examinations: Secondary | ICD-10-CM

## 2023-10-08 DIAGNOSIS — M25562 Pain in left knee: Secondary | ICD-10-CM

## 2023-10-10 ENCOUNTER — Encounter: Payer: Self-pay | Admitting: Physician Assistant

## 2023-10-10 ENCOUNTER — Ambulatory Visit: Admitting: Physician Assistant

## 2023-10-10 ENCOUNTER — Encounter: Payer: Self-pay | Admitting: Family Medicine

## 2023-10-10 ENCOUNTER — Other Ambulatory Visit (INDEPENDENT_AMBULATORY_CARE_PROVIDER_SITE_OTHER): Payer: Self-pay

## 2023-10-10 DIAGNOSIS — G8929 Other chronic pain: Secondary | ICD-10-CM

## 2023-10-10 DIAGNOSIS — M25562 Pain in left knee: Secondary | ICD-10-CM | POA: Insufficient documentation

## 2023-10-10 DIAGNOSIS — M25561 Pain in right knee: Secondary | ICD-10-CM | POA: Diagnosis not present

## 2023-10-10 MED ORDER — METHYLPREDNISOLONE ACETATE 40 MG/ML IJ SUSP
40.0000 mg | INTRAMUSCULAR | Status: AC | PRN
  Administered 2023-10-10: 40 mg via INTRA_ARTICULAR

## 2023-10-10 MED ORDER — LIDOCAINE HCL 1 % IJ SOLN
3.0000 mL | INTRAMUSCULAR | Status: AC | PRN
  Administered 2023-10-10: 3 mL

## 2023-10-10 MED ORDER — METHYLPREDNISOLONE ACETATE 40 MG/ML IJ SUSP
40.0000 mg | Freq: Once | INTRAMUSCULAR | Status: AC
  Administered 2023-10-10: 40 mg via INTRAMUSCULAR

## 2023-10-10 NOTE — Progress Notes (Signed)
 Established Patient Office Visit  Subjective    Patient ID: Patrick Padilla, male    DOB: Mar 08, 1956  Age: 68 y.o. MRN: [id number]  CC:  Chief Complaint  Patient presents with   clearance for colonoscopy    Paperwork filled out, left knee pain    HPI Patrick Padilla presents for completion of paperwork for upcoming colonoscopy clearance. Patient also reports that he was horse playing with his adult child and got kicked in the left knee. This happened about 3 weeks ago and he continues to have pain and difficulty walking.   Outpatient Encounter Medications as of 10/08/2023  Medication Sig   baclofen (LIORESAL) 10 MG tablet Take 1 tablet (10 mg total) by mouth 2 (two) times daily.   ELIQUIS 5 MG TABS tablet Take 1 tablet by mouth twice daily   Multiple Vitamin (MULTIVITAMIN WITH MINERALS) TABS tablet Take 1 tablet by mouth daily.   PARoxetine (PAXIL) 30 MG tablet Take 1 tablet by mouth once daily   simvastatin (ZOCOR) 20 MG tablet Take 1 tablet by mouth daily.   telmisartan (MICARDIS) 40 MG tablet Take 1 tablet (40 mg total) by mouth daily.   folic acid (FOLVITE) 1 MG tablet Take 1 tablet (1 mg total) by mouth daily. (Patient not taking: Reported on 10/08/2023)   Pediatric Multivitamins-Fl (MULTIVITAMIN + FLUORIDE) 0.25 MG CHEW Chew 1 tablet by mouth daily. (Patient not taking: Reported on 10/08/2023)   traZODone (DESYREL) 100 MG tablet Take 1 tablet (100 mg total) by mouth at bedtime. (Patient not taking: Reported on 10/08/2023)   traZODone (DESYREL) 50 MG tablet Take 1 tablet (50 mg total) by mouth at bedtime. (Patient not taking: Reported on 10/08/2023)   No facility-administered encounter medications on file as of 10/08/2023.    Past Medical History:  Diagnosis Date   Anxiety    Elevated LFTs    History of colon polyps    HLD (hyperlipidemia)    HTN (hypertension)    Localized superficial swelling, mass, or lump    Stroke [facility])     Past Surgical History:  Procedure Laterality Date    CARDIOVERSION N/A 02/05/2019   Procedure: CARDIOVERSION;  Surgeon: Wendall Stade, MD;  Location: [facility] ENDOSCOPY;  Service: Cardiovascular;  Laterality: N/A;   CERVICAL DISC SURGERY     IR CT HEAD LTD  03/12/2021   IR PERCUTANEOUS ART THROMBECTOMY/INFUSION INTRACRANIAL INC DIAG ANGIO  03/12/2021   IR US GUIDE VASC ACCESS RIGHT  03/15/2021   LEG SURGERY     right leg and ankle   NECK SURGERY     cyst removed   RADIOLOGY WITH ANESTHESIA N/A 03/12/2021   Procedure: RADIOLOGY WITH ANESTHESIA;  Surgeon: Radiologist, Medication, MD;  Location: [facility] OR;  Service: Radiology;  Laterality: N/A;   TEE WITHOUT CARDIOVERSION N/A 02/05/2019   Procedure: TRANSESOPHAGEAL ECHOCARDIOGRAM (TEE);  Surgeon: Wendall Stade, MD;  Location: [facility] ENDOSCOPY;  Service: Cardiovascular;  Laterality: N/A;   TOTAL HIP ARTHROPLASTY     left    Family History  Problem Relation Age of Onset   Hepatitis Mother    Clotting disorder Mother    Stroke Mother    Thyroid disease Mother    Colon cancer Father        ?   Rheumatic fever Brother    Heart disease Brother     Social History   Socioeconomic History   Marital status: Married    Spouse name: Not on file   Number of children: 2  Years of education: Not on file   Highest education level: Not on file  Occupational History   Occupation: disabled    Employer: SELF-EMPLOYED  Tobacco Use   Smoking status: Every Day   Smokeless tobacco: Never  Vaping Use   Vaping status: Never Used  Substance and Sexual Activity   Alcohol use: Yes    Comment: 3-4 a day   Drug use: No   Sexual activity: Not on file  Other Topics Concern   Not on file  Social History Narrative   Not on file   Social Drivers of Health   Financial Resource Strain: Low Risk  (03/21/2023)   Overall Financial Resource Strain (CARDIA)    Difficulty of Paying Living Expenses: Not very hard  Food Insecurity: Food Insecurity Present (03/21/2023)   Hunger Vital Sign    Worried About Running Out of  Food in the Last Year: Sometimes true    Ran Out of Food in the Last Year: Sometimes true  Transportation Needs: No Transportation Needs (03/21/2023)   PRAPARE - Administrator, Civil Service (Medical): No    Lack of Transportation (Non-Medical): No  Physical Activity: Inactive (03/21/2023)   Exercise Vital Sign    Days of Exercise per Week: 0 days    Minutes of Exercise per Session: 0 min  Stress: No Stress Concern Present (03/21/2023)   [employer] of Occupational Health - Occupational Stress Questionnaire    Feeling of Stress : Not at all  Social Connections: Unknown (03/21/2023)   Social Connection and Isolation Panel [NHANES]    Frequency of Communication with Friends and Family: Three times a week    Frequency of Social Gatherings with Friends and Family: Three times a week    Attends Religious Services: More than 4 times per year    Active Member of Clubs or Organizations: No    Attends Banker Meetings: Never    Marital Status: Not on file  Intimate Partner Violence: Not At Risk (03/21/2023)   Humiliation, Afraid, Rape, and Kick questionnaire    Fear of Current or Ex-Partner: No    Emotionally Abused: No    Physically Abused: No    Sexually Abused: No    Review of Systems  All other systems reviewed and are negative.       Objective    BP 139/86   Pulse 99   Temp 97.8 F (36.6 C) (Oral)   Resp 18   Ht 6' (1.829 m)   Wt 198 lb 6.4 oz (90 kg)   SpO2 95%   BMI 26.91 kg/m   Physical Exam Vitals and nursing note reviewed.  Constitutional:      General: He is not in acute distress. Cardiovascular:     Rate and Rhythm: Normal rate and regular rhythm.  Pulmonary:     Effort: Pulmonary effort is normal.     Breath sounds: Normal breath sounds.  Abdominal:     Palpations: Abdomen is soft.     Tenderness: There is no abdominal tenderness.  Musculoskeletal:     Left knee: Bony tenderness present. No deformity. Decreased range of  motion. Tenderness present.  Neurological:     General: No focal deficit present.     Mental Status: He is alert and oriented to person, place, and time.         Assessment & Plan:  1. Acute pain of left knee (Primary) Patient deferred referral and eval for option to go to ortho urgent  care.   2. Encounter for completion of form with patient Form completed. Patient appears medically stable to undergo colonoscopy. Will stop eliquis 2 days prior to procedure.      No follow-ups on file.   Tommie Raymond, MD

## 2023-10-10 NOTE — Progress Notes (Signed)
 Office Visit Note   Patient: Patrick Padilla           Date of Birth: [birth year]           MRN: [id number] Visit Date: 10/10/2023              Requested by: Georganna Skeans, MD [id number] [place],  [place] [address] PCP: Georganna Skeans, MD   Assessment & Plan: Visit Diagnoses: Left knee pain   Plan: Pleasant 68 year old gentleman with a 4-week history of left medial knee pain.  He said this occurred about 4 weeks ago when he was horse playing with his son and his son kicked him on the lateral side.  He denies any twisting clicking shifting of the knee.  He denies any big effusion or swelling.  He continues to have limiting pain in the medial side of his knee which causes him difficulty walking.  Ligamentously I think he is stable cannot rule out a meniscus tear he does have some arthritic changes medially.  Offered him an MRI versus trying a steroid injection today.  He would like to try the injection if he does not get better he will contact me  Follow-Up Instructions: No follow-ups on file.   Orders:  Orders Placed This Encounter  Procedures  . XR KNEE 3 VIEW LEFT   No orders of the defined types were placed in this encounter.     Procedures: Large Joint Inj: L knee on 10/10/2023 2:31 PM Indications: pain and diagnostic evaluation Details: 25 G 1.5 in needle, anteromedial approach  Arthrogram: No  Medications: 40 mg methylPREDNISolone acetate 40 MG/ML; 3 mL lidocaine 1 % Outcome: tolerated well, no immediate complications Procedure, treatment alternatives, risks and benefits explained, specific risks discussed. Consent was given by the patient.     Clinical Data: No additional findings.   Subjective: No chief complaint on file.   HPI Keenen is a pleasant 68 year old gentleman who comes in today with a 4-week history of left knee pain.  He said he was playing with son and was accidentally kicked in the knee.  He has been taking Aleve he has not been icing or  wrapping when he goes to get up it is quite painful.  It has affected his gait Review of Systems  All other systems reviewed and are negative.    Objective: Vital Signs: There were no vitals taken for this visit.  Physical Exam Constitutional:      Appearance: Normal appearance.  Pulmonary:     Effort: Pulmonary effort is normal.  Skin:    General: Skin is warm and dry.  Neurological:     General: No focal deficit present.     Mental Status: He is alert and oriented to person, place, and time.  Psychiatric:        Mood and Affect: Mood normal.        Behavior: Behavior normal.    Ortho Exam  Specialty Comments:  No specialty comments available.  Imaging: XR KNEE 3 VIEW LEFT Result Date: 10/10/2023 Three-view radiographs of the left knee well-maintained alignment he has some sclerotic changes and joint space narrowing no acute fractures noted he does have a small effusion    PMFS History: Patient Active Problem List   Diagnosis Date Noted  . Family history of malignant neoplasm of digestive organs 07/19/2022  . Gout 07/19/2022  . Screening for malignant neoplasm of colon 07/19/2022  . Hypertension 07/19/2022  . Chronic left shoulder pain 06/22/2021  .  Dysphagia   . Leucocytosis   . Benign essential HTN   . Acute gout of right knee   . Acute ischemic right MCA stroke (HCC) 03/12/2021  . Alcohol abuse   . Seizure (HCC)   . Alcohol intake above recommended sensible limits 10/19/2020  . Tobacco dependence syndrome 10/19/2020  . Essential hypertension 12/26/2019  . Longstanding persistent atrial fibrillation (HCC)   . Atrial fibrillation with RVR (HCC) 02/03/2019  . Hyperlipidemia 02/03/2019  . Anxiety 02/03/2019  . History of gout 01/15/2018  . At risk for heart disease 08/25/2017  . Nondependent alcohol abuse, episodic drinking behavior 08/25/2017  . Tobacco abuse 08/25/2017  . Vitamin D deficiency 08/23/2017   Past Medical History:  Diagnosis Date  .  Anxiety   . Elevated LFTs   . History of colon polyps   . HLD (hyperlipidemia)   . HTN (hypertension)   . Localized superficial swelling, mass, or lump   . Stroke [facility])     Family History  Problem Relation Age of Onset  . Hepatitis Mother   . Clotting disorder Mother   . Stroke Mother   . Thyroid disease Mother   . Colon cancer Father        ?  Marland Kitchen Rheumatic fever Brother   . Heart disease Brother     Past Surgical History:  Procedure Laterality Date  . CARDIOVERSION N/A 02/05/2019   Procedure: CARDIOVERSION;  Surgeon: Wendall Stade, MD;  Location: [facility] ENDOSCOPY;  Service: Cardiovascular;  Laterality: N/A;  . CERVICAL DISC SURGERY    . IR CT HEAD LTD  03/12/2021  . IR PERCUTANEOUS ART THROMBECTOMY/INFUSION INTRACRANIAL INC DIAG ANGIO  03/12/2021  . IR US GUIDE VASC ACCESS RIGHT  03/15/2021  . LEG SURGERY     right leg and ankle  . NECK SURGERY     cyst removed  . RADIOLOGY WITH ANESTHESIA N/A 03/12/2021   Procedure: RADIOLOGY WITH ANESTHESIA;  Surgeon: Radiologist, Medication, MD;  Location: [facility] OR;  Service: Radiology;  Laterality: N/A;  . TEE WITHOUT CARDIOVERSION N/A 02/05/2019   Procedure: TRANSESOPHAGEAL ECHOCARDIOGRAM (TEE);  Surgeon: Wendall Stade, MD;  Location: [facility] ENDOSCOPY;  Service: Cardiovascular;  Laterality: N/A;  . TOTAL HIP ARTHROPLASTY     left   Social History   Occupational History  . Occupation: disabled    Employer: SELF-EMPLOYED  Tobacco Use  . Smoking status: Every Day  . Smokeless tobacco: Never  Vaping Use  . Vaping status: Never Used  Substance and Sexual Activity  . Alcohol use: Yes    Comment: 3-4 a day  . Drug use: No  . Sexual activity: Not on file

## 2023-10-10 NOTE — Telephone Encounter (Signed)
 Patient has already came in and had paper filled out.  I faxed it back.

## 2023-10-11 ENCOUNTER — Other Ambulatory Visit: Payer: Self-pay | Admitting: Family Medicine

## 2023-10-16 DIAGNOSIS — K621 Rectal polyp: Secondary | ICD-10-CM | POA: Diagnosis not present

## 2023-10-16 DIAGNOSIS — D124 Benign neoplasm of descending colon: Secondary | ICD-10-CM | POA: Diagnosis not present

## 2023-10-16 DIAGNOSIS — K635 Polyp of colon: Secondary | ICD-10-CM | POA: Diagnosis not present

## 2023-10-16 DIAGNOSIS — D123 Benign neoplasm of transverse colon: Secondary | ICD-10-CM | POA: Diagnosis not present

## 2023-10-16 DIAGNOSIS — D128 Benign neoplasm of rectum: Secondary | ICD-10-CM | POA: Diagnosis not present

## 2023-10-16 DIAGNOSIS — Z1211 Encounter for screening for malignant neoplasm of colon: Secondary | ICD-10-CM | POA: Diagnosis not present

## 2023-10-16 DIAGNOSIS — Z860100 Personal history of colon polyps, unspecified: Secondary | ICD-10-CM | POA: Diagnosis not present

## 2023-11-05 ENCOUNTER — Other Ambulatory Visit: Payer: Self-pay | Admitting: Family Medicine

## 2024-01-08 ENCOUNTER — Other Ambulatory Visit: Payer: Self-pay | Admitting: Family Medicine

## 2024-01-14 ENCOUNTER — Observation Stay (HOSPITAL_COMMUNITY): Payer: Medicare (Managed Care)

## 2024-01-14 ENCOUNTER — Other Ambulatory Visit: Payer: Self-pay

## 2024-01-14 ENCOUNTER — Emergency Department (HOSPITAL_COMMUNITY): Payer: Medicare (Managed Care)

## 2024-01-14 ENCOUNTER — Observation Stay (HOSPITAL_COMMUNITY)
Admission: EM | Admit: 2024-01-14 | Discharge: 2024-01-14 | Disposition: A | Discharge status: Home / Self Care | Payer: Medicare (Managed Care) | Attending: Internal Medicine | Admitting: Internal Medicine

## 2024-01-14 ENCOUNTER — Other Ambulatory Visit (HOSPITAL_COMMUNITY): Payer: Self-pay

## 2024-01-14 ENCOUNTER — Encounter (HOSPITAL_COMMUNITY): Payer: Self-pay

## 2024-01-14 DIAGNOSIS — D529 Folate deficiency anemia, unspecified: Secondary | ICD-10-CM | POA: Diagnosis not present

## 2024-01-14 DIAGNOSIS — Z96642 Presence of left artificial hip joint: Secondary | ICD-10-CM | POA: Insufficient documentation

## 2024-01-14 DIAGNOSIS — F32A Depression, unspecified: Secondary | ICD-10-CM | POA: Insufficient documentation

## 2024-01-14 DIAGNOSIS — I480 Paroxysmal atrial fibrillation: Secondary | ICD-10-CM | POA: Diagnosis not present

## 2024-01-14 DIAGNOSIS — F17200 Nicotine dependence, unspecified, uncomplicated: Secondary | ICD-10-CM | POA: Insufficient documentation

## 2024-01-14 DIAGNOSIS — F419 Anxiety disorder, unspecified: Secondary | ICD-10-CM | POA: Insufficient documentation

## 2024-01-14 DIAGNOSIS — I10 Essential (primary) hypertension: Secondary | ICD-10-CM | POA: Diagnosis not present

## 2024-01-14 DIAGNOSIS — H3410 Central retinal artery occlusion, unspecified eye: Secondary | ICD-10-CM | POA: Diagnosis present

## 2024-01-14 DIAGNOSIS — F17210 Nicotine dependence, cigarettes, uncomplicated: Secondary | ICD-10-CM | POA: Diagnosis not present

## 2024-01-14 DIAGNOSIS — E785 Hyperlipidemia, unspecified: Secondary | ICD-10-CM | POA: Insufficient documentation

## 2024-01-14 DIAGNOSIS — I639 Cerebral infarction, unspecified: Secondary | ICD-10-CM

## 2024-01-14 DIAGNOSIS — T45516A Underdosing of anticoagulants, initial encounter: Secondary | ICD-10-CM | POA: Diagnosis not present

## 2024-01-14 DIAGNOSIS — D750 Familial erythrocytosis: Secondary | ICD-10-CM | POA: Insufficient documentation

## 2024-01-14 DIAGNOSIS — H3412 Central retinal artery occlusion, left eye: Principal | ICD-10-CM | POA: Insufficient documentation

## 2024-01-14 DIAGNOSIS — Z79899 Other long term (current) drug therapy: Secondary | ICD-10-CM | POA: Diagnosis not present

## 2024-01-14 DIAGNOSIS — D519 Vitamin B12 deficiency anemia, unspecified: Secondary | ICD-10-CM | POA: Diagnosis not present

## 2024-01-14 DIAGNOSIS — G43109 Migraine with aura, not intractable, without status migrainosus: Secondary | ICD-10-CM

## 2024-01-14 DIAGNOSIS — D751 Secondary polycythemia: Secondary | ICD-10-CM | POA: Insufficient documentation

## 2024-01-14 DIAGNOSIS — R519 Headache, unspecified: Secondary | ICD-10-CM | POA: Diagnosis not present

## 2024-01-14 DIAGNOSIS — H5462 Unqualified visual loss, left eye, normal vision right eye: Secondary | ICD-10-CM | POA: Diagnosis present

## 2024-01-14 DIAGNOSIS — D7589 Other specified diseases of blood and blood-forming organs: Secondary | ICD-10-CM | POA: Insufficient documentation

## 2024-01-14 DIAGNOSIS — Z8673 Personal history of transient ischemic attack (TIA), and cerebral infarction without residual deficits: Secondary | ICD-10-CM | POA: Insufficient documentation

## 2024-01-14 DIAGNOSIS — Z7901 Long term (current) use of anticoagulants: Secondary | ICD-10-CM | POA: Diagnosis not present

## 2024-01-14 DIAGNOSIS — F10920 Alcohol use, unspecified with intoxication, uncomplicated: Secondary | ICD-10-CM

## 2024-01-14 DIAGNOSIS — F1010 Alcohol abuse, uncomplicated: Secondary | ICD-10-CM | POA: Diagnosis present

## 2024-01-14 DIAGNOSIS — Z91199 Patient's noncompliance with other medical treatment and regimen due to unspecified reason: Secondary | ICD-10-CM

## 2024-01-14 LAB — DIFFERENTIAL
Abs Immature Granulocytes: 0.03 K/uL (ref 0.00–0.07)
Basophils Absolute: 0.1 K/uL (ref 0.0–0.1)
Basophils Relative: 1 %
Eosinophils Absolute: 0.3 K/uL (ref 0.0–0.5)
Eosinophils Relative: 3 %
Immature Granulocytes: 0 %
Lymphocytes Relative: 35 %
Lymphs Abs: 3.3 K/uL (ref 0.7–4.0)
Monocytes Absolute: 0.4 K/uL (ref 0.1–1.0)
Monocytes Relative: 4 %
Neutro Abs: 5.5 K/uL (ref 1.7–7.7)
Neutrophils Relative %: 57 %

## 2024-01-14 LAB — COMPREHENSIVE METABOLIC PANEL WITH GFR
ALT: 17 U/L (ref 0–44)
ALT: 16 U/L (ref 0–44)
AST: 15 U/L (ref 15–41)
AST: 13 U/L — ABNORMAL LOW (ref 15–41)
Albumin: 3.3 g/dL — ABNORMAL LOW (ref 3.5–5.0)
Albumin: 3 g/dL — ABNORMAL LOW (ref 3.5–5.0)
Alkaline Phosphatase: 147 U/L — ABNORMAL HIGH (ref 38–126)
Alkaline Phosphatase: 136 U/L — ABNORMAL HIGH (ref 38–126)
Anion gap: 13 (ref 5–15)
Anion gap: 14 (ref 5–15)
BUN: 5 mg/dL — ABNORMAL LOW (ref 8–23)
BUN: <5 mg/dL — ABNORMAL LOW (ref 8–23)
CO2: 22 mmol/L (ref 22–32)
CO2: 22 mmol/L (ref 22–32)
Calcium: 9.1 mg/dL (ref 8.9–10.3)
Calcium: 8.8 mg/dL — ABNORMAL LOW (ref 8.9–10.3)
Chloride: 106 mmol/L (ref 98–111)
Chloride: 104 mmol/L (ref 98–111)
Creatinine, Ser: 1.07 mg/dL (ref 0.61–1.24)
Creatinine, Ser: 1.04 mg/dL (ref 0.61–1.24)
GFR, Estimated: >60 mL/min (ref >60)
GFR, Estimated: >60 mL/min (ref >60)
Glucose, Bld: 95 mg/dL (ref 70–99)
Glucose, Bld: 85 mg/dL (ref 70–99)
Potassium: 4.7 mmol/L (ref 3.5–5.1)
Potassium: 4.3 mmol/L (ref 3.5–5.1)
Sodium: 141 mmol/L (ref 135–145)
Sodium: 140 mmol/L (ref 135–145)
Total Bilirubin: 0.3 mg/dL (ref 0.0–1.2)
Total Bilirubin: 0.3 mg/dL (ref 0.0–1.2)
Total Protein: 6.4 g/dL — ABNORMAL LOW (ref 6.5–8.1)
Total Protein: 5.9 g/dL — ABNORMAL LOW (ref 6.5–8.1)

## 2024-01-14 LAB — I-STAT CHEM 8, ED
BUN: 6 mg/dL — ABNORMAL LOW (ref 8–23)
Calcium, Ion: 1.02 mmol/L — ABNORMAL LOW (ref 1.15–1.40)
Chloride: 106 mmol/L (ref 98–111)
Creatinine, Ser: 1.2 mg/dL (ref 0.61–1.24)
Glucose, Bld: 90 mg/dL (ref 70–99)
HCT: 54 % — ABNORMAL HIGH (ref 39.0–52.0)
Hemoglobin: 18.4 g/dL — ABNORMAL HIGH (ref 13.0–17.0)
Potassium: 4.6 mmol/L (ref 3.5–5.1)
Sodium: 140 mmol/L (ref 135–145)
TCO2: 24 mmol/L (ref 22–32)

## 2024-01-14 LAB — LIPID PANEL
Cholesterol: 182 mg/dL (ref 0–200)
HDL: 40 mg/dL — ABNORMAL LOW (ref >40)
LDL Cholesterol: 114 mg/dL — ABNORMAL HIGH (ref 0–99)
Total CHOL/HDL Ratio: 4.6 ratio
Triglycerides: 139 mg/dL (ref <150)
VLDL: 28 mg/dL (ref 0–40)

## 2024-01-14 LAB — ECHOCARDIOGRAM COMPLETE
AR max vel: 2.1 cm2
AV Area VTI: 2.57 cm2
AV Area mean vel: 1.98 cm2
AV Mean grad: 1.5 mmHg
AV Peak grad: 2.1 mmHg
Ao pk vel: 0.73 m/s
Area-P 1/2: 4.8 cm2
Height: 72 in
S' Lateral: 3.1 cm
Weight: 3200 [oz_av]

## 2024-01-14 LAB — CBC
HCT: 52.3 % — ABNORMAL HIGH (ref 39.0–52.0)
HCT: 49 % (ref 39.0–52.0)
Hemoglobin: 17.9 g/dL — ABNORMAL HIGH (ref 13.0–17.0)
Hemoglobin: 16.9 g/dL (ref 13.0–17.0)
MCH: 35.4 pg — ABNORMAL HIGH (ref 26.0–34.0)
MCH: 35.4 pg — ABNORMAL HIGH (ref 26.0–34.0)
MCHC: 34.2 g/dL (ref 30.0–36.0)
MCHC: 34.5 g/dL (ref 30.0–36.0)
MCV: 103.4 fL — ABNORMAL HIGH (ref 80.0–100.0)
MCV: 102.5 fL — ABNORMAL HIGH (ref 80.0–100.0)
Platelets: 325 K/uL (ref 150–400)
Platelets: 322 K/uL (ref 150–400)
RBC: 5.06 MIL/uL (ref 4.22–5.81)
RBC: 4.78 MIL/uL (ref 4.22–5.81)
RDW: 12.6 % (ref 11.5–15.5)
RDW: 12.6 % (ref 11.5–15.5)
WBC: 9.6 K/uL (ref 4.0–10.5)
WBC: 8.7 K/uL (ref 4.0–10.5)
nRBC: 0 % (ref 0.0–0.2)
nRBC: 0 % (ref 0.0–0.2)

## 2024-01-14 LAB — HIV ANTIBODY (ROUTINE TESTING W REFLEX): HIV Screen 4th Generation wRfx: NONREACTIVE

## 2024-01-14 LAB — SEDIMENTATION RATE: Sed Rate: 2 mm/h (ref 0–16)

## 2024-01-14 LAB — PROTIME-INR
INR: 1 (ref 0.8–1.2)
Prothrombin Time: 14.1 s (ref 11.4–15.2)

## 2024-01-14 LAB — HEMOGLOBIN A1C
Hgb A1c MFr Bld: 5.5 % (ref 4.8–5.6)
Mean Plasma Glucose: 111.15 mg/dL

## 2024-01-14 LAB — CBG MONITORING, ED: Glucose-Capillary: 107 mg/dL — ABNORMAL HIGH (ref 70–99)

## 2024-01-14 LAB — C-REACTIVE PROTEIN: CRP: 2.3 mg/dL — ABNORMAL HIGH (ref <1.0)

## 2024-01-14 LAB — FOLATE: Folate: 5.6 ng/mL — ABNORMAL LOW (ref >5.9)

## 2024-01-14 LAB — APTT: aPTT: 35 s (ref 24–36)

## 2024-01-14 LAB — ETHANOL: Alcohol, Ethyl (B): 209 mg/dL — ABNORMAL HIGH (ref <15)

## 2024-01-14 LAB — VITAMIN B12: Vitamin B-12: 165 pg/mL — ABNORMAL LOW (ref 180–914)

## 2024-01-14 MED ORDER — TELMISARTAN 40 MG PO TABS: 40.0000 mg | ORAL_TABLET | Freq: Every day | ORAL | 0 refills | Status: DC

## 2024-01-14 MED ORDER — FOLIC ACID 1 MG PO TABS: 1.0000 mg | ORAL_TABLET | Freq: Every day | ORAL | 0 refills | Status: AC

## 2024-01-14 MED ORDER — IRBESARTAN 300 MG PO TABS
150.0000 mg | ORAL_TABLET | Freq: Every day | ORAL | Status: DC
  Administered 2024-01-14: 150 mg via ORAL
  Filled 2024-01-14: qty 1

## 2024-01-14 MED ORDER — ACETAMINOPHEN 650 MG RE SUPP: 650.0000 mg | RECTAL | Status: DC | PRN

## 2024-01-14 MED ORDER — THIAMINE HCL 100 MG/ML IJ SOLN: 100.0000 mg | Freq: Every day | INTRAMUSCULAR | Status: DC

## 2024-01-14 MED ORDER — FOLIC ACID 1 MG PO TABS
1.0000 mg | ORAL_TABLET | Freq: Every day | ORAL | Status: DC
  Administered 2024-01-14: 1 mg via ORAL
  Filled 2024-01-14: qty 1

## 2024-01-14 MED ORDER — ATORVASTATIN CALCIUM 40 MG PO TABS: 40.0000 mg | ORAL_TABLET | Freq: Every day | ORAL | 0 refills | Status: DC

## 2024-01-14 MED ORDER — CYANOCOBALAMIN 1000 MCG PO TABS: 1000.0000 ug | ORAL_TABLET | Freq: Every day | ORAL | 0 refills | Status: AC

## 2024-01-14 MED ORDER — STROKE: EARLY STAGES OF RECOVERY BOOK: Freq: Once | Status: DC

## 2024-01-14 MED ORDER — ACETAMINOPHEN 325 MG PO TABS: 650.0000 mg | ORAL_TABLET | ORAL | Status: DC | PRN

## 2024-01-14 MED ORDER — METHOCARBAMOL 500 MG PO TABS: 500.0000 mg | ORAL_TABLET | Freq: Every evening | ORAL | 0 refills | Status: AC | PRN

## 2024-01-14 MED ORDER — IOHEXOL 350 MG/ML SOLN
75.0000 mL | Freq: Once | INTRAVENOUS | Status: AC | PRN
  Administered 2024-01-14: 75 mL via INTRAVENOUS

## 2024-01-14 MED ORDER — CYANOCOBALAMIN 1000 MCG/ML IJ SOLN
1000.0000 ug | Freq: Once | INTRAMUSCULAR | Status: AC
  Administered 2024-01-14: 1000 ug via INTRAMUSCULAR
  Filled 2024-01-14: qty 1

## 2024-01-14 MED ORDER — LORAZEPAM 1 MG PO TABS: 1.0000 mg | ORAL_TABLET | ORAL | Status: DC | PRN

## 2024-01-14 MED ORDER — PAROXETINE HCL 30 MG PO TABS
30.0000 mg | ORAL_TABLET | Freq: Every day | ORAL | Status: DC
  Administered 2024-01-14: 30 mg via ORAL
  Filled 2024-01-14: qty 1

## 2024-01-14 MED ORDER — APIXABAN 5 MG PO TABS: 5.0000 mg | ORAL_TABLET | Freq: Two times a day (BID) | ORAL | 0 refills | Status: DC

## 2024-01-14 MED ORDER — APIXABAN 5 MG PO TABS
5.0000 mg | ORAL_TABLET | Freq: Two times a day (BID) | ORAL | Status: DC
  Administered 2024-01-14: 5 mg via ORAL
  Filled 2024-01-14: qty 1

## 2024-01-14 MED ORDER — SODIUM CHLORIDE 0.9% FLUSH
3.0000 mL | Freq: Once | INTRAVENOUS | Status: AC
  Administered 2024-01-14: 3 mL via INTRAVENOUS

## 2024-01-14 MED ORDER — ADULT MULTIVITAMIN W/MINERALS CH
1.0000 | ORAL_TABLET | Freq: Every day | ORAL | Status: DC
  Administered 2024-01-14: 1 via ORAL
  Filled 2024-01-14: qty 1

## 2024-01-14 MED ORDER — ATORVASTATIN CALCIUM 40 MG PO TABS
40.0000 mg | ORAL_TABLET | Freq: Every day | ORAL | Status: DC
  Administered 2024-01-14: 40 mg
  Filled 2024-01-14: qty 1

## 2024-01-14 MED ORDER — SODIUM CHLORIDE 0.9 % IV SOLN: INTRAVENOUS | Status: DC

## 2024-01-14 MED ORDER — PAROXETINE HCL 30 MG PO TABS: 30.0000 mg | ORAL_TABLET | Freq: Every day | ORAL | 0 refills | Status: DC

## 2024-01-14 MED ORDER — THIAMINE MONONITRATE 100 MG PO TABS
100.0000 mg | ORAL_TABLET | Freq: Every day | ORAL | Status: DC
  Administered 2024-01-14: 100 mg via ORAL
  Filled 2024-01-14: qty 1

## 2024-01-14 MED ORDER — ACETAMINOPHEN 160 MG/5ML PO SOLN: 650.0000 mg | ORAL | Status: DC | PRN

## 2024-01-14 MED ORDER — LORAZEPAM 2 MG/ML IJ SOLN: 1.0000 mg | INTRAMUSCULAR | Status: DC | PRN

## 2024-01-14 MED ORDER — VITAMIN B-12 1000 MCG PO TABS: 1000.0000 ug | ORAL_TABLET | Freq: Every day | ORAL | Status: DC

## 2024-01-14 NOTE — Consult Note (Addendum)
 NEUROLOGY CONSULT NOTE   Date of service: January 14, 2024 Patient Name: Patrick Padilla MRN:  [id number] DOB:  1956-07-03 Chief Complaint: L eye vision loss Requesting Provider: Nettie Earing, MD  History of Present Illness  Patrick Padilla is a 68 y.o. male with hx of Htn, HLD, afibb on eliquis , prior stroke with residual mild left sided weakness, daily smoker, who presents with complete left eye monocular vision loss.  Patient reports that he was watching TV at 0130 on 01/14/2024 when he had sudden onset vision loss in his left eye.  He talked to his son who called EMS and he was brought in as a code stroke.  He reports taking Eliquis  5 mg only once a day instead of his prescribed 5 mg twice daily.  He took his Eliquis  at 10 AM on 01/13/2024.  He smokes, goes through a pack a day.  When asked why he is not taking his Eliquis  as prescribed, he tells me that he is a [employer] cabin crew and it costs him 54 dollars a month to get his eliquis  so he tries to get it to last longer.  LKW: 0130 on 01/14/2024. Modified rankin score: 0-Completely asymptomatic and back to baseline post- stroke IV Thrombolysis: Not offered, patient took Eliquis  within the last 48 hours.   EVT: Not offered, no LVO.    NIHSS components Score: Comment  1a Level of Conscious 0[]  1[]  2[]  3[]      1b LOC Questions 0[]  1[]  2[]       1c LOC Commands 0[]  1[]  2[]       2 Best Gaze 0[]  1[]  2[]       3 Visual 0[]  1[]  2[]  3[]      4 Facial Palsy 0[]  1[]  2[]  3[]      5a Motor Arm - left 0[]  1[]  2[]  3[]  4[]  UN[]    5b Motor Arm - Right 0[]  1[]  2[]  3[]  4[]  UN[]    6a Motor Leg - Left 0[]  1[]  2[]  3[]  4[]  UN[]    6b Motor Leg - Right 0[]  1[]  2[]  3[]  4[]  UN[]    7 Limb Ataxia 0[]  1[]  2[]  UN[]      8 Sensory 0[]  1[]  2[]  UN[]      9 Best Language 0[]  1[]  2[]  3[]      10 Dysarthria 0[]  1[]  2[]  UN[]      11 Extinct. and Inattention 0[]  1[]  2[]       TOTAL: 0      ROS  Comprehensive ROS performed and pertinent positives documented in HPI   Past History    Past Medical History:  Diagnosis Date   Anxiety    Elevated LFTs    History of colon polyps    HLD (hyperlipidemia)    HTN (hypertension)    Localized superficial swelling, mass, or lump    Stroke [facility])     Past Surgical History:  Procedure Laterality Date   CARDIOVERSION N/A 02/05/2019   Procedure: CARDIOVERSION;  Surgeon: Delford Maude BROCKS, MD;  Location: [facility] ENDOSCOPY;  Service: Cardiovascular;  Laterality: N/A;   CERVICAL DISC SURGERY     IR CT HEAD LTD  03/12/2021   IR PERCUTANEOUS ART THROMBECTOMY/INFUSION INTRACRANIAL INC DIAG ANGIO  03/12/2021   IR US  GUIDE VASC ACCESS RIGHT  03/15/2021   LEG SURGERY     right leg and ankle   NECK SURGERY     cyst removed   RADIOLOGY WITH ANESTHESIA N/A 03/12/2021   Procedure: RADIOLOGY WITH ANESTHESIA;  Surgeon: Radiologist, Medication, MD;  Location: [facility] OR;  Service: Radiology;  Laterality:  N/A;   TEE WITHOUT CARDIOVERSION N/A 02/05/2019   Procedure: TRANSESOPHAGEAL ECHOCARDIOGRAM (TEE);  Surgeon: Delford Maude BROCKS, MD;  Location: [facility] ENDOSCOPY;  Service: Cardiovascular;  Laterality: N/A;   TOTAL HIP ARTHROPLASTY     left    Family History: Family History  Problem Relation Age of Onset   Hepatitis Mother    Clotting disorder Mother    Stroke Mother    Thyroid disease Mother    Colon cancer Father        ?   Rheumatic fever Brother    Heart disease Brother     Social History  reports that he has been smoking. He has never used smokeless tobacco. He reports current alcohol use. He reports that he does not use drugs.  No Known Allergies  Medications   Current Facility-Administered Medications:    sodium chloride  flush (NS) 0.9 % injection 3 mL, 3 mL, Intravenous, Once, Palumbo, April, MD  Current Outpatient Medications:    baclofen  (LIORESAL ) 10 MG tablet, Take 1 tablet (10 mg total) by mouth 2 (two) times daily., Disp: 60 tablet, Rfl: 2   ELIQUIS  5 MG TABS tablet, Take 1 tablet by mouth twice daily, Disp: 60 tablet, Rfl: 0   folic  acid (FOLVITE ) 1 MG tablet, Take 1 tablet (1 mg total) by mouth daily. (Patient not taking: Reported on 10/08/2023), Disp: 30 tablet, Rfl: 3   Multiple Vitamin (MULTIVITAMIN WITH MINERALS) TABS tablet, Take 1 tablet by mouth daily., Disp: 30 tablet, Rfl: 3   PARoxetine  (PAXIL ) 30 MG tablet, Take 1 tablet by mouth once daily, Disp: 90 tablet, Rfl: 0   Pediatric Multivitamins-Fl (MULTIVITAMIN + FLUORIDE) 0.25 MG CHEW, Chew 1 tablet by mouth daily. (Patient not taking: Reported on 10/08/2023), Disp: , Rfl:    simvastatin (ZOCOR) 20 MG tablet, Take 1 tablet by mouth daily., Disp: , Rfl:    telmisartan  (MICARDIS ) 40 MG tablet, Take 1 tablet by mouth once daily, Disp: 90 tablet, Rfl: 0   traZODone  (DESYREL ) 100 MG tablet, Take 1 tablet (100 mg total) by mouth at bedtime. (Patient not taking: Reported on 10/08/2023), Disp: 90 tablet, Rfl: 1   traZODone  (DESYREL ) 50 MG tablet, Take 1 tablet (50 mg total) by mouth at bedtime. (Patient not taking: Reported on 10/08/2023), Disp: 90 tablet, Rfl: 0  Vitals   Vitals:   01/14/24 0200 01/14/24 0312 01/14/24 0314  BP:  127/77   Pulse:  89   Resp:  20   Temp:  98.2 F (36.8 C)   TempSrc:  Oral   SpO2:  96%   Weight: 91.4 kg  90.7 kg  Height: 6' (1.829 m)  6' (1.829 m)    Body mass index is 27.12 kg/m.   Physical Exam   General: Laying comfortably in bed; in no acute distress.  HENT: Normal oropharynx and mucosa. Normal external appearance of ears and nose.  Neck: Supple, no pain or tenderness  CV: No JVD. No peripheral edema.  Pulmonary: Symmetric Chest rise. Normal respiratory effort.  Abdomen: Soft to touch, non-tender.  Ext: No cyanosis, edema, or deformity  Skin: No rash. Normal palpation of skin.   Musculoskeletal: Normal digits and nails by inspection. No clubbing.   Neurologic Examination  Mental status/Cognition: Alert, oriented to self, place, month and year, good attention.  Speech/language: Fluent, comprehension intact, object naming  intact, repetition intact.  Cranial nerves:   CN II Left APD, complete left eye monocular vision loss.   CN III,IV,VI EOM intact, no gaze preference  or deviation, no nystagmus    CN V normal sensation in V1, V2, and V3 segments bilaterally    CN VII no asymmetry, no nasolabial fold flattening    CN VIII normal hearing to speech    CN IX & X normal palatal elevation, no uvular deviation    CN XI 5/5 head turn and 5/5 shoulder shrug bilaterally    CN XII midline tongue protrusion    Motor:  Muscle bulk: normal, tone normal, pronator drift none tremor none Mvmt Root Nerve  Muscle Right Left Comments  SA C5/6 Ax Deltoid 5 5   EF C5/6 Mc Biceps 5 5   EE C6/7/8 Rad Triceps 5 5   WF C6/7 Med FCR     WE C7/8 PIN ECU     F Ab C8/T1 U ADM/FDI 5 5   HF L1/2/3 Fem Illopsoas 5 5   KE L2/3/4 Fem Quad 5 5   DF L4/5 D Peron Tib Ant 5 5   PF S1/2 Tibial Grc/Sol 5 5    Sensation:  Light touch Intact throughout   Pin prick    Temperature    Vibration   Proprioception    Coordination/Complex Motor:  - Finger to Nose intact bilaterally - Heel to shin intact bilaterally - Rapid alternating movement are normal - Gait: Deferred for patient safety. Labs/Imaging/Neurodiagnostic studies   CBC:  Recent Labs  Lab 01-29-2024 0253 01-29-2024 0258  WBC 9.6  --   NEUTROABS 5.5  --   HGB 17.9* 18.4*  HCT 52.3* 54.0*  MCV 103.4*  --   PLT 325  --    Basic Metabolic Panel:  Lab Results  Component Value Date   NA 140 2024/01/29   K 4.6 January 29, 2024   CO2 25 04/02/2021   GLUCOSE 90 01/29/24   BUN 6 (L) 2024-01-29   CREATININE 1.20 01-29-2024   CALCIUM  9.1 04/02/2021   GFRNONAA >60 04/02/2021   GFRAA >60 08/27/2019   Lipid Panel:  Lab Results  Component Value Date   LDLCALC 97 03/13/2021   HgbA1c:  Lab Results  Component Value Date   HGBA1C 5.4 03/13/2021   Urine Drug Screen: No results found for: LABOPIA, COCAINSCRNUR, LABBENZ, AMPHETMU, THCU, LABBARB  Alcohol Level      Component Value Date/Time   ETH <10 03/12/2021 1540   INR  Lab Results  Component Value Date   INR 1.0 Jan 29, 2024   APTT  Lab Results  Component Value Date   APTT 35 29-Jan-2024   AED levels: No results found for: PHENYTOIN, ZONISAMIDE, LAMOTRIGINE, LEVETIRACETA  CT Head without contrast(Personally reviewed): CTH was negative for a large hypodensity concerning for a large territory infarct or hyperdensity concerning for an ICH  CT angio Head and Neck with contrast(Personally reviewed): No LVO.  MRI Brain: Pending   ASSESSMENT   Patrick Padilla is a 68 y.o. male with hx of Htn, HLD, afibb on eliquis , prior stroke with residual mild left sided weakness, daily smoker, who presents with complete left eye monocular vision loss.  Neuroexam the left APD and no vision of left eye.  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  He is not a candidate for TNKase as he took his Eliquis  within the last 48 hours.  He is not a candidate for thrombectomy due to no LVO.  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  RECOMMENDATIONS  - Frequent Neuro checks per stroke unit protocol - Recommend brain imaging with MRI Brain without contrast -No need  for TTE as he hjas known afibb. - Recommend obtaining Lipid panel with LDL - Please start statin if LDL > 70 - Recommend HbA1c to evaluate for diabetes and how well it is controlled. - Start Eliquis  5 mg twice daily - SBP goal - < 180 systolic - Recommend Telemetry monitoring for arrythmia - Recommend bedside swallow screen prior to PO intake. - Stroke education booklet - Recommend PT/OT/SLP consult ______________________________________________________________________  Plan discussed with patient at bedside.  Plan also discussed with Dr. Nettie with the ED team.  Signed, Baley Lorimer, MD [facility]

## 2024-01-14 NOTE — Progress Notes (Addendum)
 STROKE TEAM PROGRESS NOTE   INTERIM HISTORY/SUBJECTIVE  No family at the bedside.  Patient laying in the bed in no apparent distress.  Patient has A-fib and is on Eliquis  however he only takes it once a day due to trying to save money.  He also smokes cigarettes which he is not ready to quit   CBC    Component Value Date/Time   WBC 8.7 01/14/2024 0618   RBC 4.78 01/14/2024 0618   HGB 16.9 01/14/2024 0618   HCT 49.0 01/14/2024 0618   PLT 322 01/14/2024 0618   MCV 102.5 (H) 01/14/2024 0618   MCH 35.4 (H) 01/14/2024 0618   MCHC 34.5 01/14/2024 0618   RDW 12.6 01/14/2024 0618   LYMPHSABS 3.3 01/14/2024 0253   MONOABS 0.4 01/14/2024 0253   EOSABS 0.3 01/14/2024 0253   BASOSABS 0.1 01/14/2024 0253    BMET    Component Value Date/Time   NA 140 01/14/2024 0618   K 4.3 01/14/2024 0618   CL 104 01/14/2024 0618   CO2 22 01/14/2024 0618   GLUCOSE 85 01/14/2024 0618   BUN <5 (L) 01/14/2024 0618   CREATININE 1.04 01/14/2024 0618   CALCIUM  8.8 (L) 01/14/2024 0618   GFRNONAA >60 01/14/2024 0618    IMAGING past 24 hours ECHOCARDIOGRAM COMPLETE Result Date: 01/14/2024    ECHOCARDIOGRAM REPORT   Patient Name:   Patrick Padilla Date of Exam: 01/14/2024 Medical Rec #:  [id number]   Height:       72.0 in Accession #:    [id number]  Weight:       200.0 lb Date of Birth:  January 08, 1956   BSA:          2.131 m Patient Age:    67 years    BP:           162/105 mmHg Patient Gender: M           HR:           103 bpm. Exam Location:  Inpatient Procedure: 2D Echo, Color Doppler and Cardiac Doppler (Both Spectral and Color            Flow Doppler were utilized during procedure). Indications:    Stroke  History:        Patient has prior history of Echocardiogram examinations, most                 recent 04/03/2021. Risk Factors:Current Smoker and Hypertension.  Sonographer:    Benard Stallion Referring Phys: ARY CUMMINS IMPRESSIONS  1. Left ventricular ejection fraction, by estimation, is 55 to 60%. The left ventricle  has normal function. The left ventricle has no regional wall motion abnormalities. Left ventricular diastolic parameters were normal.  2. Right ventricular systolic function is normal. The right ventricular size is normal.  3. Left atrial size was mildly dilated.  4. Right atrial size was mildly dilated.  5. The mitral valve is abnormal. Trivial mitral valve regurgitation. No evidence of mitral stenosis.  6. The aortic valve is tricuspid. There is mild calcification of the aortic valve. Aortic valve regurgitation is not visualized. Aortic valve sclerosis is present, with no evidence of aortic valve stenosis.  7. The inferior vena cava is normal in size with greater than 50% respiratory variability, suggesting right atrial pressure of 3 mmHg. FINDINGS  Left Ventricle: Left ventricular ejection fraction, by estimation, is 55 to 60%. The left ventricle has normal function. The left ventricle has no regional wall motion abnormalities. Strain was performed  and the global longitudinal strain is indeterminate. The left ventricular internal cavity size was normal in size. There is no left ventricular hypertrophy. Left ventricular diastolic parameters were normal. Right Ventricle: The right ventricular size is normal. No increase in right ventricular wall thickness. Right ventricular systolic function is normal. Left Atrium: Left atrial size was mildly dilated. Right Atrium: Right atrial size was mildly dilated. Pericardium: There is no evidence of pericardial effusion. Mitral Valve: The mitral valve is abnormal. There is mild thickening of the mitral valve leaflet(s). There is mild calcification of the mitral valve leaflet(s). Trivial mitral valve regurgitation. No evidence of mitral valve stenosis. Tricuspid Valve: The tricuspid valve is normal in structure. Tricuspid valve regurgitation is trivial. No evidence of tricuspid stenosis. Aortic Valve: The aortic valve is tricuspid. There is mild calcification of the aortic  valve. Aortic valve regurgitation is not visualized. Aortic valve sclerosis is present, with no evidence of aortic valve stenosis. Aortic valve mean gradient measures 1.5 mmHg. Aortic valve peak gradient measures 2.1 mmHg. Aortic valve area, by VTI measures 2.57 cm. Pulmonic Valve: The pulmonic valve was normal in structure. Pulmonic valve regurgitation is not visualized. No evidence of pulmonic stenosis. Aorta: The aortic root is normal in size and structure. Venous: The inferior vena cava is normal in size with greater than 50% respiratory variability, suggesting right atrial pressure of 3 mmHg. IAS/Shunts: No atrial level shunt detected by color flow Doppler. Additional Comments: 3D was performed not requiring image post processing on an independent workstation and was indeterminate.  LEFT VENTRICLE PLAX 2D LVIDd:         4.30 cm   Diastology LVIDs:         3.10 cm   LV e' medial:    3.92 cm/s LV PW:         1.10 cm   LV E/e' medial:  19.6 LV IVS:        1.10 cm   LV e' lateral:   7.18 cm/s LVOT diam:     2.00 cm   LV E/e' lateral: 10.7 LV SV:         29 LV SV Index:   14 LVOT Area:     3.14 cm  RIGHT VENTRICLE RV S prime:     9.79 cm/s TAPSE (M-mode): 1.3 cm LEFT ATRIUM           Index LA diam:      4.20 cm 1.97 cm/m LA Vol (A4C): 42.1 ml 19.76 ml/m  AORTIC VALVE AV Area (Vmax):    2.10 cm AV Area (Vmean):   1.98 cm AV Area (VTI):     2.57 cm AV Vmax:           72.70 cm/s AV Vmean:          51.450 cm/s AV VTI:            0.113 m AV Peak Grad:      2.1 mmHg AV Mean Grad:      1.5 mmHg LVOT Vmax:         48.70 cm/s LVOT Vmean:        32.500 cm/s LVOT VTI:          0.092 m LVOT/AV VTI ratio: 0.82  AORTA Ao Root diam: 3.40 cm Ao Asc diam:  3.10 cm MITRAL VALVE MV Area (PHT): 4.80 cm    SHUNTS MV Decel Time: 158 msec    Systemic VTI:  0.09 m MV E velocity: 77.00 cm/s  Systemic Diam: 2.00 cm MV A velocity: 31.40 cm/s MV E/A ratio:  2.45 Maude Emmer MD Electronically signed by Maude Emmer MD Signature  Date/Time: 01/14/2024/1:18:04 PM    Final    MR BRAIN WO CONTRAST Result Date: 01/14/2024 EXAM: MRI BRAIN WITHOUT CONTRAST 01/14/2024 07:37:26 AM TECHNIQUE: Multiplanar multisequence MRI of the head/brain was performed without the administration of intravenous contrast. COMPARISON: CT head without contrast and CT angio head and neck 01/14/2024. MR head without contrast 03/13/2021. CLINICAL HISTORY: Neuro deficit, acute, stroke suspected. Chief Complaint: Vision loss in left eye. FINDINGS: BRAIN AND VENTRICLES: No acute infarct. A remote right MCA infarct and remote right basal ganglia infarct demonstrate expected evolution from the prior MRI. No blood products are present. A focal area of susceptibility is present in the right parietal lobe posterior to the atrium of the right lateral ventricle. No acute hemorrhage is present. No mass. No midline shift. No hydrocephalus. The sella is unremarkable. Normal flow voids. ORBITS: No acute abnormality. SINUSES AND MASTOIDS: No acute abnormality. BONES AND SOFT TISSUES: Normal marrow signal. A 2 cm sebaceous cyst in the posterior left neck is stable to slightly increased in size. No associated inflammatory changes are present. IMPRESSION: 1. No acute infarct or hemorrhage. 2. 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 Electronically signed by: Lonni Necessary MD 01/14/2024 07:45 AM EDT RP Workstation: [id number]   CT ANGIO HEAD NECK W WO CM (CODE STROKE) Result Date: 01/14/2024 CLINICAL DATA:  Acute neurologic deficit EXAM: CT ANGIOGRAPHY HEAD AND NECK WITH AND WITHOUT CONTRAST TECHNIQUE: Multidetector CT imaging of the head and neck was performed using the standard protocol during bolus administration of intravenous contrast. Multiplanar CT image reconstructions and MIPs were obtained to evaluate the vascular anatomy. Carotid stenosis measurements (when applicable) are obtained utilizing NASCET criteria, using the distal  internal carotid diameter as the denominator. RADIATION DOSE REDUCTION: This exam was performed according to the departmental dose-optimization program which includes automated exposure control, adjustment of the mA and/or kV according to patient size and/or use of iterative reconstruction technique. CONTRAST:  75mL OMNIPAQUE  IOHEXOL  350 MG/ML SOLN COMPARISON:  None Available. FINDINGS: CTA NECK FINDINGS Skeleton: No acute abnormality or high grade bony spinal canal stenosis. C6-7 ACDF Other neck: Normal pharynx, larynx and major salivary glands. No cervical lymphadenopathy. Unremarkable thyroid gland. Upper chest: No pneumothorax or pleural effusion. No nodules or masses. Aortic arch: There is calcific atherosclerosis of the aortic arch. Conventional 3 vessel aortic branching pattern. RIGHT carotid system: No dissection, occlusion or aneurysm. Mild mixed density atherosclerosis at the carotid bifurcation and proximal ICA without hemodynamically significant stenosis. LEFT carotid system: No dissection, occlusion or aneurysm. Mild atherosclerotic calcification at the carotid bifurcation without hemodynamically significant stenosis. Vertebral arteries: Left dominant configuration. There is no dissection, occlusion or flow-limiting stenosis to the skull base (V1-V3 segments). CTA HEAD FINDINGS POSTERIOR CIRCULATION: Vertebral arteries are normal. No proximal occlusion of the anterior or inferior cerebellar arteries. Basilar artery is normal. Superior cerebellar arteries are normal. Posterior cerebral arteries are normal. ANTERIOR CIRCULATION: Atherosclerotic calcification of the internal carotid arteries at the skull base without hemodynamically significant stenosis. Anterior cerebral arteries are normal. Middle cerebral arteries are normal. Venous sinuses: As permitted by contrast timing, patent. Anatomic variants: None Review of the MIP images confirms the above findings. IMPRESSION: 1. No emergent large vessel  occlusion or hemodynamically significant stenosis of the head or neck. 2. Mild bilateral carotid bifurcation atherosclerosis without hemodynamically significant stenosis. Aortic Atherosclerosis (  ICD10-I70.0). Electronically Signed   By: Franky Stanford M.D.   On: 01/14/2024 03:17   CT HEAD CODE STROKE WO CONTRAST Result Date: 01/14/2024 CLINICAL DATA:  Code stroke.  Acute neurologic deficit EXAM: CT HEAD WITHOUT CONTRAST TECHNIQUE: Contiguous axial images were obtained from the base of the skull through the vertex without intravenous contrast. RADIATION DOSE REDUCTION: This exam was performed according to the departmental dose-optimization program which includes automated exposure control, adjustment of the mA and/or kV according to patient size and/or use of iterative reconstruction technique. COMPARISON:  03/12/2021 FINDINGS: Brain: There is no mass, hemorrhage or extra-axial collection. There is generalized atrophy without lobar predilection. Old right frontal infarct infarcts. Vascular: No abnormal hyperdensity of the major intracranial arteries or dural venous sinuses. No intracranial atherosclerosis. Skull: The visualized skull base, calvarium and extracranial soft tissues are normal. Sinuses/Orbits: No fluid levels or advanced mucosal thickening of the visualized paranasal sinuses. No mastoid or middle ear effusion. The orbits are normal. ASPECTS [facility] Stroke Program Early CT Score) - Ganglionic level infarction (caudate, lentiform nuclei, internal capsule, insula, M1-M3 cortex): 7 - Supraganglionic infarction (M4-M6 cortex): 3 Total score (0-10 with 10 being normal): 10 IMPRESSION: 1. No acute intracranial abnormality. 2. Old right frontal infarct. 3. ASPECTS is 10. These results were communicated to Dr. Salman Khaliqdina at 3:02 am on 01/14/2024 by text page via the [facility] messaging system. Electronically Signed   By: Franky Stanford M.D.   On: 01/14/2024 03:03    Vitals:   01/14/24 1015 01/14/24 1045  01/14/24 1345 01/14/24 1426  BP: (!) 162/105  (!) 152/94   Pulse: 91 94 100   Resp: 17 16 17    Temp:    (!) 96.7 F (35.9 C)  TempSrc:    Temporal  SpO2: 97% 97% 96%   Weight:      Height:         PHYSICAL EXAM General:  Alert, well-nourished, well-developed patient in no acute distress Psych:  Mood and affect appropriate for situation CV: Regular rate and rhythm on monitor Respiratory:  Regular, unlabored respirations on room air GI: Abdomen soft and nontender   NEURO:  Mental Status: AA&Ox3, patient is able to give clear and coherent history Speech/Language: speech is without dysarthria or aphasia.  Naming, repetition, fluency, and comprehension intact.  Cranial Nerves:  II: PERRL. Visual fields full.  III, IV, VI: EOMI. Eyelids elevate symmetrically.  V: Sensation is intact to light touch and symmetrical to face.  VII: Face is symmetrical resting and smiling VIII: hearing intact to voice. IX, X: Palate elevates symmetrically. Phonation is normal.  KP:Dynloizm shrug 5/5. XII: tongue is midline without fasciculations. Motor: 5/5 strength to all muscle groups tested.  Tone: is normal and bulk is normal Sensation- Intact to light touch bilaterally. Extinction absent to light touch to DSS.   Coordination: FTN intact bilaterally, HKS: no ataxia in BLE.No drift.  Gait- deferred  Most Recent NIH 0   ASSESSMENT/PLAN  [name] is a 68 y.o. male with history of  Htn, HLD, afibb on eliquis , prior stroke with residual mild left sided weakness, daily smoker, who presents with complete left eye monocular vision loss.   NIH on Admission 0  Ocular migraine with visual disturbance vs TIA due to A fib not taking Eliquis  as prescribed  Pt stated that his left yesterday seeing static lightening images on the left eye, no pain, gradually resolved overnight.  Code Stroke CT head No acute abnormality. ASPECTS 10.   Old  right frontal infarct  CTA head & neck no LVO, left M2 and  right P2 stenosis MRI no acute process.  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 2D Echo EF 55 to 60%  LDL 114 HgbA1c 5.5 ESR 2, CRP 2.3 UDS negative VTE prophylaxis -on Eliquis  Eliquis  prior to admission but taking only once a day, now on Eliquis  5 mg twice daily Therapy recommendations:  No follow up needed  Disposition: Home  Atrial fibrillation Home Meds: Eliquis  (taking only once a day) Continue telemetry monitoring Continue anticoagulation with Eliquis  twice daily  Hypertension Home meds:  micardis40mg   Stable Long-term BP goal normotensive  Hyperlipidemia Home meds: None LDL 114, goal < 70 Add atorvastatin  40 mg Continue statin at discharge  Tobacco Abuse Patient smokes cigarettes packs per day      Ready to quit? No Nicotine  replacement therapy provided  Alcohol abuse Ethanol level 209 CIWA protocol For acid level low, 5.6 Multivitamin, folate, thiamine  daily TOC consult for cessation placed advised to drink no more than 2 drink(s) a day  B12 deficiency B12 = 156 Macrocytic - MCV and MCH elevated On B12 supplement  Other Stroke Risk Factors Advanced age Silent stroke, MRI showed remote right MCA and right basal ganglia infarcts.   Hospital day # 0   Karna Geralds DNP, ACNPC-AG  [facility]  ATTENDING NOTE: I reviewed above note and agree with the assessment and plan. Pt was seen and examined.   No family at bedside.  Patient stated that currently his left eye vision near baseline.  However yesterday he had left eye seeing fixed lightening images, but no vision loss.  Symptoms resolved overnight.  Presentation not convincing for amaurosis fugax, more likely ocular migraine.  Not consistent with GCA.  Patient had A-fib but only take Eliquis  once a day, educated on Eliquis  twice daily.  High LDL, put on Lipitor.  CIWA protocol, supplement B12 and folic acid .  PT and OT no recommendation.  For detailed assessment and plan, please  refer to above as I have made changes wherever appropriate.  Neurology will sign off. Please call with questions. Pt will follow up with stroke clinic McCue NP at [facility] in about 4 weeks. Thanks for the consult.    Ary Cummins, MD PhD Stroke Neurology 01/14/2024 6:08 PM    To contact Stroke Continuity provider, please refer to [email or web address]. After hours, contact General Neurology

## 2024-01-14 NOTE — ED Provider Notes (Signed)
 [place] EMERGENCY DEPARTMENT AT [facility] Provider Note   CSN: [id number] Arrival date & time: 01/14/24  9750  An emergency department physician performed an initial assessment on this suspected stroke patient at 63.  Patient presents with: Loss of Vision   Patrick Padilla is a 68 y.o. male.   The history is provided by the patient.  Cerebrovascular Accident This is a new problem. The current episode started 1 to 2 hours ago. The problem occurs constantly. The problem has not changed since onset.Pertinent negatives include no chest pain, no abdominal pain, no headaches and no shortness of breath. Associated symptoms comments: Loss of vision in left eye suddenly . Nothing aggravates the symptoms. Nothing relieves the symptoms. He has tried nothing for the symptoms. The treatment provided no relief.  Patient with previous stroke with sudden onset LOV in the left eye watching TV    Past Medical History:  Diagnosis Date   Anxiety    Elevated LFTs    History of colon polyps    HLD (hyperlipidemia)    HTN (hypertension)    Localized superficial swelling, mass, or lump    Stroke [facility])      Prior to Admission medications   Medication Sig Start Date End Date Taking? Authorizing Provider  baclofen  (LIORESAL ) 10 MG tablet Take 1 tablet (10 mg total) by mouth 2 (two) times daily. 04/28/22   Babs Arthea DASEN, MD  ELIQUIS  5 MG TABS tablet Take 1 tablet by mouth twice daily 01/09/24   Tanda Bleacher, MD  folic acid  (FOLVITE ) 1 MG tablet Take 1 tablet (1 mg total) by mouth daily. Patient not taking: Reported on 10/08/2023 03/18/21   Inga Earnie GRADE, PA-C  Multiple Vitamin (MULTIVITAMIN WITH MINERALS) TABS tablet Take 1 tablet by mouth daily. 03/18/21   Inga Earnie GRADE, PA-C  PARoxetine  (PAXIL ) 30 MG tablet Take 1 tablet by mouth once daily 07/18/23   Tanda Bleacher, MD  Pediatric Multivitamins-Fl (MULTIVITAMIN + FLUORIDE) 0.25 MG CHEW Chew 1 tablet by mouth daily. Patient not taking:  Reported on 10/08/2023    [provider]  simvastatin (ZOCOR) 20 MG tablet Take 1 tablet by mouth daily.    [provider]  telmisartan  (MICARDIS ) 40 MG tablet Take 1 tablet by mouth once daily 10/11/23   Tanda Bleacher, MD  traZODone  (DESYREL ) 100 MG tablet Take 1 tablet (100 mg total) by mouth at bedtime. Patient not taking: Reported on 10/08/2023 03/21/23   Tanda Bleacher, MD  traZODone  (DESYREL ) 50 MG tablet Take 1 tablet (50 mg total) by mouth at bedtime. Patient not taking: Reported on 10/08/2023 08/22/22   Tanda Bleacher, MD    Allergies: Patient has no known allergies.    Review of Systems  Constitutional:  Negative for fever.  Eyes:  Positive for visual disturbance.  Respiratory:  Negative for shortness of breath.   Cardiovascular:  Negative for chest pain.  Gastrointestinal:  Negative for abdominal pain.  Neurological:  Negative for speech difficulty, weakness and headaches.  All other systems reviewed and are negative.   Updated Vital Signs BP 117/82   Pulse 84   Temp 98.2 F (36.8 C) (Oral)   Resp 19   Ht 6' (1.829 m)   Wt 90.7 kg   SpO2 95%   BMI 27.12 kg/m   Physical Exam Vitals and nursing note reviewed.  Constitutional:      General: He is not in acute distress.    Appearance: He is well-developed. He is not diaphoretic.  HENT:     Head: Normocephalic and atraumatic.     Nose: Nose normal.  Eyes:     Extraocular Movements: Extraocular movements intact.     Conjunctiva/sclera: Conjunctivae normal.  Cardiovascular:     Rate and Rhythm: Normal rate. Rhythm irregular.     Pulses: Normal pulses.     Heart sounds: Normal heart sounds.  Pulmonary:     Effort: Pulmonary effort is normal.     Breath sounds: Normal breath sounds. No wheezing or rales.  Abdominal:     General: Bowel sounds are normal.     Palpations: Abdomen is soft.     Tenderness: There is no abdominal tenderness. There is no guarding or rebound.  Musculoskeletal:         General: Normal range of motion.     Cervical back: Normal range of motion and neck supple.  Skin:    General: Skin is warm and dry.     Capillary Refill: Capillary refill takes less than 2 seconds.  Neurological:     Mental Status: He is alert and oriented to person, place, and time.     Deep Tendon Reflexes: Reflexes normal.     (all labs ordered are listed, but only abnormal results are displayed) Results for orders placed or performed during the hospital encounter of 01/14/24  CBG monitoring, ED   Collection Time: 01/14/24  2:51 AM  Result Value Ref Range   Glucose-Capillary 107 (H) 70 - 99 mg/dL  Protime-INR   Collection Time: 01/14/24  2:53 AM  Result Value Ref Range   Prothrombin Time 14.1 11.4 - 15.2 seconds   INR 1.0 0.8 - 1.2  APTT   Collection Time: 01/14/24  2:53 AM  Result Value Ref Range   aPTT 35 24 - 36 seconds  CBC   Collection Time: 01/14/24  2:53 AM  Result Value Ref Range   WBC 9.6 4.0 - 10.5 K/uL   RBC 5.06 4.22 - 5.81 MIL/uL   Hemoglobin 17.9 (H) 13.0 - 17.0 g/dL   HCT 47.6 (H) 60.9 - 47.9 %   MCV 103.4 (H) 80.0 - 100.0 fL   MCH 35.4 (H) 26.0 - 34.0 pg   MCHC 34.2 30.0 - 36.0 g/dL   RDW 87.3 88.4 - 84.4 %   Platelets 325 150 - 400 K/uL   nRBC 0.0 0.0 - 0.2 %  Differential   Collection Time: 01/14/24  2:53 AM  Result Value Ref Range   Neutrophils Relative % 57 %   Neutro Abs 5.5 1.7 - 7.7 K/uL   Lymphocytes Relative 35 %   Lymphs Abs 3.3 0.7 - 4.0 K/uL   Monocytes Relative 4 %   Monocytes Absolute 0.4 0.1 - 1.0 K/uL   Eosinophils Relative 3 %   Eosinophils Absolute 0.3 0.0 - 0.5 K/uL   Basophils Relative 1 %   Basophils Absolute 0.1 0.0 - 0.1 K/uL   Immature Granulocytes 0 %   Abs Immature Granulocytes 0.03 0.00 - 0.07 K/uL  Comprehensive metabolic panel   Collection Time: 01/14/24  2:53 AM  Result Value Ref Range   Sodium 141 135 - 145 mmol/L   Potassium 4.7 3.5 - 5.1 mmol/L   Chloride 106 98 - 111 mmol/L   CO2 22 22 - 32 mmol/L    Glucose, Bld 95 70 - 99 mg/dL   BUN 5 (L) 8 - 23 mg/dL   Creatinine, Ser 8.92 0.61 - 1.24 mg/dL   Calcium  9.1 8.9 - 10.3 mg/dL  Total Protein 6.4 (L) 6.5 - 8.1 g/dL   Albumin 3.3 (L) 3.5 - 5.0 g/dL   AST 15 15 - 41 U/L   ALT 17 0 - 44 U/L   Alkaline Phosphatase 147 (H) 38 - 126 U/L   Total Bilirubin 0.3 0.0 - 1.2 mg/dL   GFR, Estimated >39 >39 mL/min   Anion gap 13 5 - 15  Ethanol   Collection Time: 01/14/24  2:53 AM  Result Value Ref Range   Alcohol, Ethyl (B) 209 (H) <15 mg/dL  I-stat chem 8, ED   Collection Time: 01/14/24  2:58 AM  Result Value Ref Range   Sodium 140 135 - 145 mmol/L   Potassium 4.6 3.5 - 5.1 mmol/L   Chloride 106 98 - 111 mmol/L   BUN 6 (L) 8 - 23 mg/dL   Creatinine, Ser 8.79 0.61 - 1.24 mg/dL   Glucose, Bld 90 70 - 99 mg/dL   Calcium , Ion 1.02 (L) 1.15 - 1.40 mmol/L   TCO2 24 22 - 32 mmol/L   Hemoglobin 18.4 (H) 13.0 - 17.0 g/dL   HCT 45.9 (H) 60.9 - 47.9 %   CT ANGIO HEAD NECK W WO CM (CODE STROKE) Result Date: 01/14/2024 CLINICAL DATA:  Acute neurologic deficit EXAM: CT ANGIOGRAPHY HEAD AND NECK WITH AND WITHOUT CONTRAST TECHNIQUE: Multidetector CT imaging of the head and neck was performed using the standard protocol during bolus administration of intravenous contrast. Multiplanar CT image reconstructions and MIPs were obtained to evaluate the vascular anatomy. Carotid stenosis measurements (when applicable) are obtained utilizing NASCET criteria, using the distal internal carotid diameter as the denominator. RADIATION DOSE REDUCTION: This exam was performed according to the departmental dose-optimization program which includes automated exposure control, adjustment of the mA and/or kV according to patient size and/or use of iterative reconstruction technique. CONTRAST:  75mL OMNIPAQUE  IOHEXOL  350 MG/ML SOLN COMPARISON:  None Available. FINDINGS: CTA NECK FINDINGS Skeleton: No acute abnormality or high grade bony spinal canal stenosis. C6-7 ACDF Other neck:  Normal pharynx, larynx and major salivary glands. No cervical lymphadenopathy. Unremarkable thyroid gland. Upper chest: No pneumothorax or pleural effusion. No nodules or masses. Aortic arch: There is calcific atherosclerosis of the aortic arch. Conventional 3 vessel aortic branching pattern. RIGHT carotid system: No dissection, occlusion or aneurysm. Mild mixed density atherosclerosis at the carotid bifurcation and proximal ICA without hemodynamically significant stenosis. LEFT carotid system: No dissection, occlusion or aneurysm. Mild atherosclerotic calcification at the carotid bifurcation without hemodynamically significant stenosis. Vertebral arteries: Left dominant configuration. There is no dissection, occlusion or flow-limiting stenosis to the skull base (V1-V3 segments). CTA HEAD FINDINGS POSTERIOR CIRCULATION: Vertebral arteries are normal. No proximal occlusion of the anterior or inferior cerebellar arteries. Basilar artery is normal. Superior cerebellar arteries are normal. Posterior cerebral arteries are normal. ANTERIOR CIRCULATION: Atherosclerotic calcification of the internal carotid arteries at the skull base without hemodynamically significant stenosis. Anterior cerebral arteries are normal. Middle cerebral arteries are normal. Venous sinuses: As permitted by contrast timing, patent. Anatomic variants: None Review of the MIP images confirms the above findings. IMPRESSION: 1. No emergent large vessel occlusion or hemodynamically significant stenosis of the head or neck. 2. Mild bilateral carotid bifurcation atherosclerosis without hemodynamically significant stenosis. Aortic Atherosclerosis (ICD10-I70.0). Electronically Signed   By: Franky Stanford M.D.   On: 01/14/2024 03:17   CT HEAD CODE STROKE WO CONTRAST Result Date: 01/14/2024 CLINICAL DATA:  Code stroke.  Acute neurologic deficit EXAM: CT HEAD WITHOUT CONTRAST TECHNIQUE: Contiguous axial images were obtained  from the base of the skull through  the vertex without intravenous contrast. RADIATION DOSE REDUCTION: This exam was performed according to the departmental dose-optimization program which includes automated exposure control, adjustment of the mA and/or kV according to patient size and/or use of iterative reconstruction technique. COMPARISON:  03/12/2021 FINDINGS: Brain: There is no mass, hemorrhage or extra-axial collection. There is generalized atrophy without lobar predilection. Old right frontal infarct infarcts. Vascular: No abnormal hyperdensity of the major intracranial arteries or dural venous sinuses. No intracranial atherosclerosis. Skull: The visualized skull base, calvarium and extracranial soft tissues are normal. Sinuses/Orbits: No fluid levels or advanced mucosal thickening of the visualized paranasal sinuses. No mastoid or middle ear effusion. The orbits are normal. ASPECTS [facility] Stroke Program Early CT Score) - Ganglionic level infarction (caudate, lentiform nuclei, internal capsule, insula, M1-M3 cortex): 7 - Supraganglionic infarction (M4-M6 cortex): 3 Total score (0-10 with 10 being normal): 10 IMPRESSION: 1. No acute intracranial abnormality. 2. Old right frontal infarct. 3. ASPECTS is 10. These results were communicated to Dr. Salman Khaliqdina at 3:02 am on 01/14/2024 by text page via the [facility] messaging system. Electronically Signed   By: Franky Stanford M.D.   On: 01/14/2024 03:03     Radiology: CT ANGIO HEAD NECK W WO CM (CODE STROKE) Result Date: 01/14/2024 CLINICAL DATA:  Acute neurologic deficit EXAM: CT ANGIOGRAPHY HEAD AND NECK WITH AND WITHOUT CONTRAST TECHNIQUE: Multidetector CT imaging of the head and neck was performed using the standard protocol during bolus administration of intravenous contrast. Multiplanar CT image reconstructions and MIPs were obtained to evaluate the vascular anatomy. Carotid stenosis measurements (when applicable) are obtained utilizing NASCET criteria, using the distal internal carotid  diameter as the denominator. RADIATION DOSE REDUCTION: This exam was performed according to the departmental dose-optimization program which includes automated exposure control, adjustment of the mA and/or kV according to patient size and/or use of iterative reconstruction technique. CONTRAST:  75mL OMNIPAQUE  IOHEXOL  350 MG/ML SOLN COMPARISON:  None Available. FINDINGS: CTA NECK FINDINGS Skeleton: No acute abnormality or high grade bony spinal canal stenosis. C6-7 ACDF Other neck: Normal pharynx, larynx and major salivary glands. No cervical lymphadenopathy. Unremarkable thyroid gland. Upper chest: No pneumothorax or pleural effusion. No nodules or masses. Aortic arch: There is calcific atherosclerosis of the aortic arch. Conventional 3 vessel aortic branching pattern. RIGHT carotid system: No dissection, occlusion or aneurysm. Mild mixed density atherosclerosis at the carotid bifurcation and proximal ICA without hemodynamically significant stenosis. LEFT carotid system: No dissection, occlusion or aneurysm. Mild atherosclerotic calcification at the carotid bifurcation without hemodynamically significant stenosis. Vertebral arteries: Left dominant configuration. There is no dissection, occlusion or flow-limiting stenosis to the skull base (V1-V3 segments). CTA HEAD FINDINGS POSTERIOR CIRCULATION: Vertebral arteries are normal. No proximal occlusion of the anterior or inferior cerebellar arteries. Basilar artery is normal. Superior cerebellar arteries are normal. Posterior cerebral arteries are normal. ANTERIOR CIRCULATION: Atherosclerotic calcification of the internal carotid arteries at the skull base without hemodynamically significant stenosis. Anterior cerebral arteries are normal. Middle cerebral arteries are normal. Venous sinuses: As permitted by contrast timing, patent. Anatomic variants: None Review of the MIP images confirms the above findings. IMPRESSION: 1. No emergent large vessel occlusion or  hemodynamically significant stenosis of the head or neck. 2. Mild bilateral carotid bifurcation atherosclerosis without hemodynamically significant stenosis. Aortic Atherosclerosis (ICD10-I70.0). Electronically Signed   By: Franky Stanford M.D.   On: 01/14/2024 03:17   CT HEAD CODE STROKE WO CONTRAST Result Date: 01/14/2024 CLINICAL DATA:  Code stroke.  Acute neurologic deficit EXAM: CT HEAD WITHOUT CONTRAST TECHNIQUE: Contiguous axial images were obtained from the base of the skull through the vertex without intravenous contrast. RADIATION DOSE REDUCTION: This exam was performed according to the departmental dose-optimization program which includes automated exposure control, adjustment of the mA and/or kV according to patient size and/or use of iterative reconstruction technique. COMPARISON:  03/12/2021 FINDINGS: Brain: There is no mass, hemorrhage or extra-axial collection. There is generalized atrophy without lobar predilection. Old right frontal infarct infarcts. Vascular: No abnormal hyperdensity of the major intracranial arteries or dural venous sinuses. No intracranial atherosclerosis. Skull: The visualized skull base, calvarium and extracranial soft tissues are normal. Sinuses/Orbits: No fluid levels or advanced mucosal thickening of the visualized paranasal sinuses. No mastoid or middle ear effusion. The orbits are normal. ASPECTS [facility] Stroke Program Early CT Score) - Ganglionic level infarction (caudate, lentiform nuclei, internal capsule, insula, M1-M3 cortex): 7 - Supraganglionic infarction (M4-M6 cortex): 3 Total score (0-10 with 10 being normal): 10 IMPRESSION: 1. No acute intracranial abnormality. 2. Old right frontal infarct. 3. ASPECTS is 10. These results were communicated to Dr. Salman Khaliqdina at 3:02 am on 01/14/2024 by text page via the [facility] messaging system. Electronically Signed   By: Franky Stanford M.D.   On: 01/14/2024 03:03     .Critical Care  Performed by: Nettie Earing,  MD Authorized by: Nettie Earing, MD   Critical care provider statement:    Critical care time (minutes):  30   Critical care end time:  01/14/2024 3:57 AM   Critical care was necessary to treat or prevent imminent or life-threatening deterioration of the following conditions:  CNS failure or compromise   Critical care was time spent personally by me on the following activities:  Development of treatment plan with patient or surrogate, discussions with consultants, evaluation of patient's response to treatment, examination of patient, ordering and review of laboratory studies, ordering and review of radiographic studies, ordering and performing treatments and interventions, pulse oximetry, re-evaluation of patient's condition and review of old charts    Medications Ordered in the ED  sodium chloride  flush (NS) 0.9 % injection 3 mL (3 mLs Intravenous Given 01/14/24 0321)  iohexol  (OMNIPAQUE ) 350 MG/ML injection 75 mL (75 mLs Intravenous Contrast Given 01/14/24 0310)                                    Medical Decision Making Patient with sudden onset left eye loss of vision   Amount and/or Complexity of Data Reviewed Independent Historian: EMS    Details: See above  External Data Reviewed: notes.    Details: Previous notes reviewed  Labs: ordered.    Details: Normal coagulation studies.  Normal white count 9.6, elevated hemoglobin 17.9 normal sodium 140, normal potassium 4.6, normal creatinine 1.2  Radiology: ordered and independent interpretation performed.    Details: Negative CT for [facility] ECG/medicine tests: ordered and independent interpretation performed. Decision-making details documented in ED Course. Discussion of management or test interpretation with external provider(s): Dr. Vanessa admit to medicine   Risk Decision regarding hospitalization.     EKG Interpretation Date/Time:  Monday January 14 2024 03:17:54 EDT Ventricular Rate:  100 PR Interval:    QRS Duration:  85 QT  Interval:  419 QTC Calculation: 524 R Axis:   75  Text Interpretation: Atrial fibrillation Low voltage, extremity and precordial leads Minimal ST depression, anterolateral leads Prolonged QT interval Confirmed  by Renelda Kilian ([id number]) on 01/14/2024 3:57:32 AM        Final diagnoses:  Vision loss of left eye  CRAO (central retinal artery occlusion), left  Cerebrovascular accident (CVA), unspecified mechanism (HCC)   The patient appears reasonably stabilized for admission considering the current resources, flow, and capabilities available in the ED at this time, and I doubt any other [facility] requiring further screening and/or treatment in the ED prior to admission.  ED Discharge Orders     None          Concha Sudol, MD 01/14/24 0400

## 2024-01-14 NOTE — H&P (Signed)
 History and Physical    Patrick Padilla FMW:[id number] DOB: 02/04/1956 DOA: 01/14/2024  Patient coming from: Home.  Chief Complaint: Vision loss in left eye.    HPI: Patrick Padilla is a 69 y.o. male with history of stroke with left-sided weakness, atrial fibrillation on Eliquis  takes it only once a day, hypertension, alcohol abuse, tobacco abuse was brought to the ER after patient had a sudden vision loss on the left eye while watching television at around 1:10 AM today.  Denies any weakness of the upper or lower extremities denies any occultly swallowing or speaking.  ED Course: In the ER patient was evaluated by neurologist.  CT head not show anything acute.  CT angiogram head and neck did not show any large vessel obstruction.  Patient's symptoms concerning for CRAO.  Admitted for further workup.  Labs show a globin of 17.9 macrocytic picture.  EKG shows A-fib rate around 100 bpm.  Review of Systems: As per HPI, rest all negative.   Past Medical History:  Diagnosis Date   Anxiety    Elevated LFTs    History of colon polyps    HLD (hyperlipidemia)    HTN (hypertension)    Localized superficial swelling, mass, or lump    Stroke [facility])     Past Surgical History:  Procedure Laterality Date   CARDIOVERSION N/A 02/05/2019   Procedure: CARDIOVERSION;  Surgeon: Delford Maude BROCKS, MD;  Location: [facility] ENDOSCOPY;  Service: Cardiovascular;  Laterality: N/A;   CERVICAL DISC SURGERY     IR CT HEAD LTD  03/12/2021   IR PERCUTANEOUS ART THROMBECTOMY/INFUSION INTRACRANIAL INC DIAG ANGIO  03/12/2021   IR US  GUIDE VASC ACCESS RIGHT  03/15/2021   LEG SURGERY     right leg and ankle   NECK SURGERY     cyst removed   RADIOLOGY WITH ANESTHESIA N/A 03/12/2021   Procedure: RADIOLOGY WITH ANESTHESIA;  Surgeon: Radiologist, Medication, MD;  Location: [facility] OR;  Service: Radiology;  Laterality: N/A;   TEE WITHOUT CARDIOVERSION N/A 02/05/2019   Procedure: TRANSESOPHAGEAL ECHOCARDIOGRAM (TEE);  Surgeon: Delford Maude BROCKS, MD;   Location: [facility] ENDOSCOPY;  Service: Cardiovascular;  Laterality: N/A;   TOTAL HIP ARTHROPLASTY     left     reports that he has been smoking. He has never used smokeless tobacco. He reports current alcohol use. He reports that he does not use drugs.  No Known Allergies  Family History  Problem Relation Age of Onset   Hepatitis Mother    Clotting disorder Mother    Stroke Mother    Thyroid disease Mother    Colon cancer Father        ?   Rheumatic fever Brother    Heart disease Brother     Prior to Admission medications   Medication Sig Start Date End Date Taking? Authorizing Provider  ELIQUIS  5 MG TABS tablet Take 1 tablet by mouth twice daily 01/09/24  Yes Tanda Bleacher, MD  PARoxetine  (PAXIL ) 30 MG tablet Take 1 tablet by mouth once daily 07/18/23  Yes Tanda Bleacher, MD  telmisartan  (MICARDIS ) 40 MG tablet Take 1 tablet by mouth once daily 10/11/23  Yes Tanda Bleacher, MD  baclofen  (LIORESAL ) 10 MG tablet Take 1 tablet (10 mg total) by mouth 2 (two) times daily. 04/28/22   Babs Arthea DASEN, MD    Physical Exam: Constitutional: Moderately built and nourished. Vitals:   01/14/24 0330 01/14/24 0400 01/14/24 0500 01/14/24 0530  BP: 117/82 102/71 107/72 134/83  Pulse: 84 95 93 (!)  101  Resp: 19 18 17 19   Temp:      TempSrc:      SpO2: 95% 94% 96% 98%  Weight:      Height:       Eyes: Anicteric no pallor. ENMT: No discharge from the ears/nose or mouth. Neck: No mass felt.  No neck rigidity. Respiratory: No rhonchi or crepitations. Cardiovascular: S1-S2 heard. Abdomen: Soft nontender bowel sounds present. Musculoskeletal: No edema. Skin: No rash. Neurologic: Alert awake oriented to time place and person.  Moves all extremities 5 x 5.  No facial asymmetry tongue is midline pupils equal reactive light.   patient has mild blurred vision of the left eye. Psychiatric: Appears normal.  Normal affect.   Labs on Admission: I have personally reviewed following labs and imaging  studies  CBC: Recent Labs  Lab 01/14/24 0253 01/14/24 0258  WBC 9.6  --   NEUTROABS 5.5  --   HGB 17.9* 18.4*  HCT 52.3* 54.0*  MCV 103.4*  --   PLT 325  --    Basic Metabolic Panel: Recent Labs  Lab 01/14/24 0253 01/14/24 0258  NA 141 140  K 4.7 4.6  CL 106 106  CO2 22  --   GLUCOSE 95 90  BUN 5* 6*  CREATININE 1.07 1.20  CALCIUM  9.1  --    GFR: Estimated Creatinine Clearance: 65.6 mL/min (by C-G formula based on SCr of 1.2 mg/dL). Liver Function Tests: Recent Labs  Lab 01/14/24 0253  AST 15  ALT 17  ALKPHOS 147*  BILITOT 0.3  PROT 6.4*  ALBUMIN 3.3*   No results for input(s): LIPASE, AMYLASE in the last 168 hours. No results for input(s): AMMONIA in the last 168 hours. Coagulation Profile: Recent Labs  Lab 01/14/24 0253  INR 1.0   Cardiac Enzymes: No results for input(s): CKTOTAL, CKMB, CKMBINDEX, TROPONINI in the last 168 hours. BNP (last 3 results) No results for input(s): PROBNP in the last 8760 hours. HbA1C: No results for input(s): HGBA1C in the last 72 hours. CBG: Recent Labs  Lab 01/14/24 0251  GLUCAP 107*   Lipid Profile: No results for input(s): CHOL, HDL, LDLCALC, TRIG, CHOLHDL, LDLDIRECT in the last 72 hours. Thyroid Function Tests: No results for input(s): TSH, T4TOTAL, FREET4, T3FREE, THYROIDAB in the last 72 hours. Anemia Panel: No results for input(s): VITAMINB12, FOLATE, FERRITIN, TIBC, IRON, RETICCTPCT in the last 72 hours. Urine analysis:    Component Value Date/Time   COLORURINE YELLOW 03/20/2021 1323   APPEARANCEUR CLEAR 03/20/2021 1323   LABSPEC 1.025 03/20/2021 1323   PHURINE 6.0 03/20/2021 1323   GLUCOSEU NEGATIVE 03/20/2021 1323   HGBUR NEGATIVE 03/20/2021 1323   BILIRUBINUR SMALL (A) 03/20/2021 1323   KETONESUR NEGATIVE 03/20/2021 1323   PROTEINUR NEGATIVE 03/20/2021 1323   NITRITE NEGATIVE 03/20/2021 1323   LEUKOCYTESUR NEGATIVE 03/20/2021 1323   Sepsis  Labs: @LABRCNTIP (procalcitonin:4,lacticidven:4) )No results found for this or any previous visit (from the past 240 hours).   Radiological Exams on Admission: CT ANGIO HEAD NECK W WO CM (CODE STROKE) Result Date: 01/14/2024 CLINICAL DATA:  Acute neurologic deficit EXAM: CT ANGIOGRAPHY HEAD AND NECK WITH AND WITHOUT CONTRAST TECHNIQUE: Multidetector CT imaging of the head and neck was performed using the standard protocol during bolus administration of intravenous contrast. Multiplanar CT image reconstructions and MIPs were obtained to evaluate the vascular anatomy. Carotid stenosis measurements (when applicable) are obtained utilizing NASCET criteria, using the distal internal carotid diameter as the denominator. RADIATION DOSE REDUCTION: This exam was  performed according to the departmental dose-optimization program which includes automated exposure control, adjustment of the mA and/or kV according to patient size and/or use of iterative reconstruction technique. CONTRAST:  75mL OMNIPAQUE  IOHEXOL  350 MG/ML SOLN COMPARISON:  None Available. FINDINGS: CTA NECK FINDINGS Skeleton: No acute abnormality or high grade bony spinal canal stenosis. C6-7 ACDF Other neck: Normal pharynx, larynx and major salivary glands. No cervical lymphadenopathy. Unremarkable thyroid gland. Upper chest: No pneumothorax or pleural effusion. No nodules or masses. Aortic arch: There is calcific atherosclerosis of the aortic arch. Conventional 3 vessel aortic branching pattern. RIGHT carotid system: No dissection, occlusion or aneurysm. Mild mixed density atherosclerosis at the carotid bifurcation and proximal ICA without hemodynamically significant stenosis. LEFT carotid system: No dissection, occlusion or aneurysm. Mild atherosclerotic calcification at the carotid bifurcation without hemodynamically significant stenosis. Vertebral arteries: Left dominant configuration. There is no dissection, occlusion or flow-limiting stenosis to the  skull base (V1-V3 segments). CTA HEAD FINDINGS POSTERIOR CIRCULATION: Vertebral arteries are normal. No proximal occlusion of the anterior or inferior cerebellar arteries. Basilar artery is normal. Superior cerebellar arteries are normal. Posterior cerebral arteries are normal. ANTERIOR CIRCULATION: Atherosclerotic calcification of the internal carotid arteries at the skull base without hemodynamically significant stenosis. Anterior cerebral arteries are normal. Middle cerebral arteries are normal. Venous sinuses: As permitted by contrast timing, patent. Anatomic variants: None Review of the MIP images confirms the above findings. IMPRESSION: 1. No emergent large vessel occlusion or hemodynamically significant stenosis of the head or neck. 2. Mild bilateral carotid bifurcation atherosclerosis without hemodynamically significant stenosis. Aortic Atherosclerosis (ICD10-I70.0). Electronically Signed   By: Franky Stanford M.D.   On: 01/14/2024 03:17   CT HEAD CODE STROKE WO CONTRAST Result Date: 01/14/2024 CLINICAL DATA:  Code stroke.  Acute neurologic deficit EXAM: CT HEAD WITHOUT CONTRAST TECHNIQUE: Contiguous axial images were obtained from the base of the skull through the vertex without intravenous contrast. RADIATION DOSE REDUCTION: This exam was performed according to the departmental dose-optimization program which includes automated exposure control, adjustment of the mA and/or kV according to patient size and/or use of iterative reconstruction technique. COMPARISON:  03/12/2021 FINDINGS: Brain: There is no mass, hemorrhage or extra-axial collection. There is generalized atrophy without lobar predilection. Old right frontal infarct infarcts. Vascular: No abnormal hyperdensity of the major intracranial arteries or dural venous sinuses. No intracranial atherosclerosis. Skull: The visualized skull base, calvarium and extracranial soft tissues are normal. Sinuses/Orbits: No fluid levels or advanced mucosal  thickening of the visualized paranasal sinuses. No mastoid or middle ear effusion. The orbits are normal. ASPECTS [facility] Stroke Program Early CT Score) - Ganglionic level infarction (caudate, lentiform nuclei, internal capsule, insula, M1-M3 cortex): 7 - Supraganglionic infarction (M4-M6 cortex): 3 Total score (0-10 with 10 being normal): 10 IMPRESSION: 1. No acute intracranial abnormality. 2. Old right frontal infarct. 3. ASPECTS is 10. These results were communicated to Dr. Salman Khaliqdina at 3:02 am on 01/14/2024 by text page via the [facility] messaging system. Electronically Signed   By: Franky Stanford M.D.   On: 01/14/2024 03:03    EKG: Independently reviewed.  A-fib.  Assessment/Plan Principal Problem:   CRAO (central retinal artery occlusion)    CRAO -     appreciate neurology consult.  Patient's symptoms are concerning for CRAO of the left eye in the setting of patient not taking his Eliquis  as advised.  Patient was only taking it once a day due to financial issues.  Patient has been placed back on twice a day.  Neurochecks  swallow evaluation and MRI of the brain and check lipid panel hemoglobin A1c. Hypertension on ARB. A-fib on Eliquis .  See #1. Alcohol abuse on CIWA protocol but advised about quitting. Tobacco abuse advised about quitting. Macrocytosis could be from alcohol abuse.  Check B12 folate levels. Erythrocytosis likely from tobacco abuse.  Follow CBC.  Since patient has CRAO and will need further workup will need more than 2 midnight stay.   DVT prophylaxis: Eliquis . Code Status: Full code. Family Communication: Discussed with patient. Disposition Plan: Medical floor. Consults called: Neurology. Admission status: Observation.

## 2024-01-14 NOTE — Evaluation (Signed)
 Occupational Therapy Evaluation Patient Details Name: Patrick Padilla MRN: [id number] DOB: [birth year] Today's Date: 01/14/2024   History of Present Illness   Pt is a 68 y.o. male presenting 7/7 with report of L eye vision loss. CTH/CTA/MRI with no acute findings. Symptoms consistent with CRAO. PMH: HTN, HLD, afib on eliquis , CVA with mild residual L weakness, smoker.     Clinical Impressions PTA, pt lived with son and his family and reports being independent in ADL and IADL, drives. Upon eval, pt presents with L eye vision loss as only new complaint. Pt does have L residual weakness from a prior CVA. Pt mod I for BADL and IADL. Visual tracking WFL; some mild inconsistencies in report during visual field testing. Do not suspect pt will need OT follow up after discharge as pt compensating for any visual deficits well reporting vision is just fuzzy. Did strongly encourage pt to follow up with ophthalmologist after discharge.      If plan is discharge home, recommend the following:   Other (comment);Assist for transportation (on pt request)     Functional Status Assessment   Patient has had a recent decline in their functional status and demonstrates the ability to make significant improvements in function in a reasonable and predictable amount of time.     Equipment Recommendations   None recommended by OT     Recommendations for Other Services         Precautions/Restrictions   Precautions Precautions: Fall     Mobility Bed Mobility Overal bed mobility: Modified Independent                  Transfers Overall transfer level: Modified independent                        Balance Overall balance assessment: Mild deficits observed, not formally tested (balance deficits at baseline)                                         ADL either performed or assessed with clinical judgement   ADL Overall ADL's : Modified independent                                              Vision Baseline Vision/History: 1 Wears glasses Ability to See in Adequate Light: 0 Adequate Patient Visual Report: Blurring of vision (fuzzy vision in L eye only) Vision Assessment?: Yes;Vision impaired- to be further tested in functional context Eye Alignment: Within Functional Limits Ocular Range of Motion: Within Functional Limits Alignment/Gaze Preference: Within Defined Limits Tracking/Visual Pursuits: Able to track stimulus in all quads without difficulty Convergence: Within functional limits Visual Fields: No apparent deficits Additional Comments: pt reports fuzzy vision esp with only L eye open, but occulomotor testing and visual fields appear intact. Pt able to read various fonts with his glasses donned     Perception         Praxis         Pertinent Vitals/Pain Pain Assessment Pain Assessment: No/denies pain     Extremity/Trunk Assessment Upper Extremity Assessment Upper Extremity Assessment: Right hand dominant;Overall [facility] for tasks assessed   Lower Extremity Assessment Lower Extremity Assessment: Overall WFL for tasks assessed       Communication Communication  Communication: No apparent difficulties;Impaired Factors Affecting Communication: Hearing impaired   Cognition Arousal: Alert Behavior During Therapy: WFL for tasks assessed/performed Cognition: No apparent impairments             OT - Cognition Comments: difficult to assess due to pt is hearing impaired, but follows up to two step commands, can do basic multitasking.                 Following commands: Intact       Cueing  General Comments   Cueing Techniques: Verbal cues  HR up to 137 during mobility   Exercises     Shoulder Instructions      Home Living Family/patient expects to be discharged to:: Private residence Living Arrangements: Children (son, daughter in Social worker, grandchild) Available Help at Discharge: Family  (daughter in law always home but they do not socialize much) Type of Home: House Home Access: Stairs to enter [employer] of Steps: ~3 STE front, 4 on back Entrance Stairs-Rails: None Home Layout: One level     Bathroom Shower/Tub: Chief Strategy Officer: Standard     Home Equipment: Agricultural consultant (2 wheels)          Prior Functioning/Environment Prior Level of Function : Independent/Modified Independent;Driving             Mobility Comments: no AD; ~2-3 falls in the past 6 months ADLs Comments: independent in ADL, IADL, drives    OT Problem List: Decreased activity tolerance;Impaired balance (sitting and/or standing);Impaired vision/perception   OT Treatment/Interventions:        OT Goals(Current goals can be found in the care plan section)   Acute Rehab OT Goals Patient Stated Goal: get better OT Goal Formulation: With patient Time For Goal Achievement: 01/28/24 Potential to Achieve Goals: Good   OT Frequency:       Co-evaluation              AM-PAC OT 6 Clicks Daily Activity     Outcome Measure Help from another person eating meals?: None Help from another person taking care of personal grooming?: None Help from another person toileting, which includes using toliet, bedpan, or urinal?: None Help from another person bathing (including washing, rinsing, drying)?: None Help from another person to put on and taking off regular upper body clothing?: None Help from another person to put on and taking off regular lower body clothing?: None 6 Click Score: 24   End of Session Equipment Utilized During Treatment: Gait belt Nurse Communication: Mobility status  Activity Tolerance: Patient tolerated treatment well Patient left: in bed;with call bell/phone within reach  OT Visit Diagnosis: Unsteadiness on feet (R26.81);Low vision, both eyes (H54.2)                Time: 8878-8848 OT Time Calculation (min): 30 min Charges:  OT  General Charges $OT Visit: 1 Visit OT Evaluation $OT Eval Low Complexity: 1 Low OT Treatments $Self Care/Home Management : 8-22 mins  Elma JONETTA [name] [name], OTR/L [facility] Acute Rehabilitation Office: [phone]   Elma JONETTA [name] 01/14/2024, 12:56 PM

## 2024-01-14 NOTE — Code Documentation (Addendum)
 Stroke Response Nurse Documentation Code Documentation  Patrick Padilla is a 68 y.o. male arriving to [facility]  via [facility] on 01/14/2024 with past medical hx of  HTN, HLD, afib on eliquis , prior stroke with residual mild left sided weakness, daily smoker. Code stroke was activated by EMS.   Patient from home where he was LKW at 0130 and now complaining of left eye blindness .   Stroke team at the bedside on patient arrival. Labs drawn and patient cleared for CT by EDP. Patient to CT with team. NIHSS 0, see documentation for details and code stroke times. he following imaging was completed:  CT Head and CTA. Patient is not a candidate for IV Thrombolytic due to taking eliquis  with in 48 hours. Patient is not a candidate for IR due to LVO negative.   Care Plan: Q2 hour NIH,neuro checks, and vital signs. maintain SBP <180    Bedside handoff with ED RN Caron.    [name]  Stroke Response RN

## 2024-01-14 NOTE — Discharge Summary (Signed)
 Physician Discharge Summary  [name] FMW:[id number] DOB: Oct 28, 1955 DOA: 01/14/2024  PCP: Tanda Bleacher, MD  Admit date: 01/14/2024 Discharge date: 01/14/2024  Admitted From: Home Disposition: Home  Recommendations for Outpatient Follow-up:  Follow up with PCP in 1-2 weeks Follow-up with [facility] 3 weeks Refilled all of his home medications, instructed to take his Eliquis  twice daily (reportedly on taking once daily prior to hospitalization) Continue to encourage tobacco/alcohol cessation  Home Health: No needs identified by therapy Equipment/Devices: None  Discharge Condition: Stable CODE STATUS: Full code Diet recommendation: Heart healthy diet  History of present illness:  Patrick Padilla is a 68 year old male with history of CVA with left-sided weakness, paroxysmal atrial fibrillation on Eliquis  (now taking correctly, only once daily), anxiety/depression, HTN, alcohol/tobacco use disorder who presented to [facility] ED on 01/14/2024 via EMS with visual disturbance to left eye.  Patient reports sudden loss of vision left eye while watching television around 1:10 AM today.  Described as looking like lightning.  Denies any other focal neurological symptoms including weakness of his upper/lower extremities, no difficulty speaking or swallowing.  In the ED, code stroke was initiated.  WBC 9.6, hemoglobin 17.9, platelet count 325, MCV 103.4.  Sodium 141, potassium 4.7, chloride 106, CO2 22, glucose 95, BUN 5, creatinine 1.07.  AST 15, ALT 17, total bilirubin 0.3.  INR 1.0.  EtOH level 209.  CT head code stroke with no acute intracranial normality, old right frontal infarct noted.  CT angiogram head/neck with no emergent large vessel occlusion or hemodynamically significant stenosis of the head/neck, mild bilateral carotid bifurcation atherosclerosis without hemodynamically significant stenosis.  Neurology was consulted as above.  TRH consulted for admission for further  evaluation management of concern of tensional concern of central retinal artery occlusion.  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  Patient with history of CVA, continues with significant alcohol and tobacco use disorder.  Also with medication noncompliance only utilizing Eliquis  once daily.  CT head without contrast with no acute findings.  CT angiogram head/neck with no large vessel occlusion, or hemodynamically significant stenosis of the head/neck, mild bilateral carotid bifurcation atherosclerosis without hemodynamically significant stenosis.  MRI brain without contrast with no acute infarct or hemorrhage.  Patient's vision has spontaneously returned and back to normal limits.  Hemoglobin A1c 5.6, LDL 114.  TTE with LVEF 55-60%, no LV regional wall motion abnormalities, noted bilateral atrial enlargement, mild MR, no aortic stenosis, IVC normal.  Seen by neurology, believe his ocular symptoms likely related to ocular migraine.  Encouraged tobacco/alcohol cessation.  Encouraged correct use of Eliquis  5 mg p.o. twice daily.  Outpatient follow-up with [facility] 3 weeks.  Paroxysmal atrial fibrillation on anticoagulation Patient instructed to continue Eliquis  5 mg p.o. twice daily (was only taking once daily at home)  Anxiety/depression Continue Paxil  30 mg p.o. daily  B12 deficiency Started on vitamin B12 1000 mcg p.o. daily  Folate deficiency Started on folic acid  1 mg p.o. daily  HLD LDL 114, started on atorvastatin  40 mg p.o. daily  HTN Continue telmisartan  40 mg p.o. daily  Alcohol use disorder/intoxication Patient's EtOH level elevated at 209 on admission.  Tobacco use disorder Counsel need for complete cessation/abstinence given risk factor for CVA.  Discharge Diagnoses:  Principal Problem:    CRAO (central retinal artery occlusion) Active Problems:   Essential hypertension  Alcohol abuse   Benign essential HTN   PAF (paroxysmal atrial fibrillation) (HCC)   Macrocytosis   Erythrocytosis    Discharge Instructions  Discharge Instructions     Call MD for:  difficulty breathing, headache or visual disturbances   Complete by: As directed    Call MD for:  extreme fatigue   Complete by: As directed    Call MD for:  persistant dizziness or light-headedness   Complete by: As directed    Call MD for:  persistant nausea and vomiting   Complete by: As directed    Call MD for:  severe uncontrolled pain   Complete by: As directed    Call MD for:  temperature >100.4   Complete by: As directed    Diet - low sodium heart healthy   Complete by: As directed    Increase activity slowly   Complete by: As directed       Allergies as of 01/14/2024   No Known Allergies      Medication List     STOP taking these medications    UNKNOWN TO PATIENT       TAKE these medications    apixaban  5 MG Tabs tablet Commonly known as: Eliquis  Take 1 tablet (5 mg total) by mouth 2 (two) times daily.   atorvastatin  40 MG tablet Commonly known as: Lipitor Take 1 tablet (40 mg total) by mouth daily.   cyanocobalamin  1000 MCG tablet Take 1 tablet (1,000 mcg total) by mouth daily. Start taking on: January 15, 2024   folic acid  1 MG tablet Commonly known as: FOLVITE  Take 1 tablet (1 mg total) by mouth daily. Start taking on: January 15, 2024   methocarbamol  500 MG tablet Commonly known as: ROBAXIN  Take 1 tablet (500 mg total) by mouth at bedtime as needed for muscle spasms.   naproxen sodium 220 MG tablet Commonly known as: ALEVE Take 220 mg by mouth daily as needed (pain).   PARoxetine  30 MG tablet Commonly known as: PAXIL  Take 1 tablet (30 mg total) by mouth daily.   telmisartan  40 MG tablet Commonly known as: MICARDIS  Take 1 tablet (40 mg total) by mouth daily.         Follow-up Information     Tanda Bleacher, MD .   Specialty: Family Medicine Contact information: [address] [place] [address] [phone]         [facility]. Schedule an appointment as soon as possible for a visit in 3 week(s).   Specialty: Neurology Contact information: [address] [place]  [address] [phone]               No Known Allergies  Consultations: Neurology   Procedures/Studies: ECHOCARDIOGRAM COMPLETE Result Date: 01/14/2024    ECHOCARDIOGRAM REPORT   Patient Name:   Dusan Lipford Date of Exam: 01/14/2024 Medical Rec #:  [id number]   Height:       72.0 in Accession #:    [id number]  Weight:       200.0 lb Date of Birth:  [birth year]   BSA:          2.131 m Patient Age:    67 years    BP:           162/105 mmHg Patient Gender: M           HR:           103 bpm. Exam Location:  Inpatient Procedure: 2D  Echo, Color Doppler and Cardiac Doppler (Both Spectral and Color            Flow Doppler were utilized during procedure). Indications:    Stroke  History:        Patient has prior history of Echocardiogram examinations, most                 recent 04/03/2021. Risk Factors:Current Smoker and Hypertension.  Sonographer:    Benard Stallion Referring Phys: ARY CUMMINS IMPRESSIONS  1. Left ventricular ejection fraction, by estimation, is 55 to 60%. The left ventricle has normal function. The left ventricle has no regional wall motion abnormalities. Left ventricular diastolic parameters were normal.  2. Right ventricular systolic function is normal. The right ventricular size is normal.  3. Left atrial size was mildly dilated.  4. Right atrial size was mildly dilated.  5. The mitral valve is abnormal. Trivial mitral valve regurgitation. No evidence of mitral stenosis.  6. The aortic valve is tricuspid. There is mild calcification of the aortic valve. Aortic valve regurgitation is not visualized. Aortic  valve sclerosis is present, with no evidence of aortic valve stenosis.  7. The inferior vena cava is normal in size with greater than 50% respiratory variability, suggesting right atrial pressure of 3 mmHg. FINDINGS  Left Ventricle: Left ventricular ejection fraction, by estimation, is 55 to 60%. The left ventricle has normal function. The left ventricle has no regional wall motion abnormalities. Strain was performed and the global longitudinal strain is indeterminate. The left ventricular internal cavity size was normal in size. There is no left ventricular hypertrophy. Left ventricular diastolic parameters were normal. Right Ventricle: The right ventricular size is normal. No increase in right ventricular wall thickness. Right ventricular systolic function is normal. Left Atrium: Left atrial size was mildly dilated. Right Atrium: Right atrial size was mildly dilated. Pericardium: There is no evidence of pericardial effusion. Mitral Valve: The mitral valve is abnormal. There is mild thickening of the mitral valve leaflet(s). There is mild calcification of the mitral valve leaflet(s). Trivial mitral valve regurgitation. No evidence of mitral valve stenosis. Tricuspid Valve: The tricuspid valve is normal in structure. Tricuspid valve regurgitation is trivial. No evidence of tricuspid stenosis. Aortic Valve: The aortic valve is tricuspid. There is mild calcification of the aortic valve. Aortic valve regurgitation is not visualized. Aortic valve sclerosis is present, with no evidence of aortic valve stenosis. Aortic valve mean gradient measures 1.5 mmHg. Aortic valve peak gradient measures 2.1 mmHg. Aortic valve area, by VTI measures 2.57 cm. Pulmonic Valve: The pulmonic valve was normal in structure. Pulmonic valve regurgitation is not visualized. No evidence of pulmonic stenosis. Aorta: The aortic root is normal in size and structure. Venous: The inferior vena cava is normal in size with greater than 50% respiratory  variability, suggesting right atrial pressure of 3 mmHg. IAS/Shunts: No atrial level shunt detected by color flow Doppler. Additional Comments: 3D was performed not requiring image post processing on an independent workstation and was indeterminate.  LEFT VENTRICLE PLAX 2D LVIDd:         4.30 cm   Diastology LVIDs:         3.10 cm   LV e' medial:    3.92 cm/s LV PW:         1.10 cm   LV E/e' medial:  19.6 LV IVS:        1.10 cm   LV e' lateral:   7.18 cm/s LVOT diam:  2.00 cm   LV E/e' lateral: 10.7 LV SV:         29 LV SV Index:   14 LVOT Area:     3.14 cm  RIGHT VENTRICLE RV S prime:     9.79 cm/s TAPSE (M-mode): 1.3 cm LEFT ATRIUM           Index LA diam:      4.20 cm 1.97 cm/m LA Vol (A4C): 42.1 ml 19.76 ml/m  AORTIC VALVE AV Area (Vmax):    2.10 cm AV Area (Vmean):   1.98 cm AV Area (VTI):     2.57 cm AV Vmax:           72.70 cm/s AV Vmean:          51.450 cm/s AV VTI:            0.113 m AV Peak Grad:      2.1 mmHg AV Mean Grad:      1.5 mmHg LVOT Vmax:         48.70 cm/s LVOT Vmean:        32.500 cm/s LVOT VTI:          0.092 m LVOT/AV VTI ratio: 0.82  AORTA Ao Root diam: 3.40 cm Ao Asc diam:  3.10 cm MITRAL VALVE MV Area (PHT): 4.80 cm    SHUNTS MV Decel Time: 158 msec    Systemic VTI:  0.09 m MV E velocity: 77.00 cm/s  Systemic Diam: 2.00 cm MV A velocity: 31.40 cm/s MV E/A ratio:  2.45 Maude Emmer MD Electronically signed by Maude Emmer MD Signature Date/Time: 01/14/2024/1:18:04 PM    Final    MR BRAIN WO CONTRAST Result Date: 01/14/2024 EXAM: MRI BRAIN WITHOUT CONTRAST 01/14/2024 07:37:26 AM TECHNIQUE: Multiplanar multisequence MRI of the head/brain was performed without the administration of intravenous contrast. COMPARISON: CT head without contrast and CT angio head and neck 01/14/2024. MR head without contrast 03/13/2021. CLINICAL HISTORY: Neuro deficit, acute, stroke suspected. Chief Complaint: Vision loss in left eye. FINDINGS: BRAIN AND VENTRICLES: No acute infarct. A remote right MCA  infarct and remote right basal ganglia infarct demonstrate expected evolution from the prior MRI. No blood products are present. A focal area of susceptibility is present in the right parietal lobe posterior to the atrium of the right lateral ventricle. No acute hemorrhage is present. No mass. No midline shift. No hydrocephalus. The sella is unremarkable. Normal flow voids. ORBITS: No acute abnormality. SINUSES AND MASTOIDS: No acute abnormality. BONES AND SOFT TISSUES: Normal marrow signal. A 2 cm sebaceous cyst in the posterior left neck is stable to slightly increased in size. No associated inflammatory changes are present. IMPRESSION: 1. No acute infarct or hemorrhage. 2. 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 Electronically signed by: Lonni Necessary MD 01/14/2024 07:45 AM EDT RP Workstation: [id number]   CT ANGIO HEAD NECK W WO CM (CODE STROKE) Result Date: 01/14/2024 CLINICAL DATA:  Acute neurologic deficit EXAM: CT ANGIOGRAPHY HEAD AND NECK WITH AND WITHOUT CONTRAST TECHNIQUE: Multidetector CT imaging of the head and neck was performed using the standard protocol during bolus administration of intravenous contrast. Multiplanar CT image reconstructions and MIPs were obtained to evaluate the vascular anatomy. Carotid stenosis measurements (when applicable) are obtained utilizing NASCET criteria, using the distal internal carotid diameter as the denominator. RADIATION DOSE REDUCTION: This exam was performed according to the departmental dose-optimization program which includes automated exposure control, adjustment of the mA and/or kV  according to patient size and/or use of iterative reconstruction technique. CONTRAST:  75mL OMNIPAQUE  IOHEXOL  350 MG/ML SOLN COMPARISON:  None Available. FINDINGS: CTA NECK FINDINGS Skeleton: No acute abnormality or high grade bony spinal canal stenosis. C6-7 ACDF Other neck: Normal pharynx, larynx and major salivary glands.  No cervical lymphadenopathy. Unremarkable thyroid gland. Upper chest: No pneumothorax or pleural effusion. No nodules or masses. Aortic arch: There is calcific atherosclerosis of the aortic arch. Conventional 3 vessel aortic branching pattern. RIGHT carotid system: No dissection, occlusion or aneurysm. Mild mixed density atherosclerosis at the carotid bifurcation and proximal ICA without hemodynamically significant stenosis. LEFT carotid system: No dissection, occlusion or aneurysm. Mild atherosclerotic calcification at the carotid bifurcation without hemodynamically significant stenosis. Vertebral arteries: Left dominant configuration. There is no dissection, occlusion or flow-limiting stenosis to the skull base (V1-V3 segments). CTA HEAD FINDINGS POSTERIOR CIRCULATION: Vertebral arteries are normal. No proximal occlusion of the anterior or inferior cerebellar arteries. Basilar artery is normal. Superior cerebellar arteries are normal. Posterior cerebral arteries are normal. ANTERIOR CIRCULATION: Atherosclerotic calcification of the internal carotid arteries at the skull base without hemodynamically significant stenosis. Anterior cerebral arteries are normal. Middle cerebral arteries are normal. Venous sinuses: As permitted by contrast timing, patent. Anatomic variants: None Review of the MIP images confirms the above findings. IMPRESSION: 1. No emergent large vessel occlusion or hemodynamically significant stenosis of the head or neck. 2. Mild bilateral carotid bifurcation atherosclerosis without hemodynamically significant stenosis. Aortic Atherosclerosis (ICD10-I70.0). Electronically Signed   By: Franky Stanford M.D.   On: 01/14/2024 03:17   CT HEAD CODE STROKE WO CONTRAST Result Date: 01/14/2024 CLINICAL DATA:  Code stroke.  Acute neurologic deficit EXAM: CT HEAD WITHOUT CONTRAST TECHNIQUE: Contiguous axial images were obtained from the base of the skull through the vertex without intravenous contrast.  RADIATION DOSE REDUCTION: This exam was performed according to the departmental dose-optimization program which includes automated exposure control, adjustment of the mA and/or kV according to patient size and/or use of iterative reconstruction technique. COMPARISON:  03/12/2021 FINDINGS: Brain: There is no mass, hemorrhage or extra-axial collection. There is generalized atrophy without lobar predilection. Old right frontal infarct infarcts. Vascular: No abnormal hyperdensity of the major intracranial arteries or dural venous sinuses. No intracranial atherosclerosis. Skull: The visualized skull base, calvarium and extracranial soft tissues are normal. Sinuses/Orbits: No fluid levels or advanced mucosal thickening of the visualized paranasal sinuses. No mastoid or middle ear effusion. The orbits are normal. ASPECTS [facility] Stroke Program Early CT Score) - Ganglionic level infarction (caudate, lentiform nuclei, internal capsule, insula, M1-M3 cortex): 7 - Supraganglionic infarction (M4-M6 cortex): 3 Total score (0-10 with 10 being normal): 10 IMPRESSION: 1. No acute intracranial abnormality. 2. Old right frontal infarct. 3. ASPECTS is 10. These results were communicated to Dr. Salman Khaliqdina at 3:02 am on 01/14/2024 by text page via the [facility] messaging system. Electronically Signed   By: Franky Stanford M.D.   On: 01/14/2024 03:03     Subjective: Patient seen examined bedside, lying in bed.  No specific complaints this afternoon other than requesting refills of his medications.  Discussed need for complete alcohol/tobacco cessation given risk factors for stroke given his history.  Also encouraged medical compliance with his medications.  Vision remains at baseline, no other Spenser questions, concerns or complaints at this time.  Discussed with patient that neurology likely relates his visual disturbance to ocular migraine.  No other specific questions, concerns or complaints at this time.  Attempted to update  patient's son via  telephone, went straight to voicemail with mailbox being full.  Patient denies headache, no visual changes currently, no dizziness, no chest pain, no palpitations, no shortness of breath, no abdominal pain, no fever/chills/night sweats, no nausea/vomiting/diarrhea, no focal weakness, no fatigue, no paresthesias.  No acute events overnight per nursing.  Discharge Exam: Vitals:   01/14/24 1345 01/14/24 1426  BP: (!) 152/94   Pulse: 100   Resp: 17   Temp:  (!) 96.7 F (35.9 C)  SpO2: 96%    Vitals:   01/14/24 1015 01/14/24 1045 01/14/24 1345 01/14/24 1426  BP: (!) 162/105  (!) 152/94   Pulse: 91 94 100   Resp: 17 16 17    Temp:    (!) 96.7 F (35.9 C)  TempSrc:    Temporal  SpO2: 97% 97% 96%   Weight:      Height:        Physical Exam: GEN: NAD, alert and oriented x 3, chronically ill appearance, appears older than stated age HEENT: NCAT, PERRL, EOMI, sclera clear, MMM PULM: CTAB w/o wheezes/crackles, normal respiratory effort, on room air CV: RRR w/o M/G/R GI: abd soft, NTND, NABS, no R/G/M MSK: no peripheral edema, muscle strength globally intact 5/5 bilateral upper/lower extremities NEURO: CN II-XII intact, no focal deficits, sensation to light touch intact PSYCH: normal mood/affect Integumentary: dry/intact, no rashes or wounds    The results of significant diagnostics from this hospitalization (including imaging, microbiology, ancillary and laboratory) are listed below for reference.     Microbiology: No results found for this or any previous visit (from the past 240 hours).   Labs: BNP (last 3 results) No results for input(s): BNP in the last 8760 hours. Basic Metabolic Panel: Recent Labs  Lab 01/14/24 0253 01/14/24 0258 01/14/24 0618  NA 141 140 140  K 4.7 4.6 4.3  CL 106 106 104  CO2 22  --  22  GLUCOSE 95 90 85  BUN 5* 6* <5*  CREATININE 1.07 1.20 1.04  CALCIUM  9.1  --  8.8*   Liver Function Tests: Recent Labs  Lab  01/14/24 0253 01/14/24 0618  AST 15 13*  ALT 17 16  ALKPHOS 147* 136*  BILITOT 0.3 0.3  PROT 6.4* 5.9*  ALBUMIN 3.3* 3.0*   No results for input(s): LIPASE, AMYLASE in the last 168 hours. No results for input(s): AMMONIA in the last 168 hours. CBC: Recent Labs  Lab 01/14/24 0253 01/14/24 0258 01/14/24 0618  WBC 9.6  --  8.7  NEUTROABS 5.5  --   --   HGB 17.9* 18.4* 16.9  HCT 52.3* 54.0* 49.0  MCV 103.4*  --  102.5*  PLT 325  --  322   Cardiac Enzymes: No results for input(s): CKTOTAL, CKMB, CKMBINDEX, TROPONINI in the last 168 hours. BNP: Invalid input(s): POCBNP CBG: Recent Labs  Lab 01/14/24 0251  GLUCAP 107*   D-Dimer No results for input(s): DDIMER in the last 72 hours. Hgb A1c Recent Labs    01/14/24 0618  HGBA1C 5.5   Lipid Profile Recent Labs    01/14/24 0618  CHOL 182  HDL 40*  LDLCALC 114*  TRIG 139  CHOLHDL 4.6   Thyroid function studies No results for input(s): TSH, T4TOTAL, T3FREE, THYROIDAB in the last 72 hours.  Invalid input(s): FREET3 Anemia work up Recent Labs    01/14/24 0618  VITAMINB12 165*  FOLATE 5.6*   Urinalysis    Component Value Date/Time   COLORURINE YELLOW 03/20/2021 1323   APPEARANCEUR CLEAR 03/20/2021  1323   LABSPEC 1.025 03/20/2021 1323   PHURINE 6.0 03/20/2021 1323   GLUCOSEU NEGATIVE 03/20/2021 1323   HGBUR NEGATIVE 03/20/2021 1323   BILIRUBINUR SMALL (A) 03/20/2021 1323   KETONESUR NEGATIVE 03/20/2021 1323   PROTEINUR NEGATIVE 03/20/2021 1323   NITRITE NEGATIVE 03/20/2021 1323   LEUKOCYTESUR NEGATIVE 03/20/2021 1323   Sepsis Labs Recent Labs  Lab 01/14/24 0253 01/14/24 0618  WBC 9.6 8.7   Microbiology No results found for this or any previous visit (from the past 240 hours).   Time coordinating discharge: Over 30 minutes  SIGNED:   Camellia PARAS [place], DO  [facility] 01/14/2024, 2:44 PM

## 2024-01-14 NOTE — ED Notes (Signed)
Patient eating breakfast. °

## 2024-01-14 NOTE — Evaluation (Signed)
 Physical Therapy Brief Evaluation and Discharge Note Patient Details Name: Patrick Padilla MRN: [id number] DOB: 05/16/1956 Today's Date: 01/14/2024   History of Present Illness  Pt is a 68 y.o. male presenting 7/7 with report of L eye vision loss. CTH/CTA/MRI with no acute findings. Symptoms consistent with CRAO. PMH: HTN, HLD, afib on eliquis , CVA with mild residual L weakness, smoker.  Clinical Impression  Pt at baseline with mobility and ready for dc from PT standpoint. Pt reports that he catches his lt foot on uneven surfaces at baseline. During ambulation pt with decr dorsiflexion on lt (old CVA). To manual muscle testing his dorsiflexion on lt is 5/5 but he isn't activating it fully with gait. Instructed in stepping drills at a counter top.        PT Assessment Patient does not need any further PT services  Assistance Needed at Discharge  PRN    Equipment Recommendations None recommended by PT  Recommendations for Other Services       Precautions/Restrictions Precautions Precautions: Fall Recall of Precautions/Restrictions: Intact Restrictions Weight Bearing Restrictions Per Provider Order: No        Mobility  Bed Mobility   Supine/Sidelying to sit: Independent Sit to supine/sidelying: Independent    Transfers Overall transfer level: Modified independent                      Ambulation/Gait Ambulation/Gait assistance: Modified independent (Device/Increase time) Gait Distance (Feet): 175 Feet Assistive device: None Gait Pattern/deviations: Decreased dorsiflexion - left, Decreased stride length Gait Speed: Below normal General Gait Details: Pt with decr foot clearance on lt and slight circumduction.  Home Activity Instructions    Stairs            Modified Rankin (Stroke Patients Only)        Balance Overall balance assessment: History of Falls   Sitting balance-Leahy Scale: Normal       Standing balance-Leahy Scale: Good             Pertinent Vitals/Pain   Pain Assessment Pain Assessment: No/denies pain     Home Living Family/patient expects to be discharged to:: Private residence Living Arrangements: Children;Other relatives (son, daughter in law, grandchild) Available Help at Discharge: Family;Available 24 hours/day Home Environment: Stairs to enter  [employer] of Steps: 3-4 Home Equipment: Agricultural consultant (2 wheels)        Prior Function Level of Independence: Independent      UE/LE Assessment   UE ROM/Strength/Tone/Coordination:  (defer to OT)    LE ROM/Strength/Tone/Coordination: Impaired LE ROM/Strength/Tone/Coordination Deficits: Functionally decr lt dorsiflexion but with manual muscle testing it is 5/5    Communication   Communication Communication: Impaired Factors Affecting Communication: Hearing impaired     Cognition Overall Cognitive Status: Appears within functional limits for tasks assessed/performed       General Comments General comments (skin integrity, edema, etc.): HR up to 137 during mobility    Exercises     Assessment/Plan    PT Problem List         PT Visit Diagnosis Other abnormalities of gait and mobility (R26.89)    No Skilled PT Patient at baseline level of functioning;Patient is modified independent with all activity/mobility   Co-evaluation                AMPAC 6 Clicks Help needed turning from your back to your side while in a flat bed without using bedrails?: None Help needed moving from lying on your back to sitting  on the side of a flat bed without using bedrails?: None Help needed moving to and from a bed to a chair (including a wheelchair)?: None Help needed standing up from a chair using your arms (e.g., wheelchair or bedside chair)?: None Help needed to walk in hospital room?: None Help needed climbing 3-5 steps with a railing? : A Little 6 Click Score: 23      End of Session   Activity Tolerance: Patient tolerated treatment  well Patient left: in bed;with call bell/phone within reach Nurse Communication: Mobility status PT Visit Diagnosis: Other abnormalities of gait and mobility (R26.89)     Time: 8566-8550 PT Time Calculation (min) (ACUTE ONLY): 16 min  Charges:   PT Evaluation $PT Eval Low Complexity: 1 Low      [facility] PT Acute Rehabilitation Services Office [phone]   [place] ORN [facility]  01/14/2024, 3:26 PM

## 2024-01-14 NOTE — ED Triage Notes (Addendum)
 Pt from home via EMS.  Pt had son call ambulance due to L eye vision looking like lightning and then losing vision in L eye completely.  Pt reports Hx of stroke.    LKN 0130  Pt seen at bridge by MD and neurologist.  Pt went to CT.  Pt endorses drinking 6-7 drinks tonight with 2-3 drinks being normal per day.

## 2024-01-14 NOTE — Progress Notes (Signed)
 CSW added substance abuse resources to patient's AVS.  Edwin Dada, MSW, LCSW Transitions of Care  Clinical Social Worker II [phone]

## 2024-01-14 NOTE — Discharge Instructions (Signed)

## 2024-01-14 NOTE — ED Notes (Signed)
 Patient in MRI

## 2024-01-14 NOTE — Progress Notes (Signed)
 SLP Cancellation Note  Patient Details Name: Macarthur Lorusso MRN: [id number] DOB: January 13, 1956   Cancelled treatment:       Reason Eval/Treat Not Completed: Other (comment). MRI negative, MD suspects CRAO. No report of speech or cognitive deficits. Will defer SLP eval at this time.    Agamjot Kilgallon, Consuelo Fitch 01/14/2024, 8:11 AM

## 2024-01-14 NOTE — Progress Notes (Signed)
*  PRELIMINARY RESULTS* Echocardiogram 2D Echocardiogram has been performed.  Patrick Padilla Stallion 01/14/2024, 1:16 PM

## 2024-02-07 ENCOUNTER — Ambulatory Visit: Payer: Medicare (Managed Care) | Admitting: Adult Health

## 2024-02-07 ENCOUNTER — Encounter: Payer: Self-pay | Admitting: Adult Health

## 2024-02-07 ENCOUNTER — Telehealth: Payer: Self-pay | Admitting: Adult Health

## 2024-02-07 VITALS — BP 139/89 | HR 87 | Ht 72.0 in | Wt 202.0 lb

## 2024-02-07 DIAGNOSIS — E538 Deficiency of other specified B group vitamins: Secondary | ICD-10-CM

## 2024-02-07 DIAGNOSIS — H538 Other visual disturbances: Secondary | ICD-10-CM | POA: Diagnosis not present

## 2024-02-07 DIAGNOSIS — Z720 Tobacco use: Secondary | ICD-10-CM

## 2024-02-07 DIAGNOSIS — Z8673 Personal history of transient ischemic attack (TIA), and cerebral infarction without residual deficits: Secondary | ICD-10-CM

## 2024-02-07 NOTE — Telephone Encounter (Signed)
 Referral for Ophthalmology faxed to [facility] Phone:[phone] Fax:[phone]

## 2024-02-07 NOTE — Progress Notes (Signed)
 [facility] [address] [place]. [place] [address] [phone]       STROKE FOLLOW UP NOTE  [name] Date of Birth:  29-Jun-1956 Medical Record Number:  [id number]    Reason for visit: Hospital stroke follow-up    SUBJECTIVE:   CHIEF COMPLAINT:  Chief Complaint  Patient presents with   Cerebrovascular Accident    Rm 3 alone Pt is well, reports residual impaired vision. No other concerns.     HPI:   Update 02/07/2024 JM: Patient returns for follow-up visit for hospital follow-up.  He was last seen 09/2021 and advised to follow-up as needed is overall stable.  He returned to [facility] ED on 01/14/2024 with left eye visual disturbance.  MRI negative for acute stroke.  CTA head/neck negative LVO, left M2 and right P2 stenosis.  EF 55 to 60%.  Possible etiology ocular migraine with visual disturbance vs TIA due to A-fib not taking Eliquis  as prescribed.  Noted taking only once daily PTA and advised to take 5 mg twice daily.  LDL 114, added atorvastatin  40 mg daily.  A1c 5.5.  ESR 2, CRP 2.3.  Tobacco cessation counseling provided.  B12 deficiency at 156 and started supplement.  Per neurology note, visual symptoms resolved and discharged home without therapy.  Since discharge, no new stroke/TIA symptoms.  He does feel he continues to have decreased vision from left eye, does not seem to be as sharp as right eye.  He has not been seen by an ophthalmologist.  He has returned back to all prior activities without difficulty. Reports chronic gait impairment from prior stroke, hx of left hip replacement and prior injury to right ankle requiring screws. Denies any falls but is careful with his walking.  Ambulates without AD.  Reports compliance on Eliquis  twice daily dosing and atorvastatin  without side effects.  He has follow-up visit with PCP in September.  Continued tobacco use but reports decreased amount, down to 1.5 PPD from 3 PPD. Continued EtOH use, reporting 1-2 drinks of  liquor per night.  He has not yet started B12 supplement but plans on obtaining this today.      History provided for reference purposes only Update 09/21/2021 JM: Patient returns for 4-month stroke follow-up.  Overall doing well reporting excellent recovery working with therapies. Continue mild weakness on left side more noticeable when going up stairs, ambulating on uneven surfaces or prolonged ambulation although notes continued gradual improvement.  Reports great improvement of left shoulder pain and improved range of motion.  Continues to work with therapies.  Denies new stroke/TIA symptoms.  Compliant on Eliquis  and atorvastatin , denies side effects.  Blood pressure today elevated but has not yet taken BP meds today.  No new concerns at this time.  Initial visit 05/23/2021 JM: Patient being seen for hospital follow-up unaccompanied.  Doing well since discharge currently working with therapies with gradual recovery. Reports continued mild left sided weakness, left shoulder pain, lower back pain - pain gradually improving working with therapies. Occasional dizziness sensation upon standing. Living with son, daughter-in-law and grandbaby - arrangement PTA. He requests ability to return back to driving.  Denies new stroke/TIA symptoms.  Compliant on Eliquis  and atorvastatin  -denies side effects.  Blood pressure today 106/61. Recent med adjustments by PCP for hypotension (decreased amlodipine  dosage from 10 mg to 5 mg daily).  No further concerns at this time  Stroke admission note 322 Patrick Padilla is a 68 y.o. patient with PMHx of A. fib s/p CVA  in the past, and EtOH abuse who was admitted on 03/12/2021 with confusion, chest pain and diaphoresis.  He was found to be in A. fib with RVR in route to ED via EMG.  At admission he was hemiplegic, obtunded with hypoxic respiratory failure requiring intubation and right-sided jerking movements.  Personally reviewed hospitalization pertinent progress notes, lab  work and imaging.  Evaluated by Dr. Jerri for right MCA stroke due to proximal right M1/MCA occlusion s/p IR with TICI 3 reperfusion, embolic likely due to A. Fib s/p cardioversion. MR brain also showed PRES appearance in bilateral parieto-occipital cortex.  EF 60 to 65%.  EEG no seizure or epileptiform discharges. LTM EEG moderate diffuse encephalopathy.  LDL 97.  A1c 5.2.  Unclear if on Eliquis  PTA - recommended starting Eliquis  5 mg twice daily for stroke prevention and atrial fibrillation.  Initiate atorvastatin  40 mg daily.  EtOH and tobacco use with cessation counseling provided.  Residual deficits of left-sided weakness. D/c'd to CIR for ongoing therapy needs .      PERTINENT IMAGING/LABS   CT Possible subtle loss of gray-white differentiation in R lentiform nucleus and bilateral thalami  CTA head & neck Emergent LVO of the right M1 segment with poor collaterals. CT perfusion: R MCA infarct core 11ml with penumbra of . IR 9/3 Proximal right M1/MCA occlusion s/p mechanical thrombectomy with TICI3) MRI Confluent acute infarct at the right basal ganglia. Few and punctate bifrontal peripheral infarcts. Recent medial left thalamic infarct which may be subacute. PRES appearance in the bilateral parietooccipital cortex. Small volume blood products at the right sylvian fissure without evidence of progression from prior CT. 2D Echo EF 60-65%  EEG spot: no seizure or seizure predisposition recorded on this study.  LTM EEG This study is suggestive of moderate diffuse encephalopathy LDL 97 HgbA1c 5.2    ROS:   14 system review of systems performed and negative with exception of those listed in HPI  PMH:  Past Medical History:  Diagnosis Date   Anxiety    Elevated LFTs    History of colon polyps    HLD (hyperlipidemia)    HTN (hypertension)    Localized superficial swelling, mass, or lump    Stroke (HCC)     PSH:  Past Surgical History:  Procedure Laterality Date   CARDIOVERSION  N/A 02/05/2019   Procedure: CARDIOVERSION;  Surgeon: Delford Maude BROCKS, MD;  Location: [facility] ENDOSCOPY;  Service: Cardiovascular;  Laterality: N/A;   CERVICAL DISC SURGERY     IR CT HEAD LTD  03/12/2021   IR PERCUTANEOUS ART THROMBECTOMY/INFUSION INTRACRANIAL INC DIAG ANGIO  03/12/2021   IR US  GUIDE VASC ACCESS RIGHT  03/15/2021   LEG SURGERY     right leg and ankle   NECK SURGERY     cyst removed   RADIOLOGY WITH ANESTHESIA N/A 03/12/2021   Procedure: RADIOLOGY WITH ANESTHESIA;  Surgeon: Radiologist, Medication, MD;  Location: [facility] OR;  Service: Radiology;  Laterality: N/A;   TEE WITHOUT CARDIOVERSION N/A 02/05/2019   Procedure: TRANSESOPHAGEAL ECHOCARDIOGRAM (TEE);  Surgeon: Delford Maude BROCKS, MD;  Location: [facility] ENDOSCOPY;  Service: Cardiovascular;  Laterality: N/A;   TOTAL HIP ARTHROPLASTY     left    Social History:  Social History   Socioeconomic History   Marital status: Married    Spouse name: Not on file   Number of children: 2   Years of education: Not on file   Highest education level: Not on file  Occupational History   Occupation: disabled  Employer: SELF-EMPLOYED  Tobacco Use   Smoking status: Every Day   Smokeless tobacco: Never  Vaping Use   Vaping status: Never Used  Substance and Sexual Activity   Alcohol use: Yes    Comment: 3-4 a day   Drug use: No   Sexual activity: Not on file  Other Topics Concern   Not on file  Social History Narrative   Not on file   Social Drivers of Health   Financial Resource Strain: Low Risk  (03/21/2023)   Overall Financial Resource Strain (CARDIA)    Difficulty of Paying Living Expenses: Not very hard  Food Insecurity: Food Insecurity Present (03/21/2023)   Hunger Vital Sign    Worried About Running Out of Food in the Last Year: Sometimes true    Ran Out of Food in the Last Year: Sometimes true  Transportation Needs: No Transportation Needs (03/21/2023)   PRAPARE - Administrator, Civil Service (Medical): No    Lack of  Transportation (Non-Medical): No  Physical Activity: Inactive (03/21/2023)   Exercise Vital Sign    Days of Exercise per Week: 0 days    Minutes of Exercise per Session: 0 min  Stress: No Stress Concern Present (03/21/2023)   [employer] of Occupational Health - Occupational Stress Questionnaire    Feeling of Stress : Not at all  Social Connections: Unknown (03/21/2023)   Social Connection and Isolation Panel    Frequency of Communication with Friends and Family: Three times a week    Frequency of Social Gatherings with Friends and Family: Three times a week    Attends Religious Services: More than 4 times per year    Active Member of Clubs or Organizations: No    Attends Banker Meetings: Never    Marital Status: Not on file  Intimate Partner Violence: Not At Risk (03/21/2023)   Humiliation, Afraid, Rape, and Kick questionnaire    Fear of Current or Ex-Partner: No    Emotionally Abused: No    Physically Abused: No    Sexually Abused: No    Family History:  Family History  Problem Relation Age of Onset   Hepatitis Mother    Clotting disorder Mother    Stroke Mother    Thyroid disease Mother    Colon cancer Father        ?   Rheumatic fever Brother    Heart disease Brother     Medications:   Current Outpatient Medications on File Prior to Visit  Medication Sig Dispense Refill   apixaban  (ELIQUIS ) 5 MG TABS tablet Take 1 tablet (5 mg total) by mouth 2 (two) times daily. 180 tablet 0   atorvastatin  (LIPITOR) 40 MG tablet Take 1 tablet (40 mg total) by mouth daily. 90 tablet 0   cyanocobalamin  1000 MCG tablet Take 1 tablet (1,000 mcg total) by mouth daily. 90 tablet 0   folic acid  (FOLVITE ) 1 MG tablet Take 1 tablet (1 mg total) by mouth daily. 90 tablet 0   methocarbamol  (ROBAXIN ) 500 MG tablet Take 1 tablet (500 mg total) by mouth at bedtime as needed for muscle spasms. 30 tablet 0   naproxen sodium (ALEVE) 220 MG tablet Take 220 mg by mouth daily as needed  (pain).     PARoxetine  (PAXIL ) 30 MG tablet Take 1 tablet (30 mg total) by mouth daily. 90 tablet 0   telmisartan  (MICARDIS ) 40 MG tablet Take 1 tablet (40 mg total) by mouth daily. 90 tablet 0  No current facility-administered medications on file prior to visit.    Allergies:  No Known Allergies    OBJECTIVE:  Physical Exam  Vitals:   02/07/24 1020  BP: 139/89  Pulse: 87  Weight: 202 lb (91.6 kg)  Height: 6' (1.829 m)    Body mass index is 27.4 kg/m. No results found.  General: well developed, well nourished, very pleasant middle-aged Caucasian male, seated, in no evident distress  Neurologic Exam Mental Status: Awake and fully alert. Fluent speech and language. Oriented to place and time. Recent and remote memory intact. Attention span, concentration and fund of knowledge appropriate. Mood and affect appropriate.  Cranial Nerves: Pupils equal, briskly reactive to light. Extraocular movements full without nystagmus. Visual fields full to confrontation. Hearing intact. Facial sensation intact. Face, tongue, palate moves normally and symmetrically.  Motor: Normal bulk and tone. Normal strength in all tested extremity muscles; unable to appreciate left sided weakness Sensory.: intact to touch , pinprick , position and vibratory sensation  Coordination: Rapid alternating movements normal in all extremities. Finger-to-nose and heel-to-shin performed accurately bilaterally. Gait and Station: Arises from chair without difficulty. Stance is normal. Gait demonstrates decreased stride length and step height of LLE with increased stiffness.  No use of AD. Reflexes: 1+ and symmetric. Toes downgoing.          ASSESSMENT: Patrick Padilla is a 68 y.o. year old male with episode of transient monocular left eye vision loss on 01/13/2024 possibly due to ocular migraine vs TIA with inadequate dosing of Eliquis  (daily daily vs BID). Hx of right MCA stroke on 03/12/2021 in setting of proximal right  M1/MCA occlusion s/p IR with TICI 3 reperfusion, embolic likely due to A. Fib s/p cardioversion. Vascular risk factors include A. 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 Chronic gait impairment stable.  Discussed fall precautions.   Continue Eliquis  5mg  twice daily and atorvastatin  40 mg daily for secondary stroke prevention/prescribed by PCP/cardiology Discussed secondary stroke prevention measures and importance of close PCP follow up for aggressive stroke risk factor management including HTN with BP goal<130/90 and HLD with LDL goal<70.  Stroke labs 01/2024 LDL 114, A1c 5.5 I have gone over the pathophysiology of stroke, warning signs and symptoms, risk factors and their management in some detail with instructions to go to the closest emergency room for symptoms of concern.  Tobacco use:  EtOH use:  Has been working on gradually reducing daily amount.  Discussed importance of complete tobacco cessation and continued use increases risk of additional strokes Advised to limit ETOH use especially with concurrent use of Eliquis   B12 deficiency:  Vitamin B12 156 Advised to start B12 supplement 1000 mcg daily Follow-up with PCP for recheck in the next 2 to 3 months ongoing management    No further recommendations from neurological standpoint and is closely being followed by PCP.  He can follow-up on an as-needed basis but advised to call with any questions or concerns in the future    CC:  PCP: Tanda Bleacher, MD    I personally spent a total of 60 minutes in the care of the patient today including preparing to see the patient, performing a medically appropriate exam/evaluation, counseling and educating, placing orders, and documenting clinical information  in the  EHR.  Time also spent extensively reviewing recent hospitalization.   Harlene Bogaert, AGNP-BC  [facility] [address] [place], [place] [address]  Phone [phone] Fax [phone] Note: This document was prepared with digital dictation and possible smart phrase technology. Any transcriptional errors that result from this process are unintentional.

## 2024-02-07 NOTE — Patient Instructions (Addendum)
 Will place referral to ophthalmology for further evaluation and to ensure no underlying ophthalmic conditions contributing   Continue Eliquis  5 mg twice daily  and atorvastatin  40 mg daily for secondary stroke prevention  Highly encourage complete tobacco cessation as continued use greatly increases risk of additional strokes and avoiding alcohol use especially with concurrent use of Eliquis   Start Vitamin B12 supplement - take 1000mcg daily, ensure follow-up with PCP for ongoing monitoring  Continue to follow up with PCP regarding blood pressure and cholesterol management  Maintain strict control of hypertension with blood pressure goal below 130/90, diabetes with hemoglobin A1c goal below 7.0 % and cholesterol with LDL cholesterol (bad cholesterol) goal below 70 mg/dL.   Signs of a Stroke? Follow the BEFAST method:  Balance Watch for a sudden loss of balance, trouble with coordination or vertigo Eyes Is there a sudden loss of vision in one or both eyes? Or double vision?  Face: Ask the person to smile. Does one side of the face droop or is it numb?  Arms: Ask the person to raise both arms. Does one arm drift downward? Is there weakness or numbness of a leg? Speech: Ask the person to repeat a simple phrase. Does the speech sound slurred/strange? Is the person confused ? Time: If you observe any of these signs, call 911.        Thank you for coming to see us  at [facility]. I hope we have been able to provide you high quality care today.  You may receive a patient satisfaction survey over the next few weeks. We would appreciate your feedback and comments so that we may continue to improve ourselves and the health of our patients.

## 2024-02-08 NOTE — Progress Notes (Signed)
 I agree with the above plan

## 2024-04-08 ENCOUNTER — Ambulatory Visit (INDEPENDENT_AMBULATORY_CARE_PROVIDER_SITE_OTHER): Payer: Medicare (Managed Care) | Admitting: Family Medicine

## 2024-04-08 VITALS — Ht 72.0 in | Wt 200.0 lb

## 2024-04-08 DIAGNOSIS — F419 Anxiety disorder, unspecified: Secondary | ICD-10-CM

## 2024-04-08 DIAGNOSIS — G8929 Other chronic pain: Secondary | ICD-10-CM

## 2024-04-08 DIAGNOSIS — E785 Hyperlipidemia, unspecified: Secondary | ICD-10-CM | POA: Diagnosis not present

## 2024-04-08 DIAGNOSIS — G4700 Insomnia, unspecified: Secondary | ICD-10-CM

## 2024-04-08 DIAGNOSIS — I10 Essential (primary) hypertension: Secondary | ICD-10-CM

## 2024-04-08 DIAGNOSIS — Z79899 Other long term (current) drug therapy: Secondary | ICD-10-CM

## 2024-04-08 DIAGNOSIS — M545 Low back pain, unspecified: Secondary | ICD-10-CM | POA: Diagnosis not present

## 2024-04-08 MED ORDER — TRIAMCINOLONE ACETONIDE 40 MG/ML IJ SUSP
40.0000 mg | Freq: Once | INTRAMUSCULAR | Status: AC
  Administered 2024-04-08: 40 mg via INTRAMUSCULAR

## 2024-04-08 NOTE — Progress Notes (Unsigned)
 Established Patient Office Visit  Subjective    Patient ID: Patrick Padilla, male    DOB: Feb 23, 1956  Age: 68 y.o. MRN: [id number]  CC:  Chief Complaint  Patient presents with   Medical Management of Chronic Issues    Pt reports frequent back pains     HPI Lorris Carducci presents for routine follow up for hypertension and anxiety. Patient also reports an exacerbation of chronic back pain. He denies known trauma or injury.   Outpatient Encounter Medications as of 04/08/2024  Medication Sig   apixaban  (ELIQUIS ) 5 MG TABS tablet Take 1 tablet (5 mg total) by mouth 2 (two) times daily.   atorvastatin  (LIPITOR) 40 MG tablet Take 1 tablet (40 mg total) by mouth daily.   cyanocobalamin  1000 MCG tablet Take 1 tablet (1,000 mcg total) by mouth daily.   folic acid  (FOLVITE ) 1 MG tablet Take 1 tablet (1 mg total) by mouth daily.   methocarbamol  (ROBAXIN ) 500 MG tablet Take 1 tablet (500 mg total) by mouth at bedtime as needed for muscle spasms.   naproxen sodium (ALEVE) 220 MG tablet Take 220 mg by mouth daily as needed (pain).   PARoxetine  (PAXIL ) 30 MG tablet Take 1 tablet (30 mg total) by mouth daily.   telmisartan  (MICARDIS ) 40 MG tablet Take 1 tablet (40 mg total) by mouth daily.   [EXPIRED] triamcinolone  acetonide (KENALOG -40) injection 40 mg    No facility-administered encounter medications on file as of 04/08/2024.    Past Medical History:  Diagnosis Date   Anxiety    Elevated LFTs    History of colon polyps    HLD (hyperlipidemia)    HTN (hypertension)    Localized superficial swelling, mass, or lump    Stroke [facility])     Past Surgical History:  Procedure Laterality Date   CARDIOVERSION N/A 02/05/2019   Procedure: CARDIOVERSION;  Surgeon: Delford Maude BROCKS, MD;  Location: [facility] ENDOSCOPY;  Service: Cardiovascular;  Laterality: N/A;   CERVICAL DISC SURGERY     IR CT HEAD LTD  03/12/2021   IR PERCUTANEOUS ART THROMBECTOMY/INFUSION INTRACRANIAL INC DIAG ANGIO  03/12/2021   IR US  GUIDE VASC  ACCESS RIGHT  03/15/2021   LEG SURGERY     right leg and ankle   NECK SURGERY     cyst removed   RADIOLOGY WITH ANESTHESIA N/A 03/12/2021   Procedure: RADIOLOGY WITH ANESTHESIA;  Surgeon: Radiologist, Medication, MD;  Location: [facility] OR;  Service: Radiology;  Laterality: N/A;   TEE WITHOUT CARDIOVERSION N/A 02/05/2019   Procedure: TRANSESOPHAGEAL ECHOCARDIOGRAM (TEE);  Surgeon: Delford Maude BROCKS, MD;  Location: [facility] ENDOSCOPY;  Service: Cardiovascular;  Laterality: N/A;   TOTAL HIP ARTHROPLASTY     left    Family History  Problem Relation Age of Onset   Hepatitis Mother    Clotting disorder Mother    Stroke Mother    Thyroid disease Mother    Colon cancer Father        ?   Rheumatic fever Brother    Heart disease Brother     Social History   Socioeconomic History   Marital status: Married    Spouse name: Not on file   Number of children: 2   Years of education: Not on file   Highest education level: Not on file  Occupational History   Occupation: disabled    Employer: SELF-EMPLOYED  Tobacco Use   Smoking status: Every Day   Smokeless tobacco: Never  Vaping Use   Vaping status: Never Used  Substance and Sexual Activity   Alcohol use: Yes    Comment: 3-4 a day   Drug use: No   Sexual activity: Not on file  Other Topics Concern   Not on file  Social History Narrative   Not on file   Social Drivers of Health   Financial Resource Strain: Low Risk  (03/21/2023)   Overall Financial Resource Strain (CARDIA)    Difficulty of Paying Living Expenses: Not very hard  Food Insecurity: Food Insecurity Present (03/21/2023)   Hunger Vital Sign    Worried About Running Out of Food in the Last Year: Sometimes true    Ran Out of Food in the Last Year: Sometimes true  Transportation Needs: No Transportation Needs (03/21/2023)   PRAPARE - Administrator, Civil Service (Medical): No    Lack of Transportation (Non-Medical): No  Physical Activity: Inactive (03/21/2023)   Exercise  Vital Sign    Days of Exercise per Week: 0 days    Minutes of Exercise per Session: 0 min  Stress: No Stress Concern Present (03/21/2023)   [employer] of Occupational Health - Occupational Stress Questionnaire    Feeling of Stress : Not at all  Social Connections: Unknown (03/21/2023)   Social Connection and Isolation Panel    Frequency of Communication with Friends and Family: Three times a week    Frequency of Social Gatherings with Friends and Family: Three times a week    Attends Religious Services: More than 4 times per year    Active Member of Clubs or Organizations: No    Attends Banker Meetings: Never    Marital Status: Not on file  Intimate Partner Violence: Not At Risk (03/21/2023)   Humiliation, Afraid, Rape, and Kick questionnaire    Fear of Current or Ex-Partner: No    Emotionally Abused: No    Physically Abused: No    Sexually Abused: No    Review of Systems  All other systems reviewed and are negative.       Objective    Ht 6' (1.829 m)   Wt 200 lb (90.7 kg)   BMI 27.12 kg/m   Physical Exam Vitals and nursing note reviewed.  Constitutional:      General: He is not in acute distress. HENT:     Right Ear: Decreased hearing noted.     Left Ear: Decreased hearing noted.  Cardiovascular:     Rate and Rhythm: Normal rate and regular rhythm.  Pulmonary:     Effort: Pulmonary effort is normal.     Breath sounds: Normal breath sounds.  Abdominal:     Palpations: Abdomen is soft.     Tenderness: There is no abdominal tenderness.  Musculoskeletal:     Right lower leg: No edema.     Left lower leg: No edema.  Neurological:     General: No focal deficit present.     Mental Status: He is alert and oriented to person, place, and time.  Psychiatric:        Mood and Affect: Mood normal.         Assessment & Plan:   Essential hypertension  Hyperlipidemia, unspecified hyperlipidemia type  Anxiety  Chronic left-sided low back pain,  unspecified whether sciatica present -     Triamcinolone  Acetonide  Insomnia, unspecified type     No follow-ups on file.   Tanda Raguel SQUIBB, MD

## 2024-04-09 ENCOUNTER — Encounter: Payer: Self-pay | Admitting: Family Medicine

## 2024-05-12 ENCOUNTER — Encounter: Payer: Self-pay | Admitting: Radiology

## 2024-05-27 ENCOUNTER — Other Ambulatory Visit: Payer: Self-pay | Admitting: Family Medicine

## 2024-05-27 NOTE — Telephone Encounter (Unsigned)
 Copied from CRM [id number]. Topic: Clinical - Medication Question >> May 27, 2024  2:22 PM Donee H wrote: Reason for CRM: Patient called to state need refill on medication atorvastatin  (LIPITOR) 40 MG tablet and telmisartan  (MICARDIS ) 40 MG tablet. Preferred pharmacy is   [facility] [address] ([address]), [place] - [address],  ([place]) [place] [address] Phone: [phone]  Fax: [phone]

## 2024-05-29 ENCOUNTER — Other Ambulatory Visit: Payer: Self-pay

## 2024-05-29 ENCOUNTER — Other Ambulatory Visit: Payer: Self-pay | Admitting: Family Medicine

## 2024-05-29 NOTE — Addendum Note (Signed)
 Addended by: Lamerle Jabs E on: 05/29/2024 11:22 AM   Modules accepted: Orders

## 2024-05-29 NOTE — Telephone Encounter (Signed)
 Pt called asking about status of prescription refills for atorvastatin  and telmisartan . Pt is completely out and is worried about going on without his BP medication. Does pt need appt? Please advise.

## 2024-05-29 NOTE — Telephone Encounter (Signed)
 Patient checking on the status of 05/27/2024 medication refill request. Patient states he's completely out and would like a follow up call today.

## 2024-05-30 MED ORDER — ATORVASTATIN CALCIUM 40 MG PO TABS: 40.0000 mg | ORAL_TABLET | Freq: Every day | ORAL | 0 refills | Status: AC

## 2024-05-30 MED ORDER — TELMISARTAN 40 MG PO TABS: 40.0000 mg | ORAL_TABLET | Freq: Every day | ORAL | 0 refills | Status: AC

## 2024-06-10 ENCOUNTER — Telehealth: Payer: Self-pay

## 2024-06-10 ENCOUNTER — Telehealth: Payer: Self-pay | Admitting: Family Medicine

## 2024-06-10 NOTE — Telephone Encounter (Unsigned)
 Copied from CRM [id number]. Topic: Clinical - Medication Refill >> Jun 10, 2024  3:18 PM Antwanette L wrote: Medication: PARoxetine  (PAXIL ) 30 MG tablet  Has the patient contacted their pharmacy? Yes.   This is the patient's preferred pharmacy:  [facility] [address] ([address] [place] - [address] [place] ([place]) [place] [address] Phone: [phone] Fax: [phone]    Is this the correct pharmacy for this prescription? Yes   Has the prescription been filled recently? Yes. Last refill was on 01/14/24  Is the patient out of the medication? Yes  Has the patient been seen for an appointment in the last year OR does the patient have an upcoming appointment? Yes. Last ov with Dr. Tanda was on 04/08/24  Can we respond through MyChart? Yes and by phone at [phone]  Agent: Please be advised that Rx refills may take up to 3 business days. We ask that you follow-up with your pharmacy.

## 2024-06-10 NOTE — Telephone Encounter (Signed)
 Copied from CRM [id number]. Topic: Clinical - Medication Refill >> Jun 10, 2024  3:18 PM Antwanette L wrote: Medication: PARoxetine  (PAXIL ) 30 MG tablet  Has the patient contacted their pharmacy? Yes.   This is the patient's preferred pharmacy:  [facility] [address] ([address]), [place] - [address] [place] ([place]) [place] [address] Phone: [phone] Fax: [phone]    Is this the correct pharmacy for this prescription? Yes   Has the prescription been filled recently? Yes. Last refill was on 01/14/24  Is the patient out of the medication? Yes  Has the patient been seen for an appointment in the last year OR does the patient have an upcoming appointment? Yes. Last ov with Dr. Tanda was on 04/08/24  Can we respond through MyChart? Yes and by phone at [phone]  Agent: Please be advised that Rx refills may take up to 3 business days. We ask that you follow-up with your pharmacy. >> Jun 10, 2024  3:24 PM Antwanette L wrote: The patient was seen on 01/14/24 in the [facility] Emergency Department at [facility]. Dr. Eric Austria provided the most recent medication refill at that visit.

## 2024-06-11 ENCOUNTER — Telehealth: Payer: Self-pay | Admitting: Family Medicine

## 2024-06-11 NOTE — Telephone Encounter (Signed)
 Copied from CRM [id number]. Topic: Clinical - Medication Refill >> Jun 10, 2024  3:18 PM Antwanette L wrote: Medication: PARoxetine  (PAXIL ) 30 MG tablet  Has the patient contacted their pharmacy? Yes.   This is the patient's preferred pharmacy:  [facility] [address] ([address] [place] - [address] [place] ([place]) [place] [address] Phone: [phone] Fax: [phone]    Is this the correct pharmacy for this prescription? Yes   Has the prescription been filled recently? Yes. Last refill was on 01/14/24  Is the patient out of the medication? Yes  Has the patient been seen for an appointment in the last year OR does the patient have an upcoming appointment? Yes. Last ov with Dr. Tanda was on 04/08/24  Can we respond through MyChart? Yes and by phone at [phone]  Agent: Please be advised that Rx refills may take up to 3 business days. We ask that you follow-up with your pharmacy. >> Jun 11, 2024  3:23 PM Zebedee SAUNDERS wrote: Pt was seen by Dr. Tanda on 04/08/2024 calling regarding his anxiety medication PARoxetine  (PAXIL ) 30 MG tablet.  >> Jun 10, 2024  3:24 PM Antwanette L wrote: The patient was seen on 01/14/24 in the [facility] Emergency Department at [facility]. Dr. Eric Austria provided the most recent medication refill at that visit.

## 2024-06-12 ENCOUNTER — Ambulatory Visit: Payer: Self-pay

## 2024-06-12 ENCOUNTER — Other Ambulatory Visit: Payer: Self-pay | Admitting: Family Medicine

## 2024-06-12 MED ORDER — PAROXETINE HCL 30 MG PO TABS: 30.0000 mg | ORAL_TABLET | Freq: Every day | ORAL | 0 refills | Status: AC

## 2024-06-12 NOTE — Telephone Encounter (Signed)
 FYI Only or Action Required?: Action required by provider: medication refill request. Called 3 days in a row, requesting call back today if unable to fill.   Patient was last seen in primary care on 04/08/2024 by Tanda Bleacher, MD.  Called Nurse Triage reporting Medication Refill.  Symptoms began n/a.  Interventions attempted: Other: calling for refill X 3 days.  Symptoms are: increasing anxiety due to out of med.  Triage Disposition: Call PCP When Office is Open  Patient/caregiver understands and will follow disposition?: No, wishes to speak with PCP  Copied from CRM #[id number]. Topic: Clinical - Red Word Triage >> Jun 12, 2024 11:52 AM Rosaria BRAVO wrote: Red Word that prompted transfer to Nurse Triage: Anxiety, missing anxiety medication Reason for Disposition  [1] Prescription refill request for NON-ESSENTIAL medicine (i.e., no harm to patient if med not taken) AND [2] triager unable to refill per department policy  Answer Assessment - Initial Assessment Questions Patient reports he has been calling for 3 days for Paxil  refill and still has not received it yet. This clinical research associate did advise patient of 3 day TAT. Patient is reporting he feels anxiety increasing due to being out of med. No new or concerning symptoms reported.  Requesting for Paxil  refill today to [facility]. Please call patient if unable to fill medication.     1. DRUG NAME: What medicine do you need to have refilled?     Paxil  2. REFILLS REMAINING: How many refills are remaining? Notes: The label on the medicine or pill bottle will show how many refills are remaining. If there are no refills remaining, then a renewal may be needed.      3. EXPIRATION DATE: What is the expiration date? Note: The label states when the prescription will expire, and thus can no longer be refilled.)      4. PRESCRIBER: Who prescribed it? Note: The prescribing doctor or group is responsible for refill approvals..      5. PHARMACY: Have  you contacted your pharmacy (drugstore)? Note: Some pharmacies will contact the doctor (or NP/PA).      [facility].  6. SYMPTOMS: Do you have any symptoms?     Anxiety  7. PREGNANCY: Is there any chance that you are pregnant? When was your last menstrual period?  Protocols used: Medication Refill and Renewal Call-A-AH

## 2024-06-12 NOTE — Addendum Note (Signed)
 Addended by: GRETTA LAURAINE HERO on: 06/12/2024 11:58 AM   Modules accepted: Orders

## 2024-06-12 NOTE — Telephone Encounter (Signed)
 patient called again,waiting on status of refill, says doesn't want to start getting il with family without his med

## 2024-06-12 NOTE — Telephone Encounter (Signed)
 Pt called reporting that he is completely out of his current supply of this medication. Says PCP prescribed this to him prior to being seen in the ED back in July. Please advise

## 2024-07-11 ENCOUNTER — Other Ambulatory Visit: Payer: Self-pay | Admitting: Family Medicine

## 2024-07-11 NOTE — Telephone Encounter (Signed)
 Copied from CRM [id number]. Topic: Clinical - Medication Refill >> Jul 11, 2024  3:30 PM Shanda MATSU wrote: Medication: apixaban  (ELIQUIS ) 5 MG TABS tablet  Has the patient contacted their pharmacy? Yes, referred to provider (Agent: If no, request that the patient contact the pharmacy for the refill. If patient does not wish to contact the pharmacy document the reason why and proceed with request.) (Agent: If yes, when and what did the pharmacy advise?)  This is the patient's preferred pharmacy:  [facility] [address] ([address] [place] - [address] [place] ([place]) [place] [address] Phone: [phone] Fax: [phone]  Is this the correct pharmacy for this prescription? Yes If no, delete pharmacy and type the correct one.   Has the prescription been filled recently? No  Is the patient out of the medication? No  Has the patient been seen for an appointment in the last year OR does the patient have an upcoming appointment? Yes  Can we respond through MyChart? Yes  Agent: Please be advised that Rx refills may take up to 3 business days. We ask that you follow-up with your pharmacy.

## 2024-07-14 NOTE — Telephone Encounter (Signed)
 Requested medication (s) are due for refill today: yes  Requested medication (s) are on the active medication list: yes  Last refill:  01/14/24  Future visit scheduled: yes  Notes to clinic:  Unable to refill per protocol, last refill by another provider.      Requested Prescriptions  Pending Prescriptions Disp Refills   apixaban  (ELIQUIS ) 5 MG TABS tablet 180 tablet 0    Sig: Take 1 tablet (5 mg total) by mouth 2 (two) times daily.     Hematology:  Anticoagulants - apixaban  Failed - 07/14/2024 10:53 AM      Failed - AST in normal range and within 360 days    AST  Date Value Ref Range Status  01/14/2024 13 (L) 15 - 41 U/L Final         Passed - PLT in normal range and within 360 days    Platelets  Date Value Ref Range Status  01/14/2024 322 150 - 400 K/uL Final         Passed - HGB in normal range and within 360 days    Hemoglobin  Date Value Ref Range Status  01/14/2024 16.9 13.0 - 17.0 g/dL Final         Passed - HCT in normal range and within 360 days    HCT  Date Value Ref Range Status  01/14/2024 49.0 39.0 - 52.0 % Final         Passed - Cr in normal range and within 360 days    Creatinine, Ser  Date Value Ref Range Status  01/14/2024 1.04 0.61 - 1.24 mg/dL Final         Passed - ALT in normal range and within 360 days    ALT  Date Value Ref Range Status  01/14/2024 16 0 - 44 U/L Final         Passed - Valid encounter within last 12 months    Recent Outpatient Visits           3 months ago Essential hypertension   [place] [facility], MD   9 months ago Acute pain of left knee   [facility] [facility], MD   1 year ago Essential hypertension   [place] [facility], MD   1 year ago Encounter for Medicare annual wellness exam   [place] [facility], MD   1 year ago Essential hypertension   [place]  [facility], MD

## 2024-07-15 MED ORDER — APIXABAN 5 MG PO TABS: 5.0000 mg | ORAL_TABLET | Freq: Two times a day (BID) | ORAL | 0 refills | Status: AC
